# Patient Record
Sex: Male | Born: 1960 | Race: White | Hispanic: No | Marital: Married | Smoking: Former smoker
Health system: Southern US, Community
[De-identification: ages and names within clinical notes are randomized; demographics above are authoritative.]

## PROBLEM LIST (undated history)

## (undated) DIAGNOSIS — C159 Malignant neoplasm of esophagus, unspecified: Secondary | ICD-10-CM

## (undated) DIAGNOSIS — C760 Malignant neoplasm of head, face and neck: Secondary | ICD-10-CM

## (undated) DIAGNOSIS — I10 Essential (primary) hypertension: Secondary | ICD-10-CM

## (undated) HISTORY — PX: BACK SURGERY: SHX140

## (undated) HISTORY — PX: FOOT SURGERY: SHX648

## (undated) HISTORY — PX: LEG SURGERY: SHX1003

## (undated) HISTORY — DX: Malignant neoplasm of esophagus, unspecified: C15.9

## (undated) HISTORY — DX: Malignant neoplasm of head, face and neck: C76.0

---

## 1997-11-10 ENCOUNTER — Encounter: Admission: RE | Admit: 1997-11-10 | Discharge: 1998-02-08 | Payer: Self-pay | Admitting: Anesthesiology

## 2001-04-13 ENCOUNTER — Encounter: Payer: Self-pay | Admitting: Specialist

## 2001-04-13 ENCOUNTER — Encounter: Admission: RE | Admit: 2001-04-13 | Discharge: 2001-04-13 | Payer: Self-pay | Admitting: Specialist

## 2007-05-18 ENCOUNTER — Inpatient Hospital Stay (HOSPITAL_COMMUNITY): Admission: RE | Admit: 2007-05-18 | Discharge: 2007-05-23 | Payer: Self-pay | Admitting: Neurosurgery

## 2010-08-06 NOTE — Op Note (Signed)
NAMESEBASTIN, PERLMUTTER                 ACCOUNT NO.:  [id number]   MEDICAL RECORD NO.:  [id number]          PATIENT TYPE:  INP   LOCATION:  2899                         FACILITY:  [facility]   PHYSICIAN:  Danae Orleans. Venetia Maxon, M.D.  DATE OF BIRTH:  1960-04-11   DATE OF PROCEDURE:  05/18/2007  DATE OF DISCHARGE:                               OPERATIVE REPORT   PREOPERATIVE DIAGNOSES:  1. Herniated lumbar disk.  2. Spondylosis.  3. Degenerative disk disease.  4. Radiculopathy of L5-S1 level.   POSTOPERATIVE DIAGNOSES:  1. Herniated lumbar disk.  2. Spondylosis.  3. Degenerative disk disease.  4. Radiculopathy of L5-S1 level.   PROCEDURES PERFORMED:  1. Bilateral L5 laminectomies.  2. Diskectomy with posterior lumbar interbody fusion with PEEK      interbody cages, morcellized autograft FortrOss bone graft extender      and bone morphogenic protein.  3. Pedicle screw fixation (nonsegmental L5- S1 bilaterally).  4. Posterolateral arthrodesis with morcellized bone autograft,      FortrOss and bone morphogenic protein.   SURGEON:  Danae Orleans. Venetia Maxon, M.D.   ASSISTANT:  Georgiann Cocker, RN  Stefani Dama, M.D.   ANESTHESIA:  General endotracheal.   ESTIMATED BLOOD LOSS:  220 mL.   COMPLICATIONS:  None.   DISPOSITION:  To recovery.   INDICATIONS:  Newman Waren is a 50 year old man with intractable back and  bilateral lower extremity pain, right greater than left.  He has disk  herniation at L5-S1 with biforaminal spondylitic foraminal stenosis,  causing L5 radiculopathies.  It was elected to take him to surgery for  decompression and fusion at the L5-S1 level.   PROCEDURE:  Mr Decatur was brought to the operating room.  Following  satisfactory and uncomplicated induction of general endotracheal  anesthesia, we placed the intravenous lines and the Foley catheter.  The  patient was then placed in a prone position on the [place] table.  His  low back was then prepped and draped in the usual  sterile fashion.  The  area of planned incision was infiltrated with 0.2% Marcaine and 0.5%  lidocaine with 1:200,000 epinephrine.  The incision was made in the  midline and carried through to the lumbodorsal fascia; which was incised  bilaterally.  Subperiosteal dissection was performed, exposing the L5  through S1 interspace.  Marker probes were placed overlying the  transverse processes at L5 and the sacral alae bilaterally.  Self-  retaining retractor was placed to facilitate exposure; intraoperative x-  ray confirmed correct level.  Subsequently,  hemi-semi laminectomies of  L5 were then performed with a high-speed drill, included with Kerrison  rongeurs and Leksell rongeur; with removal of the inferior articular  facet of L5 bilaterally, and with laminectomies at L5.  The L5 nerve  roots were decompressed, as was the common dural tube; and the L5-S1  interspace was identified.  Then using a D'Errico nerve root retractor  to facilitate exposure, the interspace at L5-S1 was incised.  Disk  material was removed in piecemeal fashion.  Sequential spacers were then  placed up to a level of 11.  The right side was then taken down more  aggressively with a variety of endplate preparation curettes.  Then,  subsequently after trial sizing an 11-mm, PEEK interbody cages were  selected, packed with bone morphogenic protein along with reconstituted  FortrOss 10 mL.  These implants were then inserted in the interspace,  after a half a piece of small BMP kit was then was placed in the  interspace, covered with the FortrOss.  Then a similarly-sized cage was  placed in the left side of midline, packed in similar fashion and bone  autograft was packed overlying these implants.  After confirmation on  lateral fluoroscopy, pedicle screws were placed 50 mm x 6.5 mm at L5 and  S1 bilaterally; there was good purchase and positioning of the screws,  with no cutouts.  The 45 mm screws were used at the level  of the sacrum  on the left, because there did appear to be bicortical purchase up to 45  mm.  Subsequently, 15 mm preload rods were placed and locked down in  situ.  Prior to doing so, posterolateral region was decorticated on the  right side of the midline;  half a piece of the remaining small BMP kit  was placed, and the remaining FortrOss bone autograft was placed  overlying this.  It was tamped into position, similarly with FortrOss  and autograft being placed on the left side of midline and tamped into  position.  The wound had been irrigated prior to this bone graft.  Self-  retaining retractors were removed.  The lumbodorsal fascia was closed  with #1 Vicryl suture.  Subcutaneous tissues were approximated with 2-0  Vicryl inverted sutures, and the skin edges were approximated with  interrupted 3-0 Vicryl subcuticular stitch.  The wound was dressed with  Benzoin, Steri-Strips, Telfa gauze and tape.  The patient was extubated  in the operating room and taken to recovery room in stable satisfactory  condition, having tolerated the operation well.  Counts were correct at  the end of the case.      Danae Orleans. Venetia Maxon, M.D.  Electronically Signed     JDS/MEDQ  D:  05/18/2007  T:  05/19/2007  Job:  [id number]

## 2010-08-06 NOTE — Discharge Summary (Signed)
NAMEKIRK, BASQUEZ                 ACCOUNT NO.:  [id number]   MEDICAL RECORD NO.:  [id number]          PATIENT TYPE:  INP   LOCATION:  3036                         FACILITY:  [facility]   PHYSICIAN:  Stefani Dama, M.D.  DATE OF BIRTH:  Jun 18, 1960   DATE OF ADMISSION:  05/18/2007  DATE OF DISCHARGE:  05/23/2007                               DISCHARGE SUMMARY   ADMITTING DIAGNOSES:  1. Herniated nucleus pulposus lumbar spine.  2. Spondylosis.  3. Degenerative disk disease.  4. Radiculopathy of L5 and S1 nerve roots.   DISCHARGE AND FINAL DIAGNOSES:  1. Herniated nucleus pulposus lumbar.  2. Spondylosis.  3. Degenerative disk disease.  4. Radiculopathy of L5-S1 nerve roots.  5. Postoperative ileus.   CONDITION ON DISCHARGE:  Improving.   HOSPITAL COURSE:  Brandt Chaney is a 50 year old individual was had  significant back pain, lower extremity pain, is failing all forms of  conservative management and he was advised regarding decompression  arthrodesis which was performed on May 18, 2007.  Postoperatively,  he has been slow to ambulate but gradually increased his ambulatory  status.  He had some increasing back pain, complained of abdominal  distention, was found to have postoperative ileus.  This gradually  resolved with the use of some mild laxatives and encouragement of  ambulation.  At the time of discharge, the patient was using Percocet  for pain control.  In addition, he is given a prescription for Valium 5  mg to use every 8 hours, Percocet #60 is written, Valium 5 mg #40 is  written.  His incision is clean and dry.  He has been advised of his  postoperative care.  He will be seen by Dr. Maeola Harman in  approximately two weeks' time.   CONDITION ON DISCHARGE:  Improving.      Stefani Dama, M.D.  Electronically Signed     HJE/MEDQ  D:  05/23/2007  T:  05/24/2007  Job:  [id number]

## 2010-12-16 LAB — URINALYSIS, ROUTINE W REFLEX MICROSCOPIC
Nitrite: NEGATIVE
Specific Gravity, Urine: 1.015
pH: 5.5

## 2010-12-16 LAB — BASIC METABOLIC PANEL
BUN: 10
Calcium: 9.2
Creatinine, Ser: 0.76
GFR calc non Af Amer: >60
Potassium: 4

## 2010-12-16 LAB — DIFFERENTIAL
Basophils Absolute: 0.1
Lymphocytes Relative: 19
Monocytes Absolute: 1.2 — ABNORMAL HIGH
Monocytes Relative: 8
Neutro Abs: 11.1 — ABNORMAL HIGH
Neutrophils Relative %: 71

## 2010-12-16 LAB — CBC
Hemoglobin: 13.4
Platelets: 194
RBC: 4.26
RDW: 11.8
WBC: 7.2

## 2011-08-24 ENCOUNTER — Emergency Department (HOSPITAL_COMMUNITY)
Admission: EM | Admit: 2011-08-24 | Discharge: 2011-08-24 | Disposition: A | Discharge status: Home / Self Care | Payer: Self-pay | Attending: Emergency Medicine | Admitting: Emergency Medicine

## 2011-08-24 ENCOUNTER — Emergency Department (HOSPITAL_COMMUNITY): Payer: Self-pay

## 2011-08-24 ENCOUNTER — Encounter (HOSPITAL_COMMUNITY): Payer: Self-pay | Admitting: Emergency Medicine

## 2011-08-24 DIAGNOSIS — R079 Chest pain, unspecified: Secondary | ICD-10-CM | POA: Insufficient documentation

## 2011-08-24 DIAGNOSIS — R0781 Pleurodynia: Secondary | ICD-10-CM

## 2011-08-24 DIAGNOSIS — I10 Essential (primary) hypertension: Secondary | ICD-10-CM | POA: Insufficient documentation

## 2011-08-24 DIAGNOSIS — W108XXA Fall (on) (from) other stairs and steps, initial encounter: Secondary | ICD-10-CM | POA: Insufficient documentation

## 2011-08-24 HISTORY — DX: Essential (primary) hypertension: I10

## 2011-08-24 LAB — POCT I-STAT, CHEM 8
BUN: 9 mg/dL (ref 6–23)
Calcium, Ion: 1.19 mmol/L (ref 1.12–1.32)
Chloride: 104 mEq/L (ref 96–112)
Potassium: 3.9 mEq/L (ref 3.5–5.1)

## 2011-08-24 MED ORDER — OXYCODONE-ACETAMINOPHEN 5-325 MG PO TABS
2.0000 | ORAL_TABLET | Freq: Once | ORAL | Status: DC
  Filled 2011-08-24: qty 2

## 2011-08-24 MED ORDER — OXYCODONE-ACETAMINOPHEN 5-325 MG PO TABS: 1.0000 | ORAL_TABLET | Freq: Four times a day (QID) | ORAL | Status: AC | PRN

## 2011-08-24 NOTE — ED Notes (Addendum)
Pt states he fell down approx 12-14 concrete steps 2 weeks ago and seen at [facility] ED.  C/o continued pain to chest, abd, and ribs with sob. Pain worse with movement.

## 2011-08-24 NOTE — ED Notes (Signed)
Patient transported to X-ray 

## 2011-08-24 NOTE — ED Notes (Signed)
Pt is on the monitor.

## 2011-08-24 NOTE — ED Notes (Signed)
Patient given discharge paperwork; went over discharge instructions with patient.  Patient instructed to take Percocet as directed, to not drive while taking Percocet, to follow up with referral/primary care physician, and to return to the ED for new, worsening, or concerning symptoms.

## 2011-08-24 NOTE — ED Notes (Signed)
I gave the patient a cup of ice, a coke, and a bag of pretzels.

## 2011-08-24 NOTE — ED Provider Notes (Addendum)
History     CSN: [id number]  Arrival date & time 08/24/11  1747   First MD Initiated Contact with Patient 08/24/11 1807      Chief Complaint  Patient presents with  . Chest Pain    (Consider location/radiation/quality/duration/timing/severity/associated sxs/prior treatment) HPI Comments: Patient reports that two weeks ago he fell while walking down concrete stairs.  He is unable to give details of the fall.  However, he reports that he has been having pain of his anterior ribs bilaterally and his abdomen since that time.  After the fall he was seen at [facility] Emergency Department and reports that he had an xray done of his ribs and chest.  He reports that the xrays were negative.  He was given Percocet for pain, which helped somewhat.  He ran out of the Percocet one week ago.  He reports that the pain is worse with movement and worse with taking deep breaths.    The history is provided by the patient.    Past Medical History  Diagnosis Date  . Hypertension     Past Surgical History  Procedure Date  . Back surgery   . Foot surgery     No family history on file.  History  Substance Use Topics  . Smoking status: Current Everyday Smoker  . Smokeless tobacco: Not on file  . Alcohol Use: No      Review of Systems  Constitutional: Negative for fever and chills.  HENT: Negative for neck pain and neck stiffness.   Respiratory: Negative for cough, shortness of breath and wheezing.   Cardiovascular: Positive for chest pain.  Gastrointestinal: Negative for nausea and vomiting.  Neurological: Negative for dizziness, syncope and light-headedness.    Allergies  Review of patient's allergies indicates no known allergies.  Home Medications   Current Outpatient Rx  Name Route Sig Dispense Refill  . ALBUTEROL SULFATE HFA 108 (90 BASE) MCG/ACT IN AERS Inhalation Inhale 2 puffs into the lungs every 6 (six) hours as needed. For shortness of breath      BP 148/90  Pulse 102   Temp(Src) 98 F (36.7 C) (Oral)  Resp 20  SpO2 98%  Physical Exam  Nursing note and vitals reviewed. Constitutional: He appears well-developed and well-nourished. No distress.  HENT:  Head: Normocephalic and atraumatic.  Mouth/Throat: Oropharynx is clear and moist.  Neck: Normal range of motion. Neck supple.  Cardiovascular: Normal rate, regular rhythm, normal heart sounds and intact distal pulses.   Pulmonary/Chest: Effort normal and breath sounds normal. No respiratory distress. He has no wheezes. He has no rales. He exhibits tenderness.       Inferior aspects of rib cages tender to palpation anteriorly bilaterally  Abdominal: Soft. There is tenderness. There is no rebound and no guarding.  Musculoskeletal: Normal range of motion.  Neurological: He is alert.  Skin: Skin is warm, dry and intact. No abrasion, no bruising and no ecchymosis noted. He is not diaphoretic.  Psychiatric: He has a normal mood and affect.    ED Course  Procedures (including critical care time)  Labs Reviewed - No data to display No results found.   No diagnosis found.   Date: 10/28/2011  Rate: 104  Rhythm: sinus tachycardia  QRS Axis: normal  Intervals: normal  ST/T Wave abnormalities: normal  Conduction Disutrbances:none  Narrative Interpretation:   Old EKG Reviewed: unchanged    MDM  Patient comes in with rib pain 2 weeks after a fall.  He had been previously evaluated  at [facility] Emergency Department and had reported negative xrays after the fall.  He reports that his pain is not improving.  Xrays done today do not show any acute rib fracture. Patient given prescription for pain medication and instructed to follow up with PCP.  Return precautions discussed.        Pascal Lux Essig, PA-C 08/25/11 0206  Magnus Sinning, PA-C 10/28/11 1023

## 2011-08-24 NOTE — ED Notes (Signed)
Patient states he fell down 12-14 concrete steps two weeks ago and was seen [facility] and was discharged home with diagnosis of brusing and no broken bones. Patient c/o of rib cage and back and abdomen and pelvic pain with SOB at times. Patient states he cannot lay on his sides to sleep.  Patient placed on monitor and sats of 99% RA.

## 2011-08-24 NOTE — ED Provider Notes (Signed)
52 year old male fell 2 weeks ago injuring both lower rib cage his. He is continuing to have pain. He did get relief with Vicodin at home but he had not taken any for about the last 5 days. Exam shows tenderness of the arm inferior aspects of both rib cages laterally. There is also some soft tissue tenderness in the area just inferior to the rib cages. The abdomen is soft and without any rebound or guarding. He does appear to be uncomfortable. X-rays will be repeated since rib fractures can frequently be occult on initial x-rays, but he will need to be treated symptomatically even if rib fracture is identified.  Dione Booze, MD 08/24/11 [phone]

## 2011-08-28 NOTE — ED Provider Notes (Signed)
Medical screening examination/treatment/procedure(s) were conducted as a shared visit with non-physician practitioner(s) and myself.  I personally evaluated the patient during the encounter   Dione Booze, MD 08/28/11 [phone]

## 2011-10-28 NOTE — ED Provider Notes (Signed)
Medical screening examination/treatment/procedure(s) were performed by non-physician practitioner and as supervising physician I was immediately available for consultation/collaboration.   Dione Booze, MD 10/28/11 [phone]

## 2016-12-04 DIAGNOSIS — C101 Malignant neoplasm of anterior surface of epiglottis: Secondary | ICD-10-CM | POA: Diagnosis not present

## 2016-12-04 DIAGNOSIS — F17218 Nicotine dependence, cigarettes, with other nicotine-induced disorders: Secondary | ICD-10-CM | POA: Diagnosis not present

## 2018-01-31 DIAGNOSIS — J189 Pneumonia, unspecified organism: Secondary | ICD-10-CM

## 2018-01-31 DIAGNOSIS — N390 Urinary tract infection, site not specified: Secondary | ICD-10-CM

## 2018-01-31 DIAGNOSIS — W19XXXA Unspecified fall, initial encounter: Secondary | ICD-10-CM | POA: Diagnosis not present

## 2018-01-31 DIAGNOSIS — S39012A Strain of muscle, fascia and tendon of lower back, initial encounter: Secondary | ICD-10-CM | POA: Diagnosis not present

## 2018-02-01 DIAGNOSIS — N390 Urinary tract infection, site not specified: Secondary | ICD-10-CM | POA: Diagnosis not present

## 2018-02-01 DIAGNOSIS — W19XXXA Unspecified fall, initial encounter: Secondary | ICD-10-CM | POA: Diagnosis not present

## 2018-02-01 DIAGNOSIS — S39012A Strain of muscle, fascia and tendon of lower back, initial encounter: Secondary | ICD-10-CM | POA: Diagnosis not present

## 2018-02-01 DIAGNOSIS — J189 Pneumonia, unspecified organism: Secondary | ICD-10-CM | POA: Diagnosis not present

## 2018-02-02 DIAGNOSIS — J189 Pneumonia, unspecified organism: Secondary | ICD-10-CM | POA: Diagnosis not present

## 2018-02-02 DIAGNOSIS — W19XXXA Unspecified fall, initial encounter: Secondary | ICD-10-CM | POA: Diagnosis not present

## 2018-02-02 DIAGNOSIS — N390 Urinary tract infection, site not specified: Secondary | ICD-10-CM | POA: Diagnosis not present

## 2018-02-02 DIAGNOSIS — S39012A Strain of muscle, fascia and tendon of lower back, initial encounter: Secondary | ICD-10-CM | POA: Diagnosis not present

## 2018-02-03 DIAGNOSIS — N390 Urinary tract infection, site not specified: Secondary | ICD-10-CM | POA: Diagnosis not present

## 2018-02-03 DIAGNOSIS — S39012A Strain of muscle, fascia and tendon of lower back, initial encounter: Secondary | ICD-10-CM | POA: Diagnosis not present

## 2018-02-03 DIAGNOSIS — J189 Pneumonia, unspecified organism: Secondary | ICD-10-CM | POA: Diagnosis not present

## 2018-02-03 DIAGNOSIS — W19XXXA Unspecified fall, initial encounter: Secondary | ICD-10-CM | POA: Diagnosis not present

## 2018-02-04 DIAGNOSIS — J189 Pneumonia, unspecified organism: Secondary | ICD-10-CM | POA: Diagnosis not present

## 2018-02-04 DIAGNOSIS — S39012A Strain of muscle, fascia and tendon of lower back, initial encounter: Secondary | ICD-10-CM | POA: Diagnosis not present

## 2018-02-04 DIAGNOSIS — W19XXXA Unspecified fall, initial encounter: Secondary | ICD-10-CM | POA: Diagnosis not present

## 2018-02-04 DIAGNOSIS — N390 Urinary tract infection, site not specified: Secondary | ICD-10-CM | POA: Diagnosis not present

## 2018-02-05 DIAGNOSIS — S39012A Strain of muscle, fascia and tendon of lower back, initial encounter: Secondary | ICD-10-CM | POA: Diagnosis not present

## 2018-02-05 DIAGNOSIS — W19XXXA Unspecified fall, initial encounter: Secondary | ICD-10-CM | POA: Diagnosis not present

## 2018-02-05 DIAGNOSIS — N390 Urinary tract infection, site not specified: Secondary | ICD-10-CM | POA: Diagnosis not present

## 2018-02-05 DIAGNOSIS — J189 Pneumonia, unspecified organism: Secondary | ICD-10-CM | POA: Diagnosis not present

## 2018-03-10 ENCOUNTER — Emergency Department (HOSPITAL_COMMUNITY): Payer: Medicaid Other

## 2018-03-10 ENCOUNTER — Other Ambulatory Visit: Payer: Self-pay

## 2018-03-10 ENCOUNTER — Encounter (HOSPITAL_COMMUNITY): Payer: Self-pay | Admitting: Emergency Medicine

## 2018-03-10 ENCOUNTER — Inpatient Hospital Stay (HOSPITAL_COMMUNITY)
Admission: EM | Admit: 2018-03-10 | Discharge: 2018-03-15 | DRG: 871 | Disposition: A | Discharge status: Home Health | Payer: Medicaid Other | Attending: Internal Medicine | Admitting: Internal Medicine

## 2018-03-10 DIAGNOSIS — Z923 Personal history of irradiation: Secondary | ICD-10-CM

## 2018-03-10 DIAGNOSIS — E860 Dehydration: Secondary | ICD-10-CM | POA: Diagnosis present

## 2018-03-10 DIAGNOSIS — J9622 Acute and chronic respiratory failure with hypercapnia: Secondary | ICD-10-CM | POA: Diagnosis present

## 2018-03-10 DIAGNOSIS — Z9981 Dependence on supplemental oxygen: Secondary | ICD-10-CM | POA: Diagnosis not present

## 2018-03-10 DIAGNOSIS — I959 Hypotension, unspecified: Secondary | ICD-10-CM | POA: Diagnosis present

## 2018-03-10 DIAGNOSIS — J439 Emphysema, unspecified: Secondary | ICD-10-CM | POA: Diagnosis not present

## 2018-03-10 DIAGNOSIS — E871 Hypo-osmolality and hyponatremia: Secondary | ICD-10-CM | POA: Diagnosis present

## 2018-03-10 DIAGNOSIS — Z85819 Personal history of malignant neoplasm of unspecified site of lip, oral cavity, and pharynx: Secondary | ICD-10-CM | POA: Diagnosis not present

## 2018-03-10 DIAGNOSIS — K5900 Constipation, unspecified: Secondary | ICD-10-CM | POA: Diagnosis not present

## 2018-03-10 DIAGNOSIS — Z7189 Other specified counseling: Secondary | ICD-10-CM

## 2018-03-10 DIAGNOSIS — I5189 Other ill-defined heart diseases: Secondary | ICD-10-CM | POA: Diagnosis not present

## 2018-03-10 DIAGNOSIS — Z8501 Personal history of malignant neoplasm of esophagus: Secondary | ICD-10-CM

## 2018-03-10 DIAGNOSIS — Z515 Encounter for palliative care: Secondary | ICD-10-CM

## 2018-03-10 DIAGNOSIS — R6520 Severe sepsis without septic shock: Secondary | ICD-10-CM | POA: Diagnosis present

## 2018-03-10 DIAGNOSIS — R1313 Dysphagia, pharyngeal phase: Secondary | ICD-10-CM | POA: Diagnosis present

## 2018-03-10 DIAGNOSIS — G8929 Other chronic pain: Secondary | ICD-10-CM | POA: Diagnosis present

## 2018-03-10 DIAGNOSIS — Y842 Radiological procedure and radiotherapy as the cause of abnormal reaction of the patient, or of later complication, without mention of misadventure at the time of the procedure: Secondary | ICD-10-CM | POA: Diagnosis present

## 2018-03-10 DIAGNOSIS — E876 Hypokalemia: Secondary | ICD-10-CM | POA: Diagnosis present

## 2018-03-10 DIAGNOSIS — R740 Nonspecific elevation of levels of transaminase and lactic acid dehydrogenase [LDH]: Secondary | ICD-10-CM

## 2018-03-10 DIAGNOSIS — E43 Unspecified severe protein-calorie malnutrition: Secondary | ICD-10-CM | POA: Diagnosis present

## 2018-03-10 DIAGNOSIS — Z681 Body mass index (BMI) 19 or less, adult: Secondary | ICD-10-CM | POA: Diagnosis not present

## 2018-03-10 DIAGNOSIS — K224 Dyskinesia of esophagus: Secondary | ICD-10-CM | POA: Diagnosis present

## 2018-03-10 DIAGNOSIS — Z79891 Long term (current) use of opiate analgesic: Secondary | ICD-10-CM

## 2018-03-10 DIAGNOSIS — M549 Dorsalgia, unspecified: Secondary | ICD-10-CM | POA: Diagnosis present

## 2018-03-10 DIAGNOSIS — F17201 Nicotine dependence, unspecified, in remission: Secondary | ICD-10-CM

## 2018-03-10 DIAGNOSIS — J690 Pneumonitis due to inhalation of food and vomit: Secondary | ICD-10-CM | POA: Diagnosis present

## 2018-03-10 DIAGNOSIS — I311 Chronic constrictive pericarditis: Secondary | ICD-10-CM | POA: Diagnosis present

## 2018-03-10 DIAGNOSIS — J9621 Acute and chronic respiratory failure with hypoxia: Secondary | ICD-10-CM | POA: Diagnosis present

## 2018-03-10 DIAGNOSIS — J189 Pneumonia, unspecified organism: Secondary | ICD-10-CM | POA: Diagnosis not present

## 2018-03-10 DIAGNOSIS — Z7951 Long term (current) use of inhaled steroids: Secondary | ICD-10-CM

## 2018-03-10 DIAGNOSIS — R0602 Shortness of breath: Secondary | ICD-10-CM | POA: Diagnosis not present

## 2018-03-10 DIAGNOSIS — Z66 Do not resuscitate: Secondary | ICD-10-CM | POA: Diagnosis present

## 2018-03-10 DIAGNOSIS — Z79899 Other long term (current) drug therapy: Secondary | ICD-10-CM

## 2018-03-10 DIAGNOSIS — E875 Hyperkalemia: Secondary | ICD-10-CM | POA: Diagnosis present

## 2018-03-10 DIAGNOSIS — I21A1 Myocardial infarction type 2: Secondary | ICD-10-CM | POA: Diagnosis present

## 2018-03-10 DIAGNOSIS — R1312 Dysphagia, oropharyngeal phase: Secondary | ICD-10-CM | POA: Diagnosis not present

## 2018-03-10 DIAGNOSIS — A419 Sepsis, unspecified organism: Secondary | ICD-10-CM | POA: Diagnosis present

## 2018-03-10 DIAGNOSIS — Z87891 Personal history of nicotine dependence: Secondary | ICD-10-CM

## 2018-03-10 DIAGNOSIS — J449 Chronic obstructive pulmonary disease, unspecified: Secondary | ICD-10-CM | POA: Diagnosis present

## 2018-03-10 DIAGNOSIS — J069 Acute upper respiratory infection, unspecified: Secondary | ICD-10-CM | POA: Diagnosis present

## 2018-03-10 DIAGNOSIS — I214 Non-ST elevation (NSTEMI) myocardial infarction: Secondary | ICD-10-CM

## 2018-03-10 DIAGNOSIS — R778 Other specified abnormalities of plasma proteins: Secondary | ICD-10-CM | POA: Diagnosis present

## 2018-03-10 DIAGNOSIS — R7401 Elevation of levels of liver transaminase levels: Secondary | ICD-10-CM | POA: Diagnosis present

## 2018-03-10 DIAGNOSIS — R1310 Dysphagia, unspecified: Secondary | ICD-10-CM

## 2018-03-10 DIAGNOSIS — I10 Essential (primary) hypertension: Secondary | ICD-10-CM | POA: Diagnosis present

## 2018-03-10 DIAGNOSIS — J9620 Acute and chronic respiratory failure, unspecified whether with hypoxia or hypercapnia: Secondary | ICD-10-CM | POA: Diagnosis present

## 2018-03-10 DIAGNOSIS — R7989 Other specified abnormal findings of blood chemistry: Secondary | ICD-10-CM | POA: Diagnosis present

## 2018-03-10 DIAGNOSIS — R918 Other nonspecific abnormal finding of lung field: Secondary | ICD-10-CM | POA: Diagnosis not present

## 2018-03-10 LAB — BASIC METABOLIC PANEL
Anion gap: 9 (ref 5–15)
BUN: 32 mg/dL — ABNORMAL HIGH (ref 6–20)
CO2: 31 mmol/L (ref 22–32)
Calcium: 8 mg/dL — ABNORMAL LOW (ref 8.9–10.3)
Chloride: 88 mmol/L — ABNORMAL LOW (ref 98–111)
Creatinine, Ser: 0.82 mg/dL (ref 0.61–1.24)
GFR calc Af Amer: >60 mL/min (ref >60)
GFR calc non Af Amer: >60 mL/min (ref >60)
Glucose, Bld: 122 mg/dL — ABNORMAL HIGH (ref 70–99)
Potassium: 4.4 mmol/L (ref 3.5–5.1)
Sodium: 128 mmol/L — ABNORMAL LOW (ref 135–145)

## 2018-03-10 LAB — CBC WITH DIFFERENTIAL/PLATELET
Abs Immature Granulocytes: 0 K/uL (ref 0.00–0.07)
Band Neutrophils: 1 %
Basophils Absolute: 0 K/uL (ref 0.0–0.1)
Basophils Relative: 0 %
Eosinophils Absolute: 0 K/uL (ref 0.0–0.5)
Eosinophils Relative: 0 %
HCT: 37.1 % — ABNORMAL LOW (ref 39.0–52.0)
Hemoglobin: 11.4 g/dL — ABNORMAL LOW (ref 13.0–17.0)
Lymphocytes Relative: 3 %
Lymphs Abs: 0.7 K/uL (ref 0.7–4.0)
MCH: 28.7 pg (ref 26.0–34.0)
MCHC: 30.7 g/dL (ref 30.0–36.0)
MCV: 93.5 fL (ref 80.0–100.0)
Monocytes Absolute: 0.2 K/uL (ref 0.1–1.0)
Monocytes Relative: 1 %
Neutro Abs: 22.8 K/uL — ABNORMAL HIGH (ref 1.7–7.7)
Neutrophils Relative %: 95 %
Platelets: 269 K/uL (ref 150–400)
RBC: 3.97 MIL/uL — ABNORMAL LOW (ref 4.22–5.81)
RDW: 12.1 % (ref 11.5–15.5)
WBC: 23.7 K/uL — ABNORMAL HIGH (ref 4.0–10.5)
nRBC: 0 % (ref 0.0–0.2)
nRBC: 0 /100{WBCs}

## 2018-03-10 LAB — BASIC METABOLIC PANEL WITH GFR
Anion gap: 10 (ref 5–15)
BUN: 37 mg/dL — ABNORMAL HIGH (ref 6–20)
CO2: 33 mmol/L — ABNORMAL HIGH (ref 22–32)
Calcium: 8.8 mg/dL — ABNORMAL LOW (ref 8.9–10.3)
Chloride: 84 mmol/L — ABNORMAL LOW (ref 98–111)
Creatinine, Ser: 0.98 mg/dL (ref 0.61–1.24)
GFR calc Af Amer: >60 mL/min
GFR calc non Af Amer: >60 mL/min
Glucose, Bld: 123 mg/dL — ABNORMAL HIGH (ref 70–99)
Potassium: 5.5 mmol/L — ABNORMAL HIGH (ref 3.5–5.1)
Sodium: 127 mmol/L — ABNORMAL LOW (ref 135–145)

## 2018-03-10 LAB — URINALYSIS, ROUTINE W REFLEX MICROSCOPIC
Bilirubin Urine: NEGATIVE
Glucose, UA: NEGATIVE mg/dL
Ketones, ur: NEGATIVE mg/dL
Leukocytes, UA: NEGATIVE
Nitrite: NEGATIVE
Protein, ur: NEGATIVE mg/dL
Specific Gravity, Urine: 1.019 (ref 1.005–1.030)
pH: 5 (ref 5.0–8.0)

## 2018-03-10 LAB — TROPONIN I: Troponin I: 0.9 ng/mL (ref <0.03)

## 2018-03-10 LAB — I-STAT TROPONIN, ED: Troponin i, poc: 0.84 ng/mL (ref 0.00–0.08)

## 2018-03-10 LAB — I-STAT CG4 LACTIC ACID, ED: Lactic Acid, Venous: 1.71 mmol/L (ref 0.5–1.9)

## 2018-03-10 LAB — BRAIN NATRIURETIC PEPTIDE: B Natriuretic Peptide: 693.7 pg/mL — ABNORMAL HIGH (ref 0.0–100.0)

## 2018-03-10 MED ORDER — IPRATROPIUM-ALBUTEROL 0.5-2.5 (3) MG/3ML IN SOLN
3.0000 mL | Freq: Two times a day (BID) | RESPIRATORY_TRACT | Status: DC
  Administered 2018-03-10 – 2018-03-11 (×2): 3 mL via RESPIRATORY_TRACT
  Filled 2018-03-10 (×2): qty 3

## 2018-03-10 MED ORDER — ORAL CARE MOUTH RINSE
15.0000 mL | Freq: Two times a day (BID) | OROMUCOSAL | Status: DC
  Administered 2018-03-11 – 2018-03-14 (×7): 15 mL via OROMUCOSAL

## 2018-03-10 MED ORDER — IOPAMIDOL (ISOVUE-370) INJECTION 76%
INTRAVENOUS | Status: AC
  Administered 2018-03-10: 100 mL
  Filled 2018-03-10: qty 100

## 2018-03-10 MED ORDER — ALBUTEROL SULFATE (2.5 MG/3ML) 0.083% IN NEBU
10.0000 mg | INHALATION_SOLUTION | Freq: Once | RESPIRATORY_TRACT | Status: AC
  Administered 2018-03-10: 10 mg via RESPIRATORY_TRACT
  Filled 2018-03-10: qty 12

## 2018-03-10 MED ORDER — VANCOMYCIN HCL IN DEXTROSE 1-5 GM/200ML-% IV SOLN
1000.0000 mg | Freq: Once | INTRAVENOUS | Status: AC
  Administered 2018-03-10: 1000 mg via INTRAVENOUS
  Filled 2018-03-10: qty 200

## 2018-03-10 MED ORDER — SODIUM CHLORIDE 0.9 % IV BOLUS
500.0000 mL | Freq: Once | INTRAVENOUS | Status: AC
  Administered 2018-03-10: 500 mL via INTRAVENOUS

## 2018-03-10 MED ORDER — ENOXAPARIN SODIUM 40 MG/0.4ML ~~LOC~~ SOLN
40.0000 mg | SUBCUTANEOUS | Status: DC
  Administered 2018-03-10 – 2018-03-14 (×5): 40 mg via SUBCUTANEOUS
  Filled 2018-03-10 (×5): qty 0.4

## 2018-03-10 MED ORDER — SODIUM CHLORIDE 0.9 % IV SOLN
1.0000 g | Freq: Once | INTRAVENOUS | Status: AC
  Administered 2018-03-10: 1 g via INTRAVENOUS
  Filled 2018-03-10: qty 1

## 2018-03-10 MED ORDER — METRONIDAZOLE IN NACL 5-0.79 MG/ML-% IV SOLN
500.0000 mg | Freq: Three times a day (TID) | INTRAVENOUS | Status: DC
  Administered 2018-03-11 (×2): 500 mg via INTRAVENOUS
  Filled 2018-03-10 (×2): qty 100

## 2018-03-10 MED ORDER — ENSURE ENLIVE PO LIQD: 237.0000 mL | Freq: Two times a day (BID) | ORAL | Status: DC

## 2018-03-10 MED ORDER — HEPARIN BOLUS VIA INFUSION
3000.0000 [IU] | Freq: Once | INTRAVENOUS | Status: AC
  Administered 2018-03-10: 3000 [IU] via INTRAVENOUS
  Filled 2018-03-10: qty 3000

## 2018-03-10 MED ORDER — METRONIDAZOLE IN NACL 5-0.79 MG/ML-% IV SOLN
500.0000 mg | Freq: Once | INTRAVENOUS | Status: AC
  Administered 2018-03-10: 500 mg via INTRAVENOUS
  Filled 2018-03-10: qty 100

## 2018-03-10 MED ORDER — MAGIC MOUTHWASH W/LIDOCAINE
5.0000 mL | Freq: Three times a day (TID) | ORAL | Status: DC
  Administered 2018-03-10 – 2018-03-14 (×12): 5 mL via ORAL
  Filled 2018-03-10 (×16): qty 5

## 2018-03-10 MED ORDER — ASPIRIN 81 MG PO CHEW
81.0000 mg | CHEWABLE_TABLET | Freq: Every day | ORAL | Status: DC
  Administered 2018-03-12 – 2018-03-15 (×4): 81 mg via ORAL
  Filled 2018-03-10 (×4): qty 1

## 2018-03-10 MED ORDER — HEPARIN (PORCINE) 25000 UT/250ML-% IV SOLN
800.0000 [IU]/h | INTRAVENOUS | Status: DC
  Administered 2018-03-10: 800 [IU]/h via INTRAVENOUS
  Filled 2018-03-10: qty 250

## 2018-03-10 MED ORDER — VANCOMYCIN HCL IN DEXTROSE 750-5 MG/150ML-% IV SOLN
750.0000 mg | INTRAVENOUS | Status: DC
  Filled 2018-03-10: qty 150

## 2018-03-10 MED ORDER — METHADONE HCL 10 MG PO TABS: 115.0000 mg | ORAL_TABLET | Freq: Every day | ORAL | Status: DC

## 2018-03-10 MED ORDER — SODIUM CHLORIDE 0.9 % IV SOLN
1.0000 g | Freq: Three times a day (TID) | INTRAVENOUS | Status: DC
  Administered 2018-03-11 – 2018-03-15 (×14): 1 g via INTRAVENOUS
  Filled 2018-03-10 (×16): qty 1

## 2018-03-10 MED ORDER — ASPIRIN 81 MG PO CHEW: 81.0000 mg | CHEWABLE_TABLET | Freq: Every day | ORAL | Status: DC

## 2018-03-10 MED ORDER — SODIUM CHLORIDE 0.9 % IV BOLUS
1000.0000 mL | Freq: Once | INTRAVENOUS | Status: AC
  Administered 2018-03-10: 1000 mL via INTRAVENOUS

## 2018-03-10 MED ORDER — SODIUM CHLORIDE 0.9 % IV SOLN
INTRAVENOUS | Status: AC
  Administered 2018-03-10: 21:00:00 via INTRAVENOUS

## 2018-03-10 MED ORDER — INFLUENZA VAC SPLIT QUAD 0.5 ML IM SUSY
0.5000 mL | PREFILLED_SYRINGE | INTRAMUSCULAR | Status: DC
  Filled 2018-03-10: qty 0.5

## 2018-03-10 MED ORDER — ASPIRIN 325 MG PO TABS
325.0000 mg | ORAL_TABLET | Freq: Once | ORAL | Status: AC
  Administered 2018-03-10: 325 mg via ORAL
  Filled 2018-03-10: qty 1

## 2018-03-10 NOTE — H&P (Signed)
Date: 03/10/2018               Patient Name:  Tony Berry MRN: [id number]  DOB: Jul 09, 1960 Age / Sex: 57 y.o., male   PCP: Tony Czech, MD         Medical Service: Internal Medicine Teaching Service         Attending Physician: Dr. Rebeca Alert Raynaldo Opitz, MD    First Contact: Dr. Sharon Berry Pager: [phone]  Second Contact: Dr. Shan Berry Pager: [phone]       After Hours (After 5p/  First Contact Pager: [phone]  weekends / holidays): Second Contact Pager: [phone]   Chief Complaint: SOB  History of Present Illness:  Tony Berry is a 57 yo male with PMH esophageal cancer s/p radiation ending 7 months ago, malnutrition, chronic back pain on methadone with recent admission to [facility] one month ago for pneumonia presenting with SOB and weakness that has been worsening since previous discharge. On visit to room he is hoarse, has difficulty speaking, and is on 5L [place]. He does not use supplemental O2 at home. Some of history was supplied by his brother. He additionally has had worsening difficulty eating and swallowing since radiation, which has been worsened by chronic cough for the past few weeks. He also has mouth sores that make it difficult to eat and drink, he is unsure how long they have been present. He is unable to eat much and feels like he chokes on liquids. He was discharged from [place] with antibiotics for pneumonia, which he states he completed. He states he is able to ambulate but has had more difficulty with his worsening SOB.  He denies chest pain, arm pain, numbness, tingling, jaw pain, changes in urination or bowel movement, no diarrhea. He endorses back pain which is chronic.  He has not seen his doctors for his esophageal cancer in seven months and is unsure when he is supposed to follow-up. He sees pulmonology and has recently been placed on Trelegy. He additionally had four spots found on imaging recently that were supposed to be biopsied but there has been some  difficulty obtaining this due to insurance.    Social:  He recently quit smoking after his admission for pneumonia. He denies drinking He lives at home with his wife, Tony Berry. [phone] r [phone]  Family History:  No family history on file.  Meds:  Current Meds  Medication Sig  . METHADONE HCL PO Take 115 mg by mouth once a week.     Allergies: Allergies as of 03/10/2018  . (No Known Allergies)   Past Medical History:  Diagnosis Date  . Hypertension      Review of Systems: A complete ROS was negative except as per HPI.   Physical Exam: Blood pressure 105/60, pulse 75, temperature (!) 97.5 F (36.4 C), temperature source Oral, resp. rate 10, height 5\' 7"  (1.702 m), weight 49.9 kg, SpO2 94 %.   CT Neck IMPRESSION: 1. Linear non masslike enhancement of superficial supraglottic mucosa as well as low-attenuation mucosal thickening of supraglottic mucosa and epiglottis is stable from prior CT of the neck. Findings likely represent posttreatment changes. No discrete mass identified. Patent airway. 2. Stable cervical lymph nodes. 3. 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 4. Waxing and waning ground-glass nodules in right upper lobe, likely minimal bronchiolitis. 5. Emphysema. Electronically Signed   By: Kristine Garbe M.D.   On: 03/10/2018 16:44  CTA Chest IMPRESSION: 1. Worsening tree-in-bud infiltrates and focal consolidation consistent with multifocal pneumonia, given basilar predominance, aspiration is possible.  Aortic Atherosclerosis (ICD10-I70.0) and Emphysema (ICD10-J43.9). Electronically Signed   By: Elon Alas M.D.   On: 03/10/2018 16:43   EKG: personally reviewed my interpretation is right axis deviation,   CXR: personally reviewed my interpretation is right lower lobe consolidation.   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  Multifocal Aspiration Pneumonia Patient presenting with non-productive cough and SOB with leukocytosis to 23 and hypotension in ED improved with fluids. No lactic acidosis. CT showed multifocal pneumonia possibly concerning for aspiration. With his history of esophageal cancer s/p radiation pt does state he chokes with eating and drinking and is at risk for aspiration. Currently on 5L [place].    - Vanc, cefepime, metro - IVF - am CBC - duo-nebs q12h - admit to step down   Elevated Troponin Troponin .8. No clinical symptoms concerning for ACS. EKG showing right axis deviation. Will trend troponins overnight.   - troponin q6h - asa 325, then 81 qd   Hyponatremia Hyperkalemia Likely secondary to dehydration, possible adrenal insufficiency.   - IVF 75cc/hr + fluid bolus in ED - am cortisol, TSH - am CMP  Esophageal Cancer/Mouth Sores Malnutrition He has had difficulty speaking, eating and swallowing due to sore throat and mouth. 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   - SLP eval - magic mouth wash tid  - nutrition consult   OUD Secondary to chronic back pain for which he has had multiple surgeries.   - cont. 115mg  methadone qd  Diet: regular VTE: heparin in ED, switched to lovenox IVF: 75cc hr NS Code: DNR/DNI  Dispo: Admit patient to Inpatient with expected length of stay greater than 2 midnights.  SignedMarty Heck, DO 03/10/2018, 7:00 PM  Pager: [phone]

## 2018-03-10 NOTE — ED Triage Notes (Signed)
Pt states he was dx with pneumonia 2 weeks ago. Pt voice hoarse. Pt states he is having chills and feels like he still might have pneumonia.

## 2018-03-10 NOTE — Progress Notes (Addendum)
Pharmacy Antibiotic Note  Tony Berry is a 57 y.o. male admitted on 03/10/2018 with pneumonia.  Pharmacy has been consulted for vancomycin and cefepime dosing. Pt is afebrile but WBC is elevated at 23.7. SCr is WNL at 0.98.   Plan: Vancomycin 1gm IV x 1 then 750mg  IV Q24H Cefepime 1gm IV Q8H F/u renal fxn, C&S, clinical status and trough at SS  Height: 5\' 7"  (170.2 cm) Weight: 110 lb (49.9 kg) IBW/kg (Calculated) : 66.1  Temp (24hrs), Avg:97.5 F (36.4 C), Min:97.5 F (36.4 C), Max:97.5 F (36.4 C)  Recent Labs  Lab 03/10/18 1442 03/10/18 1457  WBC 23.7*  --   CREATININE 0.98  --   LATICACIDVEN  --  1.71    Estimated Creatinine Clearance: 58.7 mL/min (by C-G formula based on SCr of 0.98 mg/dL).    No Known Allergies  Antimicrobials this admission: Vanc 12/18>> Cefepime 12/18>>  Dose adjustments this admission: N/A  Microbiology results: Pending  Thank you for allowing pharmacy to be a part of this patient's care.  Tony Berry, Tony Berry 03/10/2018 5:08 PM

## 2018-03-10 NOTE — ED Notes (Signed)
Patient transported to X-ray 

## 2018-03-10 NOTE — Progress Notes (Signed)
Patient arrived to 4E05 from ED. VSS. CCMD called, verified x2. Will continue to monitor and provide care.

## 2018-03-10 NOTE — Progress Notes (Signed)
ANTICOAGULATION CONSULT NOTE - Initial Consult  Pharmacy Consult for heparin Indication: r/o ACS vs PE  No Known Allergies  Patient Measurements: Height: 5\' 7"  (170.2 cm) Weight: 110 lb (49.9 kg) IBW/kg (Calculated) : 66.1 Heparin Dosing Weight: 50kg  Vital Signs: Temp: 97.5 F (36.4 C) (12/18 1443) Temp Source: Oral (12/18 1443) BP: 116/68 (12/18 1500) Pulse Rate: 91 (12/18 1500)  Labs: Recent Labs    03/10/18 1442  HGB 11.4*  HCT 37.1*  PLT 269    CrCl cannot be calculated (Patient's most recent lab result is older than the maximum 21 days allowed.).   Medical History: Past Medical History:  Diagnosis Date  . Hypertension     Medications:  Infusions:  . heparin    . sodium chloride 1,000 mL (03/10/18 1444)    Assessment: 64 yom presented to the ED with SOB with recent pneumonia diagnosis. Troponin elevated. To start IV heparin due to concern for PE vs ACS. Baseline Hgb slightly low and platelets are WNL. He is not on anticoagulation PTA.   Goal of Therapy:  Heparin level 0.3-0.7 units/ml Monitor platelets by anticoagulation protocol: Yes   Plan:  Heparin bolus 3000 units IV x 1 Heparin gtt 800 units/hr Check a 6 hr heparin level Daily heparin level and CBC F/u CT results  Jerric Oyen, [name] 03/10/2018,3:17 PM

## 2018-03-10 NOTE — ED Provider Notes (Signed)
MSE was initiated and I personally evaluated the patient and placed orders (if any) at  2:56 PM on March 10, 2018.  The patient appears stable so that the remainder of the MSE may be completed by another provider.  57 year old male with past medical history of throat cancer status post a completeness of radiation therapy 6 months ago, recent hospitalization at [facility] 3 weeks ago for pneumonia. Patient was discharged home on oral antibiotics which he has since completed. Patient continues to have shortness of breath, hoarseness and wheezing despite his inhaler use. He denies any fevers at home. He has difficulty swelling which she has had since his radiation therapy. On arrival to the ED is hypotensive with blood pressure 80s over 50s. No tachycardia or hypoxia.  Of note, patient reportedly had CT chest done 4 months ago by his oncologist which showed "new lung masses". They have yet to be biopsied.  Concern for possible sepsis secondary to pneumonia versus pulmonary embolism. He is at high risk given his history of cancer and possible recurrent cancer in the lung. Will order CT PE study as well as CT neck soft tissue given his hoarseness. I've also ordered 1 L IV fluid, CBC, CMP, chest x-ray, lactic acid, troponin.   Carlos Levering, PA-C 03/10/18 1459    Jola Schmidt, MD 03/10/18 [phone]

## 2018-03-10 NOTE — ED Provider Notes (Signed)
[facility] EMERGENCY DEPARTMENT Provider Note   CSN: [id number] Arrival date & time: 03/10/18  1413     History   Chief Complaint Chief Complaint  Patient presents with  . URI    HPI Tony Berry is a 57 y.o. male.  The history is provided by the patient and medical records. No language interpreter was used.  URI   Associated symptoms include cough. Pertinent negatives include no chest pain.   Tony Berry is a 57 y.o. male  who presents to the Emergency Department complaining of persistent shortness of breath for a couple weeks. Per patient, he was seen at [facility] about a month ago where he was hospitalized for PNA. He was admitted and subsequently discharged on oral ABX (unsure of what medication) which he took until completion. He does not feel like he is getting any better. He is having shortness of breath despite using home inhaler. Per patient, he has hx of throat cancer s/p radiation treatment 6 months ago. He was told that "they got it all" after his last radiation treatment, however he had a ct scan of his chest about 4 months ago which showed "new spots" on his lungs. He is trying to get a biopsy of these lung nodules, however has not gotten this done due to insurance troubles and getting approval. He denies any chest pain. No fever/chills. He has been persistently hoarse and having some difficulty swallowing for about a month now as well.    Past Medical History:  Diagnosis Date  . Hypertension     There are no active problems to display for this patient.   Past Surgical History:  Procedure Laterality Date  . BACK SURGERY    . FOOT SURGERY          Home Medications    Prior to Admission medications   Medication Sig Start Date End Date Taking? Authorizing Provider  METHADONE HCL PO Take 115 mg by mouth once a week.   Yes [provider]  fluticasone (FLONASE) 50 MCG/ACT nasal spray Place 1 spray into both nostrils daily.     [provider]  Fluticasone-Umeclidin-Vilant (TRELEGY ELLIPTA) 100-62.5-25 MCG/INH AEPB Inhale 1 puff into the lungs daily.    [provider]    Family History No family history on file.  Social History Social History   Tobacco Use  . Smoking status: Current Every Day Smoker  Substance Use Topics  . Alcohol use: No  . Drug use: No     Allergies   Patient has no known allergies.   Review of Systems Review of Systems  HENT: Positive for trouble swallowing.        + hoarseness  Respiratory: Positive for cough and shortness of breath.   Cardiovascular: Negative for chest pain.  All other systems reviewed and are negative.    Physical Exam Updated Vital Signs BP 117/73   Pulse 72   Temp (!) 97.5 F (36.4 C) (Oral)   Resp 16   Ht 5\' 7"  (1.702 m)   Wt 49.9 kg   SpO2 100%   BMI 17.23 kg/m   Physical Exam Vitals signs and nursing note reviewed.  Constitutional:      General: He is not in acute distress.    Appearance: He is well-developed.     Comments: Cachectic male with hoarse voice.  HENT:     Head: Normocephalic and atraumatic.     Mouth/Throat:     Pharynx: Oropharynx is  clear.  Cardiovascular:     Rate and Rhythm: Normal rate and regular rhythm.     Heart sounds: Normal heart sounds. No murmur.  Pulmonary:     Effort: Pulmonary effort is normal. No respiratory distress.     Comments: Faint expiratory wheezing bilaterally. Abdominal:     General: There is no distension.     Palpations: Abdomen is soft.     Tenderness: There is no abdominal tenderness.  Skin:    General: Skin is warm and dry.  Neurological:     Mental Status: He is alert and oriented to person, place, and time.      ED Treatments / Results  Labs (all labs ordered are listed, but only abnormal results are displayed) Labs Reviewed  BASIC METABOLIC PANEL - Abnormal; Notable for the following components:      Result Value   Sodium 127 (*)    Potassium 5.5 (*)     Chloride 84 (*)    CO2 33 (*)    Glucose, Bld 123 (*)    BUN 37 (*)    Calcium 8.8 (*)    All other components within normal limits  CBC WITH DIFFERENTIAL/PLATELET - Abnormal; Notable for the following components:   WBC 23.7 (*)    RBC 3.97 (*)    Hemoglobin 11.4 (*)    HCT 37.1 (*)    Neutro Abs 22.8 (*)    All other components within normal limits  BRAIN NATRIURETIC PEPTIDE - Abnormal; Notable for the following components:   B Natriuretic Peptide 693.7 (*)    All other components within normal limits  URINALYSIS, ROUTINE W REFLEX MICROSCOPIC - Abnormal; Notable for the following components:   Color, Urine AMBER (*)    APPearance HAZY (*)    Hgb urine dipstick MODERATE (*)    Bacteria, UA RARE (*)    All other components within normal limits  I-STAT TROPONIN, ED - Abnormal; Notable for the following components:   Troponin i, poc 0.84 (*)    All other components within normal limits  CULTURE, BLOOD (ROUTINE X 2)  CULTURE, BLOOD (ROUTINE X 2)  HEPARIN LEVEL (UNFRACTIONATED)  HEPARIN LEVEL (UNFRACTIONATED)  CBC  I-STAT CG4 LACTIC ACID, ED    EKG EKG Interpretation  Date/Time:  Wednesday March 10 2018 17:06:54 EST Ventricular Rate:  83 PR Interval:    QRS Duration: 107 QT Interval:  400 QTC Calculation: 470 R Axis:   84 Text Interpretation:  Sinus rhythm Nonspecific T abnormalities, lateral leads Minimal ST elevation, inferior leads No significant change since last tracing Confirmed by Orlie Dakin [phone]) on 03/10/2018 5:15:54 PM   Radiology Dg Chest 2 View  Result Date: 03/10/2018 CLINICAL DATA:  Follow-up pneumonia. EXAM: CHEST - 2 VIEW COMPARISON:  02/03/2018 FINDINGS: Heart size is normal. Aortic atherosclerosis. Patchy bilateral lower lobe pneumonia, worsened when compared to the study of November. No effusion. Upper lobes remain clear. IMPRESSION: Worsening of bilateral lower lobe pneumonia compared to the study of 02/03/2018. Electronically Signed    By: Nelson Chimes M.D.   On: 03/10/2018 16:06   Ct Soft Tissue Neck W Contrast  Result Date: 03/10/2018 CLINICAL DATA:  57 y/o M; history of throat cancer presenting with hoarseness. Concern for recurrent tumor. EXAM: CT NECK WITH CONTRAST TECHNIQUE: Multidetector CT imaging of the neck was performed using the standard protocol following the bolus administration of intravenous contrast. CONTRAST:  173mL ISOVUE-370 IOPAMIDOL (ISOVUE-370) INJECTION 76% COMPARISON:  02/01/2018, 08/25/2017, 11/03/2016 CT neck. 01/06/2018 PET-CT. FINDINGS:  Pharynx and larynx: Low-attenuation thickening of supraglottic mucosa, aryepiglottic folds, epiglottis. Linear non masslike enhancement of superficial laryngeal mucosa. No discrete mass lesion identified. Patent airway. Salivary glands: No inflammation, mass, or stone. Thyroid: Normal. Lymph nodes: Stable right supraclavicular lymph node measuring 15 x 9 mm (series 2, image 79). Additional cervical lymph nodes are subcentimeter and stable. Vascular: Mixed plaque of the bilateral carotid bifurcations. Mild left proximal ICA stenosis. Limited intracranial: Negative. Visualized orbits: Negative. Mastoids and visualized paranasal sinuses: Clear. Skeleton: Advanced multilevel cervical spondylosis with moderate to severe spinal canal stenosis at the C5-6 level. Uncovertebral and facet hypertrophy encroaches on neural foramen throughout the cervical spine. Upper chest: Waxing and waning clustered ground-glass nodules in bronchovascular distribution in the right upper lobe likely related to bronchiolitis. Moderate emphysema. Other: None. IMPRESSION: 1. Linear non masslike enhancement of superficial supraglottic mucosa as well as low-attenuation mucosal thickening of supraglottic mucosa and epiglottis is stable from prior CT of the neck. Findings likely represent posttreatment changes. No discrete mass identified. Patent airway. 2. Stable cervical lymph nodes. 3. Stable advanced spondylosis  of the cervical spine with moderate to severe C5-6 spinal canal stenosis. 4. Waxing and waning ground-glass nodules in right upper lobe, likely minimal bronchiolitis. 5. Emphysema. Electronically Signed   By: Kristine Garbe M.D.   On: 03/10/2018 16:44   Ct Angio Chest Pe W And/or Wo Contrast  Result Date: 03/10/2018 CLINICAL DATA:  [place]. Recent diagnosis of pneumonia. History of head and neck cancer. EXAM: CT ANGIOGRAPHY CHEST WITH CONTRAST TECHNIQUE: Multidetector CT imaging of the chest was performed using the standard protocol during bolus administration of intravenous contrast. Multiplanar CT image reconstructions and MIPs were obtained to evaluate the vascular anatomy. CONTRAST:  156mL ISOVUE-370 IOPAMIDOL (ISOVUE-370) INJECTION 76% COMPARISON:  Chest radiograph March 10, 2018 and CT chest January 31, 2018 FINDINGS: CARDIOVASCULAR: Adequate contrast opacification of the pulmonary artery's. Main pulmonary artery is not enlarged. No pulmonary arterial filling defects to the level of the subsegmental branches. Heart size is normal, no right heart strain. No pericardial effusion. Thoracic aorta is normal course and caliber, mild calcific atherosclerosis. MEDIASTINUM/NODES: No lymphadenopathy by CT size criteria. LUNGS/PLEURA: Tracheobronchial tree is patent, no pneumothorax. Worsening tree-in-bud infiltrates and multifocal consolidation with a basilar predominance. No discrete pulmonary nodule in LEFT lower lobe. No pleural effusion. Moderate centrilobular emphysema with increased lung volumes, flattened hemidiaphragms. UPPER ABDOMEN: Non-acute. MUSCULOSKELETAL: Non-acute. Review of the MIP images confirms the above findings. IMPRESSION: 1. Worsening tree-in-bud infiltrates and focal consolidation consistent with multifocal pneumonia, given basilar predominance, aspiration is possible. Aortic Atherosclerosis (ICD10-I70.0) and Emphysema (ICD10-J43.9). Electronically Signed   By: Elon Alas M.D.   On: 03/10/2018 16:43    Procedures Procedures (including critical care time)  CRITICAL CARE Performed by: Ozella Almond Talal Fritchman  Total critical care time: 35 minutes  Critical care time was exclusive of separately billable procedures and treating other patients.  Critical care was necessary to treat or prevent imminent or life-threatening deterioration.  Critical care was time spent personally by me on the following activities: development of treatment plan with patient and/or surrogate as well as nursing, discussions with consultants, evaluation of patient's response to treatment, examination of patient, obtaining history from patient or surrogate, ordering and performing treatments and interventions, ordering and review of laboratory studies, ordering and review of radiographic studies, pulse oximetry and re-evaluation of patient's condition.   Medications Ordered in ED Medications  heparin ADULT infusion 100 units/mL (25000 units/285mL sodium chloride 0.45%) (800 Units/hr Intravenous New  Bag/Given 03/10/18 1531)  ceFEPIme (MAXIPIME) 1 g in sodium chloride 0.9 % 100 mL IVPB (has no administration in time range)  vancomycin (VANCOCIN) IVPB 1000 mg/200 mL premix (1,000 mg Intravenous New Bag/Given 03/10/18 1716)  vancomycin (VANCOCIN) IVPB 750 mg/150 ml premix (has no administration in time range)  metroNIDAZOLE (FLAGYL) IVPB 500 mg (has no administration in time range)  sodium chloride 0.9 % bolus 500 mL (500 mLs Intravenous New Bag/Given 03/10/18 1719)  sodium chloride 0.9 % bolus 1,000 mL (0 mLs Intravenous Stopped 03/10/18 1700)  heparin bolus via infusion 3,000 Units (3,000 Units Intravenous Bolus from Bag 03/10/18 1532)  iopamidol (ISOVUE-370) 76 % injection (100 mLs  Contrast Given 03/10/18 1600)  albuterol (PROVENTIL) (2.5 MG/3ML) 0.083% nebulizer solution 10 mg (10 mg Nebulization Given 03/10/18 1635)     Initial Impression / Assessment and Plan / ED Course  I  have reviewed the triage vital signs and the nursing notes.  Pertinent labs & imaging results that were available during my care of the patient were reviewed by me and considered in my medical decision making (see chart for details).    Tony Berry is a 57 y.o. male who presents to ED for shortness of breath.  Per patient, he was admitted at [facility] about 3 to 4 weeks ago for pneumonia.  He was discharged home on oral antibiotics which he took until completed.  On initial examination, patient was afebrile, however tachypneic and hypotensive.  He is not typically on home oxygen and was on 10 L nonrebreather on initial assessment.  Started on fluid bolus.  Lactic WDL.  White count elevated at 23.7. Blood cx's obtained. Troponin elevated at 0.84. He is not experiencing any chest pain. EKG non-ischemic. Given recent hospitalization / immobilization and ? Cancer dx, PE considered therefore CT angio obtained. In the interim, heparin bolus given for ACS vs. PE while awaiting diagnostic imaging. CT shows worsening infiltrates and focal consolidation consistent with multifocal pneumonia.  Per radiology, aspiration is possible.  Considering his difficulty swallowing and hoarseness, clinically aspiration seems likely as well, therefore will cover for this.  Started on Vanco, cefepime and Flagyl at pharmacist recommendations. Repeat EKG still non-ischemic. Given overall findings today, troponin likely 2/2 demand ischemia due to SIRS / infection. Discussed with internal medicine teaching service who will admit.  Patient discussed with Dr. Venora Maples and Dr. Winfred Leeds who agreed with treatment plan.    Final Clinical Impressions(s) / ED Diagnoses   Final diagnoses:  Multifocal pneumonia    ED Discharge Orders    None       Ramondo Dietze, Ozella Almond, PA-C 03/10/18 1740    Orlie Dakin, MD 03/11/18 0028

## 2018-03-11 ENCOUNTER — Inpatient Hospital Stay (HOSPITAL_COMMUNITY): Payer: Medicaid Other

## 2018-03-11 ENCOUNTER — Encounter (HOSPITAL_COMMUNITY): Payer: Self-pay

## 2018-03-11 DIAGNOSIS — J439 Emphysema, unspecified: Secondary | ICD-10-CM

## 2018-03-11 DIAGNOSIS — I214 Non-ST elevation (NSTEMI) myocardial infarction: Secondary | ICD-10-CM

## 2018-03-11 DIAGNOSIS — R740 Nonspecific elevation of levels of transaminase and lactic acid dehydrogenase [LDH]: Secondary | ICD-10-CM

## 2018-03-11 DIAGNOSIS — F17201 Nicotine dependence, unspecified, in remission: Secondary | ICD-10-CM

## 2018-03-11 DIAGNOSIS — E871 Hypo-osmolality and hyponatremia: Secondary | ICD-10-CM | POA: Diagnosis present

## 2018-03-11 DIAGNOSIS — R7989 Other specified abnormal findings of blood chemistry: Secondary | ICD-10-CM | POA: Diagnosis present

## 2018-03-11 DIAGNOSIS — Z79899 Other long term (current) drug therapy: Secondary | ICD-10-CM

## 2018-03-11 DIAGNOSIS — E43 Unspecified severe protein-calorie malnutrition: Secondary | ICD-10-CM | POA: Diagnosis present

## 2018-03-11 DIAGNOSIS — R778 Other specified abnormalities of plasma proteins: Secondary | ICD-10-CM | POA: Diagnosis present

## 2018-03-11 DIAGNOSIS — R7401 Elevation of levels of liver transaminase levels: Secondary | ICD-10-CM | POA: Diagnosis present

## 2018-03-11 DIAGNOSIS — R300 Dysuria: Secondary | ICD-10-CM

## 2018-03-11 DIAGNOSIS — J9621 Acute and chronic respiratory failure with hypoxia: Secondary | ICD-10-CM

## 2018-03-11 DIAGNOSIS — Z8701 Personal history of pneumonia (recurrent): Secondary | ICD-10-CM

## 2018-03-11 DIAGNOSIS — Z960 Presence of urogenital implants: Secondary | ICD-10-CM

## 2018-03-11 DIAGNOSIS — J9622 Acute and chronic respiratory failure with hypercapnia: Secondary | ICD-10-CM

## 2018-03-11 DIAGNOSIS — E875 Hyperkalemia: Secondary | ICD-10-CM

## 2018-03-11 DIAGNOSIS — A419 Sepsis, unspecified organism: Principal | ICD-10-CM

## 2018-03-11 DIAGNOSIS — R0602 Shortness of breath: Secondary | ICD-10-CM

## 2018-03-11 DIAGNOSIS — J690 Pneumonitis due to inhalation of food and vomit: Secondary | ICD-10-CM

## 2018-03-11 DIAGNOSIS — R6520 Severe sepsis without septic shock: Secondary | ICD-10-CM

## 2018-03-11 LAB — COMPREHENSIVE METABOLIC PANEL
ALT: 360 U/L — AB (ref 0–44)
AST: 590 U/L — ABNORMAL HIGH (ref 15–41)
Albumin: 2 g/dL — ABNORMAL LOW (ref 3.5–5.0)
Alkaline Phosphatase: 42 U/L (ref 38–126)
Anion gap: 9 (ref 5–15)
BUN: 24 mg/dL — ABNORMAL HIGH (ref 6–20)
CALCIUM: 8 mg/dL — AB (ref 8.9–10.3)
CO2: 30 mmol/L (ref 22–32)
CREATININE: 0.51 mg/dL — AB (ref 0.61–1.24)
Chloride: 92 mmol/L — ABNORMAL LOW (ref 98–111)
GFR calc Af Amer: >60 mL/min (ref >60)
GFR calc non Af Amer: >60 mL/min (ref >60)
Glucose, Bld: 94 mg/dL (ref 70–99)
Potassium: 4.4 mmol/L (ref 3.5–5.1)
Sodium: 131 mmol/L — ABNORMAL LOW (ref 135–145)
Total Bilirubin: 0.6 mg/dL (ref 0.3–1.2)
Total Protein: 5.9 g/dL — ABNORMAL LOW (ref 6.5–8.1)

## 2018-03-11 LAB — CBC
HCT: 31.9 % — ABNORMAL LOW (ref 39.0–52.0)
Hemoglobin: 10.1 g/dL — ABNORMAL LOW (ref 13.0–17.0)
MCH: 29.4 pg (ref 26.0–34.0)
MCHC: 31.7 g/dL (ref 30.0–36.0)
MCV: 93 fL (ref 80.0–100.0)
Platelets: 180 10*3/uL (ref 150–400)
RBC: 3.43 MIL/uL — ABNORMAL LOW (ref 4.22–5.81)
RDW: 12.4 % (ref 11.5–15.5)
WBC: 13.8 10*3/uL — ABNORMAL HIGH (ref 4.0–10.5)
nRBC: 0 % (ref 0.0–0.2)

## 2018-03-11 LAB — BLOOD GAS, VENOUS
Acid-Base Excess: 6.4 mmol/L — ABNORMAL HIGH (ref 0.0–2.0)
Bicarbonate: 31.8 mmol/L — ABNORMAL HIGH (ref 20.0–28.0)
O2 Saturation: 90.5 %
Patient temperature: 98.6
pCO2, Ven: 59.2 mmHg (ref 44.0–60.0)
pH, Ven: 7.35 (ref 7.250–7.430)
pO2, Ven: 62.8 mmHg — ABNORMAL HIGH (ref 32.0–45.0)

## 2018-03-11 LAB — LACTIC ACID, PLASMA
Lactic Acid, Venous: 0.9 mmol/L (ref 0.5–1.9)
Lactic Acid, Venous: 0.8 mmol/L (ref 0.5–1.9)

## 2018-03-11 LAB — MAGNESIUM: Magnesium: 1.9 mg/dL (ref 1.7–2.4)

## 2018-03-11 LAB — TROPONIN I: Troponin I: 0.63 ng/mL (ref <0.03)

## 2018-03-11 LAB — CORTISOL-AM, BLOOD: Cortisol - AM: 20.8 ug/dL (ref 6.7–22.6)

## 2018-03-11 LAB — PROTIME-INR
INR: 1.41
Prothrombin Time: 17.1 seconds — ABNORMAL HIGH (ref 11.4–15.2)

## 2018-03-11 LAB — PHOSPHORUS: Phosphorus: 1.6 mg/dL — ABNORMAL LOW (ref 2.5–4.6)

## 2018-03-11 LAB — TSH: TSH: 1.946 u[IU]/mL (ref 0.350–4.500)

## 2018-03-11 LAB — ECHOCARDIOGRAM COMPLETE
Height: 66 in
Weight: 1837.75 oz

## 2018-03-11 LAB — MRSA PCR SCREENING: MRSA by PCR: NEGATIVE

## 2018-03-11 LAB — HIV ANTIBODY (ROUTINE TESTING W REFLEX): HIV Screen 4th Generation wRfx: NONREACTIVE

## 2018-03-11 MED ORDER — IPRATROPIUM-ALBUTEROL 0.5-2.5 (3) MG/3ML IN SOLN
3.0000 mL | Freq: Two times a day (BID) | RESPIRATORY_TRACT | Status: DC
  Administered 2018-03-11: 3 mL via RESPIRATORY_TRACT
  Filled 2018-03-11: qty 3

## 2018-03-11 MED ORDER — SODIUM CHLORIDE 0.9 % IV BOLUS
1000.0000 mL | Freq: Once | INTRAVENOUS | Status: AC
  Administered 2018-03-11: 1000 mL via INTRAVENOUS

## 2018-03-11 MED ORDER — METHADONE HCL 10 MG PO TABS
115.0000 mg | ORAL_TABLET | Freq: Every day | ORAL | Status: DC
  Administered 2018-03-11 – 2018-03-15 (×5): 115 mg via ORAL
  Filled 2018-03-11 (×5): qty 12

## 2018-03-11 MED ORDER — SODIUM CHLORIDE 0.9 % IV BOLUS: 1000.0000 mL | Freq: Once | INTRAVENOUS | Status: DC

## 2018-03-11 MED ORDER — ENSURE ENLIVE PO LIQD
237.0000 mL | Freq: Three times a day (TID) | ORAL | Status: DC
  Administered 2018-03-11 – 2018-03-15 (×8): 237 mL via ORAL

## 2018-03-11 MED ORDER — RESOURCE THICKENUP CLEAR PO POWD
ORAL | Status: DC | PRN
  Filled 2018-03-11: qty 125

## 2018-03-11 MED ORDER — NICOTINE 21 MG/24HR TD PT24
21.0000 mg | MEDICATED_PATCH | Freq: Every day | TRANSDERMAL | Status: DC
  Administered 2018-03-11 – 2018-03-15 (×5): 21 mg via TRANSDERMAL
  Filled 2018-03-11 (×5): qty 1

## 2018-03-11 MED ORDER — SODIUM CHLORIDE 0.9 % IV SOLN
INTRAVENOUS | Status: AC
  Administered 2018-03-11: 14:00:00 via INTRAVENOUS

## 2018-03-11 MED ORDER — IPRATROPIUM-ALBUTEROL 0.5-2.5 (3) MG/3ML IN SOLN: 3.0000 mL | Freq: Four times a day (QID) | RESPIRATORY_TRACT | Status: DC | PRN

## 2018-03-11 MED ORDER — SENNOSIDES-DOCUSATE SODIUM 8.6-50 MG PO TABS: 1.0000 | ORAL_TABLET | Freq: Every day | ORAL | Status: DC

## 2018-03-11 NOTE — Progress Notes (Signed)
1000 mL normal saline bolus complete. Bolus run to gravity due to ordered rate at 1935.5 mL/hr and pump capability of 999 mL/hr. Total run time for fluid was 31 minutes.

## 2018-03-11 NOTE — Evaluation (Signed)
Clinical/Bedside Swallow Evaluation Patient Details  Name: Tony Berry MRN: [id number] Date of Birth: 1960-08-10  Today's Date: 03/11/2018 Time: SLP Start Time (ACUTE ONLY): 1140 SLP Stop Time (ACUTE ONLY): 1154 SLP Time Calculation (min) (ACUTE ONLY): 14 min  Past Medical History:  Past Medical History:  Diagnosis Date  . Hypertension    Past Surgical History:  Past Surgical History:  Procedure Laterality Date  . BACK SURGERY    . FOOT SURGERY     HPI:  Tony Berry is a 57 yo male with PMH esophageal cancer s/p radiation ending 7 months ago, malnutrition, chronic back pain on methadone with recent admission to [place] [facility] one month ago for pneumonia presenting with SOB and weakness that has been worsening since previous discharge. Chest CT 12/18: "Worsening tree-in-bud infiltrates and focal consolidation consistent with multifocal pneumonia, given basilar predominance, aspiration is possible."  Pt reports being placed on thickened liquid during admission to [place], but could not recall consistency.  Pt reports most recent swallow assessment by FEES 3-4 months previously with no abnormal findings.   Assessment / Plan / Recommendation Clinical Impression  Pt presents with clinical indicators of pharyngeal dysphagia.  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  Pt states he has been scoped in the past and VF are mobile.  Reports most recent swallow test 3-4 months ago via FEES with no abnormal findings.  Pt did rarely acheive some phonation with very harsh vocal quality.  Pt has hx H&N CA s/p rad tx 6 or 8 months ago.  Pt reports after treatment he worked with SLP and received HEP, but admits he did not keep up with swallowing exercises.  Pt reports he was on thickened liquids during admission to [facility] but could not recall consistency.  Pt reports eating unrestricted diet at home.  RN notes that pt has been PEG reliant in the past.  With thin liquid  there was immediate coughing.  Cough was very weak and wet in quality.  There was mild throat clearing with nectar thick liquid intermittently.  Pt endorsed subjective improvement in swallow with nectar thick liquid. Pt tolerated puree and regular solid.  Suspect significant pharyngeal residue, as pt was noted to bring up masticated solid when expectorating phlegm.  Cough and expectoration of phlegm was volitional and unrelated to PO intake.  Discussed diet preference with pt who requested softer foods.    Recommend ground diet with nectar thick liquid.  MBSS has been scheduled for next date to further assess pharyngeal swallow function.  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 Chest CT 12/18: "Worsening tree-in-bud infiltrates and focal consolidation consistent with multifocal pneumonia, given basilar predominance, aspiration is possible."  Pt reports being placed on thickened liquid during admission to [place], but could not recall consistency.  Pt reports most recent swallow assessment by FEES 3-4 months previously with no abnormal findings. Type of Study: Bedside Swallow Evaluation Previous Swallow Assessment: Pt reports previous swallow assessment 3-4 months ago by FEES.  Pt also reports  receiving HEP from SLP after completing radiation, but admits he has not kept up with swallow exercises. Diet Prior to this Study: NPO Temperature Spikes Noted: No History of Recent Intubation: No Behavior/Cognition: Alert;Cooperative;Pleasant mood Oral Cavity Assessment: Dry Oral Cavity - Dentition: Edentulous(Pt has dentures but states he does not wear them for oral intake.) Self-Feeding Abilities: Able to feed self Patient Positioning: Upright in bed Baseline Vocal Quality: Aphonic(very infrequent phonation with harsh vocal quality) Volitional Cough: Weak Volitional Swallow: Unable to elicit(2/2 xerostomia)    Oral/Motor/Sensory Function Overall Oral Motor/Sensory Function: Within functional limits   Ice Chips     Thin Liquid Thin Liquid: Impaired Presentation: Spoon;Straw;Cup Pharyngeal  Phase Impairments: Cough - Immediate;Decreased hyoid-laryngeal movement;Multiple swallows;Suspected delayed Swallow    Nectar Thick Nectar Thick Liquid: Impaired Presentation: Cup;Spoon Pharyngeal Phase Impairments: Multiple swallows;Decreased hyoid-laryngeal movement   Honey Thick     Puree Presentation: Spoon Pharyngeal Phase Impairments: Multiple swallows   Solid     Presentation: Self Fed Pharyngeal Phase Impairments: Multiple swallows;Throat Clearing - Delayed      Tony Berry 03/11/2018,1:06 PM

## 2018-03-11 NOTE — Progress Notes (Signed)
  Echocardiogram 2D Echocardiogram has been performed.  Tony Berry 03/11/2018, 9:02 AM

## 2018-03-11 NOTE — Progress Notes (Addendum)
Notified Internal medicine of patient's BP 95/60. Continuous IV fluids NS at 75 ml/hr or expired. Awaiting orders as needed.

## 2018-03-11 NOTE — Progress Notes (Signed)
   Subjective:   Mr. Chirico reports of dysuria this am that is constant and believes it is due to condom cath. No specific pain with urination or difficulty urinating. Still endorses hoarseness but does reports improvement with shortness of breath. Some pain in his throat but no pain in his mouth.   Objective:  Vital signs in last 24 hours: Vitals:   03/11/18 1030 03/11/18 1100 03/11/18 1130 03/11/18 1200  BP: 102/63 102/66 95/81 (!) 104/59  Pulse: 73 76 71 75  Resp: 12 10 11 13   Temp:   97.9 F (36.6 C) 98 F (36.7 C)  TempSrc:   Oral Oral  SpO2: 90% 91% 92% 92%  Weight:      Height:       Physical Exam: Constitution: supine in bed, cachectic HENT: mouth lesions decreased from previous  Eyes: eom intact, no scleral icterus Cardio: RRR, no m/r/g, distant heart sounds Respiratory: +wheezing, crackles bilaterally, decreased breath sounds RLL Abdominal: NTTP, soft, non-distended MSK: no edema Neuro: a&o, cooperative  ECHO Impressions:  - Mild respiratory related leftward septal shift with prominence of   the pericardium posteriorly, with dilated IVC. On review of   clinical history, cannot exclude pericardial constriction.  Recommendations:  Consider cardiac MRI with free breathing sequences and gadolinium contrast to assess for pericardial constriction if clinically indicated.  Assessment/Plan:  Active Problems:   Multifocal pneumonia   Transaminitis   Troponin level elevated  Multifocal Aspiration Pneumonia COPD MRSA negative, will hold Vanc. SOB is improving. Repeat LA negative and VBG shows chronic compensated hypercapnea. Satting well on 4L [place]. Did have an episode of hypotension over night and was given 1L bolus NS with improvement in blood pressure. He has been stable today. Seen by SLP as well and patient was previously on thickened liquid diet at [facility], found to have immediate coughing with thin liquids which he was drinking at home.   - Wean O2 for sats  >90% - d/c vanc - cont. Cefepime and metronidazole 7 days total - cont fluids 75cc/hr LR - duo-nebs q12h - switch to prn tomorrow - am CBC  Elevated Troponin Trending down .9 >> .63. ECHO done and shows dilated IVC with concern for cardiac constriction. He has a history of COPD and elevated troponin could be secondary to pulmonary hypertension. With his history of radiation cardiotoxicity. Patient states his radiation was closer to the proximal 1/3 of the esophagus but charts are unavailable.   - cardiology consulted, appreciate recommendations  - cont. Asa 81  Transaminitis CMP showed AST and ALT elevation to 590 and 360 respectively, found incidentally but necessitates further workup. With previous mouth sores will order HSV pcr, and hepatitis labs.   - HCV, HBV, HSV pcr - am CMP  Hyponatremia Hypokalemia Resolved.   History of Esophageal Cancer/Mouth Sores Malnutrition Continuing to have difficulty swallowing, throat pain, but no mouth pain. Sores in mouth appear to be resolving. Seen by SLP and given dysphagia 2 diet with nectar thick liquids.   - HSV pcr - dysphagia 2 diet  OUD  - cont. 115 mg methadone liquid - senokot scheduled   Diet: regular VTE: lovenox IVF: 75cc hr NS Code: DNR/DNI   Dispo: Anticipated discharge in approximately 3-4 days.   Molli Hazard A, DO 03/11/2018, 1:48 PM Pager: [phone] IMTS PGY-1

## 2018-03-11 NOTE — Consult Note (Signed)
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 male.  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 Patient has since stabilized and was comfortable without any breathing difficulty, on my interview. He reports significant difficulty swallowing, nausea, and generalized weakness I do not have any information available regarding his cancer therapy, but according the patient, "cancer in my throat is now gone, but they are worried about spots on the lungs. Radiation is still ongoing".   During the hospitalization, he was treated for possible pneumonia with improvement in his leukocytosis.    Past Medical History:  Diagnosis Date  . Hypertension     Past Surgical History:  Procedure Laterality Date  . BACK SURGERY    . FOOT SURGERY      No family history on file.  Social History:  reports that he quit smoking about 2 weeks ago. His smoking use included cigarettes. He started smoking about 30 years ago. He has a 30.00 pack-year smoking history. He has never used smokeless tobacco. He reports that he does not drink alcohol or use drugs.  Allergies: No Known Allergies  Medications: I have reviewed the patient's current medications.  Results for orders placed or performed during the hospital encounter of 03/10/18 (from the past 48 hour(s))  Urinalysis, Routine w reflex microscopic     Status: Abnormal   Collection Time: 03/10/18  2:41 PM  Result Value Ref Range   Color, Urine AMBER (A) YELLOW    Comment: BIOCHEMICALS MAY BE AFFECTED BY COLOR   APPearance HAZY (A) CLEAR   Specific Gravity, Urine 1.019 1.005 - 1.030   pH 5.0 5.0 - 8.0   Glucose, UA NEGATIVE NEGATIVE mg/dL   Hgb urine dipstick MODERATE (A) NEGATIVE   Bilirubin  Urine NEGATIVE NEGATIVE   Ketones, ur NEGATIVE NEGATIVE mg/dL   Protein, ur NEGATIVE NEGATIVE mg/dL   Nitrite NEGATIVE NEGATIVE   Leukocytes, UA NEGATIVE NEGATIVE   RBC / HPF 0-5 0 - 5 RBC/hpf   WBC, UA 0-5 0 - 5 WBC/hpf   Bacteria, UA RARE (A) NONE SEEN   Squamous Epithelial / LPF 0-5 0 - 5   Mucus PRESENT    Hyaline Casts, UA PRESENT     Comment: Performed at [facility], [address], [place], [place] [address]  Basic metabolic panel     Status: Abnormal   Collection Time: 03/10/18  2:42 PM  Result Value Ref Range   Sodium 127 (L) 135 - 145 mmol/L   Potassium 5.5 (H) 3.5 - 5.1 mmol/L   Chloride 84 (L) 98 - 111 mmol/L   CO2 33 (H) 22 - 32 mmol/L   Glucose, Bld 123 (H) 70 - 99 mg/dL   BUN 37 (H) 6 - 20 mg/dL   Creatinine, Ser 0.98 0.61 - 1.24 mg/dL   Calcium 8.8 (L) 8.9 - 10.3 mg/dL   GFR calc non Af Amer >60 >60 mL/min   GFR calc Af Amer >60 >60 mL/min   Anion gap 10 5 - 15    Comment: Performed at [facility] [address] [place], [place] [address]  CBC with Differential     Status: Abnormal   Collection Time: 03/10/18  2:42 PM  Result Value Ref Range   WBC 23.7 (H) 4.0 -  10.5 K/uL   RBC 3.97 (L) 4.22 - 5.81 MIL/uL   Hemoglobin 11.4 (L) 13.0 - 17.0 g/dL   HCT 37.1 (L) 39.0 - 52.0 %   MCV 93.5 80.0 - 100.0 fL   MCH 28.7 26.0 - 34.0 pg   MCHC 30.7 30.0 - 36.0 g/dL   RDW 12.1 11.5 - 15.5 %   Platelets 269 150 - 400 K/uL   nRBC 0.0 0.0 - 0.2 %   Neutrophils Relative % 95 %   Neutro Abs 22.8 (H) 1.7 - 7.7 K/uL   Band Neutrophils 1 %   Lymphocytes Relative 3 %   Lymphs Abs 0.7 0.7 - 4.0 K/uL   Monocytes Relative 1 %   Monocytes Absolute 0.2 0.1 - 1.0 K/uL   Eosinophils Relative 0 %   Eosinophils Absolute 0.0 0.0 - 0.5 K/uL   Basophils Relative 0 %   Basophils Absolute 0.0 0.0 - 0.1 K/uL   nRBC 0 0 /100 WBC   Abs Immature Granulocytes 0.00 0.00 - 0.07 K/uL    Comment: Performed at [facility], [place] [address], [place], [place] [address]   Brain natriuretic peptide     Status: Abnormal   Collection Time: 03/10/18  2:42 PM  Result Value Ref Range   B Natriuretic Peptide 693.7 (H) 0.0 - 100.0 pg/mL    Comment: Performed at [facility] [address], [place], [place] [address]  I-stat troponin, ED     Status: Abnormal   Collection Time: 03/10/18  2:51 PM  Result Value Ref Range   Troponin i, poc 0.84 (HH) 0.00 - 0.08 ng/mL   Comment NOTIFIED PHYSICIAN    Comment 3            Comment: Due to the release kinetics of cTnI, a negative result within the first hours of the onset of symptoms does not rule out myocardial infarction with certainty. If myocardial infarction is still suspected, repeat the test at appropriate intervals.   I-Stat CG4 Lactic Acid, ED     Status: None   Collection Time: 03/10/18  2:57 PM  Result Value Ref Range   Lactic Acid, Venous 1.71 0.5 - 1.9 mmol/L  Culture, blood (routine x 2)     Status: None (Preliminary result)   Collection Time: 03/10/18  4:59 PM  Result Value Ref Range   Specimen Description BLOOD RIGHT ANTECUBITAL    Special Requests      BOTTLES DRAWN AEROBIC AND ANAEROBIC Blood Culture adequate volume   Culture      NO GROWTH < 24 HOURS Performed at [facility], [place] [address], [place], [place] [address]    Report Status PENDING   Culture, blood (routine x 2)     Status: None (Preliminary result)   Collection Time: 03/10/18  5:01 PM  Result Value Ref Range   Specimen Description BLOOD LEFT ANTECUBITAL    Special Requests      BOTTLES DRAWN AEROBIC AND ANAEROBIC Blood Culture adequate volume   Culture      NO GROWTH < 24 HOURS Performed at [facility], [place] [address] [place], [place] [address]    Report Status PENDING   Basic metabolic panel     Status: Abnormal   Collection Time: 03/10/18  7:36 PM  Result Value Ref Range   Sodium 128 (L) 135 - 145 mmol/L   Potassium 4.4 3.5 - 5.1 mmol/L    Comment: DELTA CHECK NOTED   Chloride 88 (  L) 98 - 111  mmol/L   CO2 31 22 - 32 mmol/L   Glucose, Bld 122 (H) 70 - 99 mg/dL   BUN 32 (H) 6 - 20 mg/dL   Creatinine, Ser 0.82 0.61 - 1.24 mg/dL   Calcium 8.0 (L) 8.9 - 10.3 mg/dL   GFR calc non Af Amer >60 >60 mL/min   GFR calc Af Amer >60 >60 mL/min   Anion gap 9 5 - 15    Comment: Performed at [facility] [address] [place], [place] [address]  Troponin I - Once     Status: Abnormal   Collection Time: 03/10/18  7:36 PM  Result Value Ref Range   Troponin I 0.90 (HH) <0.03 ng/mL    Comment: CRITICAL RESULT CALLED TO, READ BACK BY AND VERIFIED WITH: B.SMITH,RN 2053 03/10/18 M.CAMPBELL Performed at [facility], [place] [address], [place], [place] [address]   TSH     Status: None   Collection Time: 03/11/18  5:18 AM  Result Value Ref Range   TSH 1.946 0.350 - 4.500 uIU/mL    Comment: Performed by a 3rd Generation assay with a functional sensitivity of <=0.01 uIU/mL. Performed at [facility], [place] [address], [place], [place] [address]   Comprehensive metabolic panel     Status: Abnormal   Collection Time: 03/11/18  5:18 AM  Result Value Ref Range   Sodium 131 (L) 135 - 145 mmol/L   Potassium 4.4 3.5 - 5.1 mmol/L   Chloride 92 (L) 98 - 111 mmol/L   CO2 30 22 - 32 mmol/L   Glucose, Bld 94 70 - 99 mg/dL   BUN 24 (H) 6 - 20 mg/dL   Creatinine, Ser 0.51 (L) 0.61 - 1.24 mg/dL   Calcium 8.0 (L) 8.9 - 10.3 mg/dL   Total Protein 5.9 (L) 6.5 - 8.1 g/dL   Albumin 2.0 (L) 3.5 - 5.0 g/dL   AST 590 (H) 15 - 41 U/L   ALT 360 (H) 0 - 44 U/L   Alkaline Phosphatase 42 38 - 126 U/L   Total Bilirubin 0.6 0.3 - 1.2 mg/dL   GFR calc non Af Amer >60 >60 mL/min   GFR calc Af Amer >60 >60 mL/min   Anion gap 9 5 - 15    Comment: Performed at [facility], [place] [address], [place], [place] [address]  HIV antibody (Routine Testing)     Status: None   Collection Time: 03/11/18  5:18 AM  Result Value Ref Range   HIV Screen 4th Generation wRfx Non Reactive Non Reactive    Comment:  (NOTE) Performed At: [facility] [place], [place] [id number] Rush Farmer MD [id number]   Cortisol-am, blood     Status: None   Collection Time: 03/11/18  5:18 AM  Result Value Ref Range   Cortisol - AM 20.8 6.7 - 22.6 ug/dL    Comment: Performed at [facility], [place] [address] [place], [place] [address]  Troponin I - Tomorrow AM 0500     Status: Abnormal   Collection Time: 03/11/18  5:18 AM  Result Value Ref Range   Troponin I 0.63 (HH) <0.03 ng/mL    Comment: CRITICAL VALUE NOTED.  VALUE IS CONSISTENT WITH PREVIOUSLY REPORTED AND CALLED VALUE. Performed at [facility], [place] [address] [place], [place] [address]   Lactic acid, plasma     Status: None   Collection Time: 03/11/18  7:45  AM  Result Value Ref Range   Lactic Acid, Venous 0.9 0.5 - 1.9 mmol/L    Comment: Performed at [facility] [address] [place], [place] [address]  MRSA PCR Screening     Status: None   Collection Time: 03/11/18  9:32 AM  Result Value Ref Range   MRSA by PCR NEGATIVE NEGATIVE    Comment:        The GeneXpert MRSA Assay (FDA approved for NASAL specimens only), is one component of a comprehensive MRSA colonization surveillance program. It is not intended to diagnose MRSA infection nor to guide or monitor treatment for MRSA infections. Performed at [facility], [place] [address], [place], [place] [address]   Lactic acid, plasma     Status: None   Collection Time: 03/11/18 10:19 AM  Result Value Ref Range   Lactic Acid, Venous 0.8 0.5 - 1.9 mmol/L    Comment: Performed at [facility] [address] [place], [place] [address]  Blood gas, venous     Status: Abnormal   Collection Time: 03/11/18 10:19 AM  Result Value Ref Range   pH, Ven 7.350 7.250 - 7.430   pCO2, Ven 59.2 44.0 - 60.0 mmHg   pO2, Ven 62.8 (H) 32.0 - 45.0 mmHg   Bicarbonate 31.8 (H) 20.0 - 28.0 mmol/L   Acid-Base Excess 6.4 (H) 0.0 - 2.0 mmol/L   O2 Saturation  90.5 %   Patient temperature 98.6    Collection site VEIN    Drawn by DRAWN BY RN    Sample type VENOUS   CBC     Status: Abnormal   Collection Time: 03/11/18  2:35 PM  Result Value Ref Range   WBC 13.8 (H) 4.0 - 10.5 K/uL   RBC 3.43 (L) 4.22 - 5.81 MIL/uL   Hemoglobin 10.1 (L) 13.0 - 17.0 g/dL   HCT 31.9 (L) 39.0 - 52.0 %   MCV 93.0 80.0 - 100.0 fL   MCH 29.4 26.0 - 34.0 pg   MCHC 31.7 30.0 - 36.0 g/dL   RDW 12.4 11.5 - 15.5 %   Platelets 180 150 - 400 K/uL   nRBC 0.0 0.0 - 0.2 %    Comment: Performed at [facility], [place] [address], [place], [place] [address]  Magnesium     Status: None   Collection Time: 03/11/18  2:35 PM  Result Value Ref Range   Magnesium 1.9 1.7 - 2.4 mg/dL    Comment: Performed at [facility], [place] [address] [place], [place] [address]  Phosphorus     Status: Abnormal   Collection Time: 03/11/18  2:35 PM  Result Value Ref Range   Phosphorus 1.6 (L) 2.5 - 4.6 mg/dL    Comment: Performed at [facility] [address] [place], [place] [address]  Protime-INR     Status: Abnormal   Collection Time: 03/11/18  2:35 PM  Result Value Ref Range   Prothrombin Time 17.1 (H) 11.4 - 15.2 seconds   INR 1.41     Comment: Performed at [facility] [address] [place], [place] [address]    Dg Chest 2 View  Result Date: 03/10/2018 CLINICAL DATA:  Follow-up pneumonia. EXAM: CHEST - 2 VIEW COMPARISON:  02/03/2018 FINDINGS: Heart size is normal. Aortic atherosclerosis. Patchy bilateral lower lobe pneumonia, worsened when compared to the study of November. No effusion. Upper lobes remain clear. IMPRESSION: Worsening of bilateral lower lobe pneumonia  compared to the study of 02/03/2018. Electronically Signed   By: Nelson Chimes M.D.   On: 03/10/2018 16:06   Ct Soft Tissue Neck W Contrast  Result Date: 03/10/2018 CLINICAL DATA:  58 y/o M; history of throat cancer presenting with hoarseness. Concern for recurrent tumor. EXAM: CT NECK WITH  CONTRAST TECHNIQUE: Multidetector CT imaging of the neck was performed using the standard protocol following the bolus administration of intravenous contrast. CONTRAST:  183mL ISOVUE-370 IOPAMIDOL (ISOVUE-370) INJECTION 76% COMPARISON:  02/01/2018, 08/25/2017, 11/03/2016 CT neck. 01/06/2018 PET-CT. FINDINGS: Pharynx and larynx: Low-attenuation thickening of supraglottic mucosa, aryepiglottic folds, epiglottis. Linear non masslike enhancement of superficial laryngeal mucosa. No discrete mass lesion identified. Patent airway. Salivary glands: No inflammation, mass, or stone. Thyroid: Normal. Lymph nodes: Stable right supraclavicular lymph node measuring 15 x 9 mm (series 2, image 79). Additional cervical lymph nodes are subcentimeter and stable. Vascular: Mixed plaque of the bilateral carotid bifurcations. Mild left proximal ICA stenosis. Limited intracranial: Negative. Visualized orbits: Negative. Mastoids and visualized paranasal sinuses: Clear. Skeleton: Advanced multilevel cervical spondylosis with moderate to severe spinal canal stenosis at the C5-6 level. Uncovertebral and facet hypertrophy encroaches on neural foramen throughout the cervical spine. Upper chest: Waxing and waning clustered ground-glass nodules in bronchovascular distribution in the right upper lobe likely related to bronchiolitis. Moderate emphysema. Other: None. IMPRESSION: 1. Linear non masslike enhancement of superficial supraglottic mucosa as well as low-attenuation mucosal thickening of supraglottic mucosa and epiglottis is stable from prior CT of the neck. Findings likely represent posttreatment changes. No discrete mass identified. Patent airway. 2. Stable cervical lymph nodes. 3. 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 4. Waxing and waning ground-glass nodules in right upper lobe, likely minimal bronchiolitis. 5. Emphysema. Electronically Signed   By: Kristine Garbe  M.D.   On: 03/10/2018 16:44   Ct Angio Chest Pe W And/or Wo Contrast  Result Date: 03/10/2018 CLINICAL DATA:  Chills. Recent diagnosis of pneumonia. History of head and neck cancer. EXAM: CT ANGIOGRAPHY CHEST WITH CONTRAST TECHNIQUE: Multidetector CT imaging of the chest was performed using the standard protocol during bolus administration of intravenous contrast. Multiplanar CT image reconstructions and MIPs were obtained to evaluate the vascular anatomy. CONTRAST:  168mL ISOVUE-370 IOPAMIDOL (ISOVUE-370) INJECTION 76% COMPARISON:  Chest radiograph March 10, 2018 and CT chest January 31, 2018 FINDINGS: CARDIOVASCULAR: Adequate contrast opacification of the pulmonary artery's. Main pulmonary artery is not enlarged. No pulmonary arterial filling defects to the level of the subsegmental branches. Heart size is normal, no right heart strain. No pericardial effusion. Thoracic aorta is normal course and caliber, mild calcific atherosclerosis. MEDIASTINUM/NODES: No lymphadenopathy by CT size criteria. LUNGS/PLEURA: Tracheobronchial tree is patent, no pneumothorax. Worsening tree-in-bud infiltrates and multifocal consolidation with a basilar predominance. No discrete pulmonary nodule in LEFT lower lobe. No pleural effusion. Moderate centrilobular emphysema with increased lung volumes, flattened hemidiaphragms. UPPER ABDOMEN: Non-acute. MUSCULOSKELETAL: Non-acute. Review of the MIP images confirms the above findings. IMPRESSION: 1. Worsening tree-in-bud infiltrates and focal consolidation consistent with multifocal pneumonia, given basilar predominance, aspiration is possible. Aortic Atherosclerosis (ICD10-I70.0) and Emphysema (ICD10-J43.9). Electronically Signed   By: Elon Alas M.D.   On: 03/10/2018 16:43    Review of Systems  Constitutional: Positive for malaise/fatigue and weight loss. Negative for chills and fever.  HENT: Negative.   Eyes: Negative.   Respiratory: Negative for shortness of breath  (No shortness of breath at this time).   Cardiovascular: Negative for chest pain.  Gastrointestinal: Positive for nausea. Negative for diarrhea and vomiting.  Genitourinary: Negative.   Musculoskeletal: Negative.   Skin: Negative.   Neurological: Negative for dizziness and loss of consciousness.  Endo/Heme/Allergies: Does not bruise/bleed easily.  Psychiatric/Behavioral: Negative.   All other systems reviewed and are negative.  Blood pressure (!) 98/57, pulse 61, temperature 97.8 F (36.6 C), temperature source Oral, resp. rate 13, height 5\' 6"  (1.676 m), weight 52.1 kg, SpO2 94 %. Physical Exam  Nursing note and vitals reviewed. Constitutional: He is oriented to person, place, and time. No distress.  Undernourished   HENT:  Head: Normocephalic and atraumatic.  Eyes: Conjunctivae are normal.  Neck: JVD (Positive Kussmaul's sign) present.  Cardiovascular: Normal rate and regular rhythm.  Murmur heard.  Systolic (RUSB) murmur is present with a grade of 1/6. Respiratory: Effort normal and breath sounds normal. He has no wheezes. He has no rales.  GI: Bowel sounds are normal.  Rigid   Musculoskeletal:        General: No edema.  Neurological: He is alert and oriented to person, place, and time.  Skin: Skin is warm and dry.  Psychiatric: He has a normal mood and affect.    EKG 03/10/2018: Sinus rhythm 83 bpm. Normal axis. Normal conduction. Right atrial enlargement. No acute ischemic changes.   Echocardiogram 03/11/2018: - Study data: Technically challenging imaging due to heart   position, Doppler alignment angles may underestimate true   measurements. - Left ventricle: The cavity size was normal. Systolic function was   normal. The estimated ejection fraction was in the range of 55%   to 60%. Wall motion was normal; there were no regional wall   motion abnormalities. The study is not technically sufficient to   allow evaluation of LV diastolic function. - Ventricular  septum: Respiratory related leftward septal shift. - Aortic valve: Sclerosis without stenosis. There was no   regurgitation. Mean gradient (S): 9 mm Hg. - Aorta: The ascending aorta was mildly calcified. - Mitral valve: Mildly thickened anterior leaflet. There was   trivial regurgitation. - Left atrium: The atrium was mildly dilated. - Right ventricle: The cavity size was mildly to moderately   dilated. Wall thickness was normal. Systolic function was mildly   reduced. RV systolic pressure (S, est): 23 mm Hg. - Right atrium: The atrium was mildly dilated. Prominent Chiari   network (normal variant). Central venous pressure (est): 15 mm   Hg. - Tricuspid valve: There was trivial regurgitation. - Pulmonary arteries: Systolic pressure was within the normal   range. - Pericardium, extracardiac: A trivial pericardial effusion was   identified.  Impressions:  - Mild respiratory related leftward septal shift with prominence of   the pericardium posteriorly, with dilated IVC. On review of   clinical history, cannot exclude pericardial constriction.  Recommendations:  Consider cardiac MRI with free breathing sequences and gadolinium contrast to assess for pericardial constriction if clinically indicated.   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  Shortness of breath: Currently resolved. Echocardiogram personally reviewed by me. Physical exam consistent with JVD, Kussmaul's sign, echocardiogram with septal shift, pericardial thickening, annulus inversus, point towards pericardial constriction. Mild troponin elevation possible ACS.  Cardiac MRI could add further imaging evidence. Left and right heart cath with coronary angiography will be ideal study from both diagnostic and therapeutic purpose. However, I am concerned about patient's  overall medical condition, malnourishment owing  to difficulty swallowing.  Due to these reasons, I do not think he will tolerate major surgery like pericardial constriction. Also, I am unsure he can tolerate DAPT given his swallowing difficulty. At this time, I recommend aspirin and plavix for medical management and ensure compliance. I would only recommend invasive workup and management should his overall condition improve.   J  03/11/2018, 4:21 PM   [place], MD [facility] Pager: [phone] Office: [phone] If no answer Cell [phone]

## 2018-03-11 NOTE — Progress Notes (Signed)
Initial Nutrition Assessment  DOCUMENTATION CODES:   Severe malnutrition in context of chronic illness  INTERVENTION:    Ensure Enlive po TID, each supplement provides 350 kcal and 20 grams of protein  NUTRITION DIAGNOSIS:   Severe Malnutrition related to chronic illness(COPD, hx of esophageal cancer) as evidenced by severe fat depletion, severe muscle depletion  GOAL:   Patient will meet greater than or equal to 90% of their needs  MONITOR:   PO intake, Supplement acceptance, Labs, Weight trends, Skin, I & O's  REASON FOR ASSESSMENT:   Consult Assessment of nutrition requirement/status  ASSESSMENT:   57 yo Male with PMH esophageal cancer s/p radiation ending 7 months ago, malnutrition, 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   Pt laying in bed; does not make eye contact with RD.  He's very hoarse; unable to obtain nutrition hx.  Per H&P with has mouth sores that make it difficult to eat/drink. He's also unable to eat much and feels like he chokes on liquids.  S/p swallow evaluation today; SLP rec Dys 2-Nectar thick liquids.  Labs & medications reviewed. Na 131 (L). Phos 1.6 (L).  NUTRITION - FOCUSED PHYSICAL EXAM:    Most Recent Value  Orbital Region  Moderate depletion  Upper Arm Region  Severe depletion  Thoracic and Lumbar Region  Unable to assess  Buccal Region  Moderate depletion  Temple Region  Moderate depletion  Clavicle Bone Region  Severe depletion  Clavicle and Acromion Bone Region  Severe depletion  Scapular Bone Region  Severe depletion  Dorsal Hand  Moderate depletion  Patellar Region  Severe depletion  Anterior Thigh Region  Severe depletion  Posterior Calf Region  Severe depletion  Edema (RD Assessment)  None     Diet Order:   Diet Order            DIET DYS 2 Room service appropriate? Yes; Fluid  consistency: Nectar Thick  Diet effective now             EDUCATION NEEDS:   Not appropriate for education at this time  Skin:  Skin Assessment: Reviewed RN Assessment  Last BM:  PTA  Height:   Ht Readings from Last 1 Encounters:  03/10/18 5\' 6"  (1.676 m)   Weight:   Wt Readings from Last 1 Encounters:  03/10/18 52.1 kg   BMI:  Body mass index is 18.54 kg/m.  Estimated Nutritional Needs:   Kcal:  1600-1800  Protein:  80-95 gm  Fluid:  1.6-1.8 L  Arthur Holms, RD, LDN Pager #: [phone] After-Hours Pager #: [phone]

## 2018-03-11 NOTE — Plan of Care (Signed)

## 2018-03-12 ENCOUNTER — Encounter (HOSPITAL_COMMUNITY): Payer: Self-pay

## 2018-03-12 ENCOUNTER — Inpatient Hospital Stay (HOSPITAL_COMMUNITY): Payer: Medicaid Other

## 2018-03-12 DIAGNOSIS — Z8501 Personal history of malignant neoplasm of esophagus: Secondary | ICD-10-CM

## 2018-03-12 DIAGNOSIS — R1312 Dysphagia, oropharyngeal phase: Secondary | ICD-10-CM

## 2018-03-12 LAB — COMPREHENSIVE METABOLIC PANEL
ALT: 249 U/L — ABNORMAL HIGH (ref 0–44)
AST: 255 U/L — ABNORMAL HIGH (ref 15–41)
Albumin: 1.9 g/dL — ABNORMAL LOW (ref 3.5–5.0)
Alkaline Phosphatase: 43 U/L (ref 38–126)
Anion gap: 7 (ref 5–15)
BUN: 16 mg/dL (ref 6–20)
CO2: 31 mmol/L (ref 22–32)
Calcium: 8 mg/dL — ABNORMAL LOW (ref 8.9–10.3)
Chloride: 96 mmol/L — ABNORMAL LOW (ref 98–111)
Creatinine, Ser: 0.49 mg/dL — ABNORMAL LOW (ref 0.61–1.24)
GFR calc Af Amer: >60 mL/min (ref >60)
GFR calc non Af Amer: >60 mL/min (ref >60)
Glucose, Bld: 71 mg/dL (ref 70–99)
Potassium: 4.7 mmol/L (ref 3.5–5.1)
Sodium: 134 mmol/L — ABNORMAL LOW (ref 135–145)
Total Bilirubin: 0.7 mg/dL (ref 0.3–1.2)
Total Protein: 5.5 g/dL — ABNORMAL LOW (ref 6.5–8.1)

## 2018-03-12 LAB — HEPATITIS B SURFACE ANTIGEN: Hepatitis B Surface Ag: NEGATIVE

## 2018-03-12 LAB — CBC
HCT: 32.1 % — ABNORMAL LOW (ref 39.0–52.0)
Hemoglobin: 9.9 g/dL — ABNORMAL LOW (ref 13.0–17.0)
MCH: 29.5 pg (ref 26.0–34.0)
MCHC: 30.8 g/dL (ref 30.0–36.0)
MCV: 95.5 fL (ref 80.0–100.0)
Platelets: 193 10*3/uL (ref 150–400)
RBC: 3.36 MIL/uL — ABNORMAL LOW (ref 4.22–5.81)
RDW: 12.5 % (ref 11.5–15.5)
WBC: 9.2 10*3/uL (ref 4.0–10.5)
nRBC: 0 % (ref 0.0–0.2)

## 2018-03-12 LAB — HCV AB W REFLEX TO QUANT PCR: HCV Ab: <0.1 s/co ratio (ref 0.0–0.9)

## 2018-03-12 LAB — HEPATITIS B SURFACE ANTIBODY,QUALITATIVE: Hep B S Ab: NONREACTIVE

## 2018-03-12 LAB — MAGNESIUM: Magnesium: 1.7 mg/dL (ref 1.7–2.4)

## 2018-03-12 LAB — PHOSPHORUS: Phosphorus: 2 mg/dL — ABNORMAL LOW (ref 2.5–4.6)

## 2018-03-12 LAB — HCV INTERPRETATION

## 2018-03-12 LAB — HEPATITIS B CORE ANTIBODY, TOTAL: Hep B Core Total Ab: NEGATIVE

## 2018-03-12 MED ORDER — POLYETHYLENE GLYCOL 3350 17 G PO PACK
17.0000 g | PACK | Freq: Every day | ORAL | Status: DC
  Administered 2018-03-12 – 2018-03-13 (×2): 17 g via ORAL
  Filled 2018-03-12 (×3): qty 1

## 2018-03-12 MED ORDER — SODIUM CHLORIDE 0.9 % IV SOLN
INTRAVENOUS | Status: DC
  Administered 2018-03-12: 09:00:00 via INTRAVENOUS

## 2018-03-12 MED ORDER — SODIUM CHLORIDE 0.9 % IV SOLN: INTRAVENOUS | Status: DC

## 2018-03-12 MED ORDER — SODIUM PHOSPHATES 45 MMOLE/15ML IV SOLN
30.0000 mmol | Freq: Once | INTRAVENOUS | Status: AC
  Administered 2018-03-12: 30 mmol via INTRAVENOUS
  Filled 2018-03-12: qty 10

## 2018-03-12 MED ORDER — SENNOSIDES-DOCUSATE SODIUM 8.6-50 MG PO TABS
1.0000 | ORAL_TABLET | Freq: Two times a day (BID) | ORAL | Status: DC
  Administered 2018-03-12 – 2018-03-14 (×4): 1 via ORAL
  Filled 2018-03-12 (×6): qty 1

## 2018-03-12 MED ORDER — MAGNESIUM SULFATE 4 GM/100ML IV SOLN
4.0000 g | Freq: Once | INTRAVENOUS | Status: AC
  Administered 2018-03-12: 4 g via INTRAVENOUS
  Filled 2018-03-12: qty 100

## 2018-03-12 MED ORDER — CLOPIDOGREL BISULFATE 75 MG PO TABS
75.0000 mg | ORAL_TABLET | Freq: Every day | ORAL | Status: DC
  Administered 2018-03-12 – 2018-03-14 (×3): 75 mg via ORAL
  Filled 2018-03-12 (×4): qty 1

## 2018-03-12 NOTE — Progress Notes (Signed)
  Speech Language Pathology Treatment: Dysphagia  Patient Details Name: Tony Berry MRN: [id number] DOB: 02/23/1961 Today's Date: 03/12/2018 Time: 9211-9417 SLP Time Calculation (min) (ACUTE ONLY): 20 min  Assessment / Plan / Recommendation Clinical Impression  Pt seen for further treatment after MBS for education and precautions.  Pt sleepy, requires multiple repetitions until he can verbalize what aspiration means and the risk it poses.  Pt required min verbal cues to regulate bite/sip size, use effortful swallow, with intermittent throat clearing.  Provided further instruction in supraglottic swallow and Mendelsohn maneuver in the event these strategies may be effective to help reduce aspiration if pt can execute them (mod verbal/visual cues needed for follow-through).  SLP will continue to follow.  Nectar-thick liquids are not a long-term solution but may minimize degree of aspiration during acute hospitalization.    HPI HPI: Mr. Giannotti is a 57 yo male with PMH esophageal cancer s/p radiation ending 7 months ago, malnutrition, chronic back pain on methadone with recent admission to [facility] one month ago for pneumonia presenting with SOB and weakness that has been worsening since previous discharge. Chest CT 12/18: "Worsening tree-in-bud infiltrates and focal consolidation consistent with multifocal pneumonia, given basilar predominance, aspiration is possible."  Pt reports being placed on thickened liquid during admission to [facility], but could not recall consistency.  Pt reports most recent swallow assessment by [facility] 3-4 months previously with no abnormal findings.      SLP Plan  Continue with current plan of care       Recommendations  Diet recommendations: Dysphagia 2 (fine chop);Nectar-thick liquid Liquids provided via: Cup;Straw Medication Administration: Crushed with puree Supervision: Patient able to self feed Compensations: Small sips/bites;Clear throat intermittently                 Oral Care Recommendations: Oral care BID Follow up Recommendations: Other (comment)(tba) SLP Visit Diagnosis: Dysphagia, pharyngeal phase (R13.13) Plan: Continue with current plan of care       GO                [name] 03/12/2018, 2:50 PM  Toleen Lachapelle L. [name], [place] Office number [phone]

## 2018-03-12 NOTE — Progress Notes (Signed)
   Subjective:   Mr. Haydel reports that he feels well this morning. His dysphagia and hoarseness have also improved. He was found eating breakfast this morning and has been able to tolerate p.o. intake with no difficulties and feels the softer diet works better. He has not had a bowel movement since admission.   Objective:  Vital signs in last 24 hours: Vitals:   03/11/18 1900 03/11/18 2100 03/11/18 2311 03/12/18 0502  BP: (!) 93/59  101/64 (!) 99/58  Pulse: 82 74 74 72  Resp: 17 16 14  (!) 21  Temp: 98 F (36.7 C)  97.7 F (36.5 C) 98.1 F (36.7 C)  TempSrc:   Oral Oral  SpO2: 94% 93% 90% 95%  Weight:      Height:       Physical Exam:  Constitution: NAD, cachectic Cardio: RRR, no m/r/g, decreased heart sounds Respiratory: 2L [place], decreased breath sounds left, wheezing left, otherwise CTA Skin: c/d/i    Assessment/Plan:  Principal Problem:   Multifocal pneumonia Active Problems:   Transaminitis   Troponin level elevated   Protein-calorie malnutrition, severe   Hyponatremia   Hypophosphatemia  57 yo male with PMH esophageal cancer s/p radiation ending 7 months ago, malnutrition, chronic back pain on methadone with recent admission to [facility] one month ago for pneumonia who presented with SOB and weakness that had been worsening since previous discharge.  Multifocal Aspiration Pneumonia COPD Lung Mass Continuing to improve. Weaned to [facility] at 93% on rounds. Leukocytosis resolved. Discussed with patient that soft bp is normal for him around upper 43X systolic. Was supposed to follow-up with Dr. Alcide Clever in [place] with pulmonology after hospitalization at [facility] for lung nodules but was unable to have biopsy and treatment covered by insurance per wife.   - Wean O2 for sats >90% - d/c metronidazole - cont. Cefepime 7 days total  - holding IVF as softer bp is normotensive for him - duo-nebs q6h prn - am CBC  Constriction Pericarditis Possible secondary to  previous radiation. Cardiology consulted, recommending plavix and asa. He is unable to tolerate RHC at this time, but may be done if his overall condition improves.   - cardiology consulted, appreciate recommendations  - cont. Asa 81 - start plavix 75mg  qd   Transaminitis HCV, HBV negative   - HSV pcr pending  - am CMP  Hyponatremia Hypokalemia Continuing to replete as necessary. Possibly secondary to refeeding syndrome as he is quite cachectic and previously had trouble swallowing food and drink at home.   - am Mg, Phos, CMP - Mg & Phos repleted today   History of Esophageal Cancer/Mouth Sores Malnutrition Tolerating dysphagia 2 diet well. Mouth lesions in buccal area appear opaque, non bloody and to be healing.   - HSV pcr pending  - dysphagia 2 diet - cont. Magic mouthwash  OUD Has not yet had a BM since admission   - cont. 115 mg methadone liquid - senokot scheduled to bid, added miralax  VTE: lovenox IVF: none Diet: dysphagia 2 Code: DNR  Dispo: Anticipated discharge in approximately 2-3 days.   Molli Hazard A, DO 03/12/2018, 6:40 AM Pager: [phone]

## 2018-03-12 NOTE — Consult Note (Signed)
Reason for Consult: Dysphagia, history of esophageal cancer  HPI:  Tony Berry is an 57 y.o. male who was admitted 2 days ago for treatment of his pneumonia. The patient has a history of throat cancer status post radiation therapy 6 months ago. He is currently being followed by his ENT physician Dr. Cletis Athens in [place], [place]. He was hospitalization at [facility] 3 weeks ago for pneumonia. Patient was discharged home on oral antibiotics which he has since completed. Patient continues to have shortness of breath, hoarseness and wheezing despite his inhaler use. He denies any fevers at home. He has difficulty swallowing since his radiation therapy. On arrival to the ED, a his neck CT scan showed no discrete mass. Post-treatment changes of his larynx were noted. Of note, patient reportedly had CT chest done 4 months ago by his oncologist which showed "new lung masses". They have yet to be biopsied.  Past Medical History:  Diagnosis Date  . Hypertension     Past Surgical History:  Procedure Laterality Date  . BACK SURGERY    . FOOT SURGERY      No family history on file.  Social History:  reports that he quit smoking about 2 weeks ago. His smoking use included cigarettes. He started smoking about 30 years ago. He has a 30.00 pack-year smoking history. He has never used smokeless tobacco. He reports that he does not drink alcohol or use drugs.  Allergies: No Known Allergies  Prior to Admission medications   Medication Sig Start Date End Date Taking? Authorizing Provider  METHADONE HCL PO Take 115 mg by mouth once a week.   Yes [provider]  fluticasone (FLONASE) 50 MCG/ACT nasal spray Place 1 spray into both nostrils daily.    [provider]  Fluticasone-Umeclidin-Vilant (TRELEGY ELLIPTA) 100-62.5-25 MCG/INH AEPB Inhale 1 puff into the lungs daily.    [provider]    Medications:  I have reviewed the patient's current medications. Scheduled: .  aspirin  81 mg Oral Daily  . clopidogrel  75 mg Oral Daily  . enoxaparin (LOVENOX) injection  40 mg Subcutaneous Q24H  . feeding supplement (ENSURE ENLIVE)  237 mL Oral TID BM  . Influenza vac split quadrivalent PF  0.5 mL Intramuscular Tomorrow-1000  . magic mouthwash w/lidocaine  5 mL Oral TID  . mouth rinse  15 mL Mouth Rinse BID  . methadone  115 mg Oral Daily  . nicotine  21 mg Transdermal Daily  . polyethylene glycol  17 g Oral Daily  . senna-docusate  1 tablet Oral BID   Continuous: . ceFEPime (MAXIPIME) IV 1 g (03/12/18 1800)    Results for orders placed or performed during the hospital encounter of 03/10/18 (from the past 48 hour(s))  Culture, blood (routine x 2)     Status: None (Preliminary result)   Collection Time: 03/10/18  4:59 PM  Result Value Ref Range   Specimen Description BLOOD RIGHT ANTECUBITAL    Special Requests      BOTTLES DRAWN AEROBIC AND ANAEROBIC Blood Culture adequate volume   Culture      NO GROWTH 2 DAYS Performed at [facility] [address] [place], [place] [address]    Report Status PENDING   Culture, blood (routine x 2)     Status: None (Preliminary result)   Collection Time: 03/10/18  5:01 PM  Result Value Ref Range   Specimen Description BLOOD LEFT ANTECUBITAL    Special Requests  BOTTLES DRAWN AEROBIC AND ANAEROBIC Blood Culture adequate volume   Culture      NO GROWTH 2 DAYS Performed at [facility], [place] [address] [place], [place] [address]    Report Status PENDING   Basic metabolic panel     Status: Abnormal   Collection Time: 03/10/18  7:36 PM  Result Value Ref Range   Sodium 128 (L) 135 - 145 mmol/L   Potassium 4.4 3.5 - 5.1 mmol/L    Comment: DELTA CHECK NOTED   Chloride 88 (L) 98 - 111 mmol/L   CO2 31 22 - 32 mmol/L   Glucose, Bld 122 (H) 70 - 99 mg/dL   BUN 32 (H) 6 - 20 mg/dL   Creatinine, Ser 0.82 0.61 - 1.24 mg/dL   Calcium 8.0 (L) 8.9 - 10.3 mg/dL   GFR calc non Af Amer >60 >60 mL/min   GFR  calc Af Amer >60 >60 mL/min   Anion gap 9 5 - 15    Comment: Performed at [facility], [place] [address] [place], [place] [address]  Troponin I - Once     Status: Abnormal   Collection Time: 03/10/18  7:36 PM  Result Value Ref Range   Troponin I 0.90 (HH) <0.03 ng/mL    Comment: CRITICAL RESULT CALLED TO, READ BACK BY AND VERIFIED WITH: B.SMITH,RN 2053 03/10/18 M.CAMPBELL Performed at [facility], [place] [address], [place], [place] [address]   TSH     Status: None   Collection Time: 03/11/18  5:18 AM  Result Value Ref Range   TSH 1.946 0.350 - 4.500 uIU/mL    Comment: Performed by a 3rd Generation assay with a functional sensitivity of <=0.01 uIU/mL. Performed at [facility], [place] [address], [place], [place] [address]   Comprehensive metabolic panel     Status: Abnormal   Collection Time: 03/11/18  5:18 AM  Result Value Ref Range   Sodium 131 (L) 135 - 145 mmol/L   Potassium 4.4 3.5 - 5.1 mmol/L   Chloride 92 (L) 98 - 111 mmol/L   CO2 30 22 - 32 mmol/L   Glucose, Bld 94 70 - 99 mg/dL   BUN 24 (H) 6 - 20 mg/dL   Creatinine, Ser 0.51 (L) 0.61 - 1.24 mg/dL   Calcium 8.0 (L) 8.9 - 10.3 mg/dL   Total Protein 5.9 (L) 6.5 - 8.1 g/dL   Albumin 2.0 (L) 3.5 - 5.0 g/dL   AST 590 (H) 15 - 41 U/L   ALT 360 (H) 0 - 44 U/L   Alkaline Phosphatase 42 38 - 126 U/L   Total Bilirubin 0.6 0.3 - 1.2 mg/dL   GFR calc non Af Amer >60 >60 mL/min   GFR calc Af Amer >60 >60 mL/min   Anion gap 9 5 - 15    Comment: Performed at [facility], [place] [address] [place], [place] [address]  HIV antibody (Routine Testing)     Status: None   Collection Time: 03/11/18  5:18 AM  Result Value Ref Range   HIV Screen 4th Generation wRfx Non Reactive Non Reactive    Comment: (NOTE) Performed At: [facility] [place], [place] [id number] Rush Farmer MD CZ:[id number]   Cortisol-am, blood     Status: None   Collection Time: 03/11/18  5:18 AM  Result Value Ref Range    Cortisol - AM 20.8 6.7 - 22.6 ug/dL    Comment: Performed at [facility], [place] [address] [place], [place] [address]  Troponin I - Tomorrow AM 0500     Status: Abnormal   Collection Time: 03/11/18  5:18 AM  Result Value Ref Range   Troponin I 0.63 (HH) <0.03 ng/mL    Comment: CRITICAL VALUE NOTED.  VALUE IS CONSISTENT WITH PREVIOUSLY REPORTED AND CALLED VALUE. Performed at [facility], [place] [address] [place], [place] [address]   Lactic acid, plasma     Status: None   Collection Time: 03/11/18  7:45 AM  Result Value Ref Range   Lactic Acid, Venous 0.9 0.5 - 1.9 mmol/L    Comment: Performed at [facility] [address] [place], [place] [address]  MRSA PCR Screening     Status: None   Collection Time: 03/11/18  9:32 AM  Result Value Ref Range   MRSA by PCR NEGATIVE NEGATIVE    Comment:        The GeneXpert MRSA Assay (FDA approved for NASAL specimens only), is one component of a comprehensive MRSA colonization surveillance program. It is not intended to diagnose MRSA infection nor to guide or monitor treatment for MRSA infections. Performed at [facility], [place] [address], [place], [place] [address]   Lactic acid, plasma     Status: None   Collection Time: 03/11/18 10:19 AM  Result Value Ref Range   Lactic Acid, Venous 0.8 0.5 - 1.9 mmol/L    Comment: Performed at [facility] [address] [place], [place] [address]  Blood gas, venous     Status: Abnormal   Collection Time: 03/11/18 10:19 AM  Result Value Ref Range   pH, Ven 7.350 7.250 - 7.430   pCO2, Ven 59.2 44.0 - 60.0 mmHg   pO2, Ven 62.8 (H) 32.0 - 45.0 mmHg   Bicarbonate 31.8 (H) 20.0 - 28.0 mmol/L   Acid-Base Excess 6.4 (H) 0.0 - 2.0 mmol/L   O2 Saturation 90.5 %   Patient temperature 98.6    Collection site VEIN    Drawn by DRAWN BY RN    Sample type VENOUS   CBC     Status: Abnormal   Collection Time: 03/11/18  2:35 PM  Result Value Ref Range   WBC 13.8 (H) 4.0 -  10.5 K/uL   RBC 3.43 (L) 4.22 - 5.81 MIL/uL   Hemoglobin 10.1 (L) 13.0 - 17.0 g/dL   HCT 31.9 (L) 39.0 - 52.0 %   MCV 93.0 80.0 - 100.0 fL   MCH 29.4 26.0 - 34.0 pg   MCHC 31.7 30.0 - 36.0 g/dL   RDW 12.4 11.5 - 15.5 %   Platelets 180 150 - 400 K/uL   nRBC 0.0 0.0 - 0.2 %    Comment: Performed at [facility], [place] [address], [place],  [address]  Magnesium     Status: None   Collection Time: 03/11/18  2:35 PM  Result Value Ref Range   Magnesium 1.9 1.7 - 2.4 mg/dL    Comment: Performed at [facility], [place] [address] [place],  [address]  Phosphorus     Status: Abnormal   Collection Time: 03/11/18  2:35 PM  Result Value Ref Range   Phosphorus 1.6 (L) 2.5 - 4.6 mg/dL    Comment: Performed at [facility] [address], [place],  [address]  Protime-INR     Status: Abnormal   Collection Time: 03/11/18  2:35 PM  Result  Value Ref Range   Prothrombin Time 17.1 (H) 11.4 - 15.2 seconds   INR 1.41     Comment: Performed at [facility], [place] [address], [place], [place] [address]  HCV Ab Reflex to Quant PCR     Status: None   Collection Time: 03/11/18  2:35 PM  Result Value Ref Range   HCV Ab <0.1 0.0 - 0.9 s/co ratio    Comment: (NOTE) Performed At: [facility] [address] [place], [place] [id number] Rush Farmer MD OF:[id number]   Hepatitis B core antibody, total     Status: None   Collection Time: 03/11/18  2:35 PM  Result Value Ref Range   Hep B Core Total Ab Negative Negative    Comment: (NOTE) Performed At: [facility] [place], [place] [id number] Rush Farmer MD RW:[id number]   Hepatitis B surface antigen     Status: None   Collection Time: 03/11/18  2:35 PM  Result Value Ref Range   Hepatitis B Surface Ag Negative Negative    Comment: (NOTE) Performed At: [facility] [place], [place] [id number] Rush Farmer MD [id number]   Hepatitis B surface  antibody,qualitative     Status: None   Collection Time: 03/11/18  2:35 PM  Result Value Ref Range   Hep B S Ab Non Reactive     Comment: (NOTE)              Non Reactive: Inconsistent with immunity,                            less than 10 mIU/mL              Reactive:     Consistent with immunity,                            greater than 9.9 mIU/mL Performed At: [facility] [place], [place] [id number] Rush Farmer MD [id number]   Interpretation:     Status: None   Collection Time: 03/11/18  2:35 PM  Result Value Ref Range   HCV Interp 1: Comment     Comment: (NOTE) Negative Not infected with HCV, unless recent infection is suspected or other evidence exists to indicate HCV infection. Performed At: [facility] [place], [place] [id number] Rush Farmer MD [id number]   CBC     Status: Abnormal   Collection Time: 03/12/18  3:55 AM  Result Value Ref Range   WBC 9.2 4.0 - 10.5 K/uL   RBC 3.36 (L) 4.22 - 5.81 MIL/uL   Hemoglobin 9.9 (L) 13.0 - 17.0 g/dL   HCT 32.1 (L) 39.0 - 52.0 %   MCV 95.5 80.0 - 100.0 fL   MCH 29.5 26.0 - 34.0 pg   MCHC 30.8 30.0 - 36.0 g/dL   RDW 12.5 11.5 - 15.5 %   Platelets 193 150 - 400 K/uL   nRBC 0.0 0.0 - 0.2 %    Comment: Performed at [facility], [place] [address] [place],  [address]  Comprehensive metabolic panel     Status: Abnormal   Collection Time: 03/12/18  3:55 AM  Result Value Ref Range   Sodium 134 (L) 135 - 145 mmol/L   Potassium 4.7 3.5 - 5.1 mmol/L   Chloride 96 (L) 98 - 111 mmol/L   CO2 31 22 - 32  mmol/L   Glucose, Bld 71 70 - 99 mg/dL   BUN 16 6 - 20 mg/dL   Creatinine, Ser 0.49 (L) 0.61 - 1.24 mg/dL   Calcium 8.0 (L) 8.9 - 10.3 mg/dL   Total Protein 5.5 (L) 6.5 - 8.1 g/dL   Albumin 1.9 (L) 3.5 - 5.0 g/dL   AST 255 (H) 15 - 41 U/L   ALT 249 (H) 0 - 44 U/L   Alkaline Phosphatase 43 38 - 126 U/L   Total Bilirubin 0.7 0.3 - 1.2 mg/dL   GFR calc non Af Amer  >60 >60 mL/min   GFR calc Af Amer >60 >60 mL/min   Anion gap 7 5 - 15    Comment: Performed at [facility], [address] [place], [place] [address]  Magnesium     Status: None   Collection Time: 03/12/18  3:55 AM  Result Value Ref Range   Magnesium 1.7 1.7 - 2.4 mg/dL    Comment: Performed at [facility] [address], [place], [place] [address]  Phosphorus     Status: Abnormal   Collection Time: 03/12/18  3:55 AM  Result Value Ref Range   Phosphorus 2.0 (L) 2.5 - 4.6 mg/dL    Comment: Performed at [facility] [address] [place], [place] [address]    Dg Swallowing Func-speech Pathology  Result Date: 03/12/2018 Objective Swallowing Evaluation: Type of Study: MBS-Modified Barium Swallow Study  Patient Details Name: SEVERUS BRODZINSKI MRN: [id number] Date of Birth: 1961-02-08 Today's Date: 03/12/2018 Time: SLP Start Time (ACUTE ONLY): 48 -SLP Stop Time (ACUTE ONLY): 1200 SLP Time Calculation (min) (ACUTE ONLY): 30 min Past Medical History: Past Medical History: Diagnosis Date . Hypertension  Past Surgical History: Past Surgical History: Procedure Laterality Date . BACK SURGERY   . FOOT SURGERY   HPI: Mr. Dubberly is a 57 yo male with PMH esophageal cancer s/p radiation ending 7 months ago, malnutrition, chronic back pain on methadone with recent admission to [facility] one month ago for pneumonia presenting with SOB and weakness that has been worsening since previous discharge. Chest CT 12/18: "Worsening tree-in-bud infiltrates and focal consolidation consistent with multifocal pneumonia, given basilar predominance, aspiration is possible."  Pt reports being placed on thickened liquid during admission to [facility], but could not recall consistency.  Pt reports most recent swallow assessment by FEES 3-4 months previously with no abnormal findings.  Subjective: alert, participatory Assessment / Plan / Recommendation Pt presents with a chronic pharyngeal dysphagia that  appears to be deteriorating per pt's account.  There are areas of thickening/abnormal soft tissue of pharyngeal wall, epiglottis, and periarytenoids - this can be visualized when coated with barium.  Pt demonstrates reduced velopharyngeal approximation. He is unable to achieve laryngeal vestibular closure, which leads to consistent penetration of all boluses into the larynx.  A portion of this material passes through vocal folds and into trachea.  Aspiration elicits a cough.  He is able to eject a portion back into pharynx which passes through UES.  There is aspiration of all consistences - liquid and solid - either before or after the swallow event.  He attempted multiple postural adjustments and strategies to improve airway protection (head tilts and turns left and right, chin tuck, supraglottic swallow) -  these were not effective. He expectorates residuals from pharynx.  Pt's voice is generally aphonic with some ability to achieve voice with effort.     We discussed the MBS and reviewed  the video in real time and afterward in his room.  Pt wants to continue eating; he verbalizes understanding that a percentage of all that he eats/drinks is aspirated; he is willing to consider a PEG again for improved nutrition.  Discussed results with Dr. Rebeca Alert. Deterioration in swallow function can be attributable to late effects of radiation/fibrosis, but pt needs to be seen either by his primary ENT or an ENT here for further w/u, repeat laryngoscopy given anatomy and voice changes.  Continue soft diet, nectar-thick liquids for now (nectars tolerated marginally better than thin liquids).  SLP will follow for plan, further education with pt.    CHL IP TREATMENT RECOMMENDATION 03/12/2018 Treatment Recommendations Defer until completion of intrumental exam;Therapy as outlined in treatment plan below   Prognosis 03/12/2018 Prognosis for Safe Diet Advancement Guarded Barriers to Reach Goals -- Barriers/Prognosis Comment -- CHL  IP DIET RECOMMENDATION 03/12/2018 SLP Diet Recommendations Dysphagia 2 (Fine chop) solids;Nectar thick liquid Liquid Administration via Cup Medication Administration Crushed with puree Compensations Small sips/bites;Clear throat intermittently Postural Changes Remain semi-upright after after feeds/meals (Comment)   CHL IP OTHER RECOMMENDATIONS 03/12/2018 Recommended Consults Consider ENT evaluation Oral Care Recommendations Oral care BID Other Recommendations Order thickener from pharmacy   CHL IP FOLLOW UP RECOMMENDATIONS 03/12/2018 Follow up Recommendations Other (comment)   CHL IP FREQUENCY AND DURATION 03/12/2018 Speech Therapy Frequency (ACUTE ONLY) min 2x/week Treatment Duration 1 week      CHL IP ORAL PHASE 03/12/2018 Oral Phase WFL Oral - Pudding Teaspoon -- Oral - Pudding Cup -- Oral - Honey Teaspoon -- Oral - Honey Cup -- Oral - Nectar Teaspoon -- Oral - Nectar Cup -- Oral - Nectar Straw -- Oral - Thin Teaspoon -- Oral - Thin Cup -- Oral - Thin Straw -- Oral - Puree -- Oral - Mech Soft -- Oral - Regular -- Oral - Multi-Consistency -- Oral - Pill -- Oral Phase - Comment --  CHL IP PHARYNGEAL PHASE 03/12/2018 Pharyngeal Phase Impaired Pharyngeal- Pudding Teaspoon -- Pharyngeal -- Pharyngeal- Pudding Cup -- Pharyngeal -- Pharyngeal- Honey Teaspoon -- Pharyngeal -- Pharyngeal- Honey Cup -- Pharyngeal -- Pharyngeal- Nectar Teaspoon Delayed swallow initiation-pyriform sinuses;Reduced pharyngeal peristalsis;Reduced epiglottic inversion;Reduced anterior laryngeal mobility;Reduced airway/laryngeal closure;Penetration/Aspiration before swallow;Penetration/Apiration after swallow;Pharyngeal residue - pyriform;Trace aspiration Pharyngeal Material enters airway, passes BELOW cords and not ejected out despite cough attempt by patient Pharyngeal- Nectar Cup Delayed swallow initiation-pyriform sinuses;Reduced pharyngeal peristalsis;Reduced epiglottic inversion;Reduced anterior laryngeal mobility;Reduced airway/laryngeal  closure;Penetration/Aspiration before swallow;Penetration/Apiration after swallow;Trace aspiration;Pharyngeal residue - pyriform;Pharyngeal residue - valleculae Pharyngeal Material enters airway, passes BELOW cords and not ejected out despite cough attempt by patient Pharyngeal- Nectar Straw -- Pharyngeal -- Pharyngeal- Thin Teaspoon Delayed swallow initiation-pyriform sinuses;Reduced pharyngeal peristalsis;Reduced epiglottic inversion;Reduced anterior laryngeal mobility;Reduced airway/laryngeal closure;Penetration/Aspiration before swallow;Penetration/Apiration after swallow;Trace aspiration Pharyngeal Material enters airway, passes BELOW cords and not ejected out despite cough attempt by patient Pharyngeal- Thin Cup Delayed swallow initiation-pyriform sinuses;Reduced pharyngeal peristalsis;Reduced epiglottic inversion;Reduced anterior laryngeal mobility;Reduced airway/laryngeal closure;Penetration/Aspiration before swallow;Penetration/Apiration after swallow;Trace aspiration Pharyngeal Material enters airway, passes BELOW cords and not ejected out despite cough attempt by patient Pharyngeal- Thin Straw -- Pharyngeal -- Pharyngeal- Puree Delayed swallow initiation-pyriform sinuses;Reduced pharyngeal peristalsis;Reduced epiglottic inversion;Reduced anterior laryngeal mobility;Reduced airway/laryngeal closure;Penetration/Apiration after swallow;Penetration/Aspiration before swallow;Pharyngeal residue - pyriform;Pharyngeal residue - valleculae Pharyngeal Material enters airway, passes BELOW cords and not ejected out despite cough attempt by patient Pharyngeal- Mechanical Soft -- Pharyngeal -- Pharyngeal- Regular -- Pharyngeal -- Pharyngeal- Multi-consistency -- Pharyngeal -- Pharyngeal- Pill -- Pharyngeal -- Pharyngeal Comment --  CHL IP CERVICAL ESOPHAGEAL PHASE 03/12/2018 Cervical Esophageal Phase (No Data) Pudding Teaspoon -- Pudding Cup -- Honey Teaspoon -- Honey Cup -- Nectar Teaspoon -- Nectar Cup -- Nectar  Straw -- Thin Teaspoon -- Thin Cup -- Thin Straw -- Puree -- Mechanical Soft -- Regular -- Multi-consistency -- Pill -- Cervical Esophageal Comment -- [name] 03/12/2018, 2:39 PM              US Abdomen Limited Ruq  Result Date: 03/11/2018 CLINICAL DATA:  Elevated LFT EXAM: ULTRASOUND ABDOMEN LIMITED RIGHT UPPER QUADRANT COMPARISON:  PET-CT 01/06/2017 FINDINGS: Gallbladder: No gallstones or wall thickening visualized. No sonographic Murphy sign noted by sonographer. Common bile duct: Diameter: Dilated up to 13.2 mm. Liver: No focal lesion identified. Within normal limits in parenchymal echogenicity. Portal vein is patent on color Doppler imaging with normal direction of blood flow towards the liver. Trace free fluid adjacent to the liver. Incidental note made of small right pleural effusion IMPRESSION: 1. Negative for gallstones. 2. Dilated common bile duct up to 13.2 mm. Further evaluation with MRCP could be obtained as indicated. 3. Trace free fluid in the right upper quadrant and small right pleural effusion. Electronically Signed   By: Donavan Foil M.D.   On: 03/11/2018 18:25   Review of Systems  HENT: Positive for trouble swallowing and hoarseness  Respiratory: Positive for cough and shortness of breath.   Cardiovascular: Negative for chest pain.  All other systems reviewed and are negative.  Blood pressure (!) 105/58, pulse 77, temperature 98.6 F (37 C), temperature source Oral, resp. rate 13, height 5\' 6"  (1.676 m), weight 52.1 kg, SpO2 93 %. Physical Exam Vitals signs and nursing note reviewed.  Constitutional:  Cachectic male in NAD. Head: Normocephalic and atraumatic.  Eyes: His pupils are equal, round, reactive to light. Extraocular motion is intact.  Ears: Examination of the ears shows normal auricles and external auditory canals bilaterally.  Nose: Nasal examination shows normal mucosa, septum, turbinates.  Face: Facial examination shows no asymmetry. Palpation of  the face elicit no significant tenderness.  Mouth: Oral cavity examination shows no mucosal lacerations.  Neck Palpation of the neck reveals no lymphadenopathy or mass. The trachea is midline. The thyroid is not significantly enlarged.  Cardiovascular:  Rate and Rhythm: Normal rate and regular rhythm.  Pulmonary: Effort: Pulmonary effort is normal. No respiratory distress.  Skin: Skin is warm and dry.  Neurological: He is alert and oriented to person, place, and time.   Assessment/Plan: Dysphagia, likely secondary to post-radiation changes.  His neck CT scan 2 days ago showed no discrete mass. - No ENT intervention recommended at this time.  - Dysphagia II diet as per SLP recommendations. - Will need thoracic evaluation of his pulmonary nodules.  Nobel Brar W Navdeep Halt 03/12/2018, 4:28 PM

## 2018-03-12 NOTE — Evaluation (Signed)
Physical Therapy Evaluation Patient Details Name: Tony Berry MRN: [id number] DOB: [birth year] Today's Date: 03/12/2018   History of Present Illness  Tony Berry is a 57 yo male with PMH esophageal cancer s/p radiation ending 7 months ago, malnutrition, chronic back pain on methadone.  Admitted with SOB and weakness due to aspiration PNA, COPD and lung mass.   Clinical Impression  Patient presents with decreased independence with mobility due to generalized weakness with acute illness and bedrest.  Feel he will benefit from skilled PT in the acute setting to allow return home with family support and follow up HHPT for safety and HEP for strength/balance.      Follow Up Recommendations Supervision for mobility/OOB;Home health PT    Equipment Recommendations  None recommended by PT    Recommendations for Other Services       Precautions / Restrictions Precautions Precautions: Fall Precaution Comments: wife reports pt falling due to weakness, gets up on his own       Mobility  Bed Mobility Overal bed mobility: Modified Independent                Transfers Overall transfer level: Needs assistance Equipment used: Rolling walker (2 wheeled) Transfers: Sit to/from Stand Sit to Stand: Min guard;Supervision         General transfer comment: for safety with lines  Ambulation/Gait Ambulation/Gait assistance: Min guard;Supervision Gait Distance (Feet): 200 Feet Assistive device: Rolling walker (2 wheeled) Gait Pattern/deviations: Step-to pattern;Step-through pattern     General Gait Details: stable with walker, on RA most of ambulation SpO2 86% placed on 2L O2 SpO2 88-89% on 3L in room SpO2 99%  Stairs            Wheelchair Mobility    Modified Rankin (Stroke Patients Only)       Balance Overall balance assessment: Needs assistance;History of Falls   Sitting balance-Leahy Scale: Good       Standing balance-Leahy Scale: Fair                               Pertinent Vitals/Pain Pain Assessment: No/denies pain    Home Living Family/patient expects to be discharged to:: Private residence Living Arrangements: Spouse/significant other Available Help at Discharge: Family Type of Home: Mobile home Home Access: Stairs to enter Entrance Stairs-Rails: Can reach both Entrance Stairs-Number of Steps: 3-4 Home Layout: One level Home Equipment: Walker - 2 wheels;Cane - single point      Prior Function Level of Independence: Independent with assistive device(s)         Comments: utilizes cane some     Hand Dominance        Extremity/Trunk Assessment   Upper Extremity Assessment Upper Extremity Assessment: Overall WFL for tasks assessed    Lower Extremity Assessment Lower Extremity Assessment: RLE deficits/detail RLE Deficits / Details: limited ankle mobility due to h/o fusion, knee with limited extension with h/o fracture/ORIF       Communication   Communication: Expressive difficulties(esophogeal Ca current XRT)  Cognition Arousal/Alertness: Awake/alert Behavior During Therapy: Flat affect Overall Cognitive Status: Difficult to assess                                 General Comments: seems [facility]      General Comments General comments (skin integrity, edema, etc.): wife in the room and supportive, but with back issues herself  so cannot assist    Exercises     Assessment/Plan    PT Assessment Patient needs continued PT services  PT Problem List Decreased mobility;Decreased balance;Decreased strength;Decreased knowledge of use of DME;Decreased safety awareness       PT Treatment Interventions DME instruction;Gait training;Stair training;Therapeutic exercise;Therapeutic activities;Functional mobility training;Balance training;Patient/family education    PT Goals (Current goals can be found in the Care Plan section)  Acute Rehab PT Goals Patient Stated Goal: to return home PT Goal  Formulation: With patient/family Time For Goal Achievement: 03/19/18 Potential to Achieve Goals: Good    Frequency Min 3X/week   Barriers to discharge        Co-evaluation               AM-PAC PT "6 Clicks" Mobility  Outcome Measure Help needed turning from your back to your side while in a flat bed without using bedrails?: None Help needed moving from lying on your back to sitting on the side of a flat bed without using bedrails?: None Help needed moving to and from a bed to a chair (including a wheelchair)?: A Little Help needed standing up from a chair using your arms (e.g., wheelchair or bedside chair)?: A Little Help needed to walk in hospital room?: A Little Help needed climbing 3-5 steps with a railing? : A Little 6 Click Score: 20    End of Session Equipment Utilized During Treatment: Gait belt Activity Tolerance: Patient tolerated treatment well Patient left: with call bell/phone within reach;in bed;with family/visitor present(seated EOB With meal)   PT Visit Diagnosis: Other abnormalities of gait and mobility (R26.89);History of falling (Z91.81)    Time: 2505-3976 PT Time Calculation (min) (ACUTE ONLY): 27 min   Charges:   PT Evaluation $PT Eval Moderate Complexity: 1 Mod PT Treatments $Gait Training: 8-22 mins        Magda Kiel, [place] Acute Rehabilitation Services [phone] 03/12/2018   Reginia Naas 03/12/2018, 5:13 PM

## 2018-03-12 NOTE — Progress Notes (Signed)
Modified Barium Swallow Progress Note  Patient Details  Name: Tony Berry MRN: [id number] Date of Birth: 1960/12/07  Today's Date: 03/12/2018  Modified Barium Swallow completed.  Full report located under Chart Review in the Imaging Section.  Brief recommendations include the following:  Clinical Impression  Pt presents with a chronic pharyngeal dysphagia that appears to be deteriorating per pt's account.  There are areas of thickening/abnormal soft tissue of pharyngeal wall, epiglottis, and periarytenoids - this can be visualized when coated with barium.  Pt demonstrates reduced velopharyngeal approximation. He is unable to achieve laryngeal vestibular closure, which leads to consistent penetration of all boluses into the larynx.  A portion of this material passes through vocal folds and into trachea.  Aspiration elicits a cough.  He is able to eject a portion back into pharynx which passes through UES.  There is aspiration of all consistences - liquid and solid - either before or after the swallow event.  He attempted multiple postural adjustments and strategies to improve airway protection (head tilts and turns left and right, chin tuck) -  these were not effective. He expectorates residuals from pharynx.  Pt's voice is generally aphonic with some ability to achieve voice with effort.      We discussed the MBS and reviewed the video in real time and afterward in his room.  Pt wants to continue eating; he verbalizes understanding that a percentage of all that he eats/drinks is aspirated; he is willing to consider a PEG again for improved nutrition.  Discussed results with Dr. Rebeca Alert. Deterioration in swallow function can be attributable to late effects of radiation/fibrosis, but pt needs to be seen either by his primary ENT or an ENT here for further w/u, repeat laryngoscopy given anatomy and voice changes.   Continue soft diet, nectar-thick liquids for now (nectars tolerated marginally better  than thin liquids).  SLP will follow for plan, further education with pt.     Swallow Evaluation Recommendations   Recommended Consults: Consider ENT evaluation   SLP Diet Recommendations: Dysphagia 2 (Fine chop) solids;Nectar thick liquid   Liquid Administration via: Cup   Medication Administration: Crushed with puree   Supervision: Patient able to self feed   Compensations: Small sips/bites;Clear throat intermittently   Postural Changes: Remain semi-upright after after feeds/meals (Comment)   Oral Care Recommendations: Oral care BID   Other Recommendations: Order thickener from [place]. Tony Berry, [place] Office number [phone] Pager [phone]  Tony Berry 03/12/2018,2:33 PM

## 2018-03-13 ENCOUNTER — Encounter (HOSPITAL_COMMUNITY): Payer: Self-pay

## 2018-03-13 DIAGNOSIS — Z66 Do not resuscitate: Secondary | ICD-10-CM

## 2018-03-13 DIAGNOSIS — J449 Chronic obstructive pulmonary disease, unspecified: Secondary | ICD-10-CM

## 2018-03-13 DIAGNOSIS — R918 Other nonspecific abnormal finding of lung field: Secondary | ICD-10-CM

## 2018-03-13 DIAGNOSIS — R740 Nonspecific elevation of levels of transaminase and lactic acid dehydrogenase [LDH]: Secondary | ICD-10-CM

## 2018-03-13 DIAGNOSIS — K5900 Constipation, unspecified: Secondary | ICD-10-CM

## 2018-03-13 DIAGNOSIS — Z923 Personal history of irradiation: Secondary | ICD-10-CM

## 2018-03-13 DIAGNOSIS — R1310 Dysphagia, unspecified: Secondary | ICD-10-CM

## 2018-03-13 DIAGNOSIS — Z7902 Long term (current) use of antithrombotics/antiplatelets: Secondary | ICD-10-CM

## 2018-03-13 DIAGNOSIS — Z8501 Personal history of malignant neoplasm of esophagus: Secondary | ICD-10-CM

## 2018-03-13 DIAGNOSIS — E876 Hypokalemia: Secondary | ICD-10-CM

## 2018-03-13 DIAGNOSIS — I311 Chronic constrictive pericarditis: Secondary | ICD-10-CM

## 2018-03-13 DIAGNOSIS — E43 Unspecified severe protein-calorie malnutrition: Secondary | ICD-10-CM

## 2018-03-13 DIAGNOSIS — E871 Hypo-osmolality and hyponatremia: Secondary | ICD-10-CM

## 2018-03-13 DIAGNOSIS — Z7982 Long term (current) use of aspirin: Secondary | ICD-10-CM

## 2018-03-13 DIAGNOSIS — M549 Dorsalgia, unspecified: Secondary | ICD-10-CM

## 2018-03-13 DIAGNOSIS — J189 Pneumonia, unspecified organism: Secondary | ICD-10-CM

## 2018-03-13 DIAGNOSIS — G8929 Other chronic pain: Secondary | ICD-10-CM

## 2018-03-13 DIAGNOSIS — Z79891 Long term (current) use of opiate analgesic: Secondary | ICD-10-CM

## 2018-03-13 LAB — CBC
HCT: 31.5 % — ABNORMAL LOW (ref 39.0–52.0)
Hemoglobin: 9.8 g/dL — ABNORMAL LOW (ref 13.0–17.0)
MCH: 29.5 pg (ref 26.0–34.0)
MCHC: 31.1 g/dL (ref 30.0–36.0)
MCV: 94.9 fL (ref 80.0–100.0)
Platelets: 200 10*3/uL (ref 150–400)
RBC: 3.32 MIL/uL — ABNORMAL LOW (ref 4.22–5.81)
RDW: 12.4 % (ref 11.5–15.5)
WBC: 8.2 10*3/uL (ref 4.0–10.5)
nRBC: 0 % (ref 0.0–0.2)

## 2018-03-13 LAB — COMPREHENSIVE METABOLIC PANEL
ALT: 215 U/L — ABNORMAL HIGH (ref 0–44)
AST: 181 U/L — ABNORMAL HIGH (ref 15–41)
Albumin: 1.9 g/dL — ABNORMAL LOW (ref 3.5–5.0)
Alkaline Phosphatase: 42 U/L (ref 38–126)
Anion gap: 8 (ref 5–15)
BUN: 8 mg/dL (ref 6–20)
CO2: 35 mmol/L — ABNORMAL HIGH (ref 22–32)
Calcium: 7.9 mg/dL — ABNORMAL LOW (ref 8.9–10.3)
Chloride: 91 mmol/L — ABNORMAL LOW (ref 98–111)
Creatinine, Ser: 0.42 mg/dL — ABNORMAL LOW (ref 0.61–1.24)
GFR calc Af Amer: >60 mL/min (ref >60)
GFR calc non Af Amer: >60 mL/min (ref >60)
Glucose, Bld: 72 mg/dL (ref 70–99)
Potassium: 4.1 mmol/L (ref 3.5–5.1)
Sodium: 134 mmol/L — ABNORMAL LOW (ref 135–145)
Total Bilirubin: 0.4 mg/dL (ref 0.3–1.2)
Total Protein: 5.7 g/dL — ABNORMAL LOW (ref 6.5–8.1)

## 2018-03-13 LAB — PHOSPHORUS: Phosphorus: 3.6 mg/dL (ref 2.5–4.6)

## 2018-03-13 LAB — MAGNESIUM: Magnesium: 1.8 mg/dL (ref 1.7–2.4)

## 2018-03-13 NOTE — Progress Notes (Addendum)
   Subjective:   Tony Berry was examined this am and reports that he is doing "ok." We explained to him regarding the risk of ongoing aspiration risk with his dysphagia. He re-iterated that he previously had peg tube placement and was uncomfortable. He is willing to talk to the palliative care team.   Objective:  Vital signs in last 24 hours: Vitals:   03/12/18 2026 03/13/18 0010 03/13/18 0449 03/13/18 0916  BP:  114/62 (!) 111/56 118/64  Pulse: 71 80 77 65  Resp:  16 19 14   Temp: 98.2 F (36.8 C) 98.1 F (36.7 C) 98.1 F (36.7 C) 98.5 F (36.9 C)  TempSrc: Oral Oral Oral Tympanic  SpO2:  96% 93% 96%  Weight:      Height:       Cardiac: normal rate and rhythm, clear s1 and s2, no murmurs, rubs or gallops Pulmonary: decreased breath sounds throughout Abdominal: non distended abdomen, soft and nontender Extremities: no LE edema Psych: Alert, conversant    Assessment/Plan:  Principal Problem:   Multifocal pneumonia Active Problems:   Transaminitis   Troponin level elevated   Protein-calorie malnutrition, severe   Hyponatremia   Hypophosphatemia  57 yo male with PMH esophageal cancer s/p radiation ending 7 months ago, malnutrition, chronic back pain on methadone with recent admission to [place] [facility] one month ago for pneumonia who presented with SOB and weakness that had been worsening since previous discharge.  Multifocal Aspiration Pneumonia COPD Lung Mass Continuing to improve. Weaned to [facility] at 93% on rounds. Leukocytosis resolved. Discussed with patient that soft bp is normal for him around upper 08M systolic. Was supposed to follow-up with Dr. Alcide Clever in [place] with pulmonology after hospitalization at [facility] for lung nodules but was unable to have biopsy and treatment covered by insurance per wife.   - Wean O2 for sats >90% - d/c metronidazole - cont. Cefepime 7 days total  - holding IVF as softer bp is normotensive for him - duo-nebs q6h prn - am  CBC  Constriction Pericarditis Possible secondary to previous radiation. Cardiology consulted, recommending plavix and asa. He is unable to tolerate RHC at this time, but may be done if his overall condition improves.   - cardiology consulted, appreciate recommendations  - cont. Asa 81 - continue plavix 75mg  qd   Transaminitis In the setting of hypotension 2/2 severe infection, improving with abx and IVF  - am CMP  Hyponatremia Hypokalemia Continuing to replete as necessary. Possibly secondary to refeeding syndrome as he is quite cachectic and previously had trouble swallowing food and drink at home.   - am Mg, Phos, CMP   History of Esophageal Cancer/Mouth Sores Malnutrition, dysphagia Tolerating dysphagia 2 diet well. Mouth lesions in buccal area appear opaque, non bloody and to be healing. Very high aspiration risk  - dysphagia 2 diet - cont. Magic mouthwash - ENT says nothing to do from their standpoint - palliative care consulted today to discuss options  OUD Has not yet had a BM since admission   - cont. 115 mg methadone liquid - senokot scheduled to bid, continue miralax  VTE: lovenox IVF: none Diet: dysphagia 2 Code: DNR  Dispo: Anticipated discharge in approximately 1-2 days.   Katherine Roan, MD 03/13/2018, 11:26 AM

## 2018-03-13 NOTE — Evaluation (Signed)
Occupational Therapy Evaluation Patient Details Name: Tony Berry MRN: [id number] DOB: [birth year] Today's Date: 03/13/2018    History of Present Illness Mr.Mr. Bonnette is a 57 yo male with PMH esophageal cancer s/p radiation ending 7 months ago, malnutrition, chronic back pain on methadone.  Admitted with SOB and weakness due to aspiration PNA, COPD and lung mass.    Clinical Impression   Pt is at baseline level of function with ADLs and is sup - min guard A for functional mobility due to decreased activity tolerance and generalized weakness. Pt reports that he uses a cane at home and that his wife can assist him as needed. Pt reports that his falls are due to weakness. No further acute OT is indicated at this time, pt to continue with acute PT services to address functional mobility safety    Follow Up Recommendations  No OT follow up    Equipment Recommendations  None recommended by OT    Recommendations for Other Services       Precautions / Restrictions Precautions Precautions: Fall Precaution Comments: wife reports pt falling due to weakness, gets up on his own  Restrictions Weight Bearing Restrictions: No      Mobility Bed Mobility Overal bed mobility: Modified Independent                Transfers Overall transfer level: Needs assistance Equipment used: Rolling walker (2 wheeled) Transfers: Sit to/from Stand Sit to Stand: Min guard;Supervision         General transfer comment: for safety with lines    Balance Overall balance assessment: Needs assistance;History of Falls   Sitting balance-Leahy Scale: Good     Standing balance support: During functional activity Standing balance-Leahy Scale: Fair                             ADL either performed or assessed with clinical judgement   ADL Overall ADL's : Needs assistance/impaired Eating/Feeding: Independent;Sitting   Grooming: Wash/dry hands;Wash/dry face;Standing;Supervision/safety    Upper Body Bathing: Set up;Sitting   Lower Body Bathing: Min guard;Supervison/ safety;Sit to/from stand   Upper Body Dressing : Set up;Sitting   Lower Body Dressing: Min guard;Supervision/safety;Sit to/from stand   Toilet Transfer: Min guard;Supervision/safety;Ambulation;RW;Comfort height toilet;Grab bars;Regular Museum/gallery exhibitions officer and Hygiene: Supervision/safety;Sit to/from stand   Tub/ Shower Transfer: Min guard;Supervision/safety;Ambulation;Rolling walker;Grab bars;3 in 1   Functional mobility during ADLs: Min guard;Supervision/safety General ADL Comments: pt reports that he falls due to weakness, low endurance     Vision Patient Visual Report: No change from baseline       Perception     Praxis      Pertinent Vitals/Pain Pain Assessment: 0-10 Pain Score: 4  Pain Location: LEs, back Pain Descriptors / Indicators: Aching;Sore Pain Intervention(s): Monitored during session;Repositioned     Hand Dominance Right   Extremity/Trunk Assessment Upper Extremity Assessment Upper Extremity Assessment: Generalized weakness   Lower Extremity Assessment Lower Extremity Assessment: Defer to PT evaluation   Cervical / Trunk Assessment Cervical / Trunk Assessment: Normal   Communication Communication Communication: Expressive difficulties   Cognition Arousal/Alertness: Awake/alert Behavior During Therapy: Flat affect Overall Cognitive Status: Within Functional Limits for tasks assessed                                     General Comments  Exercises     Shoulder Instructions      Home Living Family/patient expects to be discharged to:: Private residence Living Arrangements: Spouse/significant other Available Help at Discharge: Family Type of Home: Mobile home Home Access: Stairs to enter Entrance Stairs-Number of Steps: 3-4 Entrance Stairs-Rails: Can reach both Home Layout: One level     Bathroom Shower/Tub:  Teacher, early years/pre: Standard     Home Equipment: Environmental consultant - 2 wheels;Cane - single point          Prior Functioning/Environment Level of Independence: Independent with assistive device(s)        Comments: utilizes cane some        OT Problem List: Decreased strength;Decreased activity tolerance      OT Treatment/Interventions:      OT Goals(Current goals can be found in the care plan section) Acute Rehab OT Goals Patient Stated Goal: to return home OT Goal Formulation: With patient  OT Frequency:     Barriers to D/C:            Co-evaluation              AM-PAC OT "6 Clicks" Daily Activity     Outcome Measure Help from another person eating meals?: None Help from another person taking care of personal grooming?: None Help from another person toileting, which includes using toliet, bedpan, or urinal?: None Help from another person bathing (including washing, rinsing, drying)?: None Help from another person to put on and taking off regular upper body clothing?: None Help from another person to put on and taking off regular lower body clothing?: None 6 Click Score: 24   End of Session Equipment Utilized During Treatment: Rolling walker;Gait belt  Activity Tolerance: Patient limited by fatigue Patient left: in bed;with call bell/phone within reach;with family/visitor present  OT Visit Diagnosis: Muscle weakness (generalized) (M62.81)                Time: 0165-5374 OT Time Calculation (min): 24 min Charges:  OT General Charges $OT Visit: 1 Visit OT Evaluation $OT Eval Moderate Complexity: 1 Mod OT Treatments $Therapeutic Activity: 8-22 mins    Britt Bottom 03/13/2018, 9:17 AM

## 2018-03-14 ENCOUNTER — Inpatient Hospital Stay (HOSPITAL_COMMUNITY): Payer: Medicaid Other

## 2018-03-14 DIAGNOSIS — I5189 Other ill-defined heart diseases: Secondary | ICD-10-CM

## 2018-03-14 LAB — COMPREHENSIVE METABOLIC PANEL
ALT: 156 U/L — ABNORMAL HIGH (ref 0–44)
AST: 85 U/L — ABNORMAL HIGH (ref 15–41)
Albumin: 1.9 g/dL — ABNORMAL LOW (ref 3.5–5.0)
Alkaline Phosphatase: 40 U/L (ref 38–126)
Anion gap: 9 (ref 5–15)
BUN: 7 mg/dL (ref 6–20)
CO2: 36 mmol/L — ABNORMAL HIGH (ref 22–32)
Calcium: 8.1 mg/dL — ABNORMAL LOW (ref 8.9–10.3)
Chloride: 90 mmol/L — ABNORMAL LOW (ref 98–111)
Creatinine, Ser: 0.5 mg/dL — ABNORMAL LOW (ref 0.61–1.24)
GFR calc Af Amer: >60 mL/min (ref >60)
GFR calc non Af Amer: >60 mL/min (ref >60)
Glucose, Bld: 68 mg/dL — ABNORMAL LOW (ref 70–99)
Potassium: 4.1 mmol/L (ref 3.5–5.1)
Sodium: 135 mmol/L (ref 135–145)
Total Bilirubin: 0.6 mg/dL (ref 0.3–1.2)
Total Protein: 5.9 g/dL — ABNORMAL LOW (ref 6.5–8.1)

## 2018-03-14 LAB — PHOSPHORUS: Phosphorus: 3.4 mg/dL (ref 2.5–4.6)

## 2018-03-14 LAB — HSV DNA BY PCR (REFERENCE LAB)
HSV 1 DNA: NEGATIVE
HSV 2 DNA: NEGATIVE

## 2018-03-14 LAB — MAGNESIUM: Magnesium: 1.6 mg/dL — ABNORMAL LOW (ref 1.7–2.4)

## 2018-03-14 MED ORDER — MAGNESIUM SULFATE 2 GM/50ML IV SOLN
2.0000 g | Freq: Once | INTRAVENOUS | Status: AC
  Administered 2018-03-14: 2 g via INTRAVENOUS
  Filled 2018-03-14: qty 50

## 2018-03-14 NOTE — Progress Notes (Signed)
   Subjective:  States he is feeling well today. He denies SOB, chest pain. He had a BM yesterday. He states he feels like he is eating ok. We further discussed whether he would want a PEG tube due to his increased risk of aspiration, and he stated that he would want to do this.   Objective:  Vital signs in last 24 hours: Vitals:   03/13/18 1600 03/13/18 2006 03/14/18 0002 03/14/18 0356  BP:  122/62 125/87 127/65  Pulse: 73 66 70 65  Resp: 20 15 16 17   Temp:  98.5 F (36.9 C) 98.3 F (36.8 C) 97.9 F (36.6 C)  TempSrc:  Oral Oral Oral  SpO2: (!) 89% 93% 97% 96%  Weight:      Height:       Constitution: supine in bed, NAD Cardio: RRR, no m/r/g Respiratory: CTAB, no w/r/r Neuro: a&o, pleasant Skin: c/d/i    Assessment/Plan:  Principal Problem:   Multifocal pneumonia Active Problems:   Transaminitis   Troponin level elevated   Protein-calorie malnutrition, severe   Hyponatremia   Hypophosphatemia  57 yo male Tony Berry esophageal cancer s/p radiationending 7 months ago, malnutrition, chronic back pain on methadone with recent admission to [facility] one month ago for pneumonia who presented with aspiration pneumonia.   Multifocal Aspiration Pneumonia COPD Lung Mass Symptoms continuing to improve. Continues to have slight cough. His voice has returned somewhat.   - Wean O2 for sats >90% - ambulate with O2 sats  - cont. Cefepime 7 days total  - duo-nebs q6h prn - care management consult for PCP  - palliative consult pending   Constriction Pericarditis Does not currently qualify for cath or surgery due to medical condition.    - cont. Asa 81 - continue plavix 75mg  qd   Transaminitis In the setting of hypotension 2/2 severe infection, improving with abx and IVF  - am CMP  Hypomagnesemia Continuing to replete as necessary.   - am Mg, Phos, CMP  History of Esophageal Cancer/Mouth Sores Malnutrition, dysphagia Tolerating dysphagia 2 diet well.  High aspiration risk. Discussed with him the option of getting a PEG, risks and benefits. He states that he would like to go forward with the procedure.   - IR consulted for PEG tube placement - cont dysphagia 2 diet - cont. Magic mouthwash - ENT says nothing to do from their standpoint  OUD BM yesterday  - cont. 115 mg methadone liquid - senokot scheduledto bid, continue miralax  VTE: lovenox IVF: none Diet: dysphagia 2 Code: DNR  Dispo: Anticipated discharge in approximately 2-3 day(s).   Molli Hazard A, DO 03/14/2018, 7:07 AM Pager: [phone]

## 2018-03-14 NOTE — Progress Notes (Signed)
SATURATION QUALIFICATIONS: (This note is used to comply with regulatory documentation for home oxygen)  Patient Saturations on Room Air at Rest = 82%  Patient Saturations on Room Air while Ambulating = 78%  Patient Saturations on 3 Liters of oxygen while Ambulating = 90-93%  Please briefly explain why patient needs home oxygen: Desaturation while ambulating. Needs 3L to achieve 90-93 % O2 sats while ambulating.

## 2018-03-15 ENCOUNTER — Encounter (HOSPITAL_COMMUNITY): Payer: Self-pay

## 2018-03-15 DIAGNOSIS — Z9981 Dependence on supplemental oxygen: Secondary | ICD-10-CM

## 2018-03-15 DIAGNOSIS — Z7189 Other specified counseling: Secondary | ICD-10-CM

## 2018-03-15 DIAGNOSIS — I21A1 Myocardial infarction type 2: Secondary | ICD-10-CM

## 2018-03-15 DIAGNOSIS — Z681 Body mass index (BMI) 19 or less, adult: Secondary | ICD-10-CM

## 2018-03-15 DIAGNOSIS — Z515 Encounter for palliative care: Secondary | ICD-10-CM

## 2018-03-15 DIAGNOSIS — J9620 Acute and chronic respiratory failure, unspecified whether with hypoxia or hypercapnia: Secondary | ICD-10-CM | POA: Diagnosis present

## 2018-03-15 DIAGNOSIS — K224 Dyskinesia of esophagus: Secondary | ICD-10-CM

## 2018-03-15 LAB — CULTURE, BLOOD (ROUTINE X 2)
Culture: NO GROWTH
Culture: NO GROWTH
Special Requests: ADEQUATE
Special Requests: ADEQUATE

## 2018-03-15 LAB — CBC
HCT: 30.8 % — ABNORMAL LOW (ref 39.0–52.0)
Hemoglobin: 9.5 g/dL — ABNORMAL LOW (ref 13.0–17.0)
MCH: 29.4 pg (ref 26.0–34.0)
MCHC: 30.8 g/dL (ref 30.0–36.0)
MCV: 95.4 fL (ref 80.0–100.0)
Platelets: 197 10*3/uL (ref 150–400)
RBC: 3.23 MIL/uL — ABNORMAL LOW (ref 4.22–5.81)
RDW: 12.4 % (ref 11.5–15.5)
WBC: 5.2 10*3/uL (ref 4.0–10.5)
nRBC: 0 % (ref 0.0–0.2)

## 2018-03-15 LAB — MAGNESIUM: Magnesium: 1.6 mg/dL — ABNORMAL LOW (ref 1.7–2.4)

## 2018-03-15 LAB — COMPREHENSIVE METABOLIC PANEL
ALT: 111 U/L — ABNORMAL HIGH (ref 0–44)
AST: 41 U/L (ref 15–41)
Albumin: 2 g/dL — ABNORMAL LOW (ref 3.5–5.0)
Alkaline Phosphatase: 37 U/L — ABNORMAL LOW (ref 38–126)
Anion gap: 7 (ref 5–15)
BUN: <5 mg/dL — ABNORMAL LOW (ref 6–20)
CO2: 38 mmol/L — ABNORMAL HIGH (ref 22–32)
Calcium: 8 mg/dL — ABNORMAL LOW (ref 8.9–10.3)
Chloride: 89 mmol/L — ABNORMAL LOW (ref 98–111)
Creatinine, Ser: 0.44 mg/dL — ABNORMAL LOW (ref 0.61–1.24)
GFR calc Af Amer: >60 mL/min (ref >60)
GFR calc non Af Amer: >60 mL/min (ref >60)
Glucose, Bld: 79 mg/dL (ref 70–99)
Potassium: 4.1 mmol/L (ref 3.5–5.1)
Sodium: 134 mmol/L — ABNORMAL LOW (ref 135–145)
Total Bilirubin: 0.3 mg/dL (ref 0.3–1.2)
Total Protein: 5.6 g/dL — ABNORMAL LOW (ref 6.5–8.1)

## 2018-03-15 LAB — PHOSPHORUS: Phosphorus: 3.7 mg/dL (ref 2.5–4.6)

## 2018-03-15 MED ORDER — SENNOSIDES-DOCUSATE SODIUM 8.6-50 MG PO TABS: 1.0000 | ORAL_TABLET | Freq: Every evening | ORAL | 0 refills | Status: DC | PRN

## 2018-03-15 MED ORDER — ENSURE ENLIVE PO LIQD: 237.0000 mL | Freq: Three times a day (TID) | ORAL | 0 refills | Status: AC

## 2018-03-15 MED ORDER — POLYETHYLENE GLYCOL 3350 17 G PO PACK: 17.0000 g | PACK | Freq: Every day | ORAL | 0 refills | Status: DC

## 2018-03-15 MED ORDER — RESOURCE THICKENUP CLEAR PO POWD: 1.0000 | ORAL | 0 refills | Status: DC | PRN

## 2018-03-15 MED ORDER — NICOTINE 21 MG/24HR TD PT24: 21.0000 mg | MEDICATED_PATCH | Freq: Every day | TRANSDERMAL | 0 refills | Status: DC

## 2018-03-15 MED ORDER — ASPIRIN 81 MG PO CHEW: 81.0000 mg | CHEWABLE_TABLET | Freq: Every day | ORAL | 0 refills | Status: AC

## 2018-03-15 MED ORDER — LEVOFLOXACIN IN D5W 750 MG/150ML IV SOLN: 750.0000 mg | INTRAVENOUS | 0 refills | Status: DC

## 2018-03-15 MED ORDER — CLOPIDOGREL BISULFATE 75 MG PO TABS: 75.0000 mg | ORAL_TABLET | Freq: Every day | ORAL | 0 refills | Status: DC

## 2018-03-15 MED ORDER — LEVOFLOXACIN 25 MG/ML PO SOLN: 750.0000 mg | Freq: Every day | ORAL | 0 refills | Status: DC

## 2018-03-15 MED ORDER — LEVOFLOXACIN 750 MG PO TABS: 750.0000 mg | ORAL_TABLET | Freq: Every day | ORAL | 0 refills | Status: AC

## 2018-03-15 NOTE — Progress Notes (Addendum)
SATURATION QUALIFICATIONS: (This note is used to comply with regulatory documentation for home oxygen)  Patient Saturations on Room Air at Rest = 90%  Patient Saturations on Room Air while Ambulating = 86%  Patient Saturations on 2 Liters of oxygen while Ambulating = 92%  Please briefly explain why patient needs home oxygen: Patient desats with ambulation unless O2 supplemented

## 2018-03-15 NOTE — Progress Notes (Signed)
Physical Therapy Treatment Patient Details Name: Tony Berry MRN: [id number] DOB: 02/12/1961 Today's Date: 03/15/2018    History of Present Illness [name] is a 57 yo male with PMH esophageal cancer s/p radiation ending 7 months ago, malnutrition, chronic back pain on methadone.  Admitted with SOB and weakness due to aspiration PNA, COPD and lung mass.     PT Comments    Continuing work on functional mobility and activity tolerance;  Walked a short distance without UE support on RW, and noted decr steadiness; Much more steady with RW; Worth considering rollator RW for home to give him the opportunity to sit for rest if needed    Follow Up Recommendations  Supervision for mobility/OOB;Home health PT     Equipment Recommendations  Other (comment)(Rollator RW)    Recommendations for Other Services       Precautions / Restrictions Precautions Precautions: Fall Precaution Comments: wife reports pt falling due to weakness, gets up on his own     Mobility  Bed Mobility Overal bed mobility: Modified Independent                Transfers Overall transfer level: Needs assistance   Transfers: Sit to/from Stand Sit to Stand: Min guard         General transfer comment: Noted heavy dependence on UEs to push up to stand, and tending to reach out for UE support   Ambulation/Gait Ambulation/Gait assistance: Min guard;Supervision Gait Distance (Feet): 300 Feet Assistive device: Rolling walker (2 wheeled) Gait Pattern/deviations: Step-to pattern;Step-through pattern     General Gait Details: Stable with RW; Walked on 2-3 L supplemental O2 and sats ranged 86%-95%; frequent standing rest breaks and cues for focused breathing   Stairs             Wheelchair Mobility    Modified Rankin (Stroke Patients Only)       Balance     Sitting balance-Leahy Scale: Good       Standing balance-Leahy Scale: Fair                               Cognition Arousal/Alertness: Awake/alert Behavior During Therapy: Flat affect Overall Cognitive Status: Within Functional Limits for tasks assessed                                 General Comments: seems Tony Berry      Exercises      General Comments        Pertinent Vitals/Pain Pain Assessment: 0-10 Pain Score: 3  Pain Location: LEs, back Pain Descriptors / Indicators: Aching;Sore Pain Intervention(s): Monitored during session;Other (comment)(stretched upon initial standing)    Home Living                      Prior Function            PT Goals (current goals can now be found in the care plan section) Acute Rehab PT Goals Patient Stated Goal: to return home PT Goal Formulation: With patient/family Time For Goal Achievement: 03/19/18 Potential to Achieve Goals: Good Progress towards PT goals: Progressing toward goals    Frequency    Min 3X/week      PT Plan Current plan remains appropriate;Equipment recommendations need to be updated    Co-evaluation  AM-PAC PT "6 Clicks" Mobility   Outcome Measure  Help needed turning from your back to your side while in a flat bed without using bedrails?: None Help needed moving from lying on your back to sitting on the side of a flat bed without using bedrails?: None Help needed moving to and from a bed to a chair (including a wheelchair)?: None Help needed standing up from a chair using your arms (e.g., wheelchair or bedside chair)?: A Little Help needed to walk in Berry room?: A Little Help needed climbing 3-5 steps with a railing? : A Little 6 Click Score: 21    End of Session Equipment Utilized During Treatment: Oxygen Activity Tolerance: Patient tolerated treatment well Patient left: in bed;with call bell/phone within reach(sitting EOB)   PT Visit Diagnosis: Other abnormalities of gait and mobility (R26.89);History of falling (Z91.81)     Time: 8757-9728 PT Time  Calculation (min) (ACUTE ONLY): 27 min  Charges:  $Gait Training: 23-37 mins                     Roney Marion, [place]  Acute Rehabilitation Services Pager [phone] Office [place] 03/15/2018, 11:22 AM

## 2018-03-15 NOTE — Progress Notes (Signed)
   Subjective:  [name] was examined at bedside and reports that he is doing "Ok." He is well aware of the PEG tube placement today.  Patient reports that he is doing okay, still having some shortness of breath.  He denies any other symptoms at the moment.   Objective:  Vital signs in last 24 hours: Vitals:   03/14/18 1700 03/14/18 2009 03/15/18 0011 03/15/18 0435  BP:  128/66 (!) 145/64 131/69  Pulse: 71 (!) 56 66 66  Resp: 11 14 12 10   Temp:  98 F (36.7 C) 98.3 F (36.8 C) 98.1 F (36.7 C)  TempSrc:  Oral Oral Oral  SpO2: 96% 99% 97% 99%  Weight:      Height:        General: Frail appearing, no acute distress Cardiac: RRR, normal S1, S2, no murmurs, rubs or gallops  Pulmonary: Lungs CTA bilaterally, no wheezing, rhonchi or rales  Abdomen: Soft, non-tender, +bowel sounds, no guarding or masses noted     Assessment/Plan:  Principal Problem:   Multifocal pneumonia Active Problems:   Transaminitis   Troponin level elevated   Protein-calorie malnutrition, severe   Hyponatremia   Hypophosphatemia   Carditis  This is a 57 year old male with a history of esophageal cancer status post radiation that finished 7 months ago, malnutrition, 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  Multi-focal aspiration pneumonia, COPD, lung mass: Patient continues to improve.  His ambulation today showed a drop down to 88% on room air with ambulation.  He reports that he is symptomatically feeling better.  He is not on any oxygen at home and will likely need to be discharged with oxygen given his ambulation saturations.  Patient is on day 5 of 7 of cefepime. -Continue cefepime, today is day 5 of 7.  -Discharge with 2 more days of levequin -Continue duo nebs every 6 hours as needed -Care management to help find PCP -Palliative care consult  History of esophageal cancer, malnutrition, dysphasia:  He is currently on the dysphagia 2 diet, he is tolerating it well.  Yesterday the team discussed the option of getting a PEG tube given his significant malnutrition, and he reported that he would like to get it.  After discussion with IR they reported that his Plavix would need to be held for 5 days.  We will talk to IR to see if he can get a PEG tube procedure in the outpatient setting. -IR scheduling will schedule PEG tube placement with patient once he is discharged.  -Continue dysphagia diet  OUD: Patient is on 115 mg methadone's liquid, Senokot scheduled, and MiraLAX.  FEN: No fluids, replete lytes prn, dysphagia diet VTE ppx: Lovenox  Code Status: DNR   Dispo: Anticipated discharge in approximately today.   Asencion Noble, MD 03/15/2018, 6:22 AM Pager: [phone]

## 2018-03-15 NOTE — Care Management Note (Signed)
Case Management Note Marvetta Gibbons RN, BSN Transitions of Care Unit 4E- RN Case Manager [phone]  Patient Details  Name: EXZAVIER RUDERMAN MRN: [id number] Date of Birth: [birth year]  Subjective/Objective:  Pt admitted with multifocal PNA, hx of esoph. CA                  Action/Plan: PTA Pt lived at home with wife, will need home 02, rollator for home- orders have been placed- HHRN/PT also ordered- CM spoke with pt at bedside for transition of care needs- list provided to pt per CMS gov website with star ratings for [facility] agency per pt he does not have a preverence- asked CM to call wife- TC made to wife to discuss [facility]- she also states she does not have preference- advised CM to "just find an agency that can provide services". Call made to [facility] who can not staff for needed services- call made to [facility] who has accepted referral - per pt he is going to stay at brother's house a few days' [address] [id number],  [place] with Brenton Grills at [facility] for DME needs- portable tank and rollator to be delivered to room prior to discharge. PCP- verified as Wyatt Portela.   Expected Discharge Date:  03/15/18               Expected Discharge Plan:  [place]  In-House Referral:  NA  Discharge planning Services  CM Consult  Post Acute Care Choice:  Durable Medical Equipment, Home Health Choice offered to:  Patient  DME Arranged:  [place] rolling with seat, Oxygen DME Agency:  [facility] Arranged:  RN, PT [facility] Agency:  [facility]  Status of Service:  Completed, signed off  If discussed at [place] of Stay Meetings, dates discussed:    Discharge Disposition: home/home health   Additional Comments:  Dawayne Patricia, RN 03/15/2018, 4:02 PM

## 2018-03-15 NOTE — Progress Notes (Signed)
  Speech Language Pathology Treatment: Dysphagia  Patient Details Name: Tony Berry MRN: [id number] DOB: [birth year] Today's Date: 03/15/2018 Time: 6568-1275 SLP Time Calculation (min) (ACUTE ONLY): 15 min  Assessment / Plan / Recommendation Clinical Impression  F/u after 12/20 MBS.  Reviewed results of exam; discussed intake and quality of swallowing over weekend.  Pt is frustrated, states he "can't get anything down." He has fluctuating understanding of his condition, various medical personnel and their roles.  We discussed pending Palliative Medicine consult - he asserted he had already talked with them today, but upon further inquiry it was clear he was speaking of the consultation for PEG.  We discussed the benefit of Palliative Medicine in his help with decision-making.  We discussed the long-term impact of radiation on swallowing - the immobilizing effect of radiation on muscle function and the ongoing struggle with aspiration and nutrition.  After our conversation, pt was able to re-state the basic facts of our discussion.  Continue recommendations for dysphagia 2/nectars for now; revisit diet after pt has an opportunity to meet with Palliative medicine. It would be beneficial for Tony Berry and his wife to talk with them before PEG placement to ensure that is the best decision for him.     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 Chest CT 12/18: "Worsening tree-in-bud infiltrates and focal consolidation consistent with multifocal pneumonia, given basilar predominance, aspiration is possible."  Pt reports being placed on thickened liquid during admission to [facility], but could not recall consistency.  Pt reports most recent swallow assessment by  [facility] 3-4 months previously with no abnormal findings.      SLP Plan  Continue with current plan of care       Recommendations  Diet recommendations: NPO(for procedure)   Dys 2/nectar once diet is resumed             Oral Care Recommendations: Oral care BID SLP Visit Diagnosis: Dysphagia, pharyngeal phase (R13.13) Plan: Continue with current plan of care       GO              Tony Berry, [place] Office number [phone] Pager [phone]   Tony Berry 03/15/2018, 11:47 AM

## 2018-03-15 NOTE — Consult Note (Signed)
Chief Complaint: Patient was seen in consultation today for percutaneous gastric tube placement Chief Complaint  Patient presents with  . URI   at the request of Dr Rebeca Alert   Supervising Physician: Marybelle Killings  Patient Status: [facility] - In-pt  History of Present Illness: Tony Berry is a 57 y.o. 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 Hypertension     Past Surgical History:  Procedure Laterality Date  . BACK SURGERY    . FOOT SURGERY      Allergies: Patient has no known allergies.  Medications: Prior to Admission medications   Medication Sig Start Date End Date Taking? Authorizing Provider  METHADONE HCL PO Take 115 mg by mouth once a week.   Yes [provider]  fluticasone (FLONASE) 50 MCG/ACT nasal spray Place 1 spray into both nostrils daily.    [provider]  Fluticasone-Umeclidin-Vilant (TRELEGY ELLIPTA) 100-62.5-25 MCG/INH AEPB Inhale 1 puff into the lungs daily.    [provider]     History reviewed. No pertinent family history.  Social History   Socioeconomic History  . Marital status: Married    Spouse name: Not on file  . Number of children: Not on file  . Years of education: Not on file  . Highest education level: Not on file  Occupational History  . Not on file  Social Needs  . Financial resource strain: Not on file  . Food insecurity:    Worry: Not on file    Inability: Not on file  . Transportation needs:    Medical: Not on file    Non-medical: Not on file  Tobacco Use  . Smoking status: Former Smoker    Packs/day: 1.00    Years: 30.00    Pack  years: 30.00    Types: Cigarettes    Start date: 03/11/1988    Last attempt to quit: 02/25/2018    Years since quitting: 0.0  . Smokeless tobacco: Never Used  . Tobacco comment: patient states he quit 2 weeks ago  Substance and Sexual Activity  . Alcohol use: No  . Drug use: No  . Sexual activity: Not on file  Lifestyle  . Physical activity:    Days per week: Not on file    Minutes per session: Not on file  . Stress: Not on file  Relationships  . Social connections:    Talks on phone: Not on file    Gets together: Not on file    Attends religious service: Not on file    Active member of club or organization: Not on file    Attends meetings of clubs or organizations: Not on file    Relationship status: Not on file  Other Topics Concern  . Not on file  Social History Narrative  . Not on file     Review of Systems: A 12 point ROS discussed and pertinent positives are indicated in the HPI above.  All other systems are negative.  Review of Systems  Constitutional: Positive for activity change, appetite change, fatigue and unexpected weight change. Negative for fever.  Respiratory: Positive for cough.   Gastrointestinal: Negative for abdominal pain.  Neurological:  Positive for weakness.  Psychiatric/Behavioral: Negative for behavioral problems and confusion.    Vital Signs: BP (!) 122/59 (BP Location: Right Arm)   Pulse 80   Temp 98.1 F (36.7 C) (Oral)   Resp 16   Ht 5\' 6"  (1.676 m)   Wt 114 lb 13.8 oz (52.1 kg)   SpO2 96%   BMI 18.54 kg/m   Physical Exam Vitals signs reviewed.  Neck:     Musculoskeletal: Normal range of motion.  Cardiovascular:     Rate and Rhythm: Normal rate and regular rhythm.  Pulmonary:     Effort: Pulmonary effort is normal.     Breath sounds: Normal breath sounds.  Abdominal:     General: Bowel sounds are normal.     Palpations: Abdomen is soft.     Tenderness: There is no abdominal tenderness.  Musculoskeletal: Normal range of  motion.  Skin:    General: Skin is dry.  Neurological:     General: No focal deficit present.     Mental Status: He is oriented to person, place, and time.  Psychiatric:        Mood and Affect: Mood normal.        Behavior: Behavior normal.        Thought Content: Thought content normal.        Judgment: Judgment normal.     Imaging: Ct Abdomen Wo Contrast  Result Date: 03/15/2018 CLINICAL DATA:  Preop gastrostomy EXAM: CT ABDOMEN WITHOUT CONTRAST TECHNIQUE: Multidetector CT imaging of the abdomen was performed following the standard protocol without IV contrast. COMPARISON:  PET-CT 01/06/2018 FINDINGS: Lower chest: Bilateral posterior lower lobe consolidation left greater than right is compatible with aspiration pneumonia. Centrilobular nodules are scattered throughout the remainder of the visualized lung bases. Hepatobiliary: Unremarkable Pancreas: Unremarkable Spleen: Unremarkable Adrenals/Urinary Tract: Unremarkable Stomach/Bowel: The stomach is well positioned against the anterior abdominal wall. There is no disproportionate dilatation of bowel to suggest obstruction. Vascular/Lymphatic: Atherosclerotic vascular calcifications in the aorta and iliac arteries. No obvious abnormal retroperitoneal adenopathy. Other: No free fluid. Musculoskeletal: No vertebral compression deformity. IMPRESSION: There is favorable anatomy for gastrostomy tube placement. Bilateral lower lobe consolidation is worrisome for aspiration pneumonia. Electronically Signed   By: Marybelle Killings M.D.   On: 03/15/2018 07:24   Dg Chest 2 View  Result Date: 03/10/2018 CLINICAL DATA:  Follow-up pneumonia. EXAM: CHEST - 2 VIEW COMPARISON:  02/03/2018 FINDINGS: Heart size is normal. Aortic atherosclerosis. Patchy bilateral lower lobe pneumonia, worsened when compared to the study of November. No effusion. Upper lobes remain clear. IMPRESSION: Worsening of bilateral lower lobe pneumonia compared to the study of 02/03/2018.  Electronically Signed   By: Nelson Chimes M.D.   On: 03/10/2018 16:06   Ct Soft Tissue Neck W Contrast  Result Date: 03/10/2018 CLINICAL DATA:  57 y/o M; history of throat cancer presenting with hoarseness. Concern for recurrent tumor. EXAM: CT NECK WITH CONTRAST TECHNIQUE: Multidetector CT imaging of the neck was performed using the standard protocol following the bolus administration of intravenous contrast. CONTRAST:  173mL ISOVUE-370 IOPAMIDOL (ISOVUE-370) INJECTION 76% COMPARISON:  02/01/2018, 08/25/2017, 11/03/2016 CT neck. 01/06/2018 PET-CT. FINDINGS: Pharynx and larynx: Low-attenuation thickening of supraglottic mucosa, aryepiglottic folds, epiglottis. Linear non masslike enhancement of superficial laryngeal mucosa. No discrete mass lesion identified. Patent airway. Salivary glands: No inflammation, mass, or stone. Thyroid: Normal. Lymph nodes: Stable right supraclavicular lymph node measuring 15 x 9 mm (series 2, image 79). Additional cervical lymph nodes are subcentimeter and stable. Vascular: Mixed  plaque of the bilateral carotid bifurcations. Mild left proximal ICA stenosis. Limited intracranial: Negative. Visualized orbits: Negative. Mastoids and visualized paranasal sinuses: Clear. Skeleton: Advanced multilevel cervical spondylosis with moderate to severe spinal canal stenosis at the C5-6 level. Uncovertebral and facet hypertrophy encroaches on neural foramen throughout the cervical spine. Upper chest: Waxing and waning clustered ground-glass nodules in bronchovascular distribution in the right upper lobe likely related to bronchiolitis. Moderate emphysema. Other: None. IMPRESSION: 1. Linear non masslike enhancement of superficial supraglottic mucosa as well as low-attenuation mucosal thickening of supraglottic mucosa and epiglottis is stable from prior CT of the neck. Findings likely represent posttreatment changes. No discrete mass identified. Patent airway. 2. Stable cervical lymph nodes. 3.  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 4. Waxing and waning ground-glass nodules in right upper lobe, likely minimal bronchiolitis. 5. Emphysema. Electronically Signed   By: Kristine Garbe M.D.   On: 03/10/2018 16:44   Ct Angio Chest Pe W And/or Wo Contrast  Result Date: 03/10/2018 CLINICAL DATA:  Chills. Recent diagnosis of pneumonia. History of head and neck cancer. EXAM: CT ANGIOGRAPHY CHEST WITH CONTRAST TECHNIQUE: Multidetector CT imaging of the chest was performed using the standard protocol during bolus administration of intravenous contrast. Multiplanar CT image reconstructions and MIPs were obtained to evaluate the vascular anatomy. CONTRAST:  158mL ISOVUE-370 IOPAMIDOL (ISOVUE-370) INJECTION 76% COMPARISON:  Chest radiograph March 10, 2018 and CT chest January 31, 2018 FINDINGS: CARDIOVASCULAR: Adequate contrast opacification of the pulmonary artery's. Main pulmonary artery is not enlarged. No pulmonary arterial filling defects to the level of the subsegmental branches. Heart size is normal, no right heart strain. No pericardial effusion. Thoracic aorta is normal course and caliber, mild calcific atherosclerosis. MEDIASTINUM/NODES: No lymphadenopathy by CT size criteria. LUNGS/PLEURA: Tracheobronchial tree is patent, no pneumothorax. Worsening tree-in-bud infiltrates and multifocal consolidation with a basilar predominance. No discrete pulmonary nodule in LEFT lower lobe. No pleural effusion. Moderate centrilobular emphysema with increased lung volumes, flattened hemidiaphragms. UPPER ABDOMEN: Non-acute. MUSCULOSKELETAL: Non-acute. Review of the MIP images confirms the above findings. IMPRESSION: 1. Worsening tree-in-bud infiltrates and focal consolidation consistent with multifocal pneumonia, given basilar predominance, aspiration is possible. Aortic Atherosclerosis (ICD10-I70.0) and Emphysema (ICD10-J43.9). Electronically  Signed   By: Elon Alas M.D.   On: 03/10/2018 16:43   Dg Swallowing Func-speech Pathology  Result Date: 03/12/2018 Objective Swallowing Evaluation: Type of Study: MBS-Modified Barium Swallow Study  Patient Details Name: Tony Berry MRN: [id number] Date of Birth: 12-26-1960 Today's Date: 03/12/2018 Time: SLP Start Time (ACUTE ONLY): 58 -SLP Stop Time (ACUTE ONLY): 1200 SLP Time Calculation (min) (ACUTE ONLY): 30 min Past Medical History: Past Medical History: Diagnosis Date . Hypertension  Past Surgical History: Past Surgical History: Procedure Laterality Date . BACK SURGERY   . FOOT SURGERY   HPI: Mr. 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 Chest CT 12/18: "Worsening tree-in-bud infiltrates and focal consolidation consistent with multifocal pneumonia, given basilar predominance, aspiration is possible."  Pt reports being placed on thickened liquid during admission to [facility], but could not recall consistency.  Pt reports most recent swallow assessment by [facility] 3-4 months previously with no abnormal findings.  Subjective: alert, participatory Assessment / Plan / Recommendation Pt presents with a chronic pharyngeal dysphagia  that appears to be deteriorating per pt's account.  There are areas of thickening/abnormal soft tissue of pharyngeal wall, epiglottis, and periarytenoids - this can be visualized when coated with barium.  Pt demonstrates reduced velopharyngeal approximation. He is unable to achieve laryngeal vestibular closure, which leads to consistent penetration of all boluses into the larynx.  A portion of this material passes through vocal folds and into trachea.  Aspiration elicits a cough.  He is able to eject a portion back into pharynx which passes  through UES.  There is aspiration of all consistences - liquid and solid - either before or after the swallow event.  He attempted multiple postural adjustments and strategies to improve airway protection (head tilts and turns left and right, chin tuck, supraglottic swallow) -  these were not effective. He expectorates residuals from pharynx.  Pt's voice is generally aphonic with some ability to achieve voice with effort.     We discussed the MBS and reviewed the video in real time and afterward in his room.  Pt wants to continue eating; he verbalizes understanding that a percentage of all that he eats/drinks is aspirated; he is willing to consider a PEG again for improved nutrition.  Discussed results with Dr. Rebeca Alert. Deterioration in swallow function can be attributable to late effects of radiation/fibrosis, but pt needs to be seen either by his primary ENT or an ENT here for further w/u, repeat laryngoscopy given anatomy and voice changes.  Continue soft diet, nectar-thick liquids for now (nectars tolerated marginally better than thin liquids).  SLP will follow for plan, further education with pt.    CHL IP TREATMENT RECOMMENDATION 03/12/2018 Treatment Recommendations Defer until completion of intrumental exam;Therapy as outlined in treatment plan below   Prognosis 03/12/2018 Prognosis for Safe Diet Advancement Guarded Barriers to Reach Goals -- Barriers/Prognosis Comment -- CHL IP DIET RECOMMENDATION 03/12/2018 SLP Diet Recommendations Dysphagia 2 (Fine chop) solids;Nectar thick liquid Liquid Administration via Cup Medication Administration Crushed with puree Compensations Small sips/bites;Clear throat intermittently Postural Changes Remain semi-upright after after feeds/meals (Comment)   CHL IP OTHER RECOMMENDATIONS 03/12/2018 Recommended Consults Consider ENT evaluation Oral Care Recommendations Oral care BID Other Recommendations Order thickener from pharmacy   CHL IP FOLLOW UP RECOMMENDATIONS 03/12/2018  Follow up Recommendations Other (comment)   CHL IP FREQUENCY AND DURATION 03/12/2018 Speech Therapy Frequency (ACUTE ONLY) min 2x/week Treatment Duration 1 week      CHL IP ORAL PHASE 03/12/2018 Oral Phase WFL Oral - Pudding Teaspoon -- Oral - Pudding Cup -- Oral - Honey Teaspoon -- Oral - Honey Cup -- Oral - Nectar Teaspoon -- Oral - Nectar Cup -- Oral - Nectar Straw -- Oral - Thin Teaspoon -- Oral - Thin Cup -- Oral - Thin Straw -- Oral - Puree -- Oral - Mech Soft -- Oral - Regular -- Oral - Multi-Consistency -- Oral - Pill -- Oral Phase - Comment --  CHL IP PHARYNGEAL PHASE 03/12/2018 Pharyngeal Phase Impaired Pharyngeal- Pudding Teaspoon -- Pharyngeal -- Pharyngeal- Pudding Cup -- Pharyngeal -- Pharyngeal- Honey Teaspoon -- Pharyngeal -- Pharyngeal- Honey Cup -- Pharyngeal -- Pharyngeal- Nectar Teaspoon Delayed swallow initiation-pyriform sinuses;Reduced pharyngeal peristalsis;Reduced epiglottic inversion;Reduced anterior laryngeal mobility;Reduced airway/laryngeal closure;Penetration/Aspiration before swallow;Penetration/Apiration after swallow;Pharyngeal residue - pyriform;Trace aspiration Pharyngeal Material enters airway, passes BELOW cords and not ejected out despite cough attempt by patient Pharyngeal- Nectar Cup Delayed swallow initiation-pyriform sinuses;Reduced pharyngeal peristalsis;Reduced epiglottic inversion;Reduced anterior laryngeal mobility;Reduced airway/laryngeal closure;Penetration/Aspiration before swallow;Penetration/Apiration after swallow;Trace aspiration;Pharyngeal residue - pyriform;Pharyngeal residue - valleculae Pharyngeal Material  enters airway, passes BELOW cords and not ejected out despite cough attempt by patient Pharyngeal- Nectar Straw -- Pharyngeal -- Pharyngeal- Thin Teaspoon Delayed swallow initiation-pyriform sinuses;Reduced pharyngeal peristalsis;Reduced epiglottic inversion;Reduced anterior laryngeal mobility;Reduced airway/laryngeal closure;Penetration/Aspiration before  swallow;Penetration/Apiration after swallow;Trace aspiration Pharyngeal Material enters airway, passes BELOW cords and not ejected out despite cough attempt by patient Pharyngeal- Thin Cup Delayed swallow initiation-pyriform sinuses;Reduced pharyngeal peristalsis;Reduced epiglottic inversion;Reduced anterior laryngeal mobility;Reduced airway/laryngeal closure;Penetration/Aspiration before swallow;Penetration/Apiration after swallow;Trace aspiration Pharyngeal Material enters airway, passes BELOW cords and not ejected out despite cough attempt by patient Pharyngeal- Thin Straw -- Pharyngeal -- Pharyngeal- Puree Delayed swallow initiation-pyriform sinuses;Reduced pharyngeal peristalsis;Reduced epiglottic inversion;Reduced anterior laryngeal mobility;Reduced airway/laryngeal closure;Penetration/Apiration after swallow;Penetration/Aspiration before swallow;Pharyngeal residue - pyriform;Pharyngeal residue - valleculae Pharyngeal Material enters airway, passes BELOW cords and not ejected out despite cough attempt by patient Pharyngeal- Mechanical Soft -- Pharyngeal -- Pharyngeal- Regular -- Pharyngeal -- Pharyngeal- Multi-consistency -- Pharyngeal -- Pharyngeal- Pill -- Pharyngeal -- Pharyngeal Comment --  CHL IP CERVICAL ESOPHAGEAL PHASE 03/12/2018 Cervical Esophageal Phase (No Data) Pudding Teaspoon -- Pudding Cup -- Honey Teaspoon -- Honey Cup -- Nectar Teaspoon -- Nectar Cup -- Nectar Straw -- Thin Teaspoon -- Thin Cup -- Thin Straw -- Puree -- Mechanical Soft -- Regular -- Multi-consistency -- Pill -- Cervical Esophageal Comment -- Tony Berry 03/12/2018, 2:39 PM              US Abdomen Limited Ruq  Result Date: 03/11/2018 CLINICAL DATA:  Elevated LFT EXAM: ULTRASOUND ABDOMEN LIMITED RIGHT UPPER QUADRANT COMPARISON:  PET-CT 01/06/2017 FINDINGS: Gallbladder: No gallstones or wall thickening visualized. No sonographic Murphy sign noted by sonographer. Common bile duct: Diameter: Dilated up to 13.2 mm.  Liver: No focal lesion identified. Within normal limits in parenchymal echogenicity. Portal vein is patent on color Doppler imaging with normal direction of blood flow towards the liver. Trace free fluid adjacent to the liver. Incidental note made of small right pleural effusion IMPRESSION: 1. Negative for gallstones. 2. Dilated common bile duct up to 13.2 mm. Further evaluation with MRCP could be obtained as indicated. 3. Trace free fluid in the right upper quadrant and small right pleural effusion. Electronically Signed   By: Donavan Foil M.D.   On: 03/11/2018 18:25    Labs:  CBC: Recent Labs    03/11/18 1435 03/12/18 0355 03/13/18 0237 03/15/18 0335  WBC 13.8* 9.2 8.2 5.2  HGB 10.1* 9.9* 9.8* 9.5*  HCT 31.9* 32.1* 31.5* 30.8*  PLT 180 193 200 197    COAGS: Recent Labs    03/11/18 1435  INR 1.41    BMP: Recent Labs    03/12/18 0355 03/13/18 0237 03/14/18 0350 03/15/18 0335  NA 134* 134* 135 134*  K 4.7 4.1 4.1 4.1  CL 96* 91* 90* 89*  CO2 31 35* 36* 38*  GLUCOSE 71 72 68* 79  BUN 16 8 7  <5*  CALCIUM 8.0* 7.9* 8.1* 8.0*  CREATININE 0.49* 0.42* 0.50* 0.44*  GFRNONAA >60 >60 >60 >60  GFRAA >60 >60 >60 >60    LIVER FUNCTION TESTS: Recent Labs    03/12/18 0355 03/13/18 0237 03/14/18 0350 03/15/18 0335  BILITOT 0.7 0.4 0.6 0.3  AST 255* 181* 85* 41  ALT 249* 215* 156* 111*  ALKPHOS 43 42 40 37*  PROT 5.5* 5.7* 5.9* 5.6*  ALBUMIN 1.9* 1.9* 1.9* 2.0*    TUMOR MARKERS: No results for input(s): AFPTM, CEA, CA199, CHROMGRNA in the last 8760 hours.  Assessment and Plan:  Throat cancer Post  radiation Worsening dysphagia-- protein calorie malnutrition Scheduled for percutaneous gastric tube placement Plavix to be held 5 days--- MD to ask if can come off Risks and benefits discussed with the patient including, but not limited to the need for a barium enema during the procedure, bleeding, infection, peritonitis, or damage to adjacent structures.  All of the  patient's questions were answered, patient is agreeable to proceed. Consent signed and in chart.  Await to hear from MD about Plavix issue--- pt aware  Thank you for this interesting consult.  I greatly enjoyed meeting Jordan M Stettler and look forward to participating in their care.  A copy of this report was sent to the requesting provider on this date.  Electronically Signed: Lavonia Drafts, PA-C 03/15/2018, 11:12 AM   I spent a total of 40 Minutes    in face to face in clinical consultation, greater than 50% of which was counseling/coordinating care for perc gastric tube placement

## 2018-03-16 NOTE — Discharge Summary (Addendum)
Name: Tony Berry MRN: [id number] DOB: 12/27/1960 57 y.o. PCP: Garnette Czech, MD  Date of Admission: 03/10/2018  2:14 PM Date of Discharge: 03/15/2018 Attending Physician: Oda Kilts, MD  Discharge Diagnosis: 1. Multifocal Aspiration Pneumonia 2. Constriction Pericarditis 3. Transaminitis 4. Esophageal Dysmotility  5. Severe Malnutrition  Discharge Medications: Allergies as of 03/15/2018   No Known Allergies     Medication List    TAKE these medications   aspirin 81 MG chewable tablet Chew 1 tablet (81 mg total) by mouth daily. Please do not take for five days before your gastrostomy tube placement procedure   clopidogrel 75 MG tablet Commonly known as:  PLAVIX Take 1 tablet (75 mg total) by mouth daily. Please do not take for the 5 days before your gastrostomy tube procedure   feeding supplement (ENSURE ENLIVE) Liqd Take 237 mLs by mouth 3 (three) times daily between meals.   fluticasone 50 MCG/ACT nasal spray Commonly known as:  FLONASE Place 1 spray into both nostrils daily.   levofloxacin 750 MG tablet Commonly known as:  LEVAQUIN Take 1 tablet (750 mg total) by mouth daily for 2 days.   METHADONE HCL PO Take 115 mg by mouth once a week.   nicotine 21 mg/24hr patch Commonly known as:  NICODERM CQ - dosed in mg/24 hours Place 1 patch (21 mg total) onto the skin daily.   polyethylene glycol packet Commonly known as:  MIRALAX / GLYCOLAX Take 17 g by mouth daily.   RESOURCE THICKENUP CLEAR Powd Take 120 g by mouth as needed.   senna-docusate 8.6-50 MG tablet Commonly known as:  Senokot-S Take 1 tablet by mouth at bedtime as needed for mild constipation.   TRELEGY ELLIPTA 100-62.5-25 MCG/INH Aepb Generic drug:  Fluticasone-Umeclidin-Vilant Inhale 1 puff into the lungs daily.       Disposition and follow-up:   Mr.Tony Berry was discharged from [facility] in Good condition.  At the hospital follow up visit please  address:  1. Multifocal Aspiration Pneumonia: Patient was discharged home with 2 more days of antibiotics.  Patient was discharged home with oxygen, please reevaluate need.   Constriction Pericarditis: Patient had been started on Plavix but this was held on discharge for a PEG tube procedure.  Please make sure that he restarted following the procedure.  Esophageal Dysmotility/ Severe Malnutrition: IR scheduler should have contacted patient to schedule PEG tube procedure.  Discharge weight was 52.1 kg.  2.  Labs / imaging needed at time of follow-up: CBC, BMP, LFTs  3.  Pending labs/ test needing follow-up: None  Follow-up Appointments: Follow-up Information    Nigel Mormon, MD Follow up on 04/05/2018.   Specialty:  Cardiology Why:  11:00 AM Contact information: [place] [address] [phone]        Aletta Edouard, MD. Schedule an appointment as soon as possible for a visit.   Specialties:  Interventional Radiology, Radiology Why:  You will be called with an appointment for your PEG tube procedure.  For now do not start your aspirin or plavix.  You will need to be off aspirin and plavix for 5 days prior to your procedure. Contact information: [address] [place] [address] [phone]        [facility] - Dme Follow up.   Why:  Rollator and home 02 arranged- portable tank to be delivered to room along with rollator Contact information: [address]  [place] [address] [phone]        [facility]-Home Follow up.   Specialty:  [place] Why:  HHRN/PT arranged- they will call you to set up home visits Contact information: [place] [address] [phone]           Hospital Course by problem list: 1. Septic Multifocal Aspiration Pneumonia 57 yo male [place] esophageal cancer s/p radiationending 7 months ago, malnutrition, chronic back pain  on methadone with recent admission to [facility] one month ago for pneumonia who presented with hypotension, SOB and weakness that had been worsening since previous discharge. He had had difficulty with swallowing since radiation treatment and was found to be high risk for aspiration. Chest x-ray showed bilateral lower lobe airspace disease, CT scan showed worsening tree-in-bud infiltrates and focal consolidation consistent with multifocal pneumonia. Blood pressure normalized with multiple fluid boluses and  Initially started on vanc, cefepime, and flagyl IV he was narrowed to cefepime alone.  Patient had some improvement still requiring oxygen discharge, oxygen saturation dropped down to 86% with ambulation.  Patient was discharged home with oxygen and home health.  He was also discharged home with 2 more days of antibiotics.  2. Constriction Pericarditis Presented with JVD and troponin .9 >> .6  Concerning for ischemia with no history of known previously treated heart disease. Echo was done and showed dilated IVC and concern for cardiac constriction, likely secondary to radiation therapy. Cardiology was consulted and due to patients chronic malnutrition and medical condition, they would not recommend major surgery. Recommended plavix 75 mg and asa 81 mg with invasive workup if his condition improved.   3. Transaminitis: CMP showed a elevated AST 590 and ALT 360.  Hepatitis labs were ordered which were normal.  Possibly related to severe hypertension.  Transaminitis improved after antibiotics and IV fluids.  Would recommend repeat evaluation of LFTs when he is back to normal.  4. Severe Malnutrition 2/2 Esophageal Dysmotility  Mr. College has had difficulty swallowing since radiation therapy, which has slowly worsened over time. Speech consulted and recommended ENT consult.  He has had difficulty eating, swallowing and speaking due to sore throat.  He was initially placed on dysphagia 2 diet discussed  the risks of getting a PEG tube placed.  ENT was consulted, a dysphagia is likely secondary to postradiation changes and did not recommend any ENT intervention at that time.  He will need a thoracic evaluation of his pulmonary nodules.  Patient was scheduled obtain a PEG tube however since patient was recently started on Plavix IR wants him to hold Plavix for 5 days prior to PEG tube placement.  Patient was discharged with holding plavix, IR will contact him to set up PEG tube placement in outpatient setting.  5. OUD: Patient was continued on his home 115 mg methadone.   Discharge Vitals:   BP 106/61 (BP Location: Right Arm)   Pulse 71   Temp 98.1 F (36.7 C) (Oral)   Resp 18   Ht 5\' 6"  (1.676 m)   Wt 52.1 kg   SpO2 94%   BMI 18.54 kg/m   Pertinent Labs, Studies, and Procedures:  CBC Latest Ref Rng & Units 03/15/2018 03/13/2018 03/12/2018  WBC 4.0 - 10.5 K/uL 5.2 8.2 9.2  Hemoglobin 13.0 - 17.0 g/dL 9.5(L) 9.8(L) 9.9(L)  Hematocrit 39.0 - 52.0 % 30.8(L) 31.5(L) 32.1(L)  Platelets 150 - 400 K/uL 197 200 193   BMP Latest Ref Rng &  Units 03/15/2018 03/14/2018 03/13/2018  Glucose 70 - 99 mg/dL 79 68(L) 72  BUN 6 - 20 mg/dL <5(L) 7 8  Creatinine 0.61 - 1.24 mg/dL 0.44(L) 0.50(L) 0.42(L)  Sodium 135 - 145 mmol/L 134(L) 135 134(L)  Potassium 3.5 - 5.1 mmol/L 4.1 4.1 4.1  Chloride 98 - 111 mmol/L 89(L) 90(L) 91(L)  CO2 22 - 32 mmol/L 38(H) 36(H) 35(H)  Calcium 8.9 - 10.3 mg/dL 8.0(L) 8.1(L) 7.9(L)     Echo 12/20 Impressions:  - Mild respiratory related leftward septal shift with prominence of the pericardium posteriorly, with dilated IVC. On review of clinical history, cannot exclude pericardial constriction.  Recommendations: Consider cardiac MRI with free breathing sequences and gadolinium contrast to assess for pericardial constriction if clinically indicated.  Blood Cultures 12/18: Negative  MRSA by PCR: Negative  [place] Abd RUQ 12/20 IMPRESSION: 1. Negative  for gallstones. 2. Dilated common bile duct up to 13.2 mm. Further evaluation with MRCP could be obtained as indicated. 3. Trace free fluid in the right upper quadrant and small right pleural effusion. Electronically Signed By: Donavan Foil M.D. On: 03/11/2018 18:25  CT Angio Chest 12/18 IMPRESSION: 1. Worsening tree-in-bud infiltrates and focal consolidation consistent with multifocal pneumonia, given basilar predominance, aspiration is possible.  Aortic Atherosclerosis (ICD10-I70.0) and Emphysema (ICD10-J43.9). Electronically Signed By: Elon Alas M.D. On: 03/10/2018 16:43  CT Soft Tissue Neck 12/18 IMPRESSION: 1. Linear non masslike enhancement of superficial supraglottic mucosa as well as low-attenuation mucosal thickening of supraglottic mucosa and epiglottis is stable from prior CT of the neck. Findings likely represent posttreatment changes. No discrete mass identified. Patent airway. 2. Stable cervical lymph nodes. 3. 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 4. Waxing and waning ground-glass nodules in right upper lobe, likely minimal bronchiolitis. 5. Emphysema. Electronically Signed By: Kristine Garbe M.D. On: 03/10/2018 16:44  CXR 12/18 IMPRESSION: Worsening of bilateral lower lobe pneumonia compared to the study of 02/03/2018. Electronically Signed By: Nelson Chimes M.D. On: 03/10/2018 16:06  Discharge Instructions: Discharge Instructions    Diet - low sodium heart healthy   Complete by:  As directed    Discharge instructions   Complete by:  As directed    Mr. Cookston you will require 2 more days of oral antibiotics for your pneumonia.  You will need to begin using supplemental oxygen 2 L by nasal cannula when you are exerting herself.  Additionally will be important for you to follow-up with interventional radiology to have your PEG tube placed.  In order  for this to be accomplished she will need to stop your your aspirin and Plavix for 5 days before the procedure.  Date of your procedure can be found in the follow-up provider section of your discharge summary.  In the meantime we have tried to provide you with some thickening up liquid material to help prevent you from choking and we have supplemented some of your nutrition with prescribing some shakes.  Please continue to work with your pulmonologist to diagnose the "spots" on your lungs that she were discussing with [place].  It would also be beneficial if you followed up with your oncologist who treated your original cancer.   Increase activity slowly   Complete by:  As directed       Signed: Asencion Noble, MD 03/16/2018, 11:52 AM   Pager: [phone]   Internal Medicine Attending Note:  I saw and examined the patient on the day of discharge. I reviewed and agree  with the discharge summary written by the house staff.  Lenice Pressman, M.D., Ph.D.

## 2018-03-18 ENCOUNTER — Other Ambulatory Visit (HOSPITAL_COMMUNITY): Payer: Self-pay | Admitting: Internal Medicine

## 2018-03-18 ENCOUNTER — Telehealth (HOSPITAL_COMMUNITY): Payer: Self-pay | Admitting: Radiology

## 2018-03-18 DIAGNOSIS — R1310 Dysphagia, unspecified: Secondary | ICD-10-CM

## 2018-03-18 NOTE — Telephone Encounter (Signed)
Called pt, left VM for him to schedule PEG placement. JM

## 2018-03-23 ENCOUNTER — Other Ambulatory Visit: Payer: Self-pay | Admitting: Radiology

## 2018-03-25 ENCOUNTER — Encounter (HOSPITAL_COMMUNITY): Payer: Self-pay

## 2018-03-25 ENCOUNTER — Other Ambulatory Visit: Payer: Self-pay

## 2018-03-25 ENCOUNTER — Ambulatory Visit (HOSPITAL_COMMUNITY)
Admission: RE | Admit: 2018-03-25 | Discharge: 2018-03-25 | Disposition: A | Discharge status: Home / Self Care | Payer: Medicaid Other | Source: Ambulatory Visit | Attending: Interventional Radiology | Admitting: Interventional Radiology

## 2018-03-25 DIAGNOSIS — I10 Essential (primary) hypertension: Secondary | ICD-10-CM | POA: Diagnosis not present

## 2018-03-25 DIAGNOSIS — Z5309 Procedure and treatment not carried out because of other contraindication: Secondary | ICD-10-CM | POA: Insufficient documentation

## 2018-03-25 DIAGNOSIS — Z7902 Long term (current) use of antithrombotics/antiplatelets: Secondary | ICD-10-CM | POA: Diagnosis not present

## 2018-03-25 DIAGNOSIS — Z7951 Long term (current) use of inhaled steroids: Secondary | ICD-10-CM | POA: Diagnosis not present

## 2018-03-25 DIAGNOSIS — E46 Unspecified protein-calorie malnutrition: Secondary | ICD-10-CM | POA: Diagnosis not present

## 2018-03-25 DIAGNOSIS — C140 Malignant neoplasm of pharynx, unspecified: Secondary | ICD-10-CM | POA: Diagnosis not present

## 2018-03-25 DIAGNOSIS — Z681 Body mass index (BMI) 19 or less, adult: Secondary | ICD-10-CM | POA: Diagnosis not present

## 2018-03-25 DIAGNOSIS — Z87891 Personal history of nicotine dependence: Secondary | ICD-10-CM | POA: Insufficient documentation

## 2018-03-25 DIAGNOSIS — Z7982 Long term (current) use of aspirin: Secondary | ICD-10-CM | POA: Diagnosis not present

## 2018-03-25 DIAGNOSIS — R1310 Dysphagia, unspecified: Secondary | ICD-10-CM

## 2018-03-25 LAB — CBC
HCT: 40.6 % (ref 39.0–52.0)
Hemoglobin: 12.1 g/dL — ABNORMAL LOW (ref 13.0–17.0)
MCH: 28.9 pg (ref 26.0–34.0)
MCHC: 29.8 g/dL — ABNORMAL LOW (ref 30.0–36.0)
MCV: 97.1 fL (ref 80.0–100.0)
Platelets: 405 10*3/uL — ABNORMAL HIGH (ref 150–400)
RBC: 4.18 MIL/uL — AB (ref 4.22–5.81)
RDW: 12.6 % (ref 11.5–15.5)
WBC: 8 10*3/uL (ref 4.0–10.5)
nRBC: 0 % (ref 0.0–0.2)

## 2018-03-25 LAB — BASIC METABOLIC PANEL
Anion gap: 6 (ref 5–15)
BUN: <5 mg/dL — ABNORMAL LOW (ref 6–20)
CO2: 36 mmol/L — ABNORMAL HIGH (ref 22–32)
Calcium: 8.9 mg/dL (ref 8.9–10.3)
Chloride: 94 mmol/L — ABNORMAL LOW (ref 98–111)
Creatinine, Ser: 0.55 mg/dL — ABNORMAL LOW (ref 0.61–1.24)
GFR calc Af Amer: >60 mL/min (ref >60)
GFR calc non Af Amer: >60 mL/min (ref >60)
GLUCOSE: 95 mg/dL (ref 70–99)
Potassium: 3.9 mmol/L (ref 3.5–5.1)
Sodium: 136 mmol/L (ref 135–145)

## 2018-03-25 LAB — PROTIME-INR
INR: 1.06
Prothrombin Time: 13.7 seconds (ref 11.4–15.2)

## 2018-03-25 MED ORDER — SODIUM CHLORIDE 0.9 % IV SOLN
INTRAVENOUS | Status: DC | PRN
  Administered 2018-03-25: 10 mL/h via INTRAVENOUS

## 2018-03-25 MED ORDER — SODIUM CHLORIDE 0.9 % IV SOLN: INTRAVENOUS | Status: DC

## 2018-03-25 MED ORDER — CEFAZOLIN SODIUM-DEXTROSE 2-4 GM/100ML-% IV SOLN: 2.0000 g | INTRAVENOUS | Status: DC

## 2018-03-25 NOTE — Progress Notes (Signed)
   Pt was scheduled today for percutaneous gastric tube placement  Pt is not established with any primary MD Has completed Tx with Dr Lavera Guise at [place]  Discussed with Dr Barbie Banner Pt has no MD to follow G tube as OP  Pt states he is able to drink fluids and shakes Still some wt loss but has slowed  Will cancel G tube now per Dr Barbie Banner Must have PMD following pt as OP  Encourage pt to establish with PMD Or re establish with Onc MD  Pt has asked for # to [facility] at [facility] Provided to he and brother today

## 2018-03-25 NOTE — H&P (Addendum)
Chief Complaint: Patient was seen in consultation today for percutaneous gastric tube placement at the request of Asencion Noble  Referring Physician(s): Asencion Noble  Supervising Physician: Marybelle Killings  Patient Status: [facility] - Out-pt  History of Present Illness: Tony Berry is a 58 y.o. 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 Protein calorie malnutrition Request for percutaneous gastric tube placement Imaging reviewed with Dr Josue Hector procedure  On Plavix x 3 days -(hold 5 days) Call into Dr Rebeca Alert about same   Pt has had G tube before - approx 1 yr ago- placed at [facility] He and wife are versed in usage and limitations [employer] was consulted at [place] 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 Cancer ([place])    Throat cancer  . Hypertension     Past Surgical History:  Procedure Laterality Date  . BACK SURGERY    . FOOT SURGERY      Allergies: Patient has no known allergies.  Medications: Prior to Admission medications   Medication Sig Start Date End Date Taking? Authorizing Provider  aspirin 81 MG chewable tablet Chew 1 tablet (81 mg total) by mouth daily. Please do not take for five days before your gastrostomy tube placement procedure 03/16/18  Yes Asencion Noble, MD  clopidogrel (PLAVIX) 75 MG tablet Take 1 tablet (75 mg total) by mouth daily. Please do not take for the 5 days before your gastrostomy tube procedure 03/15/18   Yes Asencion Noble, MD  feeding supplement, ENSURE ENLIVE, (ENSURE ENLIVE) LIQD Take 237 mLs by mouth 3 (three) times daily between meals. 03/15/18 04/14/18 Yes Asencion Noble, MD  fluticasone (FLONASE) 50 MCG/ACT nasal spray Place 1 spray into both nostrils daily.   Yes [provider]  Fluticasone-Umeclidin-Vilant (TRELEGY ELLIPTA) 100-62.5-25 MCG/INH AEPB Inhale 1 puff into the lungs daily.   Yes [provider]  Maltodextrin-Xanthan Gum (RESOURCE THICKENUP CLEAR) POWD Take 120 g by mouth as needed. 03/15/18  Yes Asencion Noble, MD  METHADONE HCL PO Take 115 mg by mouth once a week.   Yes [provider]  nicotine (NICODERM CQ - DOSED IN MG/24 HOURS) 21 mg/24hr patch Place 1 patch (21 mg total) onto the skin daily. 03/16/18  Yes Asencion Noble, MD  polyethylene glycol [facility] Floria Raveling) packet Take 17 g by mouth daily. 03/16/18  Yes Asencion Noble, MD  senna-docusate (SENOKOT-S) 8.6-50 MG tablet Take 1 tablet by mouth at bedtime as needed for mild constipation. 03/15/18  Yes Asencion Noble, MD     History reviewed. No pertinent family history.  Social History   Socioeconomic History  . Marital status: Married    Spouse name: Not on file  . Number of children: Not on file  . Years of education: Not on file  . Highest education level: Not on file  Occupational History  . Not on file  Social Needs  . Financial resource strain: Not on file  . Food insecurity:  Worry: Not on file    Inability: Not on file  . Transportation needs:    Medical: Not on file    Non-medical: Not on file  Tobacco Use  . Smoking status: Former Smoker    Packs/day: 1.00    Years: 30.00    Pack years: 30.00    Types: Cigarettes    Start date: 03/11/1988    Last attempt to quit: 02/25/2018    Years since quitting: 0.0  . Smokeless tobacco: Never Used  . Tobacco comment: patient states he quit 2 weeks ago  Substance and Sexual Activity  . Alcohol use:  No  . Drug use: No  . Sexual activity: Not on file  Lifestyle  . Physical activity:    Days per week: Not on file    Minutes per session: Not on file  . Stress: Not on file  Relationships  . Social connections:    Talks on phone: Not on file    Gets together: Not on file    Attends religious service: Not on file    Active member of club or organization: Not on file    Attends meetings of clubs or organizations: Not on file    Relationship status: Not on file  Other Topics Concern  . Not on file  Social History Narrative  . Not on file    Review of Systems: A 12 point ROS discussed and pertinent positives are indicated in the HPI above.  All other systems are negative.  Review of Systems  Constitutional: Positive for activity change, appetite change, fatigue and unexpected weight change. Negative for fever.  Respiratory: Negative for shortness of breath.   Cardiovascular: Negative for chest pain.  Gastrointestinal: Negative for abdominal pain.  Musculoskeletal: Negative for back pain.  Neurological: Positive for weakness.  Psychiatric/Behavioral: Negative for behavioral problems and confusion.    Vital Signs: BP (!) 149/74   Pulse 65   Temp 97.6 F (36.4 C)   Resp 18   Ht 5\' 6"  (1.676 m)   Wt 115 lb (52.2 kg)   SpO2 91%   BMI 18.56 kg/m   Physical Exam Vitals signs reviewed.  Cardiovascular:     Rate and Rhythm: Normal rate and regular rhythm.  Pulmonary:     Effort: Pulmonary effort is normal.     Breath sounds: Normal breath sounds.  Abdominal:     General: Bowel sounds are normal.     Palpations: Abdomen is soft.  Musculoskeletal: Normal range of motion.  Skin:    General: Skin is warm and dry.  Neurological:     General: No focal deficit present.     Mental Status: He is alert.  Psychiatric:        Mood and Affect: Mood normal.        Behavior: Behavior normal.        Thought Content: Thought content normal.        Judgment: Judgment normal.      Imaging: Ct Abdomen Wo Contrast  Result Date: 03/15/2018 CLINICAL DATA:  Preop gastrostomy EXAM: CT ABDOMEN WITHOUT CONTRAST TECHNIQUE: Multidetector CT imaging of the abdomen was performed following the standard protocol without IV contrast. COMPARISON:  PET-CT 01/06/2018 FINDINGS: Lower chest: Bilateral posterior lower lobe consolidation left greater than right is compatible with aspiration pneumonia. Centrilobular nodules are scattered throughout the remainder of the visualized lung bases. Hepatobiliary: Unremarkable Pancreas: Unremarkable Spleen: Unremarkable Adrenals/Urinary Tract: Unremarkable Stomach/Bowel: The stomach is well positioned against the anterior abdominal wall. There  is no disproportionate dilatation of bowel to suggest obstruction. Vascular/Lymphatic: Atherosclerotic vascular calcifications in the aorta and iliac arteries. No obvious abnormal retroperitoneal adenopathy. Other: No free fluid. Musculoskeletal: No vertebral compression deformity. IMPRESSION: There is favorable anatomy for gastrostomy tube placement. Bilateral lower lobe consolidation is worrisome for aspiration pneumonia. Electronically Signed   By: Marybelle Killings M.D.   On: 03/15/2018 07:24   Dg Chest 2 View  Result Date: 03/10/2018 CLINICAL DATA:  Follow-up pneumonia. EXAM: CHEST - 2 VIEW COMPARISON:  02/03/2018 FINDINGS: Heart size is normal. Aortic atherosclerosis. Patchy bilateral lower lobe pneumonia, worsened when compared to the study of November. No effusion. Upper lobes remain clear. IMPRESSION: Worsening of bilateral lower lobe pneumonia compared to the study of 02/03/2018. Electronically Signed   By: Nelson Chimes M.D.   On: 03/10/2018 16:06   Ct Soft Tissue Neck W Contrast  Result Date: 03/10/2018 CLINICAL DATA:  58 y/o M; history of throat cancer presenting with hoarseness. Concern for recurrent tumor. EXAM: CT NECK WITH CONTRAST TECHNIQUE: Multidetector CT imaging of the neck was performed using  the standard protocol following the bolus administration of intravenous contrast. CONTRAST:  183mL ISOVUE-370 IOPAMIDOL (ISOVUE-370) INJECTION 76% COMPARISON:  02/01/2018, 08/25/2017, 11/03/2016 CT neck. 01/06/2018 PET-CT. FINDINGS: Pharynx and larynx: Low-attenuation thickening of supraglottic mucosa, aryepiglottic folds, epiglottis. Linear non masslike enhancement of superficial laryngeal mucosa. No discrete mass lesion identified. Patent airway. Salivary glands: No inflammation, mass, or stone. Thyroid: Normal. Lymph nodes: Stable right supraclavicular lymph node measuring 15 x 9 mm (series 2, image 79). Additional cervical lymph nodes are subcentimeter and stable. Vascular: Mixed plaque of the bilateral carotid bifurcations. Mild left proximal ICA stenosis. Limited intracranial: Negative. Visualized orbits: Negative. Mastoids and visualized paranasal sinuses: Clear. Skeleton: Advanced multilevel cervical spondylosis with moderate to severe spinal canal stenosis at the C5-6 level. Uncovertebral and facet hypertrophy encroaches on neural foramen throughout the cervical spine. Upper chest: Waxing and waning clustered ground-glass nodules in bronchovascular distribution in the right upper lobe likely related to bronchiolitis. Moderate emphysema. Other: None. IMPRESSION: 1. Linear non masslike enhancement of superficial supraglottic mucosa as well as low-attenuation mucosal thickening of supraglottic mucosa and epiglottis is stable from prior CT of the neck. Findings likely represent posttreatment changes. No discrete mass identified. Patent airway. 2. Stable cervical lymph nodes. 3. 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 4. Waxing and waning ground-glass nodules in right upper lobe, likely minimal bronchiolitis. 5. Emphysema. Electronically Signed   By: Kristine Garbe M.D.   On: 03/10/2018 16:44   Ct Angio Chest Pe W And/or Wo  Contrast  Result Date: 03/10/2018 CLINICAL DATA:  [place]. Recent diagnosis of pneumonia. History of head and neck cancer. EXAM: CT ANGIOGRAPHY CHEST WITH CONTRAST TECHNIQUE: Multidetector CT imaging of the chest was performed using the standard protocol during bolus administration of intravenous contrast. Multiplanar CT image reconstructions and MIPs were obtained to evaluate the vascular anatomy. CONTRAST:  187mL ISOVUE-370 IOPAMIDOL (ISOVUE-370) INJECTION 76% COMPARISON:  Chest radiograph March 10, 2018 and CT chest January 31, 2018 FINDINGS: CARDIOVASCULAR: Adequate contrast opacification of the pulmonary artery's. Main pulmonary artery is not enlarged. No pulmonary arterial filling defects to the level of the subsegmental branches. Heart size is normal, no right heart strain. No pericardial effusion. Thoracic aorta is normal course and caliber, mild calcific atherosclerosis. MEDIASTINUM/NODES: No lymphadenopathy by CT size criteria. LUNGS/PLEURA: Tracheobronchial tree is patent, no pneumothorax. Worsening tree-in-bud infiltrates and multifocal consolidation with a basilar predominance.  No discrete pulmonary nodule in LEFT lower lobe. No pleural effusion. Moderate centrilobular emphysema with increased lung volumes, flattened hemidiaphragms. UPPER ABDOMEN: Non-acute. MUSCULOSKELETAL: Non-acute. Review of the MIP images confirms the above findings. IMPRESSION: 1. Worsening tree-in-bud infiltrates and focal consolidation consistent with multifocal pneumonia, given basilar predominance, aspiration is possible. Aortic Atherosclerosis (ICD10-I70.0) and Emphysema (ICD10-J43.9). Electronically Signed   By: Elon Alas M.D.   On: 03/10/2018 16:43   Dg Swallowing Func-speech Pathology  Result Date: 03/12/2018 Objective Swallowing Evaluation: Type of Study: MBS-Modified Barium Swallow Study  Patient Details Name: TAYSHAUN KROH MRN: [id number] Date of Birth: 1960-08-07 Today's Date: 03/12/2018 Time: SLP  Start Time (ACUTE ONLY): 2 -SLP Stop Time (ACUTE ONLY): 1200 SLP Time Calculation (min) (ACUTE ONLY): 30 min Past Medical History: Past Medical History: Diagnosis Date . Hypertension  Past Surgical History: Past Surgical History: Procedure Laterality Date . BACK SURGERY   . FOOT SURGERY   HPI: Mr. 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 Chest CT 12/18: "Worsening tree-in-bud infiltrates and focal consolidation consistent with multifocal pneumonia, given basilar predominance, aspiration is possible."  Pt reports being placed on thickened liquid during admission to [place], but could not recall consistency.  Pt reports most recent swallow assessment by FEES 3-4 months previously with no abnormal findings.  Subjective: alert, participatory Assessment / Plan / Recommendation Pt presents with a chronic pharyngeal dysphagia that appears to be deteriorating per pt's account.  There are areas of thickening/abnormal soft tissue of pharyngeal wall, epiglottis, and periarytenoids - this can be visualized when coated with barium.  Pt demonstrates reduced velopharyngeal approximation. He is unable to achieve laryngeal vestibular closure, which leads to consistent penetration of all boluses into the larynx.  A portion of this material passes through vocal folds and into trachea.  Aspiration elicits a cough.  He is able to eject a portion back into pharynx which passes through UES.  There is aspiration of all consistences - liquid and solid - either before or after the swallow event.  He attempted multiple postural adjustments and strategies to improve airway protection (head tilts and turns left and right, chin tuck, supraglottic swallow) -  these were not effective. He expectorates  residuals from pharynx.  Pt's voice is generally aphonic with some ability to achieve voice with effort.     We discussed the MBS and reviewed the video in real time and afterward in his room.  Pt wants to continue eating; he verbalizes understanding that a percentage of all that he eats/drinks is aspirated; he is willing to consider a PEG again for improved nutrition.  Discussed results with Dr. Rebeca Alert. Deterioration in swallow function can be attributable to late effects of radiation/fibrosis, but pt needs to be seen either by his primary ENT or an ENT here for further w/u, repeat laryngoscopy given anatomy and voice changes.  Continue soft diet, nectar-thick liquids for now (nectars tolerated marginally better than thin liquids).  SLP will follow for plan, further education with pt.    CHL IP TREATMENT RECOMMENDATION 03/12/2018 Treatment Recommendations Defer until completion of intrumental exam;Therapy as outlined in treatment plan below   Prognosis 03/12/2018 Prognosis for Safe Diet Advancement Guarded Barriers to Reach Goals -- Barriers/Prognosis Comment -- CHL IP DIET RECOMMENDATION 03/12/2018 SLP Diet Recommendations Dysphagia 2 (  Fine chop) solids;Nectar thick liquid Liquid Administration via Cup Medication Administration Crushed with puree Compensations Small sips/bites;Clear throat intermittently Postural Changes Remain semi-upright after after feeds/meals (Comment)   CHL IP OTHER RECOMMENDATIONS 03/12/2018 Recommended Consults Consider ENT evaluation Oral Care Recommendations Oral care BID Other Recommendations Order thickener from pharmacy   CHL IP FOLLOW UP RECOMMENDATIONS 03/12/2018 Follow up Recommendations Other (comment)   CHL IP FREQUENCY AND DURATION 03/12/2018 Speech Therapy Frequency (ACUTE ONLY) min 2x/week Treatment Duration 1 week      CHL IP ORAL PHASE 03/12/2018 Oral Phase WFL Oral - Pudding Teaspoon -- Oral - Pudding Cup -- Oral - Honey Teaspoon -- Oral - Honey Cup -- Oral - Nectar  Teaspoon -- Oral - Nectar Cup -- Oral - Nectar Straw -- Oral - Thin Teaspoon -- Oral - Thin Cup -- Oral - Thin Straw -- Oral - Puree -- Oral - Mech Soft -- Oral - Regular -- Oral - Multi-Consistency -- Oral - Pill -- Oral Phase - Comment --  CHL IP PHARYNGEAL PHASE 03/12/2018 Pharyngeal Phase Impaired Pharyngeal- Pudding Teaspoon -- Pharyngeal -- Pharyngeal- Pudding Cup -- Pharyngeal -- Pharyngeal- Honey Teaspoon -- Pharyngeal -- Pharyngeal- Honey Cup -- Pharyngeal -- Pharyngeal- Nectar Teaspoon Delayed swallow initiation-pyriform sinuses;Reduced pharyngeal peristalsis;Reduced epiglottic inversion;Reduced anterior laryngeal mobility;Reduced airway/laryngeal closure;Penetration/Aspiration before swallow;Penetration/Apiration after swallow;Pharyngeal residue - pyriform;Trace aspiration Pharyngeal Material enters airway, passes BELOW cords and not ejected out despite cough attempt by patient Pharyngeal- Nectar Cup Delayed swallow initiation-pyriform sinuses;Reduced pharyngeal peristalsis;Reduced epiglottic inversion;Reduced anterior laryngeal mobility;Reduced airway/laryngeal closure;Penetration/Aspiration before swallow;Penetration/Apiration after swallow;Trace aspiration;Pharyngeal residue - pyriform;Pharyngeal residue - valleculae Pharyngeal Material enters airway, passes BELOW cords and not ejected out despite cough attempt by patient Pharyngeal- Nectar Straw -- Pharyngeal -- Pharyngeal- Thin Teaspoon Delayed swallow initiation-pyriform sinuses;Reduced pharyngeal peristalsis;Reduced epiglottic inversion;Reduced anterior laryngeal mobility;Reduced airway/laryngeal closure;Penetration/Aspiration before swallow;Penetration/Apiration after swallow;Trace aspiration Pharyngeal Material enters airway, passes BELOW cords and not ejected out despite cough attempt by patient Pharyngeal- Thin Cup Delayed swallow initiation-pyriform sinuses;Reduced pharyngeal peristalsis;Reduced epiglottic inversion;Reduced anterior laryngeal  mobility;Reduced airway/laryngeal closure;Penetration/Aspiration before swallow;Penetration/Apiration after swallow;Trace aspiration Pharyngeal Material enters airway, passes BELOW cords and not ejected out despite cough attempt by patient Pharyngeal- Thin Straw -- Pharyngeal -- Pharyngeal- Puree Delayed swallow initiation-pyriform sinuses;Reduced pharyngeal peristalsis;Reduced epiglottic inversion;Reduced anterior laryngeal mobility;Reduced airway/laryngeal closure;Penetration/Apiration after swallow;Penetration/Aspiration before swallow;Pharyngeal residue - pyriform;Pharyngeal residue - valleculae Pharyngeal Material enters airway, passes BELOW cords and not ejected out despite cough attempt by patient Pharyngeal- Mechanical Soft -- Pharyngeal -- Pharyngeal- Regular -- Pharyngeal -- Pharyngeal- Multi-consistency -- Pharyngeal -- Pharyngeal- Pill -- Pharyngeal -- Pharyngeal Comment --  CHL IP CERVICAL ESOPHAGEAL PHASE 03/12/2018 Cervical Esophageal Phase (No Data) Pudding Teaspoon -- Pudding Cup -- Honey Teaspoon -- Honey Cup -- Nectar Teaspoon -- Nectar Cup -- Nectar Straw -- Thin Teaspoon -- Thin Cup -- Thin Straw -- Puree -- Mechanical Soft -- Regular -- Multi-consistency -- Pill -- Cervical Esophageal Comment -- [name] 03/12/2018, 2:39 PM              US Abdomen Limited Ruq  Result Date: 03/11/2018 CLINICAL DATA:  Elevated LFT EXAM: ULTRASOUND ABDOMEN LIMITED RIGHT UPPER QUADRANT COMPARISON:  PET-CT 01/06/2017 FINDINGS: Gallbladder: No gallstones or wall thickening visualized. No sonographic Murphy sign noted by sonographer. Common bile duct: Diameter: Dilated up to 13.2 mm. Liver: No focal lesion identified. Within normal limits in parenchymal echogenicity. Portal vein is patent on color Doppler imaging with normal direction of blood flow towards the liver. Trace free fluid adjacent to the liver.  Incidental note made of small right pleural effusion IMPRESSION: 1. Negative for gallstones.  2. Dilated common bile duct up to 13.2 mm. Further evaluation with MRCP could be obtained as indicated. 3. Trace free fluid in the right upper quadrant and small right pleural effusion. Electronically Signed   By: Donavan Foil M.D.   On: 03/11/2018 18:25    Labs:  CBC: Recent Labs    03/12/18 0355 03/13/18 0237 03/15/18 0335 03/25/18 0924  WBC 9.2 8.2 5.2 8.0  HGB 9.9* 9.8* 9.5* 12.1*  HCT 32.1* 31.5* 30.8* 40.6  PLT 193 200 197 405*    COAGS: Recent Labs    03/11/18 1435 03/25/18 0924  INR 1.41 1.06    BMP: Recent Labs    03/13/18 0237 03/14/18 0350 03/15/18 0335 03/25/18 0924  NA 134* 135 134* 136  K 4.1 4.1 4.1 3.9  CL 91* 90* 89* 94*  CO2 35* 36* 38* 36*  GLUCOSE 72 68* 79 95  BUN 8 7 <5* <5*  CALCIUM 7.9* 8.1* 8.0* 8.9  CREATININE 0.42* 0.50* 0.44* 0.55*  GFRNONAA >60 >60 >60 >60  GFRAA >60 >60 >60 >60    LIVER FUNCTION TESTS: Recent Labs    03/12/18 0355 03/13/18 0237 03/14/18 0350 03/15/18 0335  BILITOT 0.7 0.4 0.6 0.3  AST 255* 181* 85* 41  ALT 249* 215* 156* 111*  ALKPHOS 43 42 40 37*  PROT 5.5* 5.7* 5.9* 5.6*  ALBUMIN 1.9* 1.9* 1.9* 2.0*    TUMOR MARKERS: No results for input(s): AFPTM, CEA, CA199, CHROMGRNA in the last 8760 hours.  Assessment and Plan:  Throat [facility]- Dr Bobby Rumpf Last tx July 2019 Was in house [facility] 12/18-12/23/19 for PNA; constriction pericarditis Esophageal dysmotility Tentatively scheduled for G tube 12/23-- but on Plavix Now scheduled as OP-- off Plavix since 12/23 Risks and benefits discussed with the patient including, but not limited to the need for a barium enema during the procedure, bleeding, infection, peritonitis, or damage to adjacent structures.  All of the patient's questions were answered, patient is agreeable to proceed. Consent signed and in chart.  Thank you for this interesting consult.  I greatly enjoyed meeting Apostolos M Debes and look forward to participating in their care.  A  copy of this report was sent to the requesting provider on this date.  Electronically Signed: Lavonia Drafts, PA-C 03/25/2018, 10:36 AM   I spent a total of  40 Minutes   in face to face in clinical consultation, greater than 50% of which was counseling/coordinating care for percutaneous gastric tube placement

## 2018-04-04 ENCOUNTER — Inpatient Hospital Stay (HOSPITAL_COMMUNITY)
Admission: EM | Admit: 2018-04-04 | Discharge: 2018-04-12 | DRG: 177 | Disposition: A | Discharge status: Home / Self Care | Payer: Medicaid Other | Attending: Oncology | Admitting: Oncology

## 2018-04-04 ENCOUNTER — Encounter (HOSPITAL_COMMUNITY): Payer: Self-pay | Admitting: *Deleted

## 2018-04-04 ENCOUNTER — Emergency Department (HOSPITAL_COMMUNITY): Payer: Medicaid Other

## 2018-04-04 ENCOUNTER — Other Ambulatory Visit: Payer: Self-pay

## 2018-04-04 DIAGNOSIS — J9621 Acute and chronic respiratory failure with hypoxia: Secondary | ICD-10-CM | POA: Diagnosis present

## 2018-04-04 DIAGNOSIS — C329 Malignant neoplasm of larynx, unspecified: Secondary | ICD-10-CM | POA: Diagnosis not present

## 2018-04-04 DIAGNOSIS — Z79899 Other long term (current) drug therapy: Secondary | ICD-10-CM

## 2018-04-04 DIAGNOSIS — R1313 Dysphagia, pharyngeal phase: Secondary | ICD-10-CM | POA: Diagnosis present

## 2018-04-04 DIAGNOSIS — G8929 Other chronic pain: Secondary | ICD-10-CM | POA: Diagnosis present

## 2018-04-04 DIAGNOSIS — Z8501 Personal history of malignant neoplasm of esophagus: Secondary | ICD-10-CM | POA: Diagnosis not present

## 2018-04-04 DIAGNOSIS — M549 Dorsalgia, unspecified: Secondary | ICD-10-CM | POA: Diagnosis present

## 2018-04-04 DIAGNOSIS — K5900 Constipation, unspecified: Secondary | ICD-10-CM | POA: Diagnosis present

## 2018-04-04 DIAGNOSIS — I2510 Atherosclerotic heart disease of native coronary artery without angina pectoris: Secondary | ICD-10-CM | POA: Diagnosis not present

## 2018-04-04 DIAGNOSIS — F1190 Opioid use, unspecified, uncomplicated: Secondary | ICD-10-CM | POA: Diagnosis present

## 2018-04-04 DIAGNOSIS — J449 Chronic obstructive pulmonary disease, unspecified: Secondary | ICD-10-CM | POA: Diagnosis present

## 2018-04-04 DIAGNOSIS — E43 Unspecified severe protein-calorie malnutrition: Secondary | ICD-10-CM | POA: Diagnosis present

## 2018-04-04 DIAGNOSIS — C131 Malignant neoplasm of aryepiglottic fold, hypopharyngeal aspect: Secondary | ICD-10-CM | POA: Diagnosis not present

## 2018-04-04 DIAGNOSIS — E871 Hypo-osmolality and hyponatremia: Secondary | ICD-10-CM | POA: Diagnosis present

## 2018-04-04 DIAGNOSIS — Z9981 Dependence on supplemental oxygen: Secondary | ICD-10-CM

## 2018-04-04 DIAGNOSIS — F17210 Nicotine dependence, cigarettes, uncomplicated: Secondary | ICD-10-CM

## 2018-04-04 DIAGNOSIS — Z931 Gastrostomy status: Secondary | ICD-10-CM | POA: Diagnosis not present

## 2018-04-04 DIAGNOSIS — Z79891 Long term (current) use of opiate analgesic: Secondary | ICD-10-CM | POA: Diagnosis not present

## 2018-04-04 DIAGNOSIS — Z681 Body mass index (BMI) 19 or less, adult: Secondary | ICD-10-CM

## 2018-04-04 DIAGNOSIS — R1310 Dysphagia, unspecified: Secondary | ICD-10-CM

## 2018-04-04 DIAGNOSIS — Z85819 Personal history of malignant neoplasm of unspecified site of lip, oral cavity, and pharynx: Secondary | ICD-10-CM

## 2018-04-04 DIAGNOSIS — Z923 Personal history of irradiation: Secondary | ICD-10-CM

## 2018-04-04 DIAGNOSIS — Y842 Radiological procedure and radiotherapy as the cause of abnormal reaction of the patient, or of later complication, without mention of misadventure at the time of the procedure: Secondary | ICD-10-CM | POA: Diagnosis present

## 2018-04-04 DIAGNOSIS — R64 Cachexia: Secondary | ICD-10-CM | POA: Diagnosis present

## 2018-04-04 DIAGNOSIS — E878 Other disorders of electrolyte and fluid balance, not elsewhere classified: Secondary | ICD-10-CM

## 2018-04-04 DIAGNOSIS — R4702 Dysphasia: Secondary | ICD-10-CM | POA: Diagnosis present

## 2018-04-04 DIAGNOSIS — K224 Dyskinesia of esophagus: Secondary | ICD-10-CM | POA: Diagnosis present

## 2018-04-04 DIAGNOSIS — R590 Localized enlarged lymph nodes: Secondary | ICD-10-CM | POA: Diagnosis present

## 2018-04-04 DIAGNOSIS — Z7982 Long term (current) use of aspirin: Secondary | ICD-10-CM | POA: Diagnosis not present

## 2018-04-04 DIAGNOSIS — J9620 Acute and chronic respiratory failure, unspecified whether with hypoxia or hypercapnia: Secondary | ICD-10-CM | POA: Diagnosis present

## 2018-04-04 DIAGNOSIS — M47814 Spondylosis without myelopathy or radiculopathy, thoracic region: Secondary | ICD-10-CM | POA: Diagnosis present

## 2018-04-04 DIAGNOSIS — Z66 Do not resuscitate: Secondary | ICD-10-CM | POA: Diagnosis present

## 2018-04-04 DIAGNOSIS — R109 Unspecified abdominal pain: Secondary | ICD-10-CM | POA: Diagnosis present

## 2018-04-04 DIAGNOSIS — J690 Pneumonitis due to inhalation of food and vomit: Principal | ICD-10-CM | POA: Diagnosis present

## 2018-04-04 DIAGNOSIS — E46 Unspecified protein-calorie malnutrition: Secondary | ICD-10-CM | POA: Diagnosis not present

## 2018-04-04 DIAGNOSIS — I10 Essential (primary) hypertension: Secondary | ICD-10-CM | POA: Diagnosis present

## 2018-04-04 DIAGNOSIS — Z431 Encounter for attention to gastrostomy: Secondary | ICD-10-CM

## 2018-04-04 DIAGNOSIS — Z9621 Cochlear implant status: Secondary | ICD-10-CM | POA: Diagnosis not present

## 2018-04-04 DIAGNOSIS — Z8701 Personal history of pneumonia (recurrent): Secondary | ICD-10-CM | POA: Diagnosis not present

## 2018-04-04 DIAGNOSIS — C321 Malignant neoplasm of supraglottis: Secondary | ICD-10-CM | POA: Diagnosis not present

## 2018-04-04 DIAGNOSIS — R918 Other nonspecific abnormal finding of lung field: Secondary | ICD-10-CM | POA: Diagnosis not present

## 2018-04-04 LAB — CBC WITH DIFFERENTIAL/PLATELET
Abs Immature Granulocytes: 0.07 10*3/uL (ref 0.00–0.07)
Basophils Absolute: 0 10*3/uL (ref 0.0–0.1)
Basophils Relative: 0 %
Eosinophils Absolute: 0 10*3/uL (ref 0.0–0.5)
Eosinophils Relative: 0 %
HCT: 35.4 % — ABNORMAL LOW (ref 39.0–52.0)
Hemoglobin: 11.1 g/dL — ABNORMAL LOW (ref 13.0–17.0)
Immature Granulocytes: 1 %
Lymphocytes Relative: 3 %
Lymphs Abs: 0.3 10*3/uL — ABNORMAL LOW (ref 0.7–4.0)
MCH: 29.8 pg (ref 26.0–34.0)
MCHC: 31.4 g/dL (ref 30.0–36.0)
MCV: 94.9 fL (ref 80.0–100.0)
MONO ABS: 0.7 10*3/uL (ref 0.1–1.0)
Monocytes Relative: 6 %
Neutro Abs: 10.7 10*3/uL — ABNORMAL HIGH (ref 1.7–7.7)
Neutrophils Relative %: 90 %
Platelets: 248 10*3/uL (ref 150–400)
RBC: 3.73 MIL/uL — ABNORMAL LOW (ref 4.22–5.81)
RDW: 12.4 % (ref 11.5–15.5)
WBC: 11.8 10*3/uL — ABNORMAL HIGH (ref 4.0–10.5)
nRBC: 0 % (ref 0.0–0.2)

## 2018-04-04 LAB — COMPREHENSIVE METABOLIC PANEL
ALT: 8 U/L (ref 0–44)
AST: 13 U/L — ABNORMAL LOW (ref 15–41)
Albumin: 2.5 g/dL — ABNORMAL LOW (ref 3.5–5.0)
Alkaline Phosphatase: 52 U/L (ref 38–126)
Anion gap: 8 (ref 5–15)
BUN: 9 mg/dL (ref 6–20)
CO2: 36 mmol/L — ABNORMAL HIGH (ref 22–32)
Calcium: 8.8 mg/dL — ABNORMAL LOW (ref 8.9–10.3)
Chloride: 84 mmol/L — ABNORMAL LOW (ref 98–111)
Creatinine, Ser: 0.5 mg/dL — ABNORMAL LOW (ref 0.61–1.24)
GFR calc Af Amer: >60 mL/min (ref >60)
GFR calc non Af Amer: >60 mL/min (ref >60)
Glucose, Bld: 110 mg/dL — ABNORMAL HIGH (ref 70–99)
Potassium: 4.1 mmol/L (ref 3.5–5.1)
Sodium: 128 mmol/L — ABNORMAL LOW (ref 135–145)
Total Bilirubin: 0.9 mg/dL (ref 0.3–1.2)
Total Protein: 7.3 g/dL (ref 6.5–8.1)

## 2018-04-04 LAB — INFLUENZA PANEL BY PCR (TYPE A & B)
Influenza A By PCR: NEGATIVE
Influenza B By PCR: NEGATIVE

## 2018-04-04 LAB — I-STAT CG4 LACTIC ACID, ED: Lactic Acid, Venous: 1.01 mmol/L (ref 0.5–1.9)

## 2018-04-04 MED ORDER — SODIUM CHLORIDE 0.9 % IV SOLN
2.0000 g | Freq: Once | INTRAVENOUS | Status: AC
  Administered 2018-04-04: 2 g via INTRAVENOUS
  Filled 2018-04-04: qty 2

## 2018-04-04 MED ORDER — ACETAMINOPHEN 325 MG PO TABS
650.0000 mg | ORAL_TABLET | Freq: Four times a day (QID) | ORAL | Status: DC | PRN
  Administered 2018-04-07 – 2018-04-10 (×2): 650 mg via ORAL
  Filled 2018-04-04 (×5): qty 2

## 2018-04-04 MED ORDER — UMECLIDINIUM BROMIDE 62.5 MCG/INH IN AEPB
1.0000 | INHALATION_SPRAY | Freq: Every day | RESPIRATORY_TRACT | Status: DC
  Administered 2018-04-04 – 2018-04-12 (×8): 1 via RESPIRATORY_TRACT
  Filled 2018-04-04 (×3): qty 7

## 2018-04-04 MED ORDER — SENNOSIDES-DOCUSATE SODIUM 8.6-50 MG PO TABS: 1.0000 | ORAL_TABLET | Freq: Every evening | ORAL | Status: DC | PRN

## 2018-04-04 MED ORDER — ACETAMINOPHEN 650 MG RE SUPP: 650.0000 mg | Freq: Four times a day (QID) | RECTAL | Status: DC | PRN

## 2018-04-04 MED ORDER — FLUTICASONE PROPIONATE 50 MCG/ACT NA SUSP
1.0000 | Freq: Every day | NASAL | Status: DC
  Administered 2018-04-08 – 2018-04-11 (×4): 1 via NASAL
  Filled 2018-04-04 (×2): qty 16

## 2018-04-04 MED ORDER — SODIUM CHLORIDE 0.9 % IV BOLUS
1000.0000 mL | Freq: Once | INTRAVENOUS | Status: AC
  Administered 2018-04-04: 1000 mL via INTRAVENOUS

## 2018-04-04 MED ORDER — FLUTICASONE-UMECLIDIN-VILANT 100-62.5-25 MCG/INH IN AEPB: 1.0000 | INHALATION_SPRAY | Freq: Every day | RESPIRATORY_TRACT | Status: DC

## 2018-04-04 MED ORDER — FLUTICASONE FUROATE-VILANTEROL 100-25 MCG/INH IN AEPB
1.0000 | INHALATION_SPRAY | Freq: Every day | RESPIRATORY_TRACT | Status: DC
  Administered 2018-04-04 – 2018-04-12 (×8): 1 via RESPIRATORY_TRACT
  Filled 2018-04-04 (×3): qty 28

## 2018-04-04 MED ORDER — METRONIDAZOLE IN NACL 5-0.79 MG/ML-% IV SOLN
500.0000 mg | Freq: Three times a day (TID) | INTRAVENOUS | Status: DC
  Administered 2018-04-04 – 2018-04-05 (×3): 500 mg via INTRAVENOUS
  Filled 2018-04-04 (×3): qty 100

## 2018-04-04 MED ORDER — ENOXAPARIN SODIUM 40 MG/0.4ML ~~LOC~~ SOLN
40.0000 mg | SUBCUTANEOUS | Status: DC
  Administered 2018-04-04 – 2018-04-06 (×3): 40 mg via SUBCUTANEOUS
  Filled 2018-04-04 (×3): qty 0.4

## 2018-04-04 MED ORDER — ENSURE ENLIVE PO LIQD
237.0000 mL | Freq: Three times a day (TID) | ORAL | Status: DC
  Administered 2018-04-04 – 2018-04-11 (×16): 237 mL via ORAL

## 2018-04-04 MED ORDER — SODIUM CHLORIDE 0.9 % IV SOLN
1.0000 g | Freq: Three times a day (TID) | INTRAVENOUS | Status: DC
  Administered 2018-04-04 – 2018-04-05 (×2): 1 g via INTRAVENOUS
  Filled 2018-04-04 (×3): qty 1

## 2018-04-04 MED ORDER — ONDANSETRON HCL 4 MG/2ML IJ SOLN
4.0000 mg | Freq: Four times a day (QID) | INTRAMUSCULAR | Status: DC | PRN
  Administered 2018-04-10: 4 mg via INTRAVENOUS
  Filled 2018-04-04: qty 2

## 2018-04-04 MED ORDER — ONDANSETRON HCL 4 MG PO TABS
4.0000 mg | ORAL_TABLET | Freq: Four times a day (QID) | ORAL | Status: DC | PRN
  Administered 2018-04-07: 4 mg via ORAL
  Filled 2018-04-04: qty 1

## 2018-04-04 MED ORDER — NICOTINE 21 MG/24HR TD PT24
21.0000 mg | MEDICATED_PATCH | Freq: Every day | TRANSDERMAL | Status: DC
  Administered 2018-04-04 – 2018-04-11 (×8): 21 mg via TRANSDERMAL
  Filled 2018-04-04 (×9): qty 1

## 2018-04-04 MED ORDER — POLYETHYLENE GLYCOL 3350 17 G PO PACK
17.0000 g | PACK | Freq: Every day | ORAL | Status: DC
  Administered 2018-04-04 – 2018-04-11 (×3): 17 g via ORAL
  Filled 2018-04-04 (×7): qty 1

## 2018-04-04 MED ORDER — METHADONE HCL 10 MG/ML PO CONC: 115.0000 mg | Freq: Every day | ORAL | Status: DC

## 2018-04-04 MED ORDER — METHADONE HCL 10 MG/ML PO CONC: 115.0000 mg | ORAL | Status: DC

## 2018-04-04 NOTE — Progress Notes (Signed)
Pharmacy Antibiotic Note  Tony Berry is a 58 y.o. male admitted on 04/04/2018 with pneumonia.  Pharmacy has been consulted for Cefepime dosing.  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 Temp 100.8. WBC 11.8. Scr 0.5, LA WNL  Cefepime 1/12>>  Plan: Cefepime 2g IV x 1 in ED then 1g IV q 8hrs  Height: 5\' 6"  (167.6 cm) Weight: 115 lb 1.3 oz (52.2 kg) IBW/kg (Calculated) : 63.8  Temp (24hrs), Avg:100.8 F (38.2 C), Min:100.8 F (38.2 C), Max:100.8 F (38.2 C)  Recent Labs  Lab 04/04/18 0917 04/04/18 0938  WBC 11.8*  --   CREATININE 0.50*  --   LATICACIDVEN  --  1.01    Estimated Creatinine Clearance: 75.2 mL/min (A) (by C-G formula based on SCr of 0.5 mg/dL (L)).    No Known Allergies   Federico Maiorino S. Alford Highland, PharmD, BCPS Clinical Staff Pharmacist Eilene Ghazi [facility] 04/04/2018 10:41 AM

## 2018-04-04 NOTE — Progress Notes (Signed)
Went to bedside to discuss code status with the patient. He does not want to be intubated but otherwise is okay with CPR, ACLS medications, and defibrillation. Also discussed forgoing the speech evaluation and placing him on a dysphagia 2 diet.

## 2018-04-04 NOTE — ED Provider Notes (Signed)
[facility]ERGENCY DEPARTMENT Provider Note   CSN: [id number] Arrival date & time: 04/04/18  4742     History   Chief Complaint Chief Complaint  Patient presents with  . Dysphagia    HPI Tony Berry is a 58 y.o. male.  Patient with history of throat cancer, status post treatment including radiation, history of admission for aspiration pneumonia in 02/2018, home oxygen use as needed -- presents the emergency department with complaint of increasing shortness of breath over the past couple of days.  Patient has had cough as well.  He feels like he has recurrent pneumonia.  Denies fever at home however 100.8 F in the emergency department on arrival.  Patient has had some pain with breathing.  No nausea, vomiting, or diarrhea.  Patient has ongoing dysphasia.  On last hospitalist admission, evaluated by ENT.  Suspect esophageal dysmotility after radiation therapy.  Patient was scheduled to get a PEG tube placed on 03/25/18.  Unfortunately this was unable to be done as patient does not have a primary care doctor.  He states that he has an upcoming appointment to establish care.  No flu shot.      Past Medical History:  Diagnosis Date  . Cancer ([place])    Throat cancer  . Hypertension     Patient Active Problem List   Diagnosis Date Noted  . Acute on chronic respiratory failure ([place]) 03/15/2018  . Carditis   . Troponin level elevated 03/11/2018  . Protein-calorie malnutrition, severe 03/11/2018  . Hyponatremia 03/11/2018  . Hypophosphatemia 03/11/2018  . Transaminitis   . Multifocal pneumonia 03/10/2018    Past Surgical History:  Procedure Laterality Date  . BACK SURGERY    . FOOT SURGERY          Home Medications    Prior to Admission medications   Medication Sig Start Date End Date Taking? Authorizing Provider  aspirin 81 MG chewable tablet Chew 1 tablet (81 mg total) by mouth daily. Please do not take for five days before your gastrostomy tube  placement procedure 03/16/18   Asencion Noble, MD  clopidogrel (PLAVIX) 75 MG tablet Take 1 tablet (75 mg total) by mouth daily. Please do not take for the 5 days before your gastrostomy tube procedure 03/15/18   Asencion Noble, MD  feeding supplement, ENSURE ENLIVE, (ENSURE ENLIVE) LIQD Take 237 mLs by mouth 3 (three) times daily between meals. 03/15/18 04/14/18  Asencion Noble, MD  fluticasone (FLONASE) 50 MCG/ACT nasal spray Place 1 spray into both nostrils daily.    [provider]  Fluticasone-Umeclidin-Vilant (TRELEGY ELLIPTA) 100-62.5-25 MCG/INH AEPB Inhale 1 puff into the lungs daily.    [provider]  Maltodextrin-Xanthan Gum (RESOURCE THICKENUP CLEAR) POWD Take 120 g by mouth as needed. 03/15/18   Asencion Noble, MD  METHADONE HCL PO Take 115 mg by mouth once a week.    [provider]  nicotine (NICODERM CQ - DOSED IN MG/24 HOURS) 21 mg/24hr patch Place 1 patch (21 mg total) onto the skin daily. 03/16/18   Asencion Noble, MD  polyethylene glycol [facility] / Floria Raveling) packet Take 17 g by mouth daily. 03/16/18   Asencion Noble, MD  senna-docusate (SENOKOT-S) 8.6-50 MG tablet Take 1 tablet by mouth at bedtime as needed for mild constipation. 03/15/18   Asencion Noble, MD    Family History History reviewed. No pertinent family history.  Social History Social History   Tobacco Use  . Smoking  status: Former Smoker    Packs/day: 1.00    Years: 30.00    Pack years: 30.00    Types: Cigarettes    Start date: 03/11/1988    Last attempt to quit: 02/25/2018    Years since quitting: 0.1  . Smokeless tobacco: Never Used  . Tobacco comment: patient states he quit 2 weeks ago  Substance Use Topics  . Alcohol use: No  . Drug use: No     Allergies   Patient has no known allergies.   Review of Systems Review of Systems  Constitutional: Positive for fever.  HENT: Positive for voice change (hoarseness unchanged). Negative for  rhinorrhea and sore throat.   Eyes: Negative for redness.  Respiratory: Positive for cough, chest tightness and shortness of breath.   Cardiovascular: Negative for chest pain.  Gastrointestinal: Negative for abdominal pain, diarrhea, nausea and vomiting.  Genitourinary: Negative for dysuria.  Musculoskeletal: Negative for myalgias.  Skin: Negative for rash.  Neurological: Negative for headaches.     Physical Exam Updated Vital Signs BP (!) 148/75 (BP Location: Right Arm)   Pulse 95   Temp (!) 100.8 F (38.2 C) (Oral)   Resp 19   Ht 5\' 6"  (1.676 m)   Wt 52.2 kg   SpO2 91%   BMI 18.57 kg/m   Physical Exam Vitals signs and nursing note reviewed.  Constitutional:      Appearance: He is well-developed.     Comments: Pt is thin.   HENT:     Head: Normocephalic and atraumatic.  Eyes:     General:        Right eye: No discharge.        Left eye: No discharge.     Conjunctiva/sclera: Conjunctivae normal.  Neck:     Musculoskeletal: Normal range of motion and neck supple.  Cardiovascular:     Rate and Rhythm: Normal rate and regular rhythm.     Heart sounds: Normal heart sounds.  Pulmonary:     Effort: Pulmonary effort is normal. No accessory muscle usage or respiratory distress.     Breath sounds: Decreased breath sounds (globally) present. No wheezing or rales.  Abdominal:     Palpations: Abdomen is soft.     Tenderness: There is no abdominal tenderness. There is no guarding or rebound.  Skin:    General: Skin is warm and dry.  Neurological:     Mental Status: He is alert.      ED Treatments / Results  Labs (all labs ordered are listed, but only abnormal results are displayed) Labs Reviewed  COMPREHENSIVE METABOLIC PANEL - Abnormal; Notable for the following components:      Result Value   Sodium 128 (*)    Chloride 84 (*)    CO2 36 (*)    Glucose, Bld 110 (*)    Creatinine, Ser 0.50 (*)    Calcium 8.8 (*)    Albumin 2.5 (*)    AST 13 (*)    All other  components within normal limits  CBC WITH DIFFERENTIAL/PLATELET - Abnormal; Notable for the following components:   WBC 11.8 (*)    RBC 3.73 (*)    Hemoglobin 11.1 (*)    HCT 35.4 (*)    Neutro Abs 10.7 (*)    Lymphs Abs 0.3 (*)    All other components within normal limits  CULTURE, BLOOD (ROUTINE X 2)  CULTURE, BLOOD (ROUTINE X 2)  INFLUENZA PANEL BY PCR (TYPE A & B)  URINALYSIS, ROUTINE  W REFLEX MICROSCOPIC  I-STAT CG4 LACTIC ACID, ED  I-STAT CG4 LACTIC ACID, ED    EKG EKG Interpretation  Date/Time:  Sunday April 04 2018 09:05:04 EST Ventricular Rate:  97 PR Interval:    QRS Duration: 101 QT Interval:  336 QTC Calculation: 427 R Axis:   87 Text Interpretation:  Sinus rhythm Nonspecific T abnormalities, lateral leads Confirmed by Lennice Sites [phone]) on 04/04/2018 9:18:00 AM   Radiology Dg Chest 2 View  Result Date: 04/04/2018 CLINICAL DATA:  Shortness of breath. History of aspiration pneumonia. EXAM: CHEST - 2 VIEW COMPARISON:  Chest CT 03/18/2018; chest radiograph 03/10/2018 FINDINGS: Monitoring leads overlie the patient. Normal cardiac and mediastinal contours. Re demonstrated bilateral mid and lower lung patchy areas of consolidation. No pleural effusion or pneumothorax. Thoracic spine degenerative changes. IMPRESSION: Persistent bilateral mid and lower lung consolidative opacities concerning for aspiration pneumonia. Electronically Signed   By: Lovey Newcomer M.D.   On: 04/04/2018 10:17    Procedures Procedures (including critical care time)  Medications Ordered in ED Medications  sodium chloride 0.9 % bolus 1,000 mL (1,000 mLs Intravenous New Bag/Given 04/04/18 1032)  ceFEPIme (MAXIPIME) 2 g in sodium chloride 0.9 % 100 mL IVPB (has no administration in time range)  metroNIDAZOLE (FLAGYL) IVPB 500 mg (has no administration in time range)  ceFEPIme (MAXIPIME) 1 g in sodium chloride 0.9 % 100 mL IVPB (has no administration in time range)     Initial Impression /  Assessment and Plan / ED Course  I have reviewed the triage vital signs and the nursing notes.  Pertinent labs & imaging results that were available during my care of the patient were reviewed by me and considered in my medical decision making (see chart for details).     Patient seen and examined. Work-up initiated. Pt currently on 3L [place] O2.   Vital signs reviewed and are as follows: BP (!) 148/75 (BP Location: Right Arm)   Pulse 95   Temp (!) 100.8 F (38.2 C) (Oral)   Resp 19   Ht 5\' 6"  (1.676 m)   Wt 52.2 kg   SpO2 91%   BMI 18.57 kg/m    Pt seen by and discussed with Dr. Ronnald Nian.   10:40 AM Pt and son updated. Patient now tells me about an episode of syncope occurring 2 nights ago. States he got up off of the couch to get his oxygen and he 'blacked out'. He feels like he has been having some weakness in his R hand since that time, trouble extending fingers. No obvious weakness on exam at this time.   Will speak with IMTS regarding admission.   10:56 AM Discussed with IMTS who will see patient.    Final Clinical Impressions(s) / ED Diagnoses   Final diagnoses:  Aspiration pneumonia of both lower lobes, unspecified aspiration pneumonia type ([place])  Hyponatremia   Admit.   ED Discharge Orders    None       Carlisle Cater, PA-C 04/04/18 [place], [place], DO 04/04/18 1143

## 2018-04-04 NOTE — ED Provider Notes (Signed)
Medical screening examination/treatment/procedure(s) were conducted as a shared visit with non-physician practitioner(s) and myself.  I personally evaluated the patient during the encounter. Briefly, the patient is a 58 y.o. male with history of hypertension, throat cancer who previously has had a PEG tube presents the ED with difficulty with swallowing, shortness of breath.  Patient recently admitted for aspiration pneumonia.  Finished course of antibiotics.  Patient was unable to get a new PEG tube due to not having a primary care doctor.  Continues to have difficulty swallowing medications, adheres to a thick fluid diet that he states he tolerates well.  Patient has diminished breath sounds throughout.  Patient is on 3 L of oxygen upon arrival.  Febrile to 100.8.  Patient is supposed to be intermittently using oxygen.  Suspect that patient likely with aspiration pneumonia.  Has not had a flu shot.  Possible viral process.  Patient continues to lose weight per family.  Continues to have a hoarse voice.  Patient appears cachectic, chronically ill-appearing.  Will initiate infectious work-up.  Patient does have normal blood pressure.  Low concern for sepsis.  Will re-evaluate after labs but will likely need admission for further infectious care.  Patient with mild leukocytosis, normal lactic acid.  Chronic hyponatremia.  Chest x-ray shows signs of possible aspiration pneumonia.  Will start IV antibiotics.  Patient admitted to resident service for further care.  Remained hemodynamically stable throughout my care.  Flu is negative.  This chart was dictated using voice recognition software.  Despite best efforts to proofread,  errors can occur which can change the documentation meaning.    EKG Interpretation  Date/Time:  Sunday April 04 2018 09:05:04 EST Ventricular Rate:  97 PR Interval:    QRS Duration: 101 QT Interval:  336 QTC Calculation: 427 R Axis:   87 Text Interpretation:  Sinus rhythm  Nonspecific T abnormalities, lateral leads Confirmed by Lennice Sites [phone]) on 04/04/2018 9:18:00 AM          Lennice Sites, DO 04/04/18 1056

## 2018-04-04 NOTE — ED Triage Notes (Addendum)
Per family PT needs a peg tube, Pt has a hard time swallowing meds.

## 2018-04-04 NOTE — H&P (Signed)
Date: 04/04/2018               Patient Name:  Tony Berry MRN: [id number]  DOB: Dec 26, 1960 Age / Sex: 58 y.o., male   PCP: Garnette Czech, MD         Medical Service: Internal Medicine Teaching Service         Attending Physician: Dr. Lucious Groves, DO    First Contact: Dr. Harlow Ohms  Pager: [phone]  Second Contact: Dr. Ina Homes  Pager: [phone]       After Hours (After 5p/  First Contact Pager: [phone]  weekends / holidays): Second Contact Pager: [phone]   Chief Complaint: Cough  History of Present Illness: Tony Berry is a 58 year old male with history of esophageal cancer status post radiation and COPD who presented to the emergency department with difficulty swallowing and shortness of breath. He was recently admitted in December 2019 with aspiration pneumonia. After completing his antibiotics he was discharged home with instructions to follow up with interventional radiology for peg tube placement and establish with a primary care provider. Since discharge he has been doing well until a couple days ago when he developed increasing shortness of breath and cough. He endorses subjective fevers and was found to have a temperature of 100.8 F in the emergency department. He continues to have difficulty with swallowing his medications. He feels that he is having a recurrence of his aspiration pneumonia. There has also been complications with getting the PEG tubes. Son states that he went to have a PEG tube placed last week and was declined because he did not have a PCP. This is upsetting because he is still struggling to swallow medicines and food.   Endorses falls, weight loss, frequent choking (food and medicine). He denies nausea/vomiting, diarrhea. Wondering if they can get the PEG placed while he is here.   In the ED the patient was found to be febrile to 100.8 F and a leukocytosis with left shift. Other labs were significant for chronic hyponatremia and low albumin.  Influenza swabs were negative. Chest x-ray with new right lower lobe infiltrate compared to prior chest x-ray. He was subsequently started on Cefepime.  Meds:  Current Meds  Medication Sig  . aspirin 81 MG chewable tablet Chew 1 tablet (81 mg total) by mouth daily. Please do not take for five days before your gastrostomy tube placement procedure (Patient taking differently: Chew 81 mg by mouth daily. )  . clopidogrel (PLAVIX) 75 MG tablet Take 1 tablet (75 mg total) by mouth daily. Please do not take for the 5 days before your gastrostomy tube procedure (Patient taking differently: Take 75 mg by mouth daily. )  . fluticasone (FLONASE) 50 MCG/ACT nasal spray Place 1 spray into both nostrils daily.  . Fluticasone-Umeclidin-Vilant (TRELEGY ELLIPTA) 100-62.5-25 MCG/INH AEPB Inhale 1 puff into the lungs daily.  . Maltodextrin-Xanthan Gum (RESOURCE THICKENUP CLEAR) POWD Take 120 g by mouth as needed.  . METHADONE HCL PO Take 115 mg by mouth once a week.  . nicotine (NICODERM CQ - DOSED IN MG/24 HOURS) 21 mg/24hr patch Place 1 patch (21 mg total) onto the skin daily.  . polyethylene glycol (MIRALAX / GLYCOLAX) packet Take 17 g by mouth daily. (Patient taking differently: Take 17 g by mouth daily as needed. )  . senna-docusate (SENOKOT-S) 8.6-50 MG tablet Take 1 tablet by mouth at bedtime as needed for mild constipation.  . [DISCONTINUED] levofloxacin (LEVAQUIN) 750 MG tablet Take  750 mg by mouth daily.   Allergies: Allergies as of 04/04/2018  . (No Known Allergies)   Past Medical History:  Diagnosis Date  . Cancer ([place])    Throat cancer  . Hypertension    History reviewed. No pertinent family history.  Social History:  Lives with wife. Denies current use of EtOH, tobacco, illicit substances  Review of Systems: A complete ROS was negative except as per HPI.   Physical Exam: Blood pressure (!) 148/75, pulse 95, temperature (!) 100.8 F (38.2 C), temperature source Oral, resp. rate 19,  height 5\' 6"  (1.676 m), weight 52.2 kg, SpO2 91 %.  General: Malnourished, cachectic male in no acute distress HENT: Normocephalic, atraumatic, moist mucus membranes Pulm: Diminished her movement bilaterally with no obvious wheezing or crackles CV: RRR, no murmurs, no rubs  Abdomen: Active bowel sounds, soft, non-distended, no tenderness to palpation  Extremities: Pulses palpable in all extremities, no LE edema  Skin: Warm and dry  Neuro: Alert and oriented x 3  EKG: personally reviewed my interpretation is sinus tachycardia. Unchanged compared to prior.  CXR: personally reviewed my interpretation is overinflated lungs with right lower lobe infiltrate that is new compared to prior chest x-rays  Assessment & Plan by Problem: Active Problems:   Aspiration pneumonia ([facility])  Tony Berry is a 58 year old male with history of esophageal cancer status post radiation and COPD who presented to the emergency department with difficulty swallowing and shortness of breath. On admission the patient was febrile with a leukocytosis and a new right lower lobe infiltrate seen on chest x-ray. He was subsequently admitted for aspiration pneumonia.  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 Last dose of Plavix on 1/11 in the a.m.  Malnutrition secondary to esophageal dysmotility  - Consult dietician  - Monitor for refeeding syndrome   OUD - Secondary to chronic back pain for which he has had multiple surgeries.  - Continue 115mg  methadone qd  Diet: NPO with speech eval  VTE ppx: Lovenox  CODE STATUS: DNR  Dispo: Admit  patient to Inpatient with expected length of stay greater than 2 midnights.  Signed: Ina Homes, MD 04/04/2018, 12:55 PM

## 2018-04-05 LAB — RENAL FUNCTION PANEL
ANION GAP: 6 (ref 5–15)
Albumin: 2.3 g/dL — ABNORMAL LOW (ref 3.5–5.0)
BUN: 7 mg/dL (ref 6–20)
CO2: 36 mmol/L — ABNORMAL HIGH (ref 22–32)
Calcium: 8.2 mg/dL — ABNORMAL LOW (ref 8.9–10.3)
Chloride: 86 mmol/L — ABNORMAL LOW (ref 98–111)
Creatinine, Ser: 0.41 mg/dL — ABNORMAL LOW (ref 0.61–1.24)
GFR calc Af Amer: >60 mL/min (ref >60)
GFR calc non Af Amer: >60 mL/min (ref >60)
Glucose, Bld: 97 mg/dL (ref 70–99)
Phosphorus: 3.3 mg/dL (ref 2.5–4.6)
Potassium: 3.8 mmol/L (ref 3.5–5.1)
Sodium: 128 mmol/L — ABNORMAL LOW (ref 135–145)

## 2018-04-05 LAB — MAGNESIUM: Magnesium: 1.4 mg/dL — ABNORMAL LOW (ref 1.7–2.4)

## 2018-04-05 MED ORDER — MAGNESIUM SULFATE 4 GM/100ML IV SOLN
4.0000 g | Freq: Once | INTRAVENOUS | Status: AC
  Administered 2018-04-05: 4 g via INTRAVENOUS
  Filled 2018-04-05: qty 100

## 2018-04-05 MED ORDER — METHADONE HCL 10 MG PO TABS
115.0000 mg | ORAL_TABLET | Freq: Every day | ORAL | Status: DC
  Administered 2018-04-05 – 2018-04-06 (×2): 115 mg via ORAL
  Filled 2018-04-05 (×2): qty 12

## 2018-04-05 MED ORDER — PIPERACILLIN-TAZOBACTAM 3.375 G IVPB
3.3750 g | Freq: Three times a day (TID) | INTRAVENOUS | Status: AC
  Administered 2018-04-05 – 2018-04-11 (×18): 3.375 g via INTRAVENOUS
  Filled 2018-04-05 (×18): qty 50

## 2018-04-05 MED ORDER — METHADONE HCL 10 MG/ML PO CONC
115.0000 mg | Freq: Every day | ORAL | Status: DC
  Filled 2018-04-05: qty 11.5

## 2018-04-05 NOTE — Progress Notes (Addendum)
   Subjective: No overnight events. His speech is difficult to understand. His supplemental oxygen was increased by nursing while the team was in the room because his oxygen saturations were below 90. He does not wear oxygen at home.  Objective:  Vital signs in last 24 hours: Vitals:   04/04/18 1417 04/04/18 1456 04/04/18 1738 04/04/18 2257  BP: (!) 146/76  132/67 119/62  Pulse: 77  79 78  Resp:    14  Temp: 98.9 F (37.2 C)  98.9 F (37.2 C) 99 F (37.2 C)  TempSrc: Oral  Oral Oral  SpO2: 92% 92% 97% (!) 85%  Weight:      Height:       Gen: sitting up comfortably in bed, no distress, on 4L [place] HENT: Right-sided neck scar tissue s/p radiation Neuro: cranial nerves II-XII intact, strength 5/5 throughout, normoreflexic Pulm: diminished movement bilaterally, no obvious wheezing  Assessment/Plan:  Active Problems:   Aspiration pneumonia ([facility])  Tony Berry is a 58 year old male with history of esophageal cancer status post radiation and COPD who presented to the emergency department with difficulty swallowing and shortness of breath. On admission the patient was febrile with a leukocytosis and a new right lower lobe infiltrate seen on chest x-ray. He was subsequently admitted for aspiration pneumonia.  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 Last dose of Plavix on 1/11 in the a.m.  Malnutrition secondary to esophageal dysmotility  - Consult dietician  - Monitor for refeeding syndrome  - Mg 1.4, will replete   OUD - Continue 115mg  methadone qd  Dispo: Anticipated discharge pending clinical improvement and  possible PEG tube placement.  Tony Craze, DO 04/05/2018, 6:39 AM Pager: [phone]

## 2018-04-05 NOTE — Progress Notes (Signed)
Pharmacy Antibiotic Note  Tony Berry is a 58 y.o. male admitted on 04/04/2018 with pneumonia.  Pharmacy has been consulted for Zosyn dosing.  Plan: Stop cefepime and Flagyl Start Zosyn 3.375 gm IV q8h (4 hour infusion) Monitor clinical picture, renal function F/U C&S, abx deescalation / LOT  Height: 5\' 6"  (167.6 cm) Weight: 115 lb 1.3 oz (52.2 kg) IBW/kg (Calculated) : 63.8  Temp (24hrs), Avg:98.8 F (37.1 C), Min:98.2 F (36.8 C), Max:99 F (37.2 C)  Recent Labs  Lab 04/04/18 0917 04/04/18 0938 04/05/18 0223  WBC 11.8*  --   --   CREATININE 0.50*  --  0.41*  LATICACIDVEN  --  1.01  --     Estimated Creatinine Clearance: 75.2 mL/min (A) (by C-G formula based on SCr of 0.41 mg/dL (L)).    No Known Allergies  Elenor Quinones, PharmD, BCPS, [facility] Clinical Pharmacist Phone number [phone] 04/05/2018 10:13 AM

## 2018-04-05 NOTE — Progress Notes (Signed)
   04/05/18 1038  Clinical Encounter Type  Visited With Patient  Visit Type Initial;Other (Comment) (AD)  Referral From Nurse  Consult/Referral To Chaplain  The chaplain responded to consult for AD.  The Pt. was awake and sitting up in his chair upon chaplain arrival.  The Pt. was unsure if he had requested information on an AD.  The chaplain reviewed the role of HCPOA and purpose of Living Will with the Pt.  The chaplain heard the Pt. reference his wife and brother in the Pt.'s healthcare decision making.  The Pt. brother will visit the Pt. tonight.  The chaplain left the brochure with the Pt. For discussion with his brother. The chaplain will F/U as needed.

## 2018-04-05 NOTE — Discharge Summary (Addendum)
Name: Tony Berry MRN: [id number] DOB: [birth year] 58 y.o. PCP: Garnette Czech, MD  Date of Admission: 04/04/2018  8:56 AM Date of Discharge: 04/12/2018 Attending Physician: Annia Belt, MD  Discharge Diagnosis: 1. Aspiration Pneumonia 2. Esophageal dysmotility 3. Malnutrition 4. PEG tube placement  Discharge Medications: Allergies as of 04/12/2018   No Known Allergies     Medication List    TAKE these medications   aspirin 81 MG chewable tablet Chew 1 tablet (81 mg total) by mouth daily. Please do not take for five days before your gastrostomy tube placement procedure What changed:  additional instructions   clopidogrel 75 MG tablet Commonly known as:  PLAVIX Take 1 tablet (75 mg total) by mouth daily. Please do not take for the 5 days before your gastrostomy tube procedure What changed:  additional instructions   feeding supplement (ENSURE ENLIVE) Liqd Take 237 mLs by mouth 3 (three) times daily between meals. What changed:  Another medication with the same name was added. Make sure you understand how and when to take each.   feeding supplement Liqd Take 474 mLs by mouth 3 (three) times daily with meals. What changed:  You were already taking a medication with the same name, and this prescription was added. Make sure you understand how and when to take each.   fluticasone 50 MCG/ACT nasal spray Commonly known as:  FLONASE Place 1 spray into both nostrils daily.   free water Soln Place 200 mLs into feeding tube 3 (three) times daily after meals.   METHADONE HCL PO Take 115 mg by mouth daily.   nicotine 21 mg/24hr patch Commonly known as:  NICODERM CQ - dosed in mg/24 hours Place 1 patch (21 mg total) onto the skin daily.   oxyCODONE 5 MG/5ML solution Commonly known as:  ROXICODONE Take 10 mLs (10 mg total) by mouth every 4 (four) hours as needed for up to 5 days for severe pain.   polyethylene glycol packet Commonly known as:  MIRALAX /  GLYCOLAX Take 17 g by mouth daily. What changed:    when to take this  reasons to take this   RESOURCE THICKENUP CLEAR Powd Take 120 g by mouth as needed.   senna-docusate 8.6-50 MG tablet Commonly known as:  Senokot-S Take 1 tablet by mouth at bedtime as needed for mild constipation.   TRELEGY ELLIPTA 100-62.5-25 MCG/INH Aepb Generic drug:  Fluticasone-Umeclidin-Vilant Inhale 1 puff into the lungs daily.            Durable Medical Equipment  (From admission, onward)         Start     Ordered   04/09/18 1603  For home use only DME oxygen  Once    Question Answer Comment  Liters per Minute 3   Frequency Continuous (stationary and portable oxygen unit needed)   Oxygen delivery system Gas      04/09/18 1603   04/09/18 1510  For home use only DME Tube feeding  Once    Comments:  Osmolite 1.2 formula: 2 cartons 3 times daily via G-tube; provides 1710 kcals, 79 gm protein, 1170 ml free water daily; free water flushes at 200 ml 3 times daily to meet fluid needs.  KGMW:1027-2536  Protein:80-95 gm  Fluid:1.6-1.8 L    04/09/18 1511          Disposition and follow-up:   Mr.Cutter M Vandeven was discharged from [facility] in Stable condition.  At the hospital follow up  visit please address:  1.  PEG tube- patient had this placed on 1/16, please make sure he is tolerating feeds.  2.  Labs / imaging needed at time of follow-up: none  3.  Pending labs/ test needing follow-up: none  Follow-up Appointments: [place] Follow up.   Why:  tube feeds supplies for bolus feeds Contact information: [address] [place] [address] [phone]           Hospital Course by problem list: 1. Aspiration Pneumonia/Acute on Chronic Hypoxic Respiratory Failure- Presented to the emergency departmentwith difficulty swallowing and shortness of breath.On admission the patient was febrile with a  leukocytosis and a new right lower lobe infiltrate seen on chest x-ray. He was subsequently admitted for aspiration pneumonia. He was treated with 7 days of zoysn. Blood cultures showed no growth. Patient could not maintain saturations greater than 88% without O2 while ambulating; home oxygen ordered, continuous 3L.   Ordered a repeat CT due to concern for lung nodules. Believe the majority of the changes that we saw on recent CT chest were inflammatory and not neoplastic.  However follow-up CT scan in about 3 months is indicated and should be done either through his radiation oncologist or PCP.  2. 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 He was continued on a dysphagia 2 diet and monitored for refeeding syndrome.  4. PEG tube placement - IR placed a PEG tube on 1/16.  He was started on tube feeds on 1/17 and monitored for refeeding syndrome. No s/sx of refeeding. He was discharged to continue Osmolite 1.2 formula: 2 cartons 3 times daily via G-tube; provides 1710 kcals, 79 gm protein, 1170 ml free water daily; free water flushes at 200 ml 3 times daily to meet fluid needs.   Discharge Vitals:   BP 97/63 (BP Location: Left Arm)   Pulse 75   Temp 97.9 F (36.6 C)   Resp 12   Ht 5\' 6"  (1.676 m)   Wt 49.2 kg   SpO2 95%   BMI 17.51 kg/m   Pertinent Labs, Studies, and Procedures:   CMP Latest Ref Rng & Units 04/12/2018 04/11/2018 04/10/2018  Glucose 70 - 99 mg/dL 76 137(H) 126(H)  BUN 6 - 20 mg/dL 10 7 10   Creatinine 0.61 - 1.24 mg/dL 0.40(L) 0.43(L) 0.49(L)  Sodium 135 - 145 mmol/L 131(L) 132(L) 133(L)  Potassium 3.5 - 5.1 mmol/L 4.8 4.4 4.1  Chloride 98 - 111 mmol/L 87(L) 88(L) 86(L)  CO2 22 - 32 mmol/L 34(H) 37(H) 38(H)  Calcium 8.9 - 10.3 mg/dL 8.6(L) 8.6(L) 8.6(L)  Total Protein 6.5 - 8.1 g/dL - - -  Total Bilirubin 0.3 - 1.2 mg/dL - - -  Alkaline Phos 38 - 126 U/L - - -  AST 15 - 41 U/L - - -  ALT 0 - 44  U/L - - -   Dg Chest 2 View  Result Date: 04/04/2018 CLINICAL DATA:  Shortness of breath. History of aspiration pneumonia. EXAM: CHEST - 2 VIEW COMPARISON:  Chest CT 03/18/2018; chest radiograph 03/10/2018 FINDINGS: Monitoring leads overlie the patient. Normal cardiac and mediastinal contours. Re demonstrated bilateral mid and lower lung patchy areas of consolidation. No pleural effusion or pneumothorax. Thoracic spine degenerative changes. IMPRESSION: Persistent bilateral mid and lower lung consolidative opacities concerning for aspiration pneumonia. Electronically Signed   By: Lovey Newcomer M.D.   On: 04/04/2018 10:17  Discharge Instructions: Discharge Instructions    Diet - low sodium heart healthy   Complete by:  As directed    Increase activity slowly   Complete by:  As directed     Mr. Mroczka,  Please start taking Osmolite 1.2 formula: 2 cartons 3 times daily via your PEG tube. I also want you to complete free water flushes at 200 ml 3 times daily to meet fluid needs. I have ordered you home oxygen and a home health RN to help assist you with all of this.  You can also continue to take oxycodone 10 ml every four hours for your pain for five days. You can alternate this with ibuprofen as needed.  Please follow up with your PCP in one week.  Signed: [name], DO 04/12/2018, 8:23 AM

## 2018-04-05 NOTE — Evaluation (Addendum)
Occupational Therapy Evaluation Patient Details Name: Tony Berry MRN: [id number] DOB: [birth year] Today's Date: 04/05/2018    History of Present Illness 58 year old male with history of esophageal cancer status post radiation and COPD who presented to the emergency department with difficulty swallowing and shortness of breath. On admission the patient was febrile with a leukocytosis and a new right lower lobe infiltrate seen on chest x-ray. He was subsequently admitted for aspiration pneumonia.   Clinical Impression   This 58 y/o male presents with the above. At baseline pt reports he is independent with ADL and functional mobility. Pt performing room level functional mobility without AD and overall minguard assist this session. He currently requires setup assist for UB ADL, minguard assist for LB ADL. Pt on 4L O2 during session with sats maintaining at 93% and above with room level activity. Initiated education regarding energy conservation and activity progression after return home with pt verbalizing understanding. Pt will benefit from continued OT services while in acute setting to maximize his overall safety and independence with ADL and mobility after discharge home. Will follow.     Follow Up Recommendations  No OT follow up;Supervision - Intermittent    Equipment Recommendations  None recommended by OT           Precautions / Restrictions Precautions Precautions: Fall;Other (comment) Precaution Comments: watch sats Restrictions Weight Bearing Restrictions: No      Mobility Bed Mobility               General bed mobility comments: seated EOB upon arrival  Transfers Overall transfer level: Needs assistance Equipment used: None Transfers: Sit to/from Stand Sit to Stand: Min guard         General transfer comment: minguard for safety and initial standing balance     Balance Overall balance assessment: Needs assistance;History of Falls Sitting-balance  support: No upper extremity supported;Feet supported Sitting balance-Leahy Scale: Good     Standing balance support: During functional activity;No upper extremity supported Standing balance-Leahy Scale: Fair Standing balance comment: minguard for standing balance                           ADL either performed or assessed with clinical judgement   ADL Overall ADL's : Needs assistance/impaired Eating/Feeding: Independent;Sitting   Grooming: Min guard;Standing   Upper Body Bathing: Set up;Sitting   Lower Body Bathing: Min guard;Sit to/from stand   Upper Body Dressing : Set up;Sitting   Lower Body Dressing: Min guard;Sit to/from stand Lower Body Dressing Details (indicate cue type and reason): pt performing figure 4 technique for sock management Toilet Transfer: Min guard;Supervision/safety;Ambulation Toilet Transfer Details (indicate cue type and reason): simulated via in room mobility, transfer to/from [place] and Hygiene: Min guard;Sit to/from stand       Functional mobility during ADLs: Min guard General ADL Comments: initated education regarding energy conservation techniques and activity progression after return home     Vision         Perception     Praxis      Pertinent Vitals/Pain Pain Assessment: No/denies pain     Hand Dominance Right   Extremity/Trunk Assessment Upper Extremity Assessment Upper Extremity Assessment: Overall WFL for tasks assessed;Generalized weakness   Lower Extremity Assessment Lower Extremity Assessment: Defer to PT evaluation   Cervical / Trunk Assessment Cervical / Trunk Assessment: Normal   Communication Communication Communication: Expressive difficulties(pt with esophageal CA)   Cognition Arousal/Alertness: Awake/alert Behavior During  Therapy: Flat affect Overall Cognitive Status: Within Functional Limits for tasks assessed                                 General  Comments: pt with low, mumbled speech which is difficult to understand at times, cognitive status overall appears [facility]    General Comments  pt on 4L O2 during session with SpO2 maintaining above 93% with room level activity     Exercises     Shoulder Instructions      Home Living Family/patient expects to be discharged to:: Private residence Living Arrangements: Spouse/significant other Available Help at Discharge: Family;Available 24 hours/day Type of Home: Mobile home Home Access: Stairs to enter Entrance Stairs-Number of Steps: 4 Entrance Stairs-Rails: Can reach both;Right;Left Home Layout: One level     Bathroom Shower/Tub: Teacher, early years/pre: Standard     Home Equipment: Environmental consultant - 2 wheels;Cane - single point;Walker - 4 wheels;Shower seat;Bedside commode          Prior Functioning/Environment Level of Independence: Independent                 OT Problem List: Decreased strength;Decreased activity tolerance;Cardiopulmonary status limiting activity      OT Treatment/Interventions: Self-care/ADL training;Therapeutic exercise;Therapeutic activities;Patient/family education;Balance training;Energy conservation    OT Goals(Current goals can be found in the care plan section) Acute Rehab OT Goals Patient Stated Goal: home OT Goal Formulation: With patient Time For Goal Achievement: 04/19/18 Potential to Achieve Goals: Good  OT Frequency: Min 2X/week   Barriers to D/C:            Co-evaluation              AM-PAC OT "6 Clicks" Daily Activity     Outcome Measure Help from another person eating meals?: None Help from another person taking care of personal grooming?: None Help from another person toileting, which includes using toliet, bedpan, or urinal?: A Little Help from another person bathing (including washing, rinsing, drying)?: None Help from another person to put on and taking off regular upper body clothing?: None Help from  another person to put on and taking off regular lower body clothing?: A Little 6 Click Score: 22   End of Session Equipment Utilized During Treatment: Oxygen Nurse Communication: Mobility status  Activity Tolerance: Patient tolerated treatment well Patient left: with call bell/phone within reach;Other (comment)(seated EOB)  OT Visit Diagnosis: Muscle weakness (generalized) (M62.81)                Time: 1355-1410 OT Time Calculation (min): 15 min Charges:  OT General Charges $OT Visit: 1 Visit OT Evaluation $OT Eval Moderate Complexity: [place], OT [employer] Pager [phone] Office [place] 04/05/2018, 3:57 PM

## 2018-04-05 NOTE — Evaluation (Signed)
Physical Therapy Evaluation Patient Details Name: Tony Berry MRN: [id number] DOB: 1960-10-12 Today's Date: 04/05/2018   History of Present Illness  58 year old male with history of esophageal cancer status post radiation and COPD who presented to the emergency department with difficulty swallowing and shortness of breath. On admission the patient was febrile with a leukocytosis and a new right lower lobe infiltrate seen on chest x-ray. He was subsequently admitted for aspiration pneumonia.    Clinical Impression  Pt admitted with above diagnosis. Pt currently with functional limitations due to the deficits listed below (see PT Problem List). On eval, pt required min guard assist transfers and ambulation in room 25 feet. He declined hallway ambulation. Pt on 4 L O2 throughout session with SpO2 84%.  He is currently awaiting PEG placement. Pt will benefit from skilled PT to increase their independence and safety with mobility to allow discharge to the venue listed below.  PT to follow acutely. No follow up services indicated.     Follow Up Recommendations No PT follow up;Supervision for mobility/OOB    Equipment Recommendations  None recommended by PT    Recommendations for Other Services       Precautions / Restrictions Precautions Precautions: Fall;Other (comment) Precaution Comments: watch sats      Mobility  Bed Mobility Overal bed mobility: Modified Independent                Transfers Overall transfer level: Needs assistance Equipment used: None Transfers: Sit to/from Stand Sit to Stand: Min guard         General transfer comment: min guard for safety, increased time to stabilize initial standing balance  Ambulation/Gait Ambulation/Gait assistance: Min guard Gait Distance (Feet): 25 Feet Assistive device: None Gait Pattern/deviations: Step-through pattern;Decreased stride length Gait velocity: decreased   General Gait Details: Pt declining hallway  ambulation. Ambulated in room without AD. Recommend rollator for increased distances. Pt on 4 L O2 throughout session with SpO2 84% during mobility and at rest. RN notified.   Stairs            Wheelchair Mobility    Modified Rankin (Stroke Patients Only)       Balance Overall balance assessment: Needs assistance;History of Falls Sitting-balance support: No upper extremity supported;Feet supported Sitting balance-Leahy Scale: Good     Standing balance support: During functional activity;No upper extremity supported Standing balance-Leahy Scale: Fair                               Pertinent Vitals/Pain Pain Assessment: No/denies pain    Home Living Family/patient expects to be discharged to:: Private residence Living Arrangements: Spouse/significant other Available Help at Discharge: Family;Available 24 hours/day Type of Home: Mobile home Home Access: Stairs to enter Entrance Stairs-Rails: Can reach both;Right;Left Entrance Stairs-Number of Steps: 4 Home Layout: One level Home Equipment: Walker - 2 wheels;Cane - single point;Walker - 4 wheels      Prior Function Level of Independence: Independent               Hand Dominance   Dominant Hand: Right    Extremity/Trunk Assessment   Upper Extremity Assessment Upper Extremity Assessment: Defer to OT evaluation    Lower Extremity Assessment Lower Extremity Assessment: Generalized weakness;RLE deficits/detail RLE Deficits / Details: limited ankle mobility due to h/o fusion, knee with limited extension with h/o fracture/ORIF    Cervical / Trunk Assessment Cervical / Trunk Assessment: Normal  Communication  Cognition Arousal/Alertness: Awake/alert Behavior During Therapy: Flat affect Overall Cognitive Status: Within Functional Limits for tasks assessed                                        General Comments      Exercises     Assessment/Plan    PT Assessment  Patient needs continued PT services  PT Problem List Decreased mobility;Decreased balance;Decreased strength;Decreased knowledge of use of DME;Decreased safety awareness       PT Treatment Interventions DME instruction;Gait training;Stair training;Therapeutic exercise;Therapeutic activities;Functional mobility training;Balance training;Patient/family education    PT Goals (Current goals can be found in the Care Plan section)  Acute Rehab PT Goals Patient Stated Goal: home PT Goal Formulation: With patient Time For Goal Achievement: 04/19/18 Potential to Achieve Goals: Good    Frequency Min 3X/week   Barriers to discharge        Co-evaluation               AM-PAC PT "6 Clicks" Mobility  Outcome Measure Help needed turning from your back to your side while in a flat bed without using bedrails?: None Help needed moving from lying on your back to sitting on the side of a flat bed without using bedrails?: None Help needed moving to and from a bed to a chair (including a wheelchair)?: A Little Help needed standing up from a chair using your arms (e.g., wheelchair or bedside chair)?: None Help needed to walk in hospital room?: A Little Help needed climbing 3-5 steps with a railing? : A Little 6 Click Score: 21    End of Session Equipment Utilized During Treatment: Oxygen Activity Tolerance: Patient tolerated treatment well Patient left: in chair;with call bell/phone within reach Nurse Communication: Mobility status PT Visit Diagnosis: Other abnormalities of gait and mobility (R26.89);History of falling (Z91.81)    Time: 6160-7371 PT Time Calculation (min) (ACUTE ONLY): 11 min   Charges:   PT Evaluation $PT Eval Moderate Complexity: 1 Mod          Lorrin Goodell, PT  Office # [phone] Pager [phone]   Lorriane Shire 04/05/2018, 10:32 AM

## 2018-04-05 NOTE — Progress Notes (Addendum)
Initial Nutrition Assessment  DOCUMENTATION CODES:   Severe malnutrition in context of chronic illness  INTERVENTION:    Ensure Enlive po TID, each supplement provides 350 kcal and 20 grams of protein   Once G-tube ready to be used, recommend:  Start Osmolite 1.2 formula at 20 ml/hr and increase by 10 ml every 12 hours to goal rate of 60 ml/hr  Provides 1728 kcals, 80 gm protein, 1180 ml free water daily   Monitor magnesium, potassium, and phosphorus daily for at least 3 days, MD to replete as needed, as pt is at risk for refeeding syndrome   NUTRITION DIAGNOSIS:   Severe Malnutrition related to chronic illness(COPD, esophageal cancer s/p radiation) as evidenced by severe fat depletion, severe muscle depletion, energy intake < or equal to 75% for > or equal to 1 month  GOAL:   Patient will meet greater than or equal to 90% of their needs  MONITOR:   PO intake, Supplement acceptance, Labs, Skin, Weight trends  REASON FOR ASSESSMENT:   Consult Assessment of nutrition requirement/status  ASSESSMENT:   58 yo Male with PMh of esophageal cancer status post radiation and COPD who presented to the emergency department with difficulty swallowing and shortness of breath.  Pt familiar to this RD from previous hospitalization. He is sitting his his recliner; unopened Ensure Enlive present on tray table. Reports his appetite is "so-so;" he typically grazes during the day at home.  Pt stated two examples of food items he grazes on: cakes and cookies.  RD encouraged pt to try his Ensure Enlive nutrition supplements. Reveals his weight has been stable; denies any recent loss.  He has esophagealdysmotilitysecondary to radiation therapy. IR consulted forG-tube placement for supplemental nutrition. Labs reviewed. Mg 1.4 (L). Na 128 (L).  NUTRITION - FOCUSED PHYSICAL EXAM:    Most Recent Value  Orbital Region  Moderate depletion  Upper Arm Region  Severe depletion  Thoracic  and Lumbar Region  Severe depletion  Buccal Region  Moderate depletion  Temple Region  Severe depletion  Clavicle Bone Region  Severe depletion  Clavicle and Acromion Bone Region  Severe depletion  Scapular Bone Region  Severe depletion  Dorsal Hand  Severe depletion  Patellar Region  Severe depletion  Anterior Thigh Region  Severe depletion  Posterior Calf Region  Severe depletion  Edema (RD Assessment)  None     Diet Order:   Diet Order            DIET DYS 2 Room service appropriate? Yes; Fluid consistency: Nectar Thick  Diet effective now             EDUCATION NEEDS:   No education needs have been identified at this time  Skin:  Skin Assessment: Reviewed RN Assessment  Last BM:  1/10  Height:   Ht Readings from Last 1 Encounters:  04/04/18 5\' 6"  (1.676 m)   Weight:   Wt Readings from Last 1 Encounters:  04/04/18 52.2 kg   BMI:  Body mass index is 18.57 kg/m.  Estimated Nutritional Needs:   Kcal:  1600-1800  Protein:  80-95 gm  Fluid:  1.6-1.8 L  Arthur Holms, RD, LDN Pager #: [phone] After-Hours Pager #: [phone]

## 2018-04-05 NOTE — Progress Notes (Signed)
RT NOTE: When RT entered room patient had removed his O2 and went to the bathroom. Patients sats were in the low 70's. RT placed patient back on O2, sats now at 88% and continuing to improve. Vitals are stable. RT will continue to monitor.

## 2018-04-05 NOTE — Consult Note (Addendum)
Chief Complaint: Patient was seen in consultation today for percutaneous gastric tube placement Chief Complaint  Patient presents with  . Dysphagia   at the request of Dr Annye Rusk   Supervising Physician: Marybelle Killings  Patient Status: [facility] - In-pt  History of Present Illness: Tony Berry is a 58 y.o. male   Esophageal cancer Dysphagia Wt loss Now admitted with aspiration PNA  Had been scheduled as IP once before - but was discharged before procedure done (was off Plavix and awaiting procedure)  Then was scheduled as OP while off Plavix But pt was not being followed by Nutrition or Oncology for continuity of care  Now inpt again with continued wt loss; dysphagia And aspiration Teaching Service now with pt and plans for follow up  Note in chart from Nutrition consultation Severe malnutrition; muscle and fat depletion  Request for percutaneous gastric tube placement Approved imaging per IR Rad  LD Plavix 1/11 per chart Plan for 1/16 Thurs    Past Medical History:  Diagnosis Date  . Cancer ([place])    Throat cancer  . Hypertension     Past Surgical History:  Procedure Laterality Date  . BACK SURGERY    . FOOT SURGERY      Allergies: Patient has no known allergies.  Medications: Prior to Admission medications   Medication Sig Start Date End Date Taking? Authorizing Provider  aspirin 81 MG chewable tablet Chew 1 tablet (81 mg total) by mouth daily. Please do not take for five days before your gastrostomy tube placement procedure Patient taking differently: Chew 81 mg by mouth daily.  03/16/18  Yes Asencion Noble, MD  clopidogrel (PLAVIX) 75 MG tablet Take 1 tablet (75 mg total) by mouth daily. Please do not take for the 5 days before your gastrostomy tube procedure Patient taking differently: Take 75 mg by mouth daily.  03/15/18  Yes Asencion Noble, MD  fluticasone (FLONASE) 50 MCG/ACT nasal spray Place 1 spray into both nostrils daily.    Yes [provider]  Fluticasone-Umeclidin-Vilant (TRELEGY ELLIPTA) 100-62.5-25 MCG/INH AEPB Inhale 1 puff into the lungs daily.   Yes [provider]  Maltodextrin-Xanthan Gum (RESOURCE THICKENUP CLEAR) POWD Take 120 g by mouth as needed. 03/15/18  Yes Asencion Noble, MD  METHADONE HCL PO Take 115 mg by mouth daily.    Yes [provider]  nicotine (NICODERM CQ - DOSED IN MG/24 HOURS) 21 mg/24hr patch Place 1 patch (21 mg total) onto the skin daily. 03/16/18  Yes Asencion Noble, MD  polyethylene glycol [facility] / Floria Raveling) packet Take 17 g by mouth daily. Patient taking differently: Take 17 g by mouth daily as needed.  03/16/18  Yes Asencion Noble, MD  senna-docusate (SENOKOT-S) 8.6-50 MG tablet Take 1 tablet by mouth at bedtime as needed for mild constipation. 03/15/18  Yes Asencion Noble, MD  feeding supplement, ENSURE ENLIVE, (ENSURE ENLIVE) LIQD Take 237 mLs by mouth 3 (three) times daily between meals. Patient not taking: Reported on 04/04/2018 03/15/18 04/14/18  Asencion Noble, MD     History reviewed. No pertinent family history.  Social History   Socioeconomic History  . Marital status: Married    Spouse name: Not on file  . Number of children: Not on file  . Years of education: Not on file  . Highest education level: Not on file  Occupational History  . Not on file  Social Needs  . Financial resource strain: Not on file  .  Food insecurity:    Worry: Not on file    Inability: Not on file  . Transportation needs:    Medical: Not on file    Non-medical: Not on file  Tobacco Use  . Smoking status: Former Smoker    Packs/day: 1.00    Years: 30.00    Pack years: 30.00    Types: Cigarettes    Start date: 03/11/1988    Last attempt to quit: 02/25/2018    Years since quitting: 0.1  . Smokeless tobacco: Never Used  . Tobacco comment: patient states he quit 2 weeks ago  Substance and Sexual Activity  . Alcohol use: No  . Drug  use: No  . Sexual activity: Not on file  Lifestyle  . Physical activity:    Days per week: Not on file    Minutes per session: Not on file  . Stress: Not on file  Relationships  . Social connections:    Talks on phone: Not on file    Gets together: Not on file    Attends religious service: Not on file    Active member of club or organization: Not on file    Attends meetings of clubs or organizations: Not on file    Relationship status: Not on file  Other Topics Concern  . Not on file  Social History Narrative  . Not on file    Review of Systems: A 12 point ROS discussed and pertinent positives are indicated in the HPI above.  All other systems are negative.  Review of Systems  Constitutional: Positive for activity change, appetite change, fatigue and unexpected weight change. Negative for fever.  HENT: Positive for sore throat and trouble swallowing.   Respiratory: Negative for cough and shortness of breath.   Gastrointestinal: Positive for nausea.  Neurological: Positive for weakness.  Psychiatric/Behavioral: Negative for behavioral problems and confusion.    Vital Signs: BP 119/80   Pulse 78   Temp 98.2 F (36.8 C) (Oral)   Resp 16   Ht 5\' 6"  (1.676 m)   Wt 115 lb 1.3 oz (52.2 kg)   SpO2 92%   BMI 18.57 kg/m   Physical Exam Vitals signs reviewed.  Constitutional:      Comments: Thin/frail  Cardiovascular:     Rate and Rhythm: Normal rate and regular rhythm.     Heart sounds: Normal heart sounds.  Pulmonary:     Effort: Pulmonary effort is normal.     Breath sounds: Normal breath sounds.  Abdominal:     General: Bowel sounds are normal.     Palpations: Abdomen is soft.  Musculoskeletal: Normal range of motion.  Skin:    General: Skin is warm and dry.  Neurological:     General: No focal deficit present.     Mental Status: He is alert and oriented to person, place, and time.  Psychiatric:        Mood and Affect: Mood normal.        Behavior: Behavior  normal.        Thought Content: Thought content normal.        Judgment: Judgment normal.     Imaging: Ct Abdomen Wo Contrast  Result Date: 03/15/2018 CLINICAL DATA:  Preop gastrostomy EXAM: CT ABDOMEN WITHOUT CONTRAST TECHNIQUE: Multidetector CT imaging of the abdomen was performed following the standard protocol without IV contrast. COMPARISON:  PET-CT 01/06/2018 FINDINGS: Lower chest: Bilateral posterior lower lobe consolidation left greater than right is compatible with aspiration pneumonia. Centrilobular nodules  are scattered throughout the remainder of the visualized lung bases. Hepatobiliary: Unremarkable Pancreas: Unremarkable Spleen: Unremarkable Adrenals/Urinary Tract: Unremarkable Stomach/Bowel: The stomach is well positioned against the anterior abdominal wall. There is no disproportionate dilatation of bowel to suggest obstruction. Vascular/Lymphatic: Atherosclerotic vascular calcifications in the aorta and iliac arteries. No obvious abnormal retroperitoneal adenopathy. Other: No free fluid. Musculoskeletal: No vertebral compression deformity. IMPRESSION: There is favorable anatomy for gastrostomy tube placement. Bilateral lower lobe consolidation is worrisome for aspiration pneumonia. Electronically Signed   By: Marybelle Killings M.D.   On: 03/15/2018 07:24   Dg Chest 2 View  Result Date: 04/04/2018 CLINICAL DATA:  Shortness of breath. History of aspiration pneumonia. EXAM: CHEST - 2 VIEW COMPARISON:  Chest CT 03/18/2018; chest radiograph 03/10/2018 FINDINGS: Monitoring leads overlie the patient. Normal cardiac and mediastinal contours. Re demonstrated bilateral mid and lower lung patchy areas of consolidation. No pleural effusion or pneumothorax. Thoracic spine degenerative changes. IMPRESSION: Persistent bilateral mid and lower lung consolidative opacities concerning for aspiration pneumonia. Electronically Signed   By: Lovey Newcomer M.D.   On: 04/04/2018 10:17   Dg Chest 2 View  Result  Date: 03/10/2018 CLINICAL DATA:  Follow-up pneumonia. EXAM: CHEST - 2 VIEW COMPARISON:  02/03/2018 FINDINGS: Heart size is normal. Aortic atherosclerosis. Patchy bilateral lower lobe pneumonia, worsened when compared to the study of November. No effusion. Upper lobes remain clear. IMPRESSION: Worsening of bilateral lower lobe pneumonia compared to the study of 02/03/2018. Electronically Signed   By: Nelson Chimes M.D.   On: 03/10/2018 16:06   Ct Soft Tissue Neck W Contrast  Result Date: 03/10/2018 CLINICAL DATA:  58 y/o M; history of throat cancer presenting with hoarseness. Concern for recurrent tumor. EXAM: CT NECK WITH CONTRAST TECHNIQUE: Multidetector CT imaging of the neck was performed using the standard protocol following the bolus administration of intravenous contrast. CONTRAST:  171mL ISOVUE-370 IOPAMIDOL (ISOVUE-370) INJECTION 76% COMPARISON:  02/01/2018, 08/25/2017, 11/03/2016 CT neck. 01/06/2018 PET-CT. FINDINGS: Pharynx and larynx: Low-attenuation thickening of supraglottic mucosa, aryepiglottic folds, epiglottis. Linear non masslike enhancement of superficial laryngeal mucosa. No discrete mass lesion identified. Patent airway. Salivary glands: No inflammation, mass, or stone. Thyroid: Normal. Lymph nodes: Stable right supraclavicular lymph node measuring 15 x 9 mm (series 2, image 79). Additional cervical lymph nodes are subcentimeter and stable. Vascular: Mixed plaque of the bilateral carotid bifurcations. Mild left proximal ICA stenosis. Limited intracranial: Negative. Visualized orbits: Negative. Mastoids and visualized paranasal sinuses: Clear. Skeleton: Advanced multilevel cervical spondylosis with moderate to severe spinal canal stenosis at the C5-6 level. Uncovertebral and facet hypertrophy encroaches on neural foramen throughout the cervical spine. Upper chest: Waxing and waning clustered ground-glass nodules in bronchovascular distribution in the right upper lobe likely related to  bronchiolitis. Moderate emphysema. Other: None. IMPRESSION: 1. Linear non masslike enhancement of superficial supraglottic mucosa as well as low-attenuation mucosal thickening of supraglottic mucosa and epiglottis is stable from prior CT of the neck. Findings likely represent posttreatment changes. No discrete mass identified. Patent airway. 2. Stable cervical lymph nodes. 3. 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 4. Waxing and waning ground-glass nodules in right upper lobe, likely minimal bronchiolitis. 5. Emphysema. Electronically Signed   By: Kristine Garbe M.D.   On: 03/10/2018 16:44   Ct Angio Chest Pe W And/or Wo Contrast  Result Date: 03/10/2018 CLINICAL DATA:  Chills. Recent diagnosis of pneumonia. History of head and neck cancer. EXAM: CT ANGIOGRAPHY CHEST WITH CONTRAST TECHNIQUE: Multidetector CT  imaging of the chest was performed using the standard protocol during bolus administration of intravenous contrast. Multiplanar CT image reconstructions and MIPs were obtained to evaluate the vascular anatomy. CONTRAST:  155mL ISOVUE-370 IOPAMIDOL (ISOVUE-370) INJECTION 76% COMPARISON:  Chest radiograph March 10, 2018 and CT chest January 31, 2018 FINDINGS: CARDIOVASCULAR: Adequate contrast opacification of the pulmonary artery's. Main pulmonary artery is not enlarged. No pulmonary arterial filling defects to the level of the subsegmental branches. Heart size is normal, no right heart strain. No pericardial effusion. Thoracic aorta is normal course and caliber, mild calcific atherosclerosis. MEDIASTINUM/NODES: No lymphadenopathy by CT size criteria. LUNGS/PLEURA: Tracheobronchial tree is patent, no pneumothorax. Worsening tree-in-bud infiltrates and multifocal consolidation with a basilar predominance. No discrete pulmonary nodule in LEFT lower lobe. No pleural effusion. Moderate centrilobular emphysema with increased lung volumes,  flattened hemidiaphragms. UPPER ABDOMEN: Non-acute. MUSCULOSKELETAL: Non-acute. Review of the MIP images confirms the above findings. IMPRESSION: 1. Worsening tree-in-bud infiltrates and focal consolidation consistent with multifocal pneumonia, given basilar predominance, aspiration is possible. Aortic Atherosclerosis (ICD10-I70.0) and Emphysema (ICD10-J43.9). Electronically Signed   By: Elon Alas M.D.   On: 03/10/2018 16:43   Dg Swallowing Func-speech Pathology  Result Date: 03/12/2018 Objective Swallowing Evaluation: Type of Study: MBS-Modified Barium Swallow Study  Patient Details Name: JAUN GALLUZZO MRN: [id number] Date of Birth: [birth year] Today's Date: 03/12/2018 Time: SLP Start Time (ACUTE ONLY): 26 -SLP Stop Time (ACUTE ONLY): 1200 SLP Time Calculation (min) (ACUTE ONLY): 30 min Past Medical History: Past Medical History: Diagnosis Date . Hypertension  Past Surgical History: Past Surgical History: Procedure Laterality Date . BACK SURGERY   . FOOT SURGERY   HPI: Mr. 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 Chest CT 12/18: "Worsening tree-in-bud infiltrates and focal consolidation consistent with multifocal pneumonia, given basilar predominance, aspiration is possible."  Pt reports being placed on thickened liquid during admission to [place], but could not recall consistency.  Pt reports most recent swallow assessment by FEES 3-4 months previously with no abnormal findings.  Subjective: alert, participatory Assessment / Plan / Recommendation Pt presents with a chronic pharyngeal dysphagia that appears to be deteriorating per pt's account.  There are areas of thickening/abnormal soft tissue of pharyngeal wall, epiglottis, and periarytenoids - this can be  visualized when coated with barium.  Pt demonstrates reduced velopharyngeal approximation. He is unable to achieve laryngeal vestibular closure, which leads to consistent penetration of all boluses into the larynx.  A portion of this material passes through vocal folds and into trachea.  Aspiration elicits a cough.  He is able to eject a portion back into pharynx which passes through UES.  There is aspiration of all consistences - liquid and solid - either before or after the swallow event.  He attempted multiple postural adjustments and strategies to improve airway protection (head tilts and turns left and right, chin tuck, supraglottic swallow) -  these were not effective. He expectorates residuals from pharynx.  Pt's voice is generally aphonic with some ability to achieve voice with effort.     We discussed the MBS and reviewed the video in real time and afterward in his room.  Pt wants to continue eating; he verbalizes understanding that a percentage of all that he eats/drinks is aspirated; he is willing to consider a PEG  again for improved nutrition.  Discussed results with Dr. Rebeca Alert. Deterioration in swallow function can be attributable to late effects of radiation/fibrosis, but pt needs to be seen either by his primary ENT or an ENT here for further w/u, repeat laryngoscopy given anatomy and voice changes.  Continue soft diet, nectar-thick liquids for now (nectars tolerated marginally better than thin liquids).  SLP will follow for plan, further education with pt.    CHL IP TREATMENT RECOMMENDATION 03/12/2018 Treatment Recommendations Defer until completion of intrumental exam;Therapy as outlined in treatment plan below   Prognosis 03/12/2018 Prognosis for Safe Diet Advancement Guarded Barriers to Reach Goals -- Barriers/Prognosis Comment -- CHL IP DIET RECOMMENDATION 03/12/2018 SLP Diet Recommendations Dysphagia 2 (Fine chop) solids;Nectar thick liquid Liquid Administration via Cup Medication  Administration Crushed with puree Compensations Small sips/bites;Clear throat intermittently Postural Changes Remain semi-upright after after feeds/meals (Comment)   CHL IP OTHER RECOMMENDATIONS 03/12/2018 Recommended Consults Consider ENT evaluation Oral Care Recommendations Oral care BID Other Recommendations Order thickener from pharmacy   CHL IP FOLLOW UP RECOMMENDATIONS 03/12/2018 Follow up Recommendations Other (comment)   CHL IP FREQUENCY AND DURATION 03/12/2018 Speech Therapy Frequency (ACUTE ONLY) min 2x/week Treatment Duration 1 week      CHL IP ORAL PHASE 03/12/2018 Oral Phase WFL Oral - Pudding Teaspoon -- Oral - Pudding Cup -- Oral - Honey Teaspoon -- Oral - Honey Cup -- Oral - Nectar Teaspoon -- Oral - Nectar Cup -- Oral - Nectar Straw -- Oral - Thin Teaspoon -- Oral - Thin Cup -- Oral - Thin Straw -- Oral - Puree -- Oral - Mech Soft -- Oral - Regular -- Oral - Multi-Consistency -- Oral - Pill -- Oral Phase - Comment --  CHL IP PHARYNGEAL PHASE 03/12/2018 Pharyngeal Phase Impaired Pharyngeal- Pudding Teaspoon -- Pharyngeal -- Pharyngeal- Pudding Cup -- Pharyngeal -- Pharyngeal- Honey Teaspoon -- Pharyngeal -- Pharyngeal- Honey Cup -- Pharyngeal -- Pharyngeal- Nectar Teaspoon Delayed swallow initiation-pyriform sinuses;Reduced pharyngeal peristalsis;Reduced epiglottic inversion;Reduced anterior laryngeal mobility;Reduced airway/laryngeal closure;Penetration/Aspiration before swallow;Penetration/Apiration after swallow;Pharyngeal residue - pyriform;Trace aspiration Pharyngeal Material enters airway, passes BELOW cords and not ejected out despite cough attempt by patient Pharyngeal- Nectar Cup Delayed swallow initiation-pyriform sinuses;Reduced pharyngeal peristalsis;Reduced epiglottic inversion;Reduced anterior laryngeal mobility;Reduced airway/laryngeal closure;Penetration/Aspiration before swallow;Penetration/Apiration after swallow;Trace aspiration;Pharyngeal residue - pyriform;Pharyngeal residue -  valleculae Pharyngeal Material enters airway, passes BELOW cords and not ejected out despite cough attempt by patient Pharyngeal- Nectar Straw -- Pharyngeal -- Pharyngeal- Thin Teaspoon Delayed swallow initiation-pyriform sinuses;Reduced pharyngeal peristalsis;Reduced epiglottic inversion;Reduced anterior laryngeal mobility;Reduced airway/laryngeal closure;Penetration/Aspiration before swallow;Penetration/Apiration after swallow;Trace aspiration Pharyngeal Material enters airway, passes BELOW cords and not ejected out despite cough attempt by patient Pharyngeal- Thin Cup Delayed swallow initiation-pyriform sinuses;Reduced pharyngeal peristalsis;Reduced epiglottic inversion;Reduced anterior laryngeal mobility;Reduced airway/laryngeal closure;Penetration/Aspiration before swallow;Penetration/Apiration after swallow;Trace aspiration Pharyngeal Material enters airway, passes BELOW cords and not ejected out despite cough attempt by patient Pharyngeal- Thin Straw -- Pharyngeal -- Pharyngeal- Puree Delayed swallow initiation-pyriform sinuses;Reduced pharyngeal peristalsis;Reduced epiglottic inversion;Reduced anterior laryngeal mobility;Reduced airway/laryngeal closure;Penetration/Apiration after swallow;Penetration/Aspiration before swallow;Pharyngeal residue - pyriform;Pharyngeal residue - valleculae Pharyngeal Material enters airway, passes BELOW cords and not ejected out despite cough attempt by patient Pharyngeal- Mechanical Soft -- Pharyngeal -- Pharyngeal- Regular -- Pharyngeal -- Pharyngeal- Multi-consistency -- Pharyngeal -- Pharyngeal- Pill -- Pharyngeal -- Pharyngeal Comment --  CHL IP CERVICAL ESOPHAGEAL PHASE 03/12/2018 Cervical Esophageal Phase (No Data) Pudding Teaspoon -- Pudding Cup -- Honey Teaspoon -- Honey Cup -- Nectar Teaspoon -- Nectar Cup -- Nectar Straw -- Thin Teaspoon -- Thin  Cup -- Thin Straw -- Puree -- Mechanical Soft -- Regular -- Multi-consistency -- Pill -- Cervical Esophageal Comment --  [name] 03/12/2018, 2:39 PM              US Abdomen Limited Ruq  Result Date: 03/11/2018 CLINICAL DATA:  Elevated LFT EXAM: ULTRASOUND ABDOMEN LIMITED RIGHT UPPER QUADRANT COMPARISON:  PET-CT 01/06/2017 FINDINGS: Gallbladder: No gallstones or wall thickening visualized. No sonographic Murphy sign noted by sonographer. Common bile duct: Diameter: Dilated up to 13.2 mm. Liver: No focal lesion identified. Within normal limits in parenchymal echogenicity. Portal vein is patent on color Doppler imaging with normal direction of blood flow towards the liver. Trace free fluid adjacent to the liver. Incidental note made of small right pleural effusion IMPRESSION: 1. Negative for gallstones. 2. Dilated common bile duct up to 13.2 mm. Further evaluation with MRCP could be obtained as indicated. 3. Trace free fluid in the right upper quadrant and small right pleural effusion. Electronically Signed   By: Donavan Foil M.D.   On: 03/11/2018 18:25    Labs:  CBC: Recent Labs    03/13/18 0237 03/15/18 0335 03/25/18 0924 04/04/18 0917  WBC 8.2 5.2 8.0 11.8*  HGB 9.8* 9.5* 12.1* 11.1*  HCT 31.5* 30.8* 40.6 35.4*  PLT 200 197 405* 248    COAGS: Recent Labs    03/11/18 1435 03/25/18 0924  INR 1.41 1.06    BMP: Recent Labs    03/15/18 0335 03/25/18 0924 04/04/18 0917 04/05/18 0223  NA 134* 136 128* 128*  K 4.1 3.9 4.1 3.8  CL 89* 94* 84* 86*  CO2 38* 36* 36* 36*  GLUCOSE 79 95 110* 97  BUN <5* <5* 9 7  CALCIUM 8.0* 8.9 8.8* 8.2*  CREATININE 0.44* 0.55* 0.50* 0.41*  GFRNONAA >60 >60 >60 >60  GFRAA >60 >60 >60 >60    LIVER FUNCTION TESTS: Recent Labs    03/13/18 0237 03/14/18 0350 03/15/18 0335 04/04/18 0917 04/05/18 0223  BILITOT 0.4 0.6 0.3 0.9  --   AST 181* 85* 41 13*  --   ALT 215* 156* 111* 8  --   ALKPHOS 42 40 37* 52  --   PROT 5.7* 5.9* 5.6* 7.3  --   ALBUMIN 1.9* 1.9* 2.0* 2.5* 2.3*    TUMOR MARKERS: No results for input(s): AFPTM, CEA, CA199,  CHROMGRNA in the last 8760 hours.  Assessment and Plan:  Esophageal cancer Dysphagia Wt loss Severe malnutrition Aspiration Scheduled for percutaneous gastric tube placement LD Plavix 1/11 Plan for 1/16 in IR Risks and benefits discussed with the patient including, but not limited to the need for a barium enema during the procedure, bleeding, infection, peritonitis, or damage to adjacent structures.  All of the patient's questions were answered, patient is agreeable to proceed. Consent signed and in chart.   Thank you for this interesting consult.  I greatly enjoyed meeting Seng M Mulroy and look forward to participating in their care.  A copy of this report was sent to the requesting provider on this date.  Electronically Signed: Lavonia Drafts, PA-C 04/05/2018, 12:03 PM   I spent a total of 40 Minutes    in face to face in clinical consultation, greater than 50% of which was counseling/coordinating care for percutaneous gastric tube placement

## 2018-04-06 ENCOUNTER — Inpatient Hospital Stay (HOSPITAL_COMMUNITY): Payer: Medicaid Other

## 2018-04-06 DIAGNOSIS — I2510 Atherosclerotic heart disease of native coronary artery without angina pectoris: Secondary | ICD-10-CM

## 2018-04-06 DIAGNOSIS — R918 Other nonspecific abnormal finding of lung field: Secondary | ICD-10-CM

## 2018-04-06 DIAGNOSIS — C131 Malignant neoplasm of aryepiglottic fold, hypopharyngeal aspect: Secondary | ICD-10-CM

## 2018-04-06 LAB — RENAL FUNCTION PANEL
Albumin: 2.3 g/dL — ABNORMAL LOW (ref 3.5–5.0)
Anion gap: 10 (ref 5–15)
BUN: <5 mg/dL — ABNORMAL LOW (ref 6–20)
CO2: 36 mmol/L — ABNORMAL HIGH (ref 22–32)
Calcium: 8.5 mg/dL — ABNORMAL LOW (ref 8.9–10.3)
Chloride: 86 mmol/L — ABNORMAL LOW (ref 98–111)
Creatinine, Ser: 0.39 mg/dL — ABNORMAL LOW (ref 0.61–1.24)
GFR calc Af Amer: >60 mL/min (ref >60)
GFR calc non Af Amer: >60 mL/min (ref >60)
Glucose, Bld: 72 mg/dL (ref 70–99)
Phosphorus: 3.3 mg/dL (ref 2.5–4.6)
Potassium: 3.9 mmol/L (ref 3.5–5.1)
SODIUM: 132 mmol/L — AB (ref 135–145)

## 2018-04-06 LAB — MAGNESIUM: Magnesium: 1.8 mg/dL (ref 1.7–2.4)

## 2018-04-06 MED ORDER — IOPAMIDOL (ISOVUE-300) INJECTION 61%
75.0000 mL | Freq: Once | INTRAVENOUS | Status: AC | PRN
  Administered 2018-04-06: 75 mL via INTRAVENOUS

## 2018-04-06 MED ORDER — METHADONE HCL 10 MG PO TABS
115.0000 mg | ORAL_TABLET | Freq: Every day | ORAL | Status: DC
  Administered 2018-04-07 – 2018-04-09 (×3): 115 mg via ORAL
  Filled 2018-04-06 (×5): qty 12

## 2018-04-06 NOTE — Progress Notes (Signed)
   04/06/18 1251  Clinical Encounter Type  Visited With Patient  Visit Type Follow-up;Other (Comment) (AD)  Referral From Chaplain  Consult/Referral To Chaplain  The chaplain followed up with the Pt.  AD education was left with Pt. to discuss with his brother.  The Pt informed the chaplain he is still interested in more information but has not talked to his brother.  The chaplain will F/U with AD education and spiritual care as directed by the Pt.

## 2018-04-06 NOTE — Progress Notes (Addendum)
   Subjective: No overnight events. Patient reports he is feeling well this morning. Denies any cough or SOB. It was explained that the plan is for him to have PEG tube placed on 1/16, after holding his plavix for five days. All questions and concerns were addressed.   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 He was subsequently admitted for aspiration pneumonia.  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   [name], DO 04/06/2018, 6:38 AM Pager: [phone]

## 2018-04-07 LAB — RENAL FUNCTION PANEL
Albumin: 2.1 g/dL — ABNORMAL LOW (ref 3.5–5.0)
Anion gap: 8 (ref 5–15)
CO2: 39 mmol/L — ABNORMAL HIGH (ref 22–32)
Calcium: 8.2 mg/dL — ABNORMAL LOW (ref 8.9–10.3)
Chloride: 85 mmol/L — ABNORMAL LOW (ref 98–111)
Creatinine, Ser: 0.43 mg/dL — ABNORMAL LOW (ref 0.61–1.24)
GFR calc Af Amer: >60 mL/min (ref >60)
Glucose, Bld: 138 mg/dL — ABNORMAL HIGH (ref 70–99)
Phosphorus: 3.4 mg/dL (ref 2.5–4.6)
Potassium: 3.7 mmol/L (ref 3.5–5.1)
Sodium: 132 mmol/L — ABNORMAL LOW (ref 135–145)

## 2018-04-07 LAB — MAGNESIUM: Magnesium: 1.5 mg/dL — ABNORMAL LOW (ref 1.7–2.4)

## 2018-04-07 MED ORDER — ENOXAPARIN SODIUM 40 MG/0.4ML ~~LOC~~ SOLN
40.0000 mg | SUBCUTANEOUS | Status: DC
  Administered 2018-04-09 – 2018-04-11 (×3): 40 mg via SUBCUTANEOUS
  Filled 2018-04-07 (×3): qty 0.4

## 2018-04-07 MED ORDER — MAGNESIUM SULFATE 2 GM/50ML IV SOLN
2.0000 g | Freq: Once | INTRAVENOUS | Status: AC
  Administered 2018-04-07: 2 g via INTRAVENOUS
  Filled 2018-04-07: qty 50

## 2018-04-07 MED ORDER — CEFAZOLIN SODIUM-DEXTROSE 2-4 GM/100ML-% IV SOLN
2.0000 g | INTRAVENOUS | Status: AC
  Administered 2018-04-08: 2 g via INTRAVENOUS

## 2018-04-07 NOTE — Progress Notes (Addendum)
   Subjective: No overnight events. Patient was seen sleeping on rounds this morning.   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 He was subsequently admitted for aspiration pneumonia.  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 Mild improvement within the left lower lobe. Worsening findings may besecondary to aspiration or multifocal pneumonia. Mild mediastinal and hilar adenopathy likely reactive.  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 PEG tube to be placed 1/16, anticipate discharge following PEG tube placement.  [name], DO 04/07/2018, 6:37 AM Pager: [phone]

## 2018-04-07 NOTE — Progress Notes (Signed)
Occupational Therapy Treatment Patient Details Name: Tony Berry MRN: [id number] DOB: [birth year] Today's Date: 04/07/2018    History of present illness 58 year old male with history of esophageal cancer status post radiation and COPD who presented to the emergency department with difficulty swallowing and shortness of breath. On admission the patient was febrile with a leukocytosis and a new right lower lobe infiltrate seen on chest x-ray. He was subsequently admitted for aspiration pneumonia.   OT comments  Pt progressing toward goals. Pt currently requires min guard assist for ambulation. Pt completed toileting hygiene and LB dressing while seated with S. Provided pt with energy conservation handout and further discussed application of these techniques. Pt on 4L O2 throughout session and maintained SpO2 >90%. Pt will continue to benefit from continued skilled OT services to increase independence and safety with ADL and functional mobility to allow discharge to venue listed below. OT will continue to follow acutely.    Follow Up Recommendations  No OT follow up;Supervision - Intermittent    Equipment Recommendations  None recommended by OT    Recommendations for Other Services      Precautions / Restrictions Precautions Precautions: Fall       Mobility Bed Mobility Overal bed mobility: Modified Independent                Transfers Overall transfer level: Needs assistance Equipment used: None Transfers: Sit to/from Stand Sit to Stand: Supervision              Balance Overall balance assessment: Needs assistance;History of Falls Sitting-balance support: No upper extremity supported;Feet supported Sitting balance-Leahy Scale: Normal Sitting balance - Comments: pt doffed/donned pants and briefs while seated   Standing balance support: During functional activity;No upper extremity supported Standing balance-Leahy Scale: Fair Standing balance comment: minguard for  standing balance                           ADL either performed or assessed with clinical judgement   ADL Overall ADL's : Needs assistance/impaired     Grooming: Min guard;Standing               Lower Body Dressing: Set up;Sit to/from stand Lower Body Dressing Details (indicate cue type and reason): pt doffed and donned pants and briefs while seated on toilet Toilet Transfer: Supervision/safety;Ambulation Toilet Transfer Details (indicate cue type and reason): pt ambulated from bed to toilet while pushing IV pole Toileting- Clothing Manipulation and Hygiene: Supervision/safety;Sit to/from stand       Functional mobility during ADLs: Min guard General ADL Comments: min guard for ambulation in room     Vision       Perception     Praxis      Cognition Arousal/Alertness: Awake/alert Behavior During Therapy: Flat affect Overall Cognitive Status: Within Functional Limits for tasks assessed                                 General Comments: pt asleep upon arrival, pleasantly agreeable to participate during session. speech is difficult to understand due to pt speaking with low volume and mumbled speech;cognition appears to be WNL,         Exercises     Shoulder Instructions       General Comments      Pertinent Vitals/ Pain       Pain Assessment: No/denies pain Pain Intervention(s): Monitored during session  Home Living                                          Prior Functioning/Environment              Frequency  Min 2X/week        Progress Toward Goals  OT Goals(current goals can now be found in the care plan section)  Progress towards OT goals: Progressing toward goals  Acute Rehab OT Goals Patient Stated Goal: to get back home OT Goal Formulation: With patient Time For Goal Achievement: 04/19/18 Potential to Achieve Goals: Good ADL Goals Pt Will Perform Grooming: with modified  independence;standing Pt Will Perform Lower Body Dressing: with modified independence;sit to/from stand Pt Will Perform Toileting - Clothing Manipulation and hygiene: with modified independence;sit to/from stand Pt Will Perform Tub/Shower Transfer: with modified independence;ambulating;shower seat Additional ADL Goal #1: Pt will demonstrate at least one energy conservation technique during ADL task completion.  Plan Discharge plan remains appropriate    Co-evaluation                 AM-PAC OT "6 Clicks" Daily Activity     Outcome Measure   Help from another person eating meals?: None Help from another person taking care of personal grooming?: None Help from another person toileting, which includes using toliet, bedpan, or urinal?: A Little Help from another person bathing (including washing, rinsing, drying)?: None Help from another person to put on and taking off regular upper body clothing?: None Help from another person to put on and taking off regular lower body clothing?: A Little 6 Click Score: 22    End of Session Equipment Utilized During Treatment: Oxygen  OT Visit Diagnosis: Muscle weakness (generalized) (M62.81)   Activity Tolerance Patient tolerated treatment well   Patient Left with call bell/phone within reach;in chair;with nursing/sitter in room   Nurse Communication Mobility status        Time: 2878-6767 OT Time Calculation (min): 25 min  Charges: OT General Charges $OT Visit: 1 Visit OT Treatments $Self Care/Home Management : 23-37 mins  Dorinda Hill OTR/L Acute Rehabilitation Services Office: [place] 04/07/2018, 9:47 AM

## 2018-04-07 NOTE — Progress Notes (Signed)
Physical Therapy Treatment Patient Details Name: Tony Berry MRN: [id number] DOB: 09/24/1960 Today's Date: 04/07/2018    History of Present Illness 58 year old male with history of esophageal cancer status post radiation and COPD who presented to the emergency department with difficulty swallowing and shortness of breath. On admission the patient was febrile with a leukocytosis and a new right lower lobe infiltrate seen on chest x-ray. He was subsequently admitted for aspiration pneumonia.    PT Comments    Patient progressing with therapy, ambulating further distances with less assistance this visit. No LOB, DOE, or desat on 4L with 220' ambulation, supervision level. Will cont to assess O2 dependence next PT visit. Agree with no PT follow up after d/c.     Follow Up Recommendations  No PT follow up;Supervision for mobility/OOB     Equipment Recommendations  None recommended by PT    Recommendations for Other Services       Precautions / Restrictions Precautions Precautions: Fall Precaution Comments: watch sats Restrictions Weight Bearing Restrictions: No    Mobility  Bed Mobility Overal bed mobility: Modified Independent                Transfers Overall transfer level: Needs assistance Equipment used: None Transfers: Sit to/from Stand Sit to Stand: Supervision            Ambulation/Gait Ambulation/Gait assistance: Min guard;Supervision Gait Distance (Feet): 230 Feet Assistive device: None Gait Pattern/deviations: Step-through pattern;Decreased stride length Gait velocity: decreased   General Gait Details: pt ambulating hallway, intermittent use of IV pole for comfort but not needed. mild unsteady gait. SpO2 and HR WNL on 4L [place]   Stairs             Wheelchair Mobility    Modified Rankin (Stroke Patients Only)       Balance Overall balance assessment: Needs assistance;History of Falls Sitting-balance support: No upper extremity  supported;Feet supported Sitting balance-Leahy Scale: Normal Sitting balance - Comments: pt doffed/donned pants and briefs while seated   Standing balance support: During functional activity;No upper extremity supported Standing balance-Leahy Scale: Fair                              Cognition Arousal/Alertness: Awake/alert Behavior During Therapy: Flat affect Overall Cognitive Status: Within Functional Limits for tasks assessed                                 General Comments: pt asleep upon arrival, pleasantly agreeable to participate during session. speech is difficult to understand due to pt speaking with low volume and mumbled speech;cognition appears to be WNL,       Exercises      General Comments        Pertinent Vitals/Pain Pain Assessment: No/denies pain Pain Intervention(s): Limited activity within patient's tolerance;Monitored during session;Premedicated before session    Home Living                      Prior Function            PT Goals (current goals can now be found in the care plan section) Acute Rehab PT Goals Patient Stated Goal: to get back home PT Goal Formulation: With patient Time For Goal Achievement: 04/19/18 Potential to Achieve Goals: Good Progress towards PT goals: Progressing toward goals    Frequency    Min 3X/week  PT Plan Current plan remains appropriate;Equipment recommendations need to be updated    Co-evaluation              AM-PAC PT "6 Clicks" Mobility   Outcome Measure  Help needed turning from your back to your side while in a flat bed without using bedrails?: None Help needed moving from lying on your back to sitting on the side of a flat bed without using bedrails?: None Help needed moving to and from a bed to a chair (including a wheelchair)?: A Little Help needed standing up from a chair using your arms (e.g., wheelchair or bedside chair)?: None Help needed to walk in  hospital room?: A Little Help needed climbing 3-5 steps with a railing? : A Little 6 Click Score: 21    End of Session Equipment Utilized During Treatment: Oxygen Activity Tolerance: Patient tolerated treatment well Patient left: in chair;with call bell/phone within reach Nurse Communication: Mobility status PT Visit Diagnosis: Other abnormalities of gait and mobility (R26.89);History of falling (Z91.81)     Time: 1552-0802 PT Time Calculation (min) (ACUTE ONLY): 18 min  Charges:  $Gait Training: 8-22 mins                     Reinaldo Berber, PT, DPT Acute Rehabilitation Services Pager: [phone] Office: [phone]     Reinaldo Berber 04/07/2018, 5:24 PM

## 2018-04-08 ENCOUNTER — Inpatient Hospital Stay (HOSPITAL_COMMUNITY): Payer: Medicaid Other

## 2018-04-08 ENCOUNTER — Encounter (HOSPITAL_COMMUNITY): Payer: Self-pay | Admitting: Interventional Radiology

## 2018-04-08 DIAGNOSIS — R1310 Dysphagia, unspecified: Secondary | ICD-10-CM

## 2018-04-08 HISTORY — PX: IR GASTROSTOMY TUBE MOD SED: IMG625

## 2018-04-08 LAB — CBC
HCT: 35 % — ABNORMAL LOW (ref 39.0–52.0)
Hemoglobin: 10.5 g/dL — ABNORMAL LOW (ref 13.0–17.0)
MCH: 28.1 pg (ref 26.0–34.0)
MCHC: 30 g/dL (ref 30.0–36.0)
MCV: 93.6 fL (ref 80.0–100.0)
NRBC: 0 % (ref 0.0–0.2)
Platelets: 248 10*3/uL (ref 150–400)
RBC: 3.74 MIL/uL — ABNORMAL LOW (ref 4.22–5.81)
RDW: 12.5 % (ref 11.5–15.5)
WBC: 10.5 10*3/uL (ref 4.0–10.5)

## 2018-04-08 LAB — PROTIME-INR
INR: 1.08
Prothrombin Time: 13.9 seconds (ref 11.4–15.2)

## 2018-04-08 LAB — RENAL FUNCTION PANEL
Albumin: 2.1 g/dL — ABNORMAL LOW (ref 3.5–5.0)
Anion gap: 7 (ref 5–15)
BUN: <5 mg/dL — ABNORMAL LOW (ref 6–20)
CO2: 42 mmol/L — AB (ref 22–32)
Calcium: 8.6 mg/dL — ABNORMAL LOW (ref 8.9–10.3)
Chloride: 85 mmol/L — ABNORMAL LOW (ref 98–111)
Creatinine, Ser: 0.41 mg/dL — ABNORMAL LOW (ref 0.61–1.24)
GFR calc Af Amer: >60 mL/min (ref >60)
GFR calc non Af Amer: >60 mL/min (ref >60)
Glucose, Bld: 96 mg/dL (ref 70–99)
Phosphorus: 3.5 mg/dL (ref 2.5–4.6)
Potassium: 4.2 mmol/L (ref 3.5–5.1)
SODIUM: 134 mmol/L — AB (ref 135–145)

## 2018-04-08 LAB — MAGNESIUM: Magnesium: 1.6 mg/dL — ABNORMAL LOW (ref 1.7–2.4)

## 2018-04-08 MED ORDER — IOPAMIDOL (ISOVUE-300) INJECTION 61%
INTRAVENOUS | Status: AC
  Administered 2018-04-08: 20 mL
  Filled 2018-04-08: qty 50

## 2018-04-08 MED ORDER — LIDOCAINE HCL (PF) 1 % IJ SOLN
INTRAMUSCULAR | Status: AC | PRN
  Administered 2018-04-08: 10 mL

## 2018-04-08 MED ORDER — LIDOCAINE HCL 1 % IJ SOLN
INTRAMUSCULAR | Status: AC
  Filled 2018-04-08: qty 20

## 2018-04-08 MED ORDER — FENTANYL CITRATE (PF) 100 MCG/2ML IJ SOLN
INTRAMUSCULAR | Status: AC | PRN
  Administered 2018-04-08: 50 ug via INTRAVENOUS
  Administered 2018-04-08: 100 ug via INTRAVENOUS
  Administered 2018-04-08: 50 ug via INTRAVENOUS

## 2018-04-08 MED ORDER — FENTANYL CITRATE (PF) 100 MCG/2ML IJ SOLN
INTRAMUSCULAR | Status: AC
  Filled 2018-04-08: qty 4

## 2018-04-08 MED ORDER — MIDAZOLAM HCL 2 MG/2ML IJ SOLN
INTRAMUSCULAR | Status: AC
  Filled 2018-04-08: qty 4

## 2018-04-08 MED ORDER — CEFAZOLIN SODIUM-DEXTROSE 2-4 GM/100ML-% IV SOLN
INTRAVENOUS | Status: AC
  Administered 2018-04-08: 2 g via INTRAVENOUS
  Filled 2018-04-08: qty 100

## 2018-04-08 MED ORDER — HYDROMORPHONE HCL 1 MG/ML IJ SOLN
1.0000 mg | INTRAMUSCULAR | Status: DC | PRN
  Administered 2018-04-08 – 2018-04-12 (×20): 1 mg via INTRAVENOUS
  Filled 2018-04-08 (×20): qty 1

## 2018-04-08 MED ORDER — MIDAZOLAM HCL 2 MG/2ML IJ SOLN
INTRAMUSCULAR | Status: AC | PRN
  Administered 2018-04-08: 2 mg via INTRAVENOUS
  Administered 2018-04-08 (×2): 0.5 mg via INTRAVENOUS

## 2018-04-08 MED ORDER — OSMOLITE 1.2 CAL PO LIQD
1000.0000 mL | ORAL | Status: DC
  Administered 2018-04-09 – 2018-04-11 (×3): 1000 mL
  Filled 2018-04-08 (×5): qty 1000

## 2018-04-08 MED ORDER — JEVITY 1.2 CAL PO LIQD: 1000.0000 mL | ORAL | Status: DC

## 2018-04-08 MED ORDER — MAGNESIUM SULFATE 4 GM/100ML IV SOLN
4.0000 g | Freq: Once | INTRAVENOUS | Status: AC
  Administered 2018-04-08: 4 g via INTRAVENOUS
  Filled 2018-04-08: qty 100

## 2018-04-08 NOTE — Procedures (Signed)
Interventional Radiology Procedure Note  Procedure: Placement of percutaneous 20F pull-through gastrostomy tube. Complications: None Recommendations: - NPO except for sips and chips remainder of today and overnight - Maintain G-tube to LWS until tomorrow morning  - May advance diet as tolerated and begin using tube tomorrow morning  Signed,  Davon Abdelaziz K. Nevan Creighton, MD   

## 2018-04-08 NOTE — Progress Notes (Signed)
   Subjective: Patient was seen this afternoon after PEG tube. He was seen comfortably sleeping in bed. His wife was at the bedside and reports she is familiar with PEG tube feeds. He had no complaints after the procedure and has been doing well.  Objective:  Vital signs in last 24 hours: Vitals:   04/07/18 0741 04/07/18 0808 04/07/18 1706 04/07/18 2336  BP: (!) 112/54  109/65 128/64  Pulse: 65  70 72  Resp:    16  Temp: 97.9 F (36.6 C)  98.1 F (36.7 C) 98.2 F (36.8 C)  TempSrc:      SpO2: 98% 99% 98% 96%  Weight:      Height:       General: seen laying comfortably in bed, NAD Lungs: no respiratory distress, on [place]  Assessment/Plan:  Principal Problem:   Acute on chronic respiratory failure (HCC) Active Problems:   Hyponatremia   Aspiration pneumonia (HCC)  Tony Berry is a 58 year old male with history of epiglotticcancer status post radiation and COPD who presented to the emergency departmentwith difficulty swallowing and shortness of breath.On admission the patient was febrile with a leukocytosis and a new right lower lobe infiltrate seen on chest x-ray. He was subsequently admitted for aspiration pneumonia.  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   Tony Craze, DO 04/08/2018, 6:43 AM Pager: [phone]

## 2018-04-08 NOTE — Progress Notes (Signed)
Pharmacy Antibiotic Note  Tony Berry is a 58 y.o. male admitted on 04/04/2018 with pneumonia.  Pharmacy has been consulted for Zosyn dosing.  Cultures ngtd, WBC down, afebrile  Plan: Continue Zosyn 3.375 gm IV q8h (4 hour infusion) Monitor clinical picture, renal function F/U C&S, abx deescalation / LOT  Height: 5\' 6"  (167.6 cm) Weight: 115 lb 1.3 oz (52.2 kg) IBW/kg (Calculated) : 63.8  Temp (24hrs), Avg:98.1 F (36.7 C), Min:97.9 F (36.6 C), Max:98.2 F (36.8 C)  Recent Labs  Lab 04/04/18 0917 04/04/18 0938 04/05/18 0223 04/06/18 0241 04/07/18 0232 04/08/18 0502  WBC 11.8*  --   --   --   --  10.5  CREATININE 0.50*  --  0.41* 0.39* 0.43* 0.41*  LATICACIDVEN  --  1.01  --   --   --   --     Estimated Creatinine Clearance: 75.2 mL/min (A) (by C-G formula based on SCr of 0.41 mg/dL (L)).    No Known Allergies  Alanda Slim, PharmD, [facility] Clinical Pharmacist Please see AMION for all Pharmacists' Contact Phone Numbers 04/08/2018, 8:18 AM

## 2018-04-08 NOTE — Progress Notes (Addendum)
Nutrition Consult/Follow Up  DOCUMENTATION CODES:   Severe malnutrition in context of chronic illness  INTERVENTION:    1/17 8:00 AM >>> start Osmolite 1.2 formula at 20 ml/hr and increase by 10 ml every 12 hours to goal rate of 60 ml/hr  Provides 1728 kcals, 80 gm protein, 1180 ml free water daily   Monitor magnesium, potassium, and phosphorus daily for at least 3 days, MD to replete as needed, as pt is at risk for refeeding syndrome   NUTRITION DIAGNOSIS:   Severe Malnutrition related to chronic illness(COPD, esophageal cancer s/p radiation) as evidenced by severe fat depletion, severe muscle depletion, energy intake < or equal to 75% for > or equal to 1 month, ongoing  GOAL:   Patient will meet greater than or equal to 90% of their needs, currently unmet  MONITOR:   PO intake, Supplement acceptance, Labs, Skin, Weight trends  ASSESSMENT:   58 yo Male with PMh of esophageal cancer status post radiation and COPD who presented to the emergency department with difficulty swallowing and shortness of breath.  RD spoke with pt's wife; pt lethargic; s/p G-tube per IR. Wife reports pt hasn't been eating much here in the hospital. Wife shares she spoke with an RD at [facility].  Wife has bottles of TF formula at home; unable to provide product name. Pt's G-tube is to [facility] at this time; okay to use tube tomorrow morning. Labs & medications reviewed. Mg 1.6 (L). Na 134 (L).  RD placed TF orders for tomorrow AM. Spoke with RN regarding nutrition care plan.  Diet Order:   Diet Order            Diet NPO time specified Except for: Ice Chips  Diet effective now             EDUCATION NEEDS:   No education needs have been identified at this time  Skin:  Skin Assessment: Reviewed RN Assessment  Last BM:  1/15  Height:   Ht Readings from Last 1 Encounters:  04/04/18 5\' 6"  (1.676 m)   Weight:   Wt Readings from Last 1 Encounters:  04/04/18 52.2 kg   BMI:  Body  mass index is 18.57 kg/m.  Estimated Nutritional Needs:   Kcal:  1600-1800  Protein:  80-95 gm  Fluid:  1.6-1.8 L  Arthur Holms, RD, LDN Pager #: [phone] After-Hours Pager #: [phone]

## 2018-04-08 NOTE — Care Management Note (Addendum)
Case Management Note  Patient Details  Name: Tony Berry MRN: [id number] Date of Birth: 28-Jun-1960  Subjective/Objective:     From home with wife, asp pna, peg tube placed today by IR 1/16, will try peg tube feeds tomorrow, so most likely dc will be on Saturday.  He has home oxygen with [facility] 2 liters as needed, may need new oxygen order prior to dc.  NCM spoke with wife , she states she knows how to do the bolus feeds ,she has done them before and he does not need a HHRN, the patient also states he does not need HHRN for tube feeds.  NCM informed MD that patient will need ambulatory sat because we will need oxygen order.                 Action/Plan: DC home when ready, wife knows how to do  Expected Discharge Date:                  Expected Discharge Plan:  Home/Self Care  In-House Referral:     Discharge planning Services  CM Consult  Post Acute Care Choice:    Choice offered to:     DME Arranged:    DME Agency:     HH Arranged:    [place] Agency:     Status of Service:  Completed, signed off  If discussed at [employer] of Stay Meetings, dates discussed:    Additional Comments:  [name], RN 04/08/2018, 4:18 PM

## 2018-04-09 LAB — RENAL FUNCTION PANEL
Albumin: 2.1 g/dL — ABNORMAL LOW (ref 3.5–5.0)
Anion gap: 6 (ref 5–15)
BUN: 6 mg/dL (ref 6–20)
CO2: 39 mmol/L — ABNORMAL HIGH (ref 22–32)
Calcium: 8.5 mg/dL — ABNORMAL LOW (ref 8.9–10.3)
Chloride: 89 mmol/L — ABNORMAL LOW (ref 98–111)
Creatinine, Ser: 0.45 mg/dL — ABNORMAL LOW (ref 0.61–1.24)
GFR calc Af Amer: >60 mL/min (ref >60)
GFR calc non Af Amer: >60 mL/min (ref >60)
Glucose, Bld: 69 mg/dL — ABNORMAL LOW (ref 70–99)
Phosphorus: 4.5 mg/dL (ref 2.5–4.6)
Potassium: 4.5 mmol/L (ref 3.5–5.1)
Sodium: 134 mmol/L — ABNORMAL LOW (ref 135–145)

## 2018-04-09 LAB — CULTURE, BLOOD (ROUTINE X 2)
Culture: NO GROWTH
Culture: NO GROWTH
Special Requests: ADEQUATE

## 2018-04-09 LAB — GLUCOSE, CAPILLARY: Glucose-Capillary: 82 mg/dL (ref 70–99)

## 2018-04-09 LAB — MAGNESIUM: Magnesium: 2 mg/dL (ref 1.7–2.4)

## 2018-04-09 NOTE — Care Management Note (Signed)
Case Management Note  Patient Details  Name: Tony Berry MRN: [id number] Date of Birth: Mar 12, 1961  Subjective/Objective:  From home with wife, asp pna, peg tube placed today by IR 1/16, will try peg tube feeds tomorrow, so most likely dc will be on Saturday.  He has home oxygen with [facility] 2 liters as needed, may need new oxygen order prior to dc.  NCM spoke with wife , she states she knows how to do the bolus feeds ,she has done them before and he does not need a HHRN, the patient also states he does not need HHRN for tube feeds.  NCM informed MD that patient will need ambulatory sat because we will need oxygen order.  New tube feed order in will need to be signed off by MD.  Brenton Grills with [facility] notified of new tube feeds- which will be done by bolus.                                              Action/Plan: DC home when ready. Expected Discharge Date:                  Expected Discharge Plan:  Home/Self Care  In-House Referral:  Nutrition  Discharge planning Services  CM Consult  Post Acute Care Choice:  Durable Medical Equipment Choice offered to:     DME Arranged:  Tube feeding DME Agency:  [place]:  NA [place] Agency:     Status of Service:  Completed, signed off  If discussed at [place] of Stay Meetings, dates discussed:    Additional Comments:  [name], RN 04/09/2018, 2:47 PM

## 2018-04-09 NOTE — Progress Notes (Signed)
Physical Therapy Treatment Patient Details Name: Tony Berry MRN: [id number] DOB: 1961-02-14 Today's Date: 04/09/2018    History of Present Illness 58 year old male with history of esophageal cancer status post radiation and COPD who presented to the emergency department with difficulty swallowing and shortness of breath. On admission the patient was febrile with a leukocytosis and a new right lower lobe infiltrate seen on chest x-ray. He was subsequently admitted for aspiration pneumonia. s/p PEG tube 1/16.    PT Comments    Patient progressing well towards PT goals. Reports discomfort and pain at PEG site. Tolerated gait training holding onto IV pole for support which is more for comfort and not necessity. Wanted to walk on RA however Sp02 dropped to 83%. Donned supplemental 02 and able to maintain Sp02 in high 80s to 90% on 4L/min 02. Encouraged increasing activity in an attempt to wean 02. Will follow.    Follow Up Recommendations  No PT follow up;Supervision for mobility/OOB     Equipment Recommendations  None recommended by PT    Recommendations for Other Services       Precautions / Restrictions Precautions Precautions: Fall Precaution Comments: watch sats Restrictions Weight Bearing Restrictions: No    Mobility  Bed Mobility               General bed mobility comments: seated EOB upon arrival  Transfers Overall transfer level: Needs assistance Equipment used: None Transfers: Sit to/from Stand Sit to Stand: Supervision         General transfer comment: Supervision for safety.   Ambulation/Gait Ambulation/Gait assistance: Supervision Gait Distance (Feet): 240 Feet Assistive device: IV Pole Gait Pattern/deviations: Step-through pattern;Decreased stride length Gait velocity: decreased   General Gait Details: Slow, mostly steady gait, walking on toes on RLE (premorbid). 1 standing rest breaks. Wanted to walk on RA, Sp02 dropped to 83%, donned 4L/min 02  and Sp02 remained in high 80s.    Stairs             Wheelchair Mobility    Modified Rankin (Stroke Patients Only)       Balance Overall balance assessment: Needs assistance;History of Falls Sitting-balance support: No upper extremity supported;Feet supported Sitting balance-Leahy Scale: Normal     Standing balance support: Single extremity supported Standing balance-Leahy Scale: Fair                              Cognition Arousal/Alertness: Awake/alert Behavior During Therapy: Flat affect Overall Cognitive Status: Within Functional Limits for tasks assessed                                 General Comments: Minimal verbalizations with low volume and mumbled speech at times. Appears WFL for basic tasks.      Exercises      General Comments General comments (skin integrity, edema, etc.): Sp02 remained in high 80s on 4L/min 02. Dropped to 83% on RA witha ctivity.       Pertinent Vitals/Pain Pain Assessment: Faces Faces Pain Scale: Hurts little more Pain Location: abdomen at PEG site  Pain Descriptors / Indicators: Sore Pain Intervention(s): Monitored during session;Repositioned;Patient requesting pain meds-RN notified    Home Living                      Prior Function  PT Goals (current goals can now be found in the care plan section) Progress towards PT goals: Progressing toward goals    Frequency    Min 3X/week      PT Plan Current plan remains appropriate    Co-evaluation              AM-PAC PT "6 Clicks" Mobility   Outcome Measure  Help needed turning from your back to your side while in a flat bed without using bedrails?: None Help needed moving from lying on your back to sitting on the side of a flat bed without using bedrails?: None Help needed moving to and from a bed to a chair (including a wheelchair)?: None Help needed standing up from a chair using your arms (e.g., wheelchair or  bedside chair)?: None Help needed to walk in hospital room?: A Little Help needed climbing 3-5 steps with a railing? : A Little 6 Click Score: 22    End of Session Equipment Utilized During Treatment: Oxygen Activity Tolerance: Treatment limited secondary to medical complications (Comment)(drop in Sp02 ) Patient left: in bed;with call bell/phone within reach;with nursing/sitter in room Nurse Communication: Mobility status PT Visit Diagnosis: Other abnormalities of gait and mobility (R26.89);History of falling (Z91.81)     Time: 0955-1010 PT Time Calculation (min) (ACUTE ONLY): 15 min  Charges:  $Gait Training: 8-22 mins                     Wray Kearns, PT, DPT Acute Rehabilitation Services Pager [phone] Office [place] 04/09/2018, 11:05 AM

## 2018-04-09 NOTE — Progress Notes (Signed)
Referring Physician(s): Dr Rebeca Alert, A  Supervising Physician: Aletta Edouard  Patient Status:  Tony Berry - In-pt  Chief Complaint:  Dysphagia Esophageal ca  Subjective:  G tube placed in IR yesterday Sore to pt But no complaints  Allergies: Patient has no known allergies.  Medications: Prior to Admission medications   Medication Sig Start Date End Date Taking? Authorizing Provider  aspirin 81 MG chewable tablet Chew 1 tablet (81 mg total) by mouth daily. Please do not take for five days before your gastrostomy tube placement procedure Patient taking differently: Chew 81 mg by mouth daily.  03/16/18  Yes Asencion Noble, MD  clopidogrel (PLAVIX) 75 MG tablet Take 1 tablet (75 mg total) by mouth daily. Please do not take for the 5 days before your gastrostomy tube procedure Patient taking differently: Take 75 mg by mouth daily.  03/15/18  Yes Asencion Noble, MD  fluticasone (FLONASE) 50 MCG/ACT nasal spray Place 1 spray into both nostrils daily.   Yes [provider]  Fluticasone-Umeclidin-Vilant (TRELEGY ELLIPTA) 100-62.5-25 MCG/INH AEPB Inhale 1 puff into the lungs daily.   Yes [provider]  Maltodextrin-Xanthan Gum (RESOURCE THICKENUP CLEAR) POWD Take 120 g by mouth as needed. 03/15/18  Yes Asencion Noble, MD  METHADONE HCL PO Take 115 mg by mouth daily.    Yes [provider]  nicotine (NICODERM CQ - DOSED IN MG/24 HOURS) 21 mg/24hr patch Place 1 patch (21 mg total) onto the skin daily. 03/16/18  Yes Asencion Noble, MD  polyethylene glycol [facility] / Floria Raveling) packet Take 17 g by mouth daily. Patient taking differently: Take 17 g by mouth daily as needed.  03/16/18  Yes Asencion Noble, MD  senna-docusate (SENOKOT-S) 8.6-50 MG tablet Take 1 tablet by mouth at bedtime as needed for mild constipation. 03/15/18  Yes Asencion Noble, MD  feeding supplement, ENSURE ENLIVE, (ENSURE ENLIVE) LIQD Take 237 mLs by mouth 3 (three) times  daily between meals. Patient not taking: Reported on 04/04/2018 03/15/18 04/14/18  Asencion Noble, MD     Vital Signs: BP 111/69   Pulse 83   Temp 97.8 F (36.6 C)   Resp 18   Ht 5\' 6"  (1.676 m)   Wt 115 lb 1.3 oz (52.2 kg)   SpO2 93%   BMI 18.57 kg/m   Physical Exam Vitals signs reviewed.  Skin:    General: Skin is warm and dry.     Comments: Site is clean and dry Mild tenderness no bleeding No hematoma     Imaging: Ct Chest W Contrast  Result Date: 04/06/2018 CLINICAL DATA:  History esophageal cancer status post radiation and history COPD. History of aspiration pneumonia. EXAM: CT CHEST WITH CONTRAST TECHNIQUE: Multidetector CT imaging of the chest was performed during intravenous contrast administration. CONTRAST:  31mL ISOVUE-300 IOPAMIDOL (ISOVUE-300) INJECTION 61% COMPARISON:  Chest CT 03/18/2018 FINDINGS: Cardiovascular: Normal heart size. No pericardial effusion. Thoracic aortic vascular calcifications. Mediastinum/Nodes: Similar-appearing prominent and mildly enlarged mediastinal and hilar adenopathy including a 0.7 cm superior mediastinal node (image 49; series 3), a 0.9 cm subcarinal node (image 93; series 3), a 1.1 cm right hilar node (image 90; series 3) and a 0.9 cm left hilar node. Stable appearance of the esophagus. Lungs/Pleura: Central airways are patent. Centrilobular and paraseptal emphysematous changes. Persistent bronchial wall thickening predominately involving the right middle lobe and bilateral lower lobes. Endobronchial material demonstrated within the right middle lobe, lingula, left lower lobe and right lower lobe. When compared  to prior exam there has been interval increase in tree-in-bud nodularity within the lingula and right lower lobe with improvement in the left lower lobe. Reference worsened tree-in-bud nodularity right lower lobe (image 108; series 4). Similar findings within the right middle lobe. No pleural effusion or pneumothorax. Upper  Abdomen: Unremarkable. Musculoskeletal: Thoracic spine degenerative changes. No aggressive or acute appearing osseous lesions. IMPRESSION: 1. Persistent waxing and waning of the basilar predominant tree-in-bud nodular opacities and associated consolidation with interval worsening involving the lingula and right lower lobe. Mild improvement within the left lower lobe. Worsening findings may be secondary to aspiration or multifocal pneumonia. 2. Mild mediastinal and hilar adenopathy likely reactive. 3. Aortic Atherosclerosis (ICD10-I70.0) and Emphysema (ICD10-J43.9). Electronically Signed   By: Lovey Newcomer M.D.   On: 04/06/2018 09:43   Ir Gastrostomy Tube Mod Sed  Result Date: 04/08/2018 INDICATION: 58 year old male with a history of esophageal cancer status post radiation therapy. He is currently admitted with dysphagia and aspiration pneumonia. Percutaneous gastrostomy tube is requested for enteral nutrition. EXAM: Fluoroscopically guided placement of percutaneous pull-through gastrostomy tube Interventional Radiologist:  Criselda Peaches, MD MEDICATIONS: 2 g Ancef; Antibiotics were administered within 1 hour of the procedure. ANESTHESIA/SEDATION: Versed 3 mg IV; Fentanyl 200 mcg IV Moderate Sedation Time:  13 minutes The patient was continuously monitored during the procedure by the interventional radiology nurse under my direct supervision. CONTRAST:  10 mL Isovue-300 FLUOROSCOPY TIME:  Fluoroscopy Time: 1 minutes 18 seconds (2 mGy). COMPLICATIONS: None immediate. PROCEDURE: Informed written consent was obtained from the patient after a thorough discussion of the procedural risks, benefits and alternatives. All questions were addressed. Maximal Sterile Barrier Technique was utilized including caps, mask, sterile gowns, sterile gloves, sterile drape, hand hygiene and skin antiseptic. A timeout was performed prior to the initiation of the procedure. Maximal barrier sterile technique utilized including caps,  mask, sterile gowns, sterile gloves, large sterile drape, hand hygiene, and chlorhexadine skin prep. An angled catheter was advanced over a wire under fluoroscopic guidance through the nose, down the esophagus and into the body of the stomach. The stomach was then insufflated with several 100 ml of air. Fluoroscopy confirmed location of the gastric bubble, as well as inferior displacement of the barium stained colon. Under direct fluoroscopic guidance, a single T-tack was placed, and the anterior gastric wall drawn up against the anterior abdominal wall. Percutaneous access was then obtained into the mid gastric body with an 18 gauge sheath needle. Aspiration of air, and injection of contrast material under fluoroscopy confirmed needle placement. An Amplatz wire was advanced in the gastric body and the access needle exchanged for a 9-French vascular sheath. A snare device was advanced through the vascular sheath and an Amplatz wire advanced through the angled catheter. The Amplatz wire was successfully snared and this was pulled up through the esophagus and out the mouth. A 20-French Alinda Dooms MIC-PEG tube was then connected to the snare and pulled through the mouth, down the esophagus, into the stomach and out to the anterior abdominal wall. Hand injection of contrast material confirmed intragastric location. The T-tack retention suture was then cut. The pull through peg tube was then secured with the external bumper and capped. The patient will be observed for several hours with the newly placed tube on low wall suction to evaluate for any post procedure complication. The patient tolerated the procedure well, there is no immediate complication. IMPRESSION: Successful placement of a 20 French pull through gastrostomy tube. Electronically Signed  By: Jacqulynn Cadet M.D.   On: 04/08/2018 12:28    Labs:  CBC: Recent Labs    03/15/18 0335 03/25/18 0924 04/04/18 0917 04/08/18 0502  WBC 5.2 8.0 11.8*  10.5  HGB 9.5* 12.1* 11.1* 10.5*  HCT 30.8* 40.6 35.4* 35.0*  PLT 197 405* 248 248    COAGS: Recent Labs    03/11/18 1435 03/25/18 0924 04/08/18 0502  INR 1.41 1.06 1.08    BMP: Recent Labs    04/06/18 0241 04/07/18 0232 04/08/18 0502 04/09/18 0224  NA 132* 132* 134* 134*  K 3.9 3.7 4.2 4.5  CL 86* 85* 85* 89*  CO2 36* 39* 42* 39*  GLUCOSE 72 138* 96 69*  BUN <5* <5* <5* 6  CALCIUM 8.5* 8.2* 8.6* 8.5*  CREATININE 0.39* 0.43* 0.41* 0.45*  GFRNONAA >60 >60 >60 >60  GFRAA >60 >60 >60 >60    LIVER FUNCTION TESTS: Recent Labs    03/13/18 0237 03/14/18 0350 03/15/18 0335 04/04/18 0917  04/06/18 0241 04/07/18 0232 04/08/18 0502 04/09/18 0224  BILITOT 0.4 0.6 0.3 0.9  --   --   --   --   --   AST 181* 85* 41 13*  --   --   --   --   --   ALT 215* 156* 111* 8  --   --   --   --   --   ALKPHOS 42 40 37* 52  --   --   --   --   --   PROT 5.7* 5.9* 5.6* 7.3  --   --   --   --   --   ALBUMIN 1.9* 1.9* 2.0* 2.5*   < > 2.3* 2.1* 2.1* 2.1*   < > = values in this interval not displayed.    Assessment and Plan:  May use G tube now  Electronically Signed: Lavonia Drafts, PA-C 04/09/2018, 12:25 PM   I spent a total of 15 Minutes at the the patient's bedside AND on the patient's Berry floor or unit, greater than 50% of which was counseling/coordinating care for G tube

## 2018-04-09 NOTE — Evaluation (Signed)
SATURATION QUALIFICATIONS: (This note is used to comply with regulatory documentation for home oxygen)  Patient Saturations on Room Air at Rest = 89%  Patient Saturations on Room Air while Ambulating = 84%  Patient Saturations on 3 Liters of oxygen while Ambulating = 92%  Please briefly explain why patient needs home oxygen: Patient cannot maintain sats greater than 88% without O2, while ambulating.

## 2018-04-09 NOTE — Progress Notes (Signed)
   Subjective: No overnight events. Tony Berry reports that his PEG tube placement went well yesterday. He denies abdominal pain, nausea, or vomiting. In fact, he states he feels hungry. He has been using the bathroom regularly.   Objective:  Vital signs in last 24 hours: Vitals:   04/08/18 1123 04/08/18 1221 04/08/18 1746 04/08/18 2325  BP: 134/80 107/71 119/73 126/74  Pulse: 91  70 75  Resp: 20 12  16   Temp:  97.7 F (36.5 C) 97.9 F (36.6 C) 97.9 F (36.6 C)  TempSrc:  Axillary Oral Oral  SpO2: 95% 96% 95% 91%  Weight:      Height:       Gen: Thin man, sitting up comfortably in bed, no distress CV: RRR, no murmurs Pulm: CTAB Abd: Soft, non-distended, mildly tender over PEG tube site. RLQ bandage clean, dry, and intact.  Assessment/Plan:  Principal Problem:   Acute on chronic respiratory failure (HCC) Active Problems:   Hyponatremia   Aspiration pneumonia (HCC)   Dysphagia  Tony Berry is a 58 year old male with history of epiglotticcancer status post radiation and COPD who presented to the emergency departmentwith difficulty swallowing and shortness of breath.On admission the patient was febrile with a leukocytosis and a new right lower lobe infiltrate seen on chest x-ray. He was subsequently admitted for aspiration pneumonia.  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  [name], DO 04/09/2018, 6:43 AM Pager: [phone]

## 2018-04-09 NOTE — Progress Notes (Signed)
Nutrition Brief Note  RD contacted per Neoma Laming, Case Manager for bolus TF recommendation: Osmolite 1.2 formula: 2 cartons 3 times daily via G-tube; provides 1710 kcals, 79 gm protein, 1170 ml free water daily; free water flushes at 200 ml 3 times daily to meet fluid needs.  Estimated Nutritional Needs:   Kcal:  1600-1800  Protein:  80-95 gm  Fluid:  1.6-1.8 L  Arthur Holms, RD, LDN Pager #: [phone] After-Hours Pager #: [phone]

## 2018-04-09 NOTE — Progress Notes (Signed)
Occupational Therapy Treatment Patient Details Name: Tony Berry MRN: [id number] DOB: 1960-06-16 Today's Date: 04/09/2018    History of present illness 58 year old male with history of esophageal cancer status post radiation and COPD who presented to the emergency department with difficulty swallowing and shortness of breath. On admission the patient was febrile with a leukocytosis and a new right lower lobe infiltrate seen on chest x-ray. He was subsequently admitted for aspiration pneumonia. s/p PEG tube 1/16.   OT comments  Pt progressing toward established OT goals. Pt currently min guard for dynamic balance pt manipulated blinds, picked item up off ground, and reached to upper cabinet in closet without LOB. Pt requires S for ADL completion while seated EOB. Pt unable to verbalize/demonstrate energy conservation strategies when initially prompted. Pt will continue to benefit from acute OT services to address energy conservation and increase safety and independence with ADL and functional mobility for safe d/c home with no OT follow up. Will continue to follow acutely.   Follow Up Recommendations  No OT follow up;Supervision - Intermittent    Equipment Recommendations  None recommended by OT    Recommendations for Other Services      Precautions / Restrictions Precautions Precautions: Fall Precaution Comments: watch sats Restrictions Weight Bearing Restrictions: No       Mobility Bed Mobility               General bed mobility comments: seated EOB upon arrival  Transfers Overall transfer level: Needs assistance Equipment used: None Transfers: Sit to/from Stand Sit to Stand: Supervision         General transfer comment: supervision for safety    Balance Overall balance assessment: Mild deficits observed, not formally tested Sitting-balance support: No upper extremity supported;Feet supported Sitting balance-Leahy Scale: Normal     Standing balance support:  No upper extremity supported;During functional activity Standing balance-Leahy Scale: Good Standing balance comment: minguard for safety pt able to reach up to higher cabinet in closet and able to pick item up off ground                           ADL either performed or assessed with clinical judgement   ADL Overall ADL's : At baseline     Grooming: Supervision/safety;Standing       Lower Body Bathing: Supervison/ safety;Sit to/from stand   Upper Body Dressing : Supervision/safety   Lower Body Dressing: Supervision/safety;Sit to/from stand   Toilet Transfer: Supervision/safety;Ambulation   Toileting- Clothing Manipulation and Hygiene: Supervision/safety;Sit to/from stand   Tub/ Banker: Min guard   Functional mobility during ADLs: Min guard General ADL Comments: min guard for dynamic balance, pt manipulated blinds, picked item up off ground, and reached to upper cabinet in closet without LOB;pt reports feeling at baseline     Vision       Perception     Praxis      Cognition Arousal/Alertness: Awake/alert Behavior During Therapy: Flat affect Overall Cognitive Status: Within Functional Limits for tasks assessed                                 General Comments: pt more communicative today        Exercises     Shoulder Instructions       General Comments VSS throughout session, pt on 3lnc;continued discussion about energy conservation strategies, pt unable to provide strategy when prompted  initially;pt excited to have peg tube and be able to have some food on stomach    Pertinent Vitals/ Pain       Pain Assessment: No/denies pain Faces Pain Scale: Hurts little more Pain Location: abdomen at PEG site  Pain Descriptors / Indicators: Sore Pain Intervention(s): Monitored during session;Repositioned;Patient requesting pain meds-RN notified  Home Living                                          Prior  Functioning/Environment              Frequency  Min 2X/week        Progress Toward Goals  OT Goals(current goals can now be found in the care plan section)  Progress towards OT goals: Progressing toward goals  Acute Rehab OT Goals Patient Stated Goal: to get back home OT Goal Formulation: With patient Time For Goal Achievement: 04/19/18 Potential to Achieve Goals: Good ADL Goals Pt Will Perform Grooming: with modified independence;standing Pt Will Perform Lower Body Dressing: with modified independence;sit to/from stand Pt Will Perform Toileting - Clothing Manipulation and hygiene: with modified independence;sit to/from stand Pt Will Perform Tub/Shower Transfer: with modified independence;ambulating;shower seat Additional ADL Goal #1: Pt will demonstrate at least one energy conservation technique during ADL task completion.  Plan Discharge plan remains appropriate    Co-evaluation                 AM-PAC OT "6 Clicks" Daily Activity     Outcome Measure   Help from another person eating meals?: None Help from another person taking care of personal grooming?: None Help from another person toileting, which includes using toliet, bedpan, or urinal?: A Little Help from another person bathing (including washing, rinsing, drying)?: None Help from another person to put on and taking off regular upper body clothing?: None Help from another person to put on and taking off regular lower body clothing?: A Little 6 Click Score: 22    End of Session Equipment Utilized During Treatment: Gait belt;Oxygen  OT Visit Diagnosis: Muscle weakness (generalized) (M62.81)   Activity Tolerance Patient tolerated treatment well   Patient Left in chair;with call bell/phone within reach   Nurse Communication Mobility status        Time: 1140-1200 OT Time Calculation (min): 20 min  Charges: OT General Charges $OT Visit: 1 Visit OT Treatments $Self Care/Home Management : 8-22  mins  Dorinda Hill OTR/L Acute Rehabilitation Services Office: [place] 04/09/2018, 2:14 PM

## 2018-04-10 DIAGNOSIS — Z9621 Cochlear implant status: Secondary | ICD-10-CM

## 2018-04-10 DIAGNOSIS — C321 Malignant neoplasm of supraglottis: Secondary | ICD-10-CM

## 2018-04-10 DIAGNOSIS — Z931 Gastrostomy status: Secondary | ICD-10-CM

## 2018-04-10 DIAGNOSIS — E46 Unspecified protein-calorie malnutrition: Secondary | ICD-10-CM

## 2018-04-10 LAB — RENAL FUNCTION PANEL
Albumin: 2 g/dL — ABNORMAL LOW (ref 3.5–5.0)
Anion gap: 9 (ref 5–15)
BUN: 10 mg/dL (ref 6–20)
CO2: 38 mmol/L — ABNORMAL HIGH (ref 22–32)
Calcium: 8.6 mg/dL — ABNORMAL LOW (ref 8.9–10.3)
Chloride: 86 mmol/L — ABNORMAL LOW (ref 98–111)
Creatinine, Ser: 0.49 mg/dL — ABNORMAL LOW (ref 0.61–1.24)
GFR calc Af Amer: >60 mL/min (ref >60)
GFR calc non Af Amer: >60 mL/min (ref >60)
Glucose, Bld: 126 mg/dL — ABNORMAL HIGH (ref 70–99)
Phosphorus: 3.4 mg/dL (ref 2.5–4.6)
Potassium: 4.1 mmol/L (ref 3.5–5.1)
Sodium: 133 mmol/L — ABNORMAL LOW (ref 135–145)

## 2018-04-10 LAB — GLUCOSE, CAPILLARY
GLUCOSE-CAPILLARY: 192 mg/dL — AB (ref 70–99)
Glucose-Capillary: 132 mg/dL — ABNORMAL HIGH (ref 70–99)
Glucose-Capillary: 134 mg/dL — ABNORMAL HIGH (ref 70–99)
Glucose-Capillary: 153 mg/dL — ABNORMAL HIGH (ref 70–99)
Glucose-Capillary: 170 mg/dL — ABNORMAL HIGH (ref 70–99)
Glucose-Capillary: 187 mg/dL — ABNORMAL HIGH (ref 70–99)
Glucose-Capillary: 220 mg/dL — ABNORMAL HIGH (ref 70–99)

## 2018-04-10 LAB — MAGNESIUM: Magnesium: 1.8 mg/dL (ref 1.7–2.4)

## 2018-04-10 MED ORDER — IBUPROFEN 100 MG/5ML PO SUSP
600.0000 mg | Freq: Four times a day (QID) | ORAL | Status: DC | PRN
  Filled 2018-04-10: qty 30

## 2018-04-10 MED ORDER — METHADONE HCL 10 MG PO TABS
115.0000 mg | ORAL_TABLET | Freq: Every day | ORAL | Status: DC
  Administered 2018-04-10 – 2018-04-12 (×3): 115 mg
  Filled 2018-04-10 (×6): qty 12

## 2018-04-10 MED ORDER — PHENOL 1.4 % MT LIQD
1.0000 | OROMUCOSAL | Status: DC | PRN
  Administered 2018-04-10 – 2018-04-11 (×2): 1 via OROMUCOSAL
  Filled 2018-04-10: qty 177

## 2018-04-10 NOTE — Progress Notes (Signed)
   Subjective: Patient having some discomfort around the PEG tube site. He otherwise is tolerating tube feeds without acute issues. He would like something to help with the pain. We discussed using antiinflammatories like Toradol but he tells me he does not want this medication because he doesn't like it. We will try PO NSAIDs through his PEG. Discussed that his labs are currently stable but we need to watch him for another 1-2 days to ensure he does not experience refeeding syndrome. All questions and concerns addressed.    Objective: Vital signs in last 24 hours: Vitals:   04/09/18 1118 04/09/18 1707 04/09/18 2327 04/10/18 0700  BP:  116/70 135/78   Pulse:  69 70   Resp:  20 17   Temp:  97.8 F (36.6 C)    TempSrc:  Oral    SpO2: 93% 91% 93%   Weight:    47.7 kg  Height:       Gen: Thin male, in no acute distress Pulm: Diminished air movement but no wheezing or crackles  Abd: Tenderness to palpation around the PEG, bandage clean and dry   Assessment/Plan:  Tony Berry is a 58 year old male with history of epiglotticcancer status post radiation and COPD who presented to the emergency departmentwith difficulty swallowing and shortness of breath.On admission the patient was febrile with a leukocytosis and a new right lower lobe infiltrate seen on chest x-ray. He was subsequently admitted for aspiration pneumonia.  Aspiration pneumonia Acute on Chronic Hypoxic Respiratory failure - Zosyn day #7, stop antibiotics after today  -Blood cultures NGTD -Patient will need home oxygen (qualifying note on 1/17). Will send home oxygen order.   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 Potassium, Mag, and Phos stable this  AM  OUD - Continue115mg  methadone qd  Dispo: Anticipated discharge in approximately 2-3 day(s) once patient on tube feeds.   Ina Homes, MD 04/10/2018, 7:37 AM

## 2018-04-11 DIAGNOSIS — Z8701 Personal history of pneumonia (recurrent): Secondary | ICD-10-CM

## 2018-04-11 LAB — RENAL FUNCTION PANEL
Albumin: 2.1 g/dL — ABNORMAL LOW (ref 3.5–5.0)
Anion gap: 7 (ref 5–15)
BUN: 7 mg/dL (ref 6–20)
CO2: 37 mmol/L — ABNORMAL HIGH (ref 22–32)
Calcium: 8.6 mg/dL — ABNORMAL LOW (ref 8.9–10.3)
Chloride: 88 mmol/L — ABNORMAL LOW (ref 98–111)
Creatinine, Ser: 0.43 mg/dL — ABNORMAL LOW (ref 0.61–1.24)
GFR calc non Af Amer: >60 mL/min (ref >60)
Glucose, Bld: 137 mg/dL — ABNORMAL HIGH (ref 70–99)
Phosphorus: 3.6 mg/dL (ref 2.5–4.6)
Potassium: 4.4 mmol/L (ref 3.5–5.1)
Sodium: 132 mmol/L — ABNORMAL LOW (ref 135–145)

## 2018-04-11 LAB — GLUCOSE, CAPILLARY
Glucose-Capillary: 107 mg/dL — ABNORMAL HIGH (ref 70–99)
Glucose-Capillary: 120 mg/dL — ABNORMAL HIGH (ref 70–99)
Glucose-Capillary: 135 mg/dL — ABNORMAL HIGH (ref 70–99)
Glucose-Capillary: 148 mg/dL — ABNORMAL HIGH (ref 70–99)
Glucose-Capillary: 87 mg/dL (ref 70–99)

## 2018-04-11 LAB — MAGNESIUM: Magnesium: 1.6 mg/dL — ABNORMAL LOW (ref 1.7–2.4)

## 2018-04-11 MED ORDER — MAGNESIUM SULFATE 4 GM/100ML IV SOLN
4.0000 g | Freq: Once | INTRAVENOUS | Status: AC
  Administered 2018-04-11: 4 g via INTRAVENOUS
  Filled 2018-04-11: qty 100

## 2018-04-11 NOTE — Progress Notes (Signed)
   Subjective: No overnight events. Tony Berry reports that he feels well except for some pain around his feeding tube. All of his questions were answered.  Objective: Vital signs in last 24 hours: Vitals:   04/10/18 2315 04/11/18 0400 04/11/18 0737 04/11/18 0904  BP: 124/77   105/70  Pulse: 76   77  Resp: 17   20  Temp: 98.2 F (36.8 C)   97.8 F (36.6 C)  TempSrc: Oral     SpO2: 93%  93% 95%  Weight:  49.1 kg    Height:       Gen: Thin male, in no acute distress Pulm: Diminished air movement but no wheezing or crackles  Abd: Tenderness to palpation around the PEG, bandage clean and dry   Assessment/Plan:  Tony Berry is a 58 year old male with history of epiglotticcancer status post radiation and COPD who presented to the emergency departmentwith difficulty swallowing and shortness of breath.On admission the patient was febrile with a leukocytosis and a new right lower lobe infiltrate seen on chest x-ray. He was subsequently admitted for aspiration pneumonia.  Aspiration pneumonia Acute on Chronic Hypoxic Respiratory failure - Completed 7 days of Zosyn -Blood cultures NGTD -Home oxygen order has been sent (qualifying note on 1/17).   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 Potassium and Phos stable this AM. Mg low, will replace.   OUD - Continue115mg  methadone qd  Dispo: Anticipated discharge in approximately 1-2 day(s) once patient on tube feeds.   Ina Homes, MD 04/11/2018, 9:19 AM

## 2018-04-11 NOTE — Progress Notes (Signed)
C/o abd pain.  100 mls residual from _PEG tub.  1 mg dilaudid given IV and TF on hold per pt request.

## 2018-04-11 NOTE — Progress Notes (Signed)
Medicine attending: I examined this patient today together with resident physician Dr. Ina Homes and I concur with his evaluation and management plan. Abdomen is soft but still tender in the area of the newly placed gastric feeding tube. He is tolerating advancement of the tube feeds. We reviewed with him today that he will need close follow-up.  I believe the majority of the changes that we saw on recent CT chest were inflammatory and not neoplastic.  However follow-up CT scan in about 3 months is indicated and should be done either through his radiation oncologist.  Plan was to also refer him to medical oncology in [place] but he states he never saw that Dr. Impression: 1.  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 2.  Swallowing dysfunction secondary to cancer and treatment 3.  Recurrent aspiration pneumonia secondary to 1 and 2 4.  Severe protein calorie malnutrition now status post gastric feeding tube placement. Anticipate discharge to home tomorrow.

## 2018-04-12 DIAGNOSIS — Z681 Body mass index (BMI) 19 or less, adult: Secondary | ICD-10-CM

## 2018-04-12 DIAGNOSIS — J9621 Acute and chronic respiratory failure with hypoxia: Secondary | ICD-10-CM

## 2018-04-12 LAB — RENAL FUNCTION PANEL
Albumin: 2.2 g/dL — ABNORMAL LOW (ref 3.5–5.0)
Anion gap: 10 (ref 5–15)
BUN: 10 mg/dL (ref 6–20)
CALCIUM: 8.6 mg/dL — AB (ref 8.9–10.3)
CO2: 34 mmol/L — ABNORMAL HIGH (ref 22–32)
Chloride: 87 mmol/L — ABNORMAL LOW (ref 98–111)
Creatinine, Ser: 0.4 mg/dL — ABNORMAL LOW (ref 0.61–1.24)
GFR calc Af Amer: >60 mL/min (ref >60)
GFR calc non Af Amer: >60 mL/min (ref >60)
Glucose, Bld: 76 mg/dL (ref 70–99)
Phosphorus: 3.7 mg/dL (ref 2.5–4.6)
Potassium: 4.8 mmol/L (ref 3.5–5.1)
SODIUM: 131 mmol/L — AB (ref 135–145)

## 2018-04-12 LAB — MAGNESIUM: Magnesium: 2 mg/dL (ref 1.7–2.4)

## 2018-04-12 LAB — GLUCOSE, CAPILLARY
GLUCOSE-CAPILLARY: 134 mg/dL — AB (ref 70–99)
Glucose-Capillary: 102 mg/dL — ABNORMAL HIGH (ref 70–99)
Glucose-Capillary: 169 mg/dL — ABNORMAL HIGH (ref 70–99)

## 2018-04-12 MED ORDER — OSMOLITE 1 CAL PO LIQD: 474.0000 mL | Freq: Three times a day (TID) | ORAL | 3 refills | Status: DC

## 2018-04-12 MED ORDER — FREE WATER: 200.0000 mL | Freq: Three times a day (TID) | 5 refills | Status: AC

## 2018-04-12 MED ORDER — POLYETHYLENE GLYCOL 3350 17 G PO PACK: 34.0000 g | PACK | Freq: Once | ORAL | Status: DC

## 2018-04-12 MED ORDER — OXYCODONE HCL 5 MG/5ML PO SOLN: 10.0000 mg | ORAL | 0 refills | Status: AC | PRN

## 2018-04-12 MED ORDER — SENNOSIDES 8.8 MG/5ML PO SYRP
5.0000 mL | ORAL_SOLUTION | Freq: Once | ORAL | Status: AC
  Administered 2018-04-12: 5 mL
  Filled 2018-04-12: qty 5

## 2018-04-12 MED ORDER — OXYCODONE HCL 5 MG PO CAPS: 5.0000 mg | ORAL_CAPSULE | ORAL | 0 refills | Status: DC | PRN

## 2018-04-12 MED ORDER — GLYCERIN (LAXATIVE) 2.1 G RE SUPP
1.0000 | RECTAL | Status: DC | PRN
  Administered 2018-04-12: 1 via RECTAL
  Filled 2018-04-12 (×2): qty 1

## 2018-04-12 NOTE — Progress Notes (Signed)
Pt cont to have severe abd pain.  Attempted to reconnect TF cath and unavle to keep to keep it connect.  Pressure pushing TF line out of cather.  Md notified

## 2018-04-12 NOTE — Progress Notes (Signed)
A Went by to see the patient as he was having continued abdominal pain.  He had just returned from trying to pass a very hard stool with little success.  He said it was painful trying to pass the stool.  He reports not having a bowel movement for some time.  He has been receiving 17 g of MiraLAX daily, now on increased opioid therapy after PEG basement.  Nurse reports increased back pressure through PEG tube when first opened but no signs of occlusion or obstruction.  On physical exam he is not in distress he has some mild tenderness around his PEG site.  He is nondistended.  No guarding nonsurgical abdomen, no signs of infection or cellulitis under bandage.    P -Give 34 g of MiraLAX and 1 dose of Senokot -Glycerin suppository as needed if this fails -Preferentially give some ibuprofen for pain until BM no doses received so far

## 2018-04-12 NOTE — Plan of Care (Signed)
Pt on tube feeding

## 2018-04-12 NOTE — Plan of Care (Signed)
Pt c/p constipation

## 2018-04-12 NOTE — Progress Notes (Signed)
   Subjective: Overnight patient reported continued abdominal pain and constipation. He was given a suppository and senokot and reports having a bowel movement overnight. He states that he feels better after having a BM. He is continuing to have an aching pain around his PEG tube site. He reports he is tolerating his tube feeds. Denies n/v.   Objective:  Vital signs in last 24 hours: Vitals:   04/11/18 0904 04/11/18 1714 04/11/18 2328 04/12/18 0356  BP: 105/70 111/70 122/75   Pulse: 77 76 76   Resp: 20 (!) 22    Temp: 97.8 F (36.6 C) 98.7 F (37.1 C) 97.8 F (36.6 C)   TempSrc:      SpO2: 95% 100% 96%   Weight:    49.2 kg  Height:       General: thin, frail elderly man, seen sitting uncomfortably in bed due to aching abdominal pain Pulm: Diminished air movement but no wheezing or crackles  Abdominal: clean and dry around PEG tube, tenderness around PEG tube site  Assessment/Plan:  Principal Problem:   Acute on chronic respiratory failure (HCC) Active Problems:   Protein-calorie malnutrition, severe   Hyponatremia   Aspiration pneumonia (HCC)   Dysphagia  Tony Berry is a 57 year old male with history of epiglotticcancer status post radiation and COPD who presented to the emergency departmentwith difficulty swallowing and shortness of breath.On admission the patient was febrile with a leukocytosis and a new right lower lobe infiltrate seen on chest x-ray. He was subsequently admitted for aspiration pneumonia.  Aspiration pneumonia Acute on Chronic Hypoxic Respiratory failure - Completed 7 days of Zosyn -Blood cultures NGTD -Home oxygen order has been sent (qualifying note on 1/17).   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 Potassium,  Phos and Mag stable this AM  OUD - Continue115mg  methadone qd  Dispo: Patient is medically stable for discharge today.  [name], DO 04/12/2018, 6:41 AM Pager: [phone]

## 2018-04-12 NOTE — Progress Notes (Signed)
Occupational Therapy Treatment Patient Details Name: Tony Berry MRN: [id number] DOB: [birth year] Today's Date: 04/12/2018    History of present illness 58 year old male with history of esophageal cancer status post radiation and COPD who presented to the emergency department with difficulty swallowing and shortness of breath. On admission the patient was febrile with a leukocytosis and a new right lower lobe infiltrate seen on chest x-ray. He was subsequently admitted for aspiration pneumonia. s/p PEG tube 1/16.   OT comments  Completed education with pt regarding energy conservation and reducing risk of falls at home. OT goals met.   Follow Up Recommendations  No OT follow up;Supervision - Intermittent    Equipment Recommendations  None recommended by OT    Recommendations for Other Services      Precautions / Restrictions Precautions Precautions: Fall Precaution Comments: watch sats       Mobility Bed Mobility                  Transfers                      Balance Overall balance assessment: No apparent balance deficits (not formally assessed)                                         ADL either performed or assessed with clinical judgement   ADL                                         General ADL Comments: Completing ADL tasks with set up. Reviewed energy conservation strategies. Handout issued and reviewed. Pt verbalized understanding. Pt asking about showering with his peg - deferred to nursing.      Vision       Perception     Praxis      Cognition Arousal/Alertness: Awake/alert Behavior During Therapy: WFL for tasks assessed/performed Overall Cognitive Status: Within Functional Limits for tasks assessed                                          Exercises     Shoulder Instructions       General Comments VSS throughout    Pertinent Vitals/ Pain       Pain Assessment: No/denies  pain  Home Living                                          Prior Functioning/Environment              Frequency           Progress Toward Goals  OT Goals(current goals can now be found in the care plan section)  Progress towards OT goals: Goals met/education completed, patient discharged from OT  Acute Rehab OT Goals Patient Stated Goal: to get back home OT Goal Formulation: With patient Time For Goal Achievement: 04/19/18 Potential to Achieve Goals: Good ADL Goals Pt Will Perform Grooming: with modified independence;standing Pt Will Perform Lower Body Dressing: with modified independence;sit to/from stand Pt Will Perform Toileting - Clothing Manipulation and hygiene: with modified independence;sit  to/from stand Pt Will Perform Tub/Shower Transfer: with modified independence;ambulating;shower seat Additional ADL Goal #1: Pt will demonstrate at least one energy conservation technique during ADL task completion.  Plan Discharge plan remains appropriate    Co-evaluation                 AM-PAC OT "6 Clicks" Daily Activity     Outcome Measure   Help from another person eating meals?: None Help from another person taking care of personal grooming?: None Help from another person toileting, which includes using toliet, bedpan, or urinal?: None Help from another person bathing (including washing, rinsing, drying)?: None Help from another person to put on and taking off regular upper body clothing?: None Help from another person to put on and taking off regular lower body clothing?: None 6 Click Score: 24    End of Session Equipment Utilized During Treatment: Oxygen  OT Visit Diagnosis: Muscle weakness (generalized) (M62.81)   Activity Tolerance Patient tolerated treatment well   Patient Left Other (comment)(wadlking around in room)   Nurse Communication          Time: 8718-3672 OT Time Calculation (min): 10 min  Charges: OT General  Charges $OT Visit: 1 Visit OT Treatments $Self Care/Home Management : 8-22 mins  Maurie Boettcher, OT/L   Acute OT Clinical Specialist [place] Pager [phone] Office [phone]    [facility] 04/12/2018, 9:22 AM

## 2018-04-12 NOTE — Progress Notes (Addendum)
Physical Therapy Treatment Patient Details Name: Tony Berry MRN: [id number] DOB: 07-26-1960 Today's Date: 04/12/2018    History of Present Illness 58 year old male with history of esophageal cancer status post radiation and COPD who presented to the emergency department with difficulty swallowing and shortness of breath. On admission the patient was febrile with a leukocytosis and a new right lower lobe infiltrate seen on chest x-ray. He was subsequently admitted for aspiration pneumonia. s/p PEG tube 1/16.    PT Comments    Pt progressing towards goals. Pt reports he was about to be discharged, so did not want to go too far from room. Pt practiced gait while negotiating oxygen tank and performed marching task to simulate stair navigation for home. Oxygen sats at 92% on 3L of oxygen after mobility tasks. Also reviewed seated HEP with pt and educated about frequency to perform at home. Pt eager to return home. Current recommendations appropriate. Will continue to follow acutely to maximize functional mobility independence and safety.    Follow Up Recommendations  No PT follow up;Supervision for mobility/OOB     Equipment Recommendations  None recommended by PT    Recommendations for Other Services       Precautions / Restrictions Precautions Precautions: Fall Precaution Comments: watch sats Restrictions Weight Bearing Restrictions: No    Mobility  Bed Mobility               General bed mobility comments: Sitting EOB upon entry   Transfers Overall transfer level: Needs assistance Equipment used: None Transfers: Sit to/from Stand Sit to Stand: Supervision         General transfer comment: supervision for safety  Ambulation/Gait Ambulation/Gait assistance: Supervision Gait Distance (Feet): 20 Feet Assistive device: (Oxygen tank) Gait Pattern/deviations: Step-through pattern;Decreased stride length Gait velocity: decreased   General Gait Details: Practiced  gait training while negotiating oxygen tank and tubing. Reviewed safety precautions to maintain while performing mobility tasks while negotiating oxygen tank. Pt required supervision for safety. Oxygen sats at 92% on 3L of oxygen    Stairs Stairs: Yes       General stair comments: Verbally reviewed step to stair navigation with use of rail. Practiced marching tasks while holding onto bed rail to simulate stair navigation at home as pt reports the NT was about to take him out to the car and he did not want to get too far from the room.    Wheelchair Mobility    Modified Rankin (Stroke Patients Only)       Balance Overall balance assessment: Mild deficits observed, not formally tested                                          Cognition Arousal/Alertness: Awake/alert Behavior During Therapy: WFL for tasks assessed/performed Overall Cognitive Status: Within Functional Limits for tasks assessed                                        Exercises General Exercises - Upper Extremity Shoulder Flexion: AROM;Both;10 reps;Seated(while performing pursed lip breathing. ) General Exercises - Lower Extremity Long Arc Quad: AROM;Both;10 reps;Seated Heel Raises: AROM;Both;10 reps;Seated    General Comments General comments (skin integrity, edema, etc.): VSS throughout       Pertinent Vitals/Pain Pain Assessment: No/denies pain    Home  Living                      Prior Function            PT Goals (current goals can now be found in the care plan section) Acute Rehab PT Goals Patient Stated Goal: to get back home PT Goal Formulation: With patient Time For Goal Achievement: 04/19/18 Potential to Achieve Goals: Good Progress towards PT goals: Progressing toward goals    Frequency    Min 3X/week      PT Plan Current plan remains appropriate    Co-evaluation              AM-PAC PT "6 Clicks" Mobility   Outcome Measure  Help  needed turning from your back to your side while in a flat bed without using bedrails?: None Help needed moving from lying on your back to sitting on the side of a flat bed without using bedrails?: None Help needed moving to and from a bed to a chair (including a wheelchair)?: None Help needed standing up from a chair using your arms (e.g., wheelchair or bedside chair)?: None Help needed to walk in hospital room?: A Little Help needed climbing 3-5 steps with a railing? : A Little 6 Click Score: 22    End of Session Equipment Utilized During Treatment: Oxygen Activity Tolerance: Patient tolerated treatment well Patient left: in bed;with call bell/phone within reach;with nursing/sitter in room(sitting EOB ) Nurse Communication: Mobility status PT Visit Diagnosis: Other abnormalities of gait and mobility (R26.89);History of falling (Z91.81)     Time: 5397-6734 PT Time Calculation (min) (ACUTE ONLY): 16 min  Charges:  $Therapeutic Exercise: 8-22 mins                     Leighton Ruff, PT, DPT  Acute Rehabilitation Services  Pager: [phone] Office: [phone]    Rudean Hitt 04/12/2018, 11:28 AM

## 2018-04-12 NOTE — Discharge Instructions (Signed)
Mr. Soderman,  Please start taking Osmolite 1.2 formula: 2 cartons 3 times daily via your PEG tube. I also want you to complete free water flushes at 200 ml 3 times daily to meet fluid needs. I have ordered you home oxygen and a home health RN to help assist you with all of this.  You can also continue to take oxycodone 10 ml every four hours for your pain for five days. You can alternate this with ibuprofen as needed.  Please follow up with your PCP in one week.

## 2018-04-28 DIAGNOSIS — E43 Unspecified severe protein-calorie malnutrition: Secondary | ICD-10-CM

## 2018-04-28 DIAGNOSIS — E871 Hypo-osmolality and hyponatremia: Secondary | ICD-10-CM

## 2018-04-28 DIAGNOSIS — J440 Chronic obstructive pulmonary disease with acute lower respiratory infection: Secondary | ICD-10-CM | POA: Diagnosis not present

## 2018-04-28 DIAGNOSIS — J189 Pneumonia, unspecified organism: Secondary | ICD-10-CM

## 2018-04-28 DIAGNOSIS — J9611 Chronic respiratory failure with hypoxia: Secondary | ICD-10-CM

## 2018-04-28 DIAGNOSIS — R079 Chest pain, unspecified: Secondary | ICD-10-CM

## 2018-04-29 DIAGNOSIS — J189 Pneumonia, unspecified organism: Secondary | ICD-10-CM | POA: Diagnosis not present

## 2018-04-29 DIAGNOSIS — R079 Chest pain, unspecified: Secondary | ICD-10-CM | POA: Diagnosis not present

## 2018-04-29 DIAGNOSIS — E43 Unspecified severe protein-calorie malnutrition: Secondary | ICD-10-CM | POA: Diagnosis not present

## 2018-04-29 DIAGNOSIS — J440 Chronic obstructive pulmonary disease with acute lower respiratory infection: Secondary | ICD-10-CM | POA: Diagnosis not present

## 2018-04-30 DIAGNOSIS — E43 Unspecified severe protein-calorie malnutrition: Secondary | ICD-10-CM | POA: Diagnosis not present

## 2018-04-30 DIAGNOSIS — J440 Chronic obstructive pulmonary disease with acute lower respiratory infection: Secondary | ICD-10-CM | POA: Diagnosis not present

## 2018-04-30 DIAGNOSIS — R079 Chest pain, unspecified: Secondary | ICD-10-CM | POA: Diagnosis not present

## 2018-04-30 DIAGNOSIS — J189 Pneumonia, unspecified organism: Secondary | ICD-10-CM | POA: Diagnosis not present

## 2018-05-04 ENCOUNTER — Encounter: Payer: Self-pay | Admitting: Physician Assistant

## 2018-05-06 ENCOUNTER — Ambulatory Visit: Payer: Medicaid Other | Admitting: Cardiology

## 2018-05-06 ENCOUNTER — Encounter: Payer: Self-pay | Admitting: Cardiology

## 2018-05-06 DIAGNOSIS — R0789 Other chest pain: Secondary | ICD-10-CM | POA: Diagnosis not present

## 2018-05-06 NOTE — Progress Notes (Signed)
Cardiology Office Note:    Date:  05/06/2018   ID:  Tony Berry, DOB 1960-10-21, MRN [id number]  PCP:  Tony Nakai, MD  Cardiologist:  Tony Lindau, MD   Referring MD: Tony Nakai, MD    ASSESSMENT:    No diagnosis found. PLAN:    In order of problems listed above:  1. Patient's condition is very advanced with multiple medical problems.  He is very frail and his prognosis appears poor.  His chest discomfort is atypical for coronary etiology.  He recently underwent some testing at [facility] with blood work and EKGs and a coronary event was ruled out.  He is comfortable and asymptomatic.  He is on dual antiplatelet therapy and is tolerating well.  At this time in view of atypical symptoms and very frail and multiple comorbid status I will not recommend any more evaluation or intervention.  He knows to go to the nearest emergency room for any concerning symptoms. 2. Patient will be seen in follow-up appointment in 6 months or earlier if the patient has any concerns    Medication Adjustments/Labs and Tests Ordered: Current medicines are reviewed at length with the patient today.  Concerns regarding medicines are outlined above.  No orders of the defined types were placed in this encounter.  No orders of the defined types were placed in this encounter.    History of Present Illness:    Tony Berry is a 58 y.o. male who is being seen today for the evaluation of chest discomfort at the request of Tony Nakai, MD.  Patient is a 58 year old gentleman.  He appears to be very frail and in very poor health.  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  He appears malnourished and very feeble and much older than stated age.  Patient mentions to me that he has had chest discomfort at times.  This is not related to exertion.  This chest discomfort happens sporadically and lasts only for a second or 2.  He has been treated with aspirin and Plavix.   Also the possibility of lung cancer with spread has been entertained and he is contemplating and is being considered for that evaluation.  At the time of my evaluation is alert awake oriented and in no distress.  I reviewed some of the [name] hospital records and he went there with chest pain and his EKGs and troponins were unremarkable according to the records available to me.  At the time of my evaluation, the patient is alert awake oriented and in no distress.  Past Medical History:  Diagnosis Date  . Cancer ([place])    Throat cancer  . Hypertension     Past Surgical History:  Procedure Laterality Date  . BACK SURGERY    . FOOT SURGERY    . IR GASTROSTOMY TUBE MOD SED  04/08/2018    Current Medications: Current Meds  Medication Sig  . amoxicillin (AMOXIL) 250 MG/5ML suspension Take 250 mg by mouth 3 (three) times daily.  Marland Kitchen aspirin 81 MG chewable tablet Chew 1 tablet (81 mg total) by mouth daily. Please do not take for five days before your gastrostomy tube placement procedure (Patient taking differently: Chew 81 mg by mouth daily. )  . citalopram (CELEXA) 20 MG tablet Take 20 mg by mouth daily.  . clopidogrel (PLAVIX) 75 MG tablet Take 1 tablet (75 mg total) by mouth daily. Please do not take for the 5 days  before your gastrostomy tube procedure (Patient taking differently: Take 75 mg by mouth daily. )  . feeding supplement (OSMOLITE 1 CAL) LIQD Take 474 mLs by mouth 3 (three) times daily with meals.  . fluticasone (FLONASE) 50 MCG/ACT nasal spray Place 1 spray into both nostrils daily.  . Fluticasone-Umeclidin-Vilant (TRELEGY ELLIPTA) 100-62.5-25 MCG/INH AEPB Inhale 1 puff into the lungs daily.  . Maltodextrin-Xanthan Gum (RESOURCE THICKENUP CLEAR) POWD Take 120 g by mouth as needed.  . METHADONE HCL PO Take 115 mg by mouth daily.   . nicotine (NICODERM CQ - DOSED IN MG/24 HOURS) 21 mg/24hr patch Place 1 patch (21 mg total) onto the skin daily.  . polyethylene glycol  (MIRALAX / GLYCOLAX) packet Take 17 g by mouth daily. (Patient taking differently: Take 17 g by mouth daily as needed. )  . predniSONE (DELTASONE) 10 MG tablet Take 10 mg by mouth daily with breakfast.  . senna-docusate (SENOKOT-S) 8.6-50 MG tablet Take 1 tablet by mouth at bedtime as needed for mild constipation.  . Water For Irrigation, Sterile (FREE WATER) SOLN Place 200 mLs into feeding tube 3 (three) times daily after meals.     Allergies:   Patient has no known allergies.   Social History   Socioeconomic History  . Marital status: Married    Spouse name: Not on file  . Number of children: Not on file  . Years of education: Not on file  . Highest education level: Not on file  Occupational History  . Not on file  Social Needs  . Financial resource strain: Not on file  . Food insecurity:    Worry: Not on file    Inability: Not on file  . Transportation needs:    Medical: Not on file    Non-medical: Not on file  Tobacco Use  . Smoking status: Former Smoker    Packs/day: 1.00    Years: 30.00    Pack years: 30.00    Types: Cigarettes    Start date: 03/11/1988    Last attempt to quit: 02/25/2018    Years since quitting: 0.1  . Smokeless tobacco: Never Used  . Tobacco comment: patient states he quit 2 weeks ago  Substance and Sexual Activity  . Alcohol use: No  . Drug use: No  . Sexual activity: Not on file  Lifestyle  . Physical activity:    Days per week: Not on file    Minutes per session: Not on file  . Stress: Not on file  Relationships  . Social connections:    Talks on phone: Not on file    Gets together: Not on file    Attends religious service: Not on file    Active member of club or organization: Not on file    Attends meetings of clubs or organizations: Not on file    Relationship status: Not on file  Other Topics Concern  . Not on file  Social History Narrative  . Not on file     Family History: The patient's family history is not on file.  ROS:    Please see the history of present illness.    All other systems reviewed and are negative.  EKGs/Labs/Other Studies Reviewed:    The following studies were reviewed today: I discussed my findings with the patient and reviewed records.   Recent Labs: 03/10/2018: B Natriuretic Peptide 693.7 03/11/2018: TSH 1.946 04/04/2018: ALT 8 04/08/2018: Hemoglobin 10.5; Platelets 248 04/12/2018: BUN 10; Creatinine, Ser 0.40; Magnesium 2.0; Potassium 4.8; Sodium  131  Recent Lipid Panel No results found for: CHOL, TRIG, HDL, CHOLHDL, VLDL, LDLCALC, LDLDIRECT  Physical Exam:    VS:  BP (!) 118/58 (BP Location: Right Arm, Patient Position: Sitting, Cuff Size: Normal)   Pulse 72   Ht 5\' 6"  (1.676 m)   Wt 113 lb (51.3 kg)   SpO2 96%   BMI 18.24 kg/m     Wt Readings from Last 3 Encounters:  05/06/18 113 lb (51.3 kg)  04/12/18 108 lb 7.5 oz (49.2 kg)  03/25/18 115 lb (52.2 kg)     GEN: Patient is in no acute distress HEENT: Normal NECK: No JVD; No carotid bruits LYMPHATICS: No lymphadenopathy CARDIAC: S1 S2 regular, 2/6 systolic murmur at the apex. RESPIRATORY:  Clear to auscultation without rales, wheezing or rhonchi  ABDOMEN: Soft, non-tender, non-distended MUSCULOSKELETAL:  No edema; No deformity  SKIN: Warm and dry NEUROLOGIC:  Alert and oriented x 3 PSYCHIATRIC:  Normal affect    Signed, Tony Lindau, MD  05/06/2018 10:03 AM    [place]

## 2018-05-06 NOTE — Patient Instructions (Signed)

## 2018-05-10 ENCOUNTER — Ambulatory Visit: Payer: Medicaid Other | Admitting: Physician Assistant

## 2018-05-11 ENCOUNTER — Ambulatory Visit (INDEPENDENT_AMBULATORY_CARE_PROVIDER_SITE_OTHER): Payer: Medicaid Other | Admitting: Pulmonary Disease

## 2018-05-11 ENCOUNTER — Encounter: Payer: Self-pay | Admitting: Pulmonary Disease

## 2018-05-11 VITALS — BP 122/70 | HR 78 | Ht 67.0 in | Wt 113.4 lb

## 2018-05-11 DIAGNOSIS — Z8589 Personal history of malignant neoplasm of other organs and systems: Secondary | ICD-10-CM | POA: Diagnosis not present

## 2018-05-11 DIAGNOSIS — J690 Pneumonitis due to inhalation of food and vomit: Secondary | ICD-10-CM | POA: Diagnosis not present

## 2018-05-11 DIAGNOSIS — Z931 Gastrostomy status: Secondary | ICD-10-CM | POA: Diagnosis not present

## 2018-05-11 DIAGNOSIS — R64 Cachexia: Secondary | ICD-10-CM

## 2018-05-11 DIAGNOSIS — Z87891 Personal history of nicotine dependence: Secondary | ICD-10-CM

## 2018-05-11 MED ORDER — REVEFENACIN 175 MCG/3ML IN SOLN: 3.0000 mL | Freq: Every day | RESPIRATORY_TRACT | 0 refills | Status: DC

## 2018-05-11 MED ORDER — FORMOTEROL FUMARATE 20 MCG/2ML IN NEBU: 20.0000 ug | INHALATION_SOLUTION | Freq: Two times a day (BID) | RESPIRATORY_TRACT | 0 refills | Status: DC

## 2018-05-11 MED ORDER — STARCH-MALTODEXTRIN PO PACK: 1.0000 | PACK | Freq: Four times a day (QID) | ORAL | 3 refills | Status: AC

## 2018-05-11 NOTE — Progress Notes (Signed)
Synopsis: Referred in Feb 2020 for abnormal CT chest by Cher Nakai, MD  Subjective:   PATIENT ID: Tony Berry GENDER: male DOB: [birth year], MRN: [id number]  Chief Complaint  Patient presents with  . Consult    Recently seen in ED. Currently using 3L while ambulating. States he has not seen any significance in his breathing.     PMH of head and neck cancer, recent admission to [facility] for recurrent aspiration pneumonia.  Patient was initially seen in January 2020 admitted for respiratory failure and treated for aspiration pneumonia.  CT scan with bibasilar infiltrates and small amount of mediastinal adenopathy.  CT imaging completed in February a few weeks later also reveals some resolution of the bilateral lower lobe infiltrates as well as smaller adenopathy.  Patient was referred to pulmonary clinic here for evaluation of possible metastatic disease to the chest.  Per the patient he was supposed to be using Thick-It for all thin with liquids.  He has been unable to obtain this due to cost or buying it over-the-counter.  He would like to have prescription of this today.  He has not had any outpatient follow-up with speech therapy.  At baseline he is dyspneic at rest.  He is continuing to use 2 to 4 L of nasal cannula O2 supplementation.  He has no longer smoking since his prior diagnosis of head neck cancer.  He is not sure whether or not he has had head neck cancer versus upper esophageal cancer he thinks may be both but has been presumptively cured after radiation therapy.  We will request records.  Patient denies chest pain, shortness of breath, hemoptysis.  He has been labeled with having COPD in the past however no prior PFTs.   Past Medical History:  Diagnosis Date  . Cancer ([place])    Throat cancer  . Head and neck cancer ([place])   . Hypertension      Family History  Problem Relation Age of Onset  . COPD Mother   . Liver cancer Mother   . Lung disease Mother    on HOT   . COPD Father   . Pneumonia Father   . CAD Father        CABG   . COPD Sister      Past Surgical History:  Procedure Laterality Date  . BACK SURGERY    . FOOT SURGERY    . IR GASTROSTOMY TUBE MOD SED  04/08/2018    Social History   Socioeconomic History  . Marital status: Married    Spouse name: Not on file  . Number of children: Not on file  . Years of education: Not on file  . Highest education level: Not on file  Occupational History  . Occupation: Curator    Comment: Retired   Scientific laboratory technician  . Financial resource strain: Not on file  . Food insecurity:    Worry: Not on file    Inability: Not on file  . Transportation needs:    Medical: Not on file    Non-medical: Not on file  Tobacco Use  . Smoking status: Former Smoker    Packs/day: 1.00    Years: 30.00    Pack years: 30.00    Types: Cigarettes    Start date: 03/11/1988    Last attempt to quit: 02/25/2018    Years since quitting: 0.2  . Smokeless tobacco: Never Used  . Tobacco comment: patient states he quit 2 weeks ago  Substance and Sexual Activity  . Alcohol use: Yes    Comment: 30 years ago   . Drug use: No  . Sexual activity: Not on file  Lifestyle  . Physical activity:    Days per week: Not on file    Minutes per session: Not on file  . Stress: Not on file  Relationships  . Social connections:    Talks on phone: Not on file    Gets together: Not on file    Attends religious service: Not on file    Active member of club or organization: Not on file    Attends meetings of clubs or organizations: Not on file    Relationship status: Not on file  . Intimate partner violence:    Fear of current or ex partner: Not on file    Emotionally abused: Not on file    Physically abused: Not on file    Forced sexual activity: Not on file  Other Topics Concern  . Not on file  Social History Narrative  . Not on file     No Known Allergies   Outpatient Medications Prior to Visit  Medication Sig  Dispense Refill  . aspirin 81 MG chewable tablet Chew 1 tablet (81 mg total) by mouth daily. Please do not take for five days before your gastrostomy tube placement procedure (Patient taking differently: Chew 81 mg by mouth daily. ) 30 tablet 0  . clopidogrel (PLAVIX) 75 MG tablet Take 1 tablet (75 mg total) by mouth daily. Please do not take for the 5 days before your gastrostomy tube procedure (Patient taking differently: Take 75 mg by mouth daily. ) 30 tablet 0  . feeding supplement (OSMOLITE 1 CAL) LIQD Take 474 mLs by mouth 3 (three) times daily with meals. 170640 mL 3  . fluticasone (FLONASE) 50 MCG/ACT nasal spray Place 1 spray into both nostrils daily.    . Maltodextrin-Xanthan Gum (RESOURCE THICKENUP CLEAR) POWD Take 120 g by mouth as needed. 2 Can 0  . METHADONE HCL PO Take 115 mg by mouth daily.     . polyethylene glycol (MIRALAX / GLYCOLAX) packet Take 17 g by mouth daily. 14 each 0  . senna-docusate (SENOKOT-S) 8.6-50 MG tablet Take 1 tablet by mouth at bedtime as needed for mild constipation. 30 tablet 0  . Water For Irrigation, Sterile (FREE WATER) SOLN Place 200 mLs into feeding tube 3 (three) times daily after meals. 18000 mL 5  . Fluticasone-Umeclidin-Vilant (TRELEGY ELLIPTA) 100-62.5-25 MCG/INH AEPB Inhale 1 puff into the lungs daily.    . nicotine (NICODERM CQ - DOSED IN MG/24 HOURS) 21 mg/24hr patch Place 1 patch (21 mg total) onto the skin daily. 28 patch 0  . amoxicillin (AMOXIL) 250 MG/5ML suspension Take 250 mg by mouth 3 (three) times daily.    . citalopram (CELEXA) 20 MG tablet Take 20 mg by mouth daily.    . predniSONE (DELTASONE) 10 MG tablet Take 10 mg by mouth daily with breakfast.     No facility-administered medications prior to visit.     Review of Systems  Constitutional: Negative for chills, fever, malaise/fatigue and weight loss.  HENT: Negative for hearing loss, sore throat and tinnitus.   Eyes: Negative for blurred vision and double vision.  Respiratory:  Positive for cough, sputum production, shortness of breath and wheezing. Negative for hemoptysis and stridor.   Cardiovascular: Negative for chest pain, palpitations, orthopnea, leg swelling and PND.  Gastrointestinal: Negative for abdominal pain, constipation, diarrhea, heartburn, nausea  and vomiting.  Genitourinary: Negative for dysuria, hematuria and urgency.  Musculoskeletal: Negative for joint pain and myalgias.  Skin: Negative for itching and rash.  Neurological: Negative for dizziness, tingling, weakness and headaches.  Endo/Heme/Allergies: Negative for environmental allergies. Does not bruise/bleed easily.  Psychiatric/Behavioral: Negative for depression. The patient is not nervous/anxious and does not have insomnia.   All other systems reviewed and are negative.    Objective:  Physical Exam Vitals signs reviewed.  Constitutional:      General: He is not in acute distress.    Appearance: He is well-developed.  HENT:     Head: Normocephalic and atraumatic.     Mouth/Throat:     Pharynx: No oropharyngeal exudate.  Eyes:     Conjunctiva/sclera: Conjunctivae normal.     Pupils: Pupils are equal, round, and reactive to light.  Neck:     Vascular: No JVD.     Trachea: No tracheal deviation.     Comments: Loss of supraclavicular fat Cardiovascular:     Rate and Rhythm: Normal rate and regular rhythm.     Heart sounds: S1 normal and S2 normal.     Comments: Distant heart tones Pulmonary:     Effort: No tachypnea or accessory muscle usage.     Breath sounds: No stridor. Decreased breath sounds (throughout all lung fields) and wheezing present. No rhonchi or rales.  Abdominal:     General: Bowel sounds are normal. There is no distension.     Palpations: Abdomen is soft.     Tenderness: There is no abdominal tenderness.  Musculoskeletal:        General: Deformity (muscle wasting ) present.  Skin:    General: Skin is warm and dry.     Capillary Refill: Capillary refill takes  less than 2 seconds.     Findings: No rash.     Comments: Multiple individually placed tattoos  Neurological:     Mental Status: He is alert and oriented to person, place, and time.  Psychiatric:        Behavior: Behavior normal.      Vitals:   05/11/18 1018  BP: 122/70  Pulse: 78  SpO2: 96%  Weight: 113 lb 6.4 oz (51.4 kg)  Height: 5\' 7"  (1.702 m)   96% on RA BMI Readings from Last 3 Encounters:  05/11/18 17.76 kg/m  05/06/18 18.24 kg/m  04/12/18 17.51 kg/m   Wt Readings from Last 3 Encounters:  05/11/18 113 lb 6.4 oz (51.4 kg)  05/06/18 113 lb (51.3 kg)  04/12/18 108 lb 7.5 oz (49.2 kg)     CBC    Component Value Date/Time   WBC 10.5 04/08/2018 0502   RBC 3.74 (L) 04/08/2018 0502   HGB 10.5 (L) 04/08/2018 0502   HCT 35.0 (L) 04/08/2018 0502   PLT 248 04/08/2018 0502   MCV 93.6 04/08/2018 0502   MCH 28.1 04/08/2018 0502   MCHC 30.0 04/08/2018 0502   RDW 12.5 04/08/2018 0502   LYMPHSABS 0.3 (L) 04/04/2018 0917   MONOABS 0.7 04/04/2018 0917   EOSABS 0.0 04/04/2018 0917   BASOSABS 0.0 04/04/2018 0917   Chest Imaging: CT chest 04/06/2018: Mildly enlarged mediastinal and hilar adenopathy, significant paraseptal and centrilobular emphysema, bibasilar tree-in-bud opacities consistent with multifocal pneumonia and likely secondary to aspiration. The patient's images have been independently reviewed by me.    Pulmonary Functions Testing Results: No flowsheet data found.  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 intake, status post PEG tube placement with a history of head neck cancer status post radiation therapy.  He is unsure if he had tonsillar cancer or esophageal cancer and I have none of these records from [place] today to be able to review.  I have reviewed the images today from [place] as well as the ones completed in January here at [facility].  It does appear that the bibasilar infiltrates look improved.  The mediastinal adenopathy is still present but not enlarged or changed in the short interval of time for imaging.  I think the biggest thing to conquer today would be referral to speech therapy and continued speech evaluation in the outpatient.  He has been eating and drinking and not necessarily following his dietary restrictions.  I think that recurrent aspiration into the airway is the most fatal problem that he is experiencing.  I also placed an order for Thick-It as the patient was unable to purchase this over-the-counter. He has had a PEG tube recently placed that they have started using for feeding.  He also has a follow-up planned with gastroenterology to consider evaluation for potential esophageal dilatation for concern of underlying stricture.  I think we should stop his Trelegy.  I am not sure that he is gaining any benefit from this as he is likely too weak to be able to pull in the medication necessary.  We will transition all of these medications to nebulized drugs.  I have placed these orders.  We will start him on Perforomist and Yupelri  Patient would like to establish care with oncology here.  I have placed a referral for medical oncology at [facility] health.  Greater than 50% of this patient's 45-minute office visit was been face-to-face discussing the above recommendations and treatment plan.   Current Outpatient Medications:  .   aspirin 81 MG chewable tablet, Chew 1 tablet (81 mg total) by mouth daily. Please do not take for five days before your gastrostomy tube placement procedure (Patient taking differently: Chew 81 mg by mouth daily. ), Disp: 30 tablet, Rfl: 0 .  clopidogrel (PLAVIX) 75 MG tablet, Take 1 tablet (75 mg total) by mouth daily. Please do not take for the 5 days before your gastrostomy tube procedure (Patient taking differently: Take 75 mg by mouth daily. ), Disp: 30 tablet, Rfl: 0 .  feeding supplement (OSMOLITE 1 CAL) LIQD, Take 474 mLs by mouth 3 (three) times daily with meals., Disp: 626948 mL, Rfl: 3 .  fluticasone (FLONASE) 50 MCG/ACT nasal spray, Place 1 spray into both nostrils daily., Disp: , Rfl:  .  Maltodextrin-Xanthan Gum (RESOURCE THICKENUP CLEAR) POWD, Take 120 g by mouth as needed., Disp: 2 Can, Rfl: 0 .  METHADONE HCL PO, Take 115 mg by mouth daily. , Disp: , Rfl:  .  polyethylene glycol (MIRALAX / GLYCOLAX) packet, Take 17 g by mouth daily., Disp: 14 each, Rfl: 0 .  senna-docusate (SENOKOT-S) 8.6-50 MG tablet,  Take 1 tablet by mouth at bedtime as needed for mild constipation., Disp: 30 tablet, Rfl: 0 .  Water For Irrigation, Sterile (FREE WATER) SOLN, Place 200 mLs into feeding tube 3 (three) times daily after meals., Disp: 18000 mL, Rfl: 5   Garner Nash, DO [facility] 05/11/2018 11:05 AM

## 2018-05-11 NOTE — Patient Instructions (Addendum)
Thank you for visiting Dr. Valeta Harms at [facility]. Today we recommend the following: Orders Placed This Encounter  Procedures  . CT Chest Wo Contrast  . Ambulatory referral to Oncology  . SLP eval and treat   Meds ordered this encounter  Medications  . STARCH-MALTO DEXTRIN PACK    Sig: Take 1 each by mouth 4 (four) times daily.    Dispense:  100 each    Refill:  3  . Revefenacin (YUPELRI) 175 MCG/3ML SOLN    Sig: Inhale 3 mLs into the lungs daily.    Dispense:  42 mL    Refill:  0  . formoterol (PERFOROMIST) 20 MCG/2ML nebulizer solution    Sig: Take 2 mLs (20 mcg total) by nebulization 2 (two) times daily.    Dispense:  20 mL    Refill:  0   Please stop your trelegy and switch to new nebulized medications.  Return in about 3 months (around 08/09/2018). with me or APP.

## 2018-05-17 ENCOUNTER — Other Ambulatory Visit (HOSPITAL_COMMUNITY): Payer: Self-pay | Admitting: Internal Medicine

## 2018-05-17 ENCOUNTER — Ambulatory Visit: Payer: Medicaid Other | Admitting: Physician Assistant

## 2018-05-17 ENCOUNTER — Encounter: Payer: Self-pay | Admitting: Physician Assistant

## 2018-05-17 VITALS — BP 110/58 | HR 92 | Ht 66.0 in | Wt 114.8 lb

## 2018-05-17 DIAGNOSIS — R1310 Dysphagia, unspecified: Secondary | ICD-10-CM

## 2018-05-17 DIAGNOSIS — J690 Pneumonitis due to inhalation of food and vomit: Secondary | ICD-10-CM

## 2018-05-17 DIAGNOSIS — Z923 Personal history of irradiation: Secondary | ICD-10-CM | POA: Diagnosis not present

## 2018-05-17 DIAGNOSIS — Z8589 Personal history of malignant neoplasm of other organs and systems: Secondary | ICD-10-CM | POA: Diagnosis not present

## 2018-05-17 NOTE — Progress Notes (Signed)
Subjective:    Patient ID: Tony Berry, male    DOB: April 03, 1960, 58 y.o.   MRN: [id number]  HPI   Tony Berry is a 58 year old white male, new to GI today, referred by Dr. Cher Nakai, for evaluation of dysphasia and recurrent aspiration pneumonia. Patient is a poor historian and history is given by his brother.  It sounds as if he was diagnosed with a pharyngeal or laryngeal cancer in 2018 or early 2019 and cared for in [facility].  He was treated with radiation and did not have surgery. Apparently he has been very hoarse since then. He had admission here in December 2019 and again in mid January 2020 with respiratory failure and bilateral pneumonias felt secondary to aspiration.  He required PEG placement by interventional radiology on April 08, 2018 for nutritional support.  He had evaluation by speech pathology during the December admission with finding of chronic pharyngeal dysphagia and areas of thickened abnormal pharyngeal walls well as epiglottitis, and peri-arytenoids.  Patient aspirated with all consistencies despite multiple postural adjustments.  PEG placement was later placed, and patient was to maintain a soft nectar thick liquid diet.  Per speech pathology ENT evaluation was recommended. Patient has just been evaluated by [facility]/Dr. [name] is there was also concern for possible metastatic disease in the chest.  CT scan in January had shown a small amount of mediastinal adenopathy.  Repeat imaging in February showed a smaller amount of adenopathy and resolution of the bilateral lobe infiltrates. Patient has been able to take a small amount of p.o.'s, and apparently at one point he had not been compliant with nectar thick but has been following that recently.  He is also receiving PEG feedings-bolus. His brother says he frequently coughs especially with attempts to swallow. Pulmonary I believe has him scheduled for follow-up CT scan and he is also to get established  with oncology here in [place]. Family says he did have a an ENT physician in [place] that he had seen when he was initially diagnosed.  They would like to [facility] all of [facility] [place]. Other medical issues include coronary artery disease, for which he is on Plavix and aspirin.  He has not had any stents placed as he had recently apparently been too high risk to pursue further work-up.  Also with COPD, chronic back pain and is maintained on methadone.   Review of Systems Pertinent positive and negative review of systems were noted in the above HPI section.  All other review of systems was otherwise negative.  Outpatient Encounter Medications as of 05/17/2018  Medication Sig  . aspirin 81 MG chewable tablet Chew 1 tablet (81 mg total) by mouth daily. Please do not take for five days before your gastrostomy tube placement procedure (Patient taking differently: Chew 81 mg by mouth daily. )  . clopidogrel (PLAVIX) 75 MG tablet Take 1 tablet (75 mg total) by mouth daily. Please do not take for the 5 days before your gastrostomy tube procedure (Patient taking differently: Take 75 mg by mouth daily. )  . feeding supplement (OSMOLITE 1 CAL) LIQD Take 474 mLs by mouth 3 (three) times daily with meals.  . fluticasone (FLONASE) 50 MCG/ACT nasal spray Place 1 spray into both nostrils daily.  . formoterol (PERFOROMIST) 20 MCG/2ML nebulizer solution Take 2 mLs (20 mcg total) by nebulization 2 (two) times daily.  . Maltodextrin-Xanthan Gum (RESOURCE THICKENUP CLEAR) POWD Take 120 g by mouth as  needed.  . METHADONE HCL PO Take 115 mg by mouth daily.   . polyethylene glycol (MIRALAX / GLYCOLAX) packet Take 17 g by mouth daily. (Patient taking differently: Take 17 g by mouth daily as needed. )  . Revefenacin (YUPELRI) 175 MCG/3ML SOLN Inhale 3 mLs into the lungs daily.  Marland Kitchen STARCH-MALTO DEXTRIN PACK Take 1 each by mouth 4 (four) times daily.  . Water For Irrigation, Sterile (FREE WATER)  SOLN Place 200 mLs into feeding tube 3 (three) times daily after meals.  . [DISCONTINUED] senna-docusate (SENOKOT-S) 8.6-50 MG tablet Take 1 tablet by mouth at bedtime as needed for mild constipation.   No facility-administered encounter medications on file as of 05/17/2018.    No Known Allergies Patient Active Problem List   Diagnosis Date Noted  . Chest discomfort 05/06/2018  . Dysphagia 04/08/2018  . Aspiration pneumonia ([place]) 04/04/2018  . Acute on chronic respiratory failure ([place]) 03/15/2018  . Carditis   . Troponin level elevated 03/11/2018  . Protein-calorie malnutrition, severe 03/11/2018  . Hyponatremia 03/11/2018  . Hypophosphatemia 03/11/2018  . Multifocal pneumonia 03/10/2018   Social History   Socioeconomic History  . Marital status: Married    Spouse name: Not on file  . Number of children: Not on file  . Years of education: Not on file  . Highest education level: Not on file  Occupational History  . Occupation: Curator    Comment: Retired   Scientific laboratory technician  . Financial resource strain: Not on file  . Food insecurity:    Worry: Not on file    Inability: Not on file  . Transportation needs:    Medical: Not on file    Non-medical: Not on file  Tobacco Use  . Smoking status: Former Smoker    Packs/day: 1.00    Years: 30.00    Pack years: 30.00    Types: Cigarettes    Start date: 03/11/1988    Last attempt to quit: 02/25/2018    Years since quitting: 0.2  . Smokeless tobacco: Former Network engineer and Sexual Activity  . Alcohol use: Yes    Comment: 30 years ago   . Drug use: No  . Sexual activity: Not on file  Lifestyle  . Physical activity:    Days per week: Not on file    Minutes per session: Not on file  . Stress: Not on file  Relationships  . Social connections:    Talks on phone: Not on file    Gets together: Not on file    Attends religious service: Not on file    Active member of club or organization: Not on file    Attends meetings of  clubs or organizations: Not on file    Relationship status: Not on file  . Intimate partner violence:    Fear of current or ex partner: Not on file    Emotionally abused: Not on file    Physically abused: Not on file    Forced sexual activity: Not on file  Other Topics Concern  . Not on file  Social History Narrative  . Not on file    Mr. Cadavid family history includes CAD in his father; COPD in his father, mother, and sister; Lung cancer in his mother; Pneumonia in his father.      Objective:    Vitals:   05/17/18 1001  BP: (!) 110/58  Pulse: 92    Physical Exam; well-developed, chronically ill-appearing cachectic white male in no acute distress, he  is on 4 L nasal cannula.  He is accompanied by his brother who gives most of the history...  BMI 18.5 HEENT; nontraumatic normocephalic EOMI PERRLA sclera anicteric oral mucosa moist, he is very hoarse and has difficulty speaking- he has scarring of the anterior neck from previous radiation.  Cardiovascular; regular rate and rhythm with S1-S2.  Pulmonary; decreased breath sounds bilaterally.  Abdomen ;scaphoid, nontender nondistended bowel sounds are active no palpable mass or hepatosplenomegaly there is a PEG tube in place in the epigastrium.  Rectal; exam not done, Extremities; no clubbing cyanosis or edema skin warm and dry, Neuropsych; alert and oriented, grossly nonfocal mood and affect appropriate       Assessment & Plan:   #73 58 year old white male with prior head and neck malignancy status post radiation.  Patient was managed in [place].  Patient cannot tell me whether he had laryngeal cancer or esophageal cancer but had radiation to his neck.  He did not require any further treatment. #2 severe dysphasia and recurrent aspiration pneumonia secondary to abnormal soft tissue thickening of the pharyngeal wall, epiglottis and peri-or retinoids noted with speech path evaluation. Question of secondary to radiation-induced fibrosis.   Patient aspirated with all consistencies and was unable to achieve laryngeal vestibular closure. #3 malnutrition-patient currently on a nectar thick liquid diet with supplemental feedings via PEG #4 COPD 5.  Respiratory failure-still requiring 4 L nasal O2, after hospitalization with bilateral aspiration pneumonias January 2020 #6 coronary artery disease-on aspirin and Plavix 7.  Chronic methadone use #8 mediastinal adenopathy noted on recent CT scan--further evaluation per pulmonary  Plan; We have requested patient's records from [place] [facility]-hopefully this will clarify the issue of what I believe was a laryngeal/pharyngeal cancer not an esophageal cancer.  We will refer patient to ENT. Happy to see this patient back if felt appropriate once he has been evaluated by ENT. Patient will be established with Dr. Ardis Hughs.  Amy S Esterwood PA-C 05/17/2018   Cc: Garnette Czech, MD

## 2018-05-17 NOTE — Patient Instructions (Signed)
If you are age 58 or older, your body mass index should be between 23-30. Your Body mass index is 18.53 kg/m. If this is out of the aforementioned range listed, please consider follow up with your Primary Care Provider.  If you are age 74 or younger, your body mass index should be between 19-25. Your Body mass index is 18.53 kg/m. If this is out of the aformentioned range listed, please consider follow up with your Primary Care Provider.    You are being referred to an ENT specialist.  We will contact you with an appointment.  Thank you for choosing me and [facility].   Amy Esterwood, PA-C

## 2018-05-18 ENCOUNTER — Telehealth: Payer: Self-pay | Admitting: Physician Assistant

## 2018-05-18 NOTE — Telephone Encounter (Signed)
Tony Berry from [facility] informed that she had received a fax but our fax # is written as our office #.  Our fax # is [phone].  Tony Berry requested to resend the fax regarding this pt.

## 2018-05-18 NOTE — Progress Notes (Signed)
I agree with the above note, plan 

## 2018-05-20 ENCOUNTER — Ambulatory Visit (INDEPENDENT_AMBULATORY_CARE_PROVIDER_SITE_OTHER): Payer: Medicaid Other | Admitting: Otolaryngology

## 2018-05-24 ENCOUNTER — Telehealth: Payer: Self-pay | Admitting: Cardiology

## 2018-05-24 MED ORDER — CLOPIDOGREL BISULFATE 75 MG PO TABS: 75.0000 mg | ORAL_TABLET | Freq: Every day | ORAL | 1 refills | Status: DC

## 2018-05-24 NOTE — Addendum Note (Signed)
Addended by: Ashok Norris on: 05/24/2018 03:59 PM   Modules accepted: Orders

## 2018-05-24 NOTE — Telephone Encounter (Signed)
Plavix 75 mg daily refilled.  

## 2018-05-24 NOTE — Telephone Encounter (Signed)
Call Plavix to [facility]/claims they have faxed it in 3 times with no reply

## 2018-06-02 NOTE — Consult Note (Signed)
Consultation Note Date: 06/02/2018   Patient Name: Tony Berry  DOB: June 06, 1960  MRN: [id number]  Age / Sex: 58 y.o., male  PCP: Cher Nakai, MD Referring Physician: No att. providers found  Reason for Consultation: Establishing goals of care  HPI/Patient Profile: 58 y.o. male  with past medical history of esophageal cancer (s/p radiation), COPD admitted on 03/10/2018 with recurrent aspiration pneumonia s/t radiation effects with dysphagia along with severe malnutrition. Plans for PEG placement.   Clinical Assessment and Goals of Care: I met today today with Tony Berry and no family or visitors at bedside. He is quite anxious mainly about discussion PEG placement and this is his main concern. He shares that he has had PEG previously but it was an awful experience with much pain and complications with leaking and was eventually removed. He will require medication for pain and anxiety for procedure. As anxious as he is he is prepared to move forward so that he can have nutrition and continue treatment for esophageal cancer.   He talks of desiring continued treatment with hopes of improvement. He would like to transfer his care to [facility] and says that his brother is willing to help transport him to appointments. He understands that his QOL is declining and that treatment process is going to be very difficult. He understands but desires to push forward even though he is very anxious about this process.   DNR is already in place but he wishes to continue otherwise aggressive care. Pain is controlled fairly well. He would like to further discuss together with his brother. I will reach out to his brother tomorrow to set meeting. Emotional support provided.   Late entry: Patient will be discharged now that he will need to be off Plavix for 5 days prior to PEG placement and no reason for him to wait and  spend holidays in hospital. Will need future palliative follow up.   Primary Decision Maker PATIENT    SUMMARY OF RECOMMENDATIONS   Very anxious about upcoming PEG placement Hopeful for improvement  Code Status/Advance Care Planning:  DNR   Symptom Management:   Per primary team.   For PEG s/t dysphagia and malnutrition.   Follows with methadone clinic for pain? Methadone dosing appears to be dosed more for substance abuse rather pain management. He describes his pain as well controlled on this regimen. No changes.   Palliative Prophylaxis:   Aspiration, Bowel Regimen and Frequent Pain Assessment  Psycho-social/Spiritual:   Desire for further Chaplaincy support:yes  Additional Recommendations: Caregiving  Support/Resources  Prognosis:   Unable to determine  Discharge Planning: Home with Palliative Services      Primary Diagnoses: Present on Admission: . Multifocal pneumonia . (Resolved) Transaminitis . Troponin level elevated . Protein-calorie malnutrition, severe . Hyponatremia . Hypophosphatemia . Acute on chronic respiratory failure ([place])   I have reviewed the medical record, interviewed the patient and family, and examined the patient. The following aspects are pertinent.  Past Medical History:  Diagnosis Date  .  Esophageal cancer ([place])   . Head and neck cancer ([place])   . Hypertension    Social History   Socioeconomic History  . Marital status: Married    Spouse name: Not on file  . Number of children: Not on file  . Years of education: Not on file  . Highest education level: Not on file  Occupational History  . Occupation: Curator    Comment: Retired   Scientific laboratory technician  . Financial resource strain: Not on file  . Food insecurity:    Worry: Not on file    Inability: Not on file  . Transportation needs:    Medical: Not on file    Non-medical: Not on file  Tobacco Use  . Smoking status: Former Smoker    Packs/day: 1.00    Years: 30.00     Pack years: 30.00    Types: Cigarettes    Start date: 03/11/1988    Last attempt to quit: 02/25/2018    Years since quitting: 0.2  . Smokeless tobacco: Former Network engineer and Sexual Activity  . Alcohol use: Yes    Comment: 30 years ago   . Drug use: No  . Sexual activity: Not on file  Lifestyle  . Physical activity:    Days per week: Not on file    Minutes per session: Not on file  . Stress: Not on file  Relationships  . Social connections:    Talks on phone: Not on file    Gets together: Not on file    Attends religious service: Not on file    Active member of club or organization: Not on file    Attends meetings of clubs or organizations: Not on file    Relationship status: Not on file  Other Topics Concern  . Not on file  Social History Narrative  . Not on file   Family History  Problem Relation Age of Onset  . COPD Mother   . Lung cancer Mother        on HOT   . COPD Father   . Pneumonia Father   . CAD Father        CABG   . COPD Sister   . Colon cancer Neg Hx    Scheduled Meds: Continuous Infusions: PRN Meds:. No Known Allergies Review of Systems  Constitutional: Positive for activity change and appetite change.  HENT: Positive for trouble swallowing.   Respiratory: Positive for cough.   Psychiatric/Behavioral: The patient is nervous/anxious.     Physical Exam Vitals signs and nursing note reviewed.  Constitutional:      Appearance: He is cachectic. He is ill-appearing.  Cardiovascular:     Rate and Rhythm: Normal rate and regular rhythm.  Pulmonary:     Effort: No tachypnea, accessory muscle usage or respiratory distress.  Abdominal:     General: Abdomen is flat.  Neurological:     Mental Status: He is alert and oriented to person, place, and time.     Vital Signs: BP 106/61 (BP Location: Right Arm)   Pulse 71   Temp 98.1 F (36.7 C) (Oral)   Resp 18   Ht '5\' 6"'  (1.676 m)   Wt 52.1 kg   SpO2 94%   BMI 18.54 kg/m  Pain Scale: 0-10    Pain Score: 0-No pain   SpO2: SpO2: 94 % O2 Device:SpO2: 94 % O2 Flow Rate: .O2 Flow Rate (L/min): 3 L/min  IO: Intake/output summary: No intake or output data in  the 24 hours ending 06/02/18 1523  LBM: Last BM Date: 03/11/18 Baseline Weight: Weight: 49.9 kg Most recent weight: Weight: 52.1 kg     Palliative Assessment/Data:   Flowsheet Rows     Most Recent Value  Intake Tab  Unit at Time of Referral  Cardiac/Telemetry Unit  Palliative Care Primary Diagnosis  Cancer  Date Notified  03/13/18  Palliative Care Type  New Palliative care  Reason for referral  Clarify Goals of Care  Date of Admission  03/10/18  Date first seen by Palliative Care  03/15/18  # of days Palliative referral response time  2 Day(s)  # of days IP prior to Palliative referral  3  Clinical Assessment  Psychosocial & Spiritual Assessment  Palliative Care Outcomes       Time Total: 50 min  Greater than 50%  of this time was spent counseling and coordinating care related to the above assessment and plan.  Signed by: Vinie Sill, NP Palliative Medicine Team Pager # [phone] (M-F 8a-5p) Team Phone # [phone] (Nights/Weekends)

## 2018-06-05 DIAGNOSIS — J441 Chronic obstructive pulmonary disease with (acute) exacerbation: Secondary | ICD-10-CM

## 2018-06-05 DIAGNOSIS — D72829 Elevated white blood cell count, unspecified: Secondary | ICD-10-CM

## 2018-06-05 DIAGNOSIS — J9691 Respiratory failure, unspecified with hypoxia: Secondary | ICD-10-CM | POA: Diagnosis not present

## 2018-06-05 DIAGNOSIS — I771 Stricture of artery: Secondary | ICD-10-CM

## 2018-06-05 DIAGNOSIS — J189 Pneumonia, unspecified organism: Secondary | ICD-10-CM | POA: Diagnosis not present

## 2018-06-06 DIAGNOSIS — J441 Chronic obstructive pulmonary disease with (acute) exacerbation: Secondary | ICD-10-CM | POA: Diagnosis not present

## 2018-06-06 DIAGNOSIS — I771 Stricture of artery: Secondary | ICD-10-CM | POA: Diagnosis not present

## 2018-06-06 DIAGNOSIS — J9691 Respiratory failure, unspecified with hypoxia: Secondary | ICD-10-CM | POA: Diagnosis not present

## 2018-06-06 DIAGNOSIS — J189 Pneumonia, unspecified organism: Secondary | ICD-10-CM | POA: Diagnosis not present

## 2018-06-10 ENCOUNTER — Telehealth: Payer: Self-pay | Admitting: Pulmonary Disease

## 2018-06-10 NOTE — Telephone Encounter (Signed)
Instructions      Return in about 3 months (around 08/09/2018).  Thank you for visiting Dr. Valeta Harms at [facility]. Today we recommend the following:    Orders Placed This Encounter  Procedures  . CT Chest Wo Contrast  . Ambulatory referral to Oncology  . SLP eval and treat       Meds ordered this encounter  Medications  . STARCH-MALTO DEXTRIN PACK    Sig: Take 1 each by mouth 4 (four) times daily.    Dispense:  100 each    Refill:  3  . Revefenacin (YUPELRI) 175 MCG/3ML SOLN    Sig: Inhale 3 mLs into the lungs daily.    Dispense:  42 mL    Refill:  0  . formoterol (PERFOROMIST) 20 MCG/2ML nebulizer solution    Sig: Take 2 mLs (20 mcg total) by nebulization 2 (two) times daily.    Dispense:  20 mL    Refill:  0   Please stop your trelegy and switch to new nebulized medications.  Return in about 3 months (around 08/09/2018). with me or APP.      ______________________________  Called patient unable to reach and unable to leave VM as phone kept ringing.

## 2018-06-11 MED ORDER — REVEFENACIN 175 MCG/3ML IN SOLN: 3.0000 mL | Freq: Every day | RESPIRATORY_TRACT | 2 refills | Status: DC

## 2018-06-11 MED ORDER — FORMOTEROL FUMARATE 20 MCG/2ML IN NEBU: 20.0000 ug | INHALATION_SOLUTION | Freq: Two times a day (BID) | RESPIRATORY_TRACT | 2 refills | Status: DC

## 2018-06-11 NOTE — Telephone Encounter (Signed)
Called and spoke with pt's wife [name] asking her if they gave pt samples of both Yupelri and Performist at [facility]. Georgie stated they only gave pt enough for 2 weeks. I stated to her that I would send Rx to preferred pharmacy for pt. Georgie expressed understanding. Rx of both Yupelri and Performist have been sent in for pt. Nothing further needed.

## 2018-06-13 DIAGNOSIS — Z515 Encounter for palliative care: Secondary | ICD-10-CM

## 2018-06-13 DIAGNOSIS — Z7189 Other specified counseling: Secondary | ICD-10-CM

## 2018-06-17 ENCOUNTER — Telehealth: Payer: Self-pay | Admitting: Pulmonary Disease

## 2018-06-17 NOTE — Telephone Encounter (Signed)
Medication name and strength: Yupelri 142mcg/3ML solution Provider: BI Pharmacy: [facility] Patient insurance ID: [id number] S  Was the PA started on CMM?  Today 06/17/18 on [place] [facility]  [phone] Called and spoke with Bubba Hales regarding initiating PA over the phone today for [facility] call reference number [id number] If yes, please enter the Key: (Key: [id number]) - [id number];  Timeframe for approval/denial: 5-7 business days for determination  Routing message to [place] for f/u on PA and review.

## 2018-06-23 NOTE — Telephone Encounter (Signed)
LMTCB. Please make patient aware the yupelri was denied by his insurance. I need to know whether he got the perforomist from the pharmacy and if he is using it.   Call made to [place] [facility], spoke with [place], PA was denied. Preferred nebulizer is ipratropium. Preferred inhalers include atrovent, bevespi, and spiriva. Call ref # [id number].

## 2018-06-25 NOTE — Telephone Encounter (Signed)
Call was transferred over to me via Judeen Hammans. I spoke with patient's wife [name]. She stated that the patient has not been able to get the Yupelri or Perforomist. She stated that the pharmacy stated there was an issue with the order and they would contact us.   Called [facility] and spoke to the pharmacist. He stated that the Perforomist needed a PA as well.   Called [place] [facility] to start a PA for the Perforomist. PA has been approved until 06/25/2019. PA# [id number].  [place] is aware of the approval but they will have to order the medication. Medication should be here by Monday.   Will route to [place] so she is aware.

## 2018-06-30 NOTE — Telephone Encounter (Signed)
Called [facility] to check status of Perforomist. Pharmacy tech stated he came by yesterday 06/29/2018 and picked up the [place], per [place], is denied by his insurance. She called [place] [facility] and discovered PA was also denied. Preferred nebulizer is ipratropium. Preferred inhalers include Atrovent, Bevespi, and Spiriva.   Dr. Valeta Harms, how would you like to move forward with the denial of Yupelri? Please advise. Thank you.

## 2018-07-01 MED ORDER — IPRATROPIUM-ALBUTEROL 0.5-2.5 (3) MG/3ML IN SOLN: 3.0000 mL | RESPIRATORY_TRACT | 3 refills | Status: DC | PRN

## 2018-07-01 NOTE — Telephone Encounter (Signed)
Called and spoke with patient and pt's wife regarding BI recommendations They both agreed to try the duoneb first Placed order for duoneb QID to pharmacy Pt verbalized understanding  Nothing further needed.

## 2018-07-01 NOTE — Telephone Encounter (Signed)
PCCM:  Ok to use Duonebs, scheduled QID if covered or Ipratropium + albuterol QID.   Thanks  [place], DO [facility] 07/01/2018 2:09 PM

## 2018-08-19 ENCOUNTER — Ambulatory Visit: Payer: Medicaid Other | Admitting: Pulmonary Disease

## 2018-09-02 ENCOUNTER — Telehealth: Payer: Self-pay | Admitting: Nurse Practitioner

## 2018-09-02 ENCOUNTER — Telehealth: Payer: Self-pay

## 2018-09-02 DIAGNOSIS — J690 Pneumonitis due to inhalation of food and vomit: Secondary | ICD-10-CM

## 2018-09-02 NOTE — Telephone Encounter (Signed)
Left message for patient to call to inform him of need for CXR as soon as possible.  Eduardo Osier, NP had a peer to peer review for a CT scan. Scan was denied and is scheduled for 09/22/18.  Eduardo Osier, NP was instructed on peer to peer that patient would need a CXR as soon as possible in order to see if CT would be approved.  This is Dr. Juline Patch patient and Kenney Houseman was app of the day handling peer to peer.    Patient will need to be informed that he needs the CXR as soon as possible in order to meet requirement for CT scan.  If patient agrees, please place CXR order for aspiration pneumonia follow up.  [place] has paperwork on peer to peer.    Routed to T. Nichols and Triage for review.

## 2018-09-02 NOTE — Telephone Encounter (Signed)
Dr. Valeta Harms,  Tony Berry - I got a denial on this patient's upcoming CT scan. Patient has follow up scheduled with you and will need a chest x ray at that to show worsening process before CT will be covered by medicaid. We will place order for chest x ray to be done at upcoming visit.

## 2018-09-03 ENCOUNTER — Ambulatory Visit: Payer: Medicaid Other

## 2018-09-03 NOTE — Telephone Encounter (Signed)
Returned call to patient's spouse She is in agreement to proceed with cxr. Order has been placed. She says it may be Wed before they can come by and have done. Nothing further needed.

## 2018-09-03 NOTE — Telephone Encounter (Signed)
Pt's wife is returning call.  [phone]

## 2018-09-08 NOTE — Telephone Encounter (Signed)
Waiting on pt to come and do cxr so it can be sent to his insurance Joellen Jersey

## 2018-09-09 NOTE — Telephone Encounter (Signed)
Pt will be coming by here on Friday 09/10/18 to do this cxr Joellen [place]

## 2018-09-10 ENCOUNTER — Ambulatory Visit (INDEPENDENT_AMBULATORY_CARE_PROVIDER_SITE_OTHER): Payer: Medicaid Other

## 2018-09-10 DIAGNOSIS — J690 Pneumonitis due to inhalation of food and vomit: Secondary | ICD-10-CM

## 2018-09-13 ENCOUNTER — Telehealth: Payer: Self-pay | Admitting: Pulmonary Disease

## 2018-09-13 NOTE — Telephone Encounter (Signed)
Pt was relayed the results of cxr today by Garnette Gunner. Pt has CT scheduled for 09/22/2018. Nothing further needed.

## 2018-09-15 NOTE — Telephone Encounter (Signed)
Pt came to the office and did cxr I have faxed it to the insurance to see if we can get the auth for the ct scan Tony Berry

## 2018-09-19 DIAGNOSIS — J441 Chronic obstructive pulmonary disease with (acute) exacerbation: Secondary | ICD-10-CM | POA: Diagnosis not present

## 2018-09-19 DIAGNOSIS — D72829 Elevated white blood cell count, unspecified: Secondary | ICD-10-CM

## 2018-09-19 DIAGNOSIS — E871 Hypo-osmolality and hyponatremia: Secondary | ICD-10-CM

## 2018-09-19 DIAGNOSIS — J189 Pneumonia, unspecified organism: Secondary | ICD-10-CM | POA: Diagnosis not present

## 2018-09-20 ENCOUNTER — Telehealth: Payer: Self-pay | Admitting: Pulmonary Disease

## 2018-09-20 DIAGNOSIS — J441 Chronic obstructive pulmonary disease with (acute) exacerbation: Secondary | ICD-10-CM | POA: Diagnosis not present

## 2018-09-20 NOTE — Telephone Encounter (Signed)
Had Ashley Akin, RN check PACs for me to see if there was a recent CT performed on pt since pt was just in hospital and she stated that pt had a CT angio performed 09/18/2018 which she saw in PACS.  Dr. Valeta Harms, please advise if pt still needs CT to be performed 7/1 or if it can be cancelled?

## 2018-09-20 NOTE — Telephone Encounter (Signed)
We did get it authorized Tony Berry

## 2018-09-20 NOTE — Telephone Encounter (Signed)
Pt has CT scan scheduled 7/1 and he is wanting to know if medicaid will be paying for this. PCCS, please advise on this for pt. Thanks!

## 2018-09-20 NOTE — Telephone Encounter (Signed)
Called and spoke with pt's wife Georgie. Stated to [place] that medicaid will pay for the CT to be performed. While speaking with [name], she stated that pt had to be admitted to [facility] Saturday, 6/27 and was released today 6/29. Per [name], she stated pt was admitted due to pna and she thinks a CT was performed on pt while he was at the hospital.  We are unable to see Oval Linsey in the Care Everywhere section of pt's chart. Stated to Archie to check pt's paperwork when they got home to see if a CT was in fact done while pt was at [facility].  Also stated to her we would try to check [place] to see if we can see that a CT was performed and told her that we would call her back but if she had not heard from [place] by mid afternoon tomorrow, 6/30 to contact our office as pt is scheduled for CT Wednesday, 7/1. Georgie verbalized understanding.

## 2018-09-21 NOTE — Telephone Encounter (Signed)
Pt's wife [name] is calling back [phone]

## 2018-09-21 NOTE — Telephone Encounter (Signed)
Pending update from Dr. Valeta Harms.

## 2018-09-21 NOTE — Telephone Encounter (Signed)
Spoke with patient's wife [name]. She was upset because she had received a call earlier stating that Mr. Savoca would not be able to see BI on the 7th for his PFT and OV. She stated that they were offered an appt on the same day with a NP but declined as they only wanted to see BI. She wondered if he even needs the CT now since that appointment has been canceled and not rescheduled.   Patient is currently scheduled for a CT tomorrow at 1130 here in [place]. They are travelling from [place] and will need to make arrangements for transportation.   BI, please advise. Thanks!

## 2018-09-22 ENCOUNTER — Inpatient Hospital Stay: Admission: RE | Admit: 2018-09-22 | Payer: Medicaid Other | Source: Ambulatory Visit

## 2018-09-22 ENCOUNTER — Telehealth: Payer: Self-pay | Admitting: Pulmonary Disease

## 2018-09-22 NOTE — Telephone Encounter (Signed)
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 If the patient wishes this can be done by his oncologist and today's testing can be is canceled.  However the imaging should be done at some point once he has established care with a new ENT or oncologist here at [facility].  Garner Nash, DO [facility] 09/22/2018 7:10 AM

## 2018-09-22 NOTE — Telephone Encounter (Signed)
In an encounter from today, 7/1 from [place] with Gaynelle Cage wanted to know if we were going to cancel the scan or if pt was still to have the scan. I read to her the info stated by [facility] and also stated to her that I would call pt to see if he wanted to cancel the CT or still have it performed since he did just have CTa performed while at hospital.  Unable to reach pt so message was left for pt to return call so we know what he wants to do. Due to there being the other encounter open from today, 7/1, I am closing this encounter.

## 2018-09-22 NOTE — Telephone Encounter (Signed)
Info stated by Dr. Valeta Harms from previous encounter in regards to pt's CT which is scheduled to day 7/1: [name], Tony Graves, DO to Valerie Salts, [place] . 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 If the patient wishes this can be done by his oncologist and today's testing can be is canceled.  However the imaging should be done at some point once he has established care with a new ENT or oncologist here at [facility].  Garner Nash, DO [facility] 09/22/2018 7:10 AM      Called and spoke with Tony Berry at [facility] stating this info to her. Told her that I would call pt to see if they still want to have CT performed today or if they want to cancel it and she verbalized understanding.  CT is currently scheduled at 11:30.  Attempted to call pt but unable to reach. Left message for pt to return call. Attempted to call pt's wife Tony Berry but line went straight to VM and unable to leave a message due to VM being full.  Since message was left on pt's phone, will await a return call from pt to see if he wants to cancel the CT or still have it performed. Will await a return call.

## 2018-09-22 NOTE — Telephone Encounter (Signed)
Checked to see if pt had CT performed and I did see that the CT was cancelled. Nothing further needed.

## 2018-09-28 ENCOUNTER — Ambulatory Visit: Payer: Medicaid Other | Admitting: Pulmonary Disease

## 2018-09-28 ENCOUNTER — Other Ambulatory Visit: Payer: Self-pay

## 2018-09-28 ENCOUNTER — Inpatient Hospital Stay (HOSPITAL_COMMUNITY)
Admission: EM | Admit: 2018-09-28 | Discharge: 2018-10-02 | DRG: 177 | Disposition: A | Discharge status: Home / Self Care | Payer: Medicaid Other | Attending: Student | Admitting: Student

## 2018-09-28 ENCOUNTER — Emergency Department (HOSPITAL_COMMUNITY): Payer: Medicaid Other

## 2018-09-28 ENCOUNTER — Encounter (HOSPITAL_COMMUNITY): Payer: Self-pay | Admitting: Emergency Medicine

## 2018-09-28 DIAGNOSIS — E43 Unspecified severe protein-calorie malnutrition: Secondary | ICD-10-CM | POA: Diagnosis not present

## 2018-09-28 DIAGNOSIS — G8929 Other chronic pain: Secondary | ICD-10-CM | POA: Diagnosis present

## 2018-09-28 DIAGNOSIS — W19XXXA Unspecified fall, initial encounter: Secondary | ICD-10-CM | POA: Diagnosis not present

## 2018-09-28 DIAGNOSIS — Z8589 Personal history of malignant neoplasm of other organs and systems: Secondary | ICD-10-CM

## 2018-09-28 DIAGNOSIS — D72825 Bandemia: Secondary | ICD-10-CM | POA: Diagnosis not present

## 2018-09-28 DIAGNOSIS — E441 Mild protein-calorie malnutrition: Secondary | ICD-10-CM

## 2018-09-28 DIAGNOSIS — Z923 Personal history of irradiation: Secondary | ICD-10-CM | POA: Diagnosis not present

## 2018-09-28 DIAGNOSIS — Z681 Body mass index (BMI) 19 or less, adult: Secondary | ICD-10-CM | POA: Diagnosis not present

## 2018-09-28 DIAGNOSIS — E871 Hypo-osmolality and hyponatremia: Secondary | ICD-10-CM | POA: Diagnosis present

## 2018-09-28 DIAGNOSIS — R1310 Dysphagia, unspecified: Secondary | ICD-10-CM

## 2018-09-28 DIAGNOSIS — E222 Syndrome of inappropriate secretion of antidiuretic hormone: Secondary | ICD-10-CM | POA: Diagnosis present

## 2018-09-28 DIAGNOSIS — Z825 Family history of asthma and other chronic lower respiratory diseases: Secondary | ICD-10-CM

## 2018-09-28 DIAGNOSIS — Z7982 Long term (current) use of aspirin: Secondary | ICD-10-CM | POA: Diagnosis not present

## 2018-09-28 DIAGNOSIS — Z87891 Personal history of nicotine dependence: Secondary | ICD-10-CM

## 2018-09-28 DIAGNOSIS — Z801 Family history of malignant neoplasm of trachea, bronchus and lung: Secondary | ICD-10-CM

## 2018-09-28 DIAGNOSIS — Z931 Gastrostomy status: Secondary | ICD-10-CM | POA: Diagnosis not present

## 2018-09-28 DIAGNOSIS — J9621 Acute and chronic respiratory failure with hypoxia: Secondary | ICD-10-CM | POA: Diagnosis not present

## 2018-09-28 DIAGNOSIS — R4702 Dysphasia: Secondary | ICD-10-CM | POA: Diagnosis present

## 2018-09-28 DIAGNOSIS — Y92009 Unspecified place in unspecified non-institutional (private) residence as the place of occurrence of the external cause: Secondary | ICD-10-CM | POA: Diagnosis not present

## 2018-09-28 DIAGNOSIS — Z7951 Long term (current) use of inhaled steroids: Secondary | ICD-10-CM | POA: Diagnosis not present

## 2018-09-28 DIAGNOSIS — J690 Pneumonitis due to inhalation of food and vomit: Secondary | ICD-10-CM | POA: Diagnosis not present

## 2018-09-28 DIAGNOSIS — C159 Malignant neoplasm of esophagus, unspecified: Secondary | ICD-10-CM | POA: Diagnosis present

## 2018-09-28 DIAGNOSIS — J9620 Acute and chronic respiratory failure, unspecified whether with hypoxia or hypercapnia: Secondary | ICD-10-CM | POA: Diagnosis present

## 2018-09-28 DIAGNOSIS — Z8249 Family history of ischemic heart disease and other diseases of the circulatory system: Secondary | ICD-10-CM | POA: Diagnosis not present

## 2018-09-28 DIAGNOSIS — I10 Essential (primary) hypertension: Secondary | ICD-10-CM | POA: Diagnosis present

## 2018-09-28 DIAGNOSIS — Z79899 Other long term (current) drug therapy: Secondary | ICD-10-CM

## 2018-09-28 DIAGNOSIS — J441 Chronic obstructive pulmonary disease with (acute) exacerbation: Secondary | ICD-10-CM | POA: Diagnosis present

## 2018-09-28 DIAGNOSIS — I739 Peripheral vascular disease, unspecified: Secondary | ICD-10-CM | POA: Diagnosis present

## 2018-09-28 DIAGNOSIS — Z20828 Contact with and (suspected) exposure to other viral communicable diseases: Secondary | ICD-10-CM | POA: Diagnosis present

## 2018-09-28 DIAGNOSIS — Z7902 Long term (current) use of antithrombotics/antiplatelets: Secondary | ICD-10-CM | POA: Diagnosis not present

## 2018-09-28 DIAGNOSIS — R109 Unspecified abdominal pain: Secondary | ICD-10-CM | POA: Diagnosis not present

## 2018-09-28 LAB — SARS CORONAVIRUS 2 BY RT PCR (HOSPITAL ORDER, PERFORMED IN ~~LOC~~ HOSPITAL LAB): SARS Coronavirus 2: NEGATIVE

## 2018-09-28 LAB — COMPREHENSIVE METABOLIC PANEL
ALT: 14 U/L (ref 0–44)
AST: 17 U/L (ref 15–41)
Albumin: 3.2 g/dL — ABNORMAL LOW (ref 3.5–5.0)
Alkaline Phosphatase: 54 U/L (ref 38–126)
Anion gap: 9 (ref 5–15)
BUN: 9 mg/dL (ref 6–20)
CO2: 42 mmol/L — ABNORMAL HIGH (ref 22–32)
Calcium: 9 mg/dL (ref 8.9–10.3)
Chloride: 75 mmol/L — ABNORMAL LOW (ref 98–111)
Creatinine, Ser: 0.43 mg/dL — ABNORMAL LOW (ref 0.61–1.24)
GFR calc Af Amer: >60 mL/min (ref >60)
GFR calc non Af Amer: >60 mL/min (ref >60)
Glucose, Bld: 82 mg/dL (ref 70–99)
Potassium: 4 mmol/L (ref 3.5–5.1)
Sodium: 126 mmol/L — ABNORMAL LOW (ref 135–145)
Total Bilirubin: 1.1 mg/dL (ref 0.3–1.2)
Total Protein: 7 g/dL (ref 6.5–8.1)

## 2018-09-28 LAB — CBC
HCT: 38.6 % — ABNORMAL LOW (ref 39.0–52.0)
Hemoglobin: 11.7 g/dL — ABNORMAL LOW (ref 13.0–17.0)
MCH: 28.3 pg (ref 26.0–34.0)
MCHC: 30.3 g/dL (ref 30.0–36.0)
MCV: 93.5 fL (ref 80.0–100.0)
Platelets: 293 10*3/uL (ref 150–400)
RBC: 4.13 MIL/uL — ABNORMAL LOW (ref 4.22–5.81)
RDW: 13.2 % (ref 11.5–15.5)
WBC: 23.8 10*3/uL — ABNORMAL HIGH (ref 4.0–10.5)
nRBC: 0 % (ref 0.0–0.2)

## 2018-09-28 LAB — LIPASE, BLOOD: Lipase: 19 U/L (ref 11–51)

## 2018-09-28 LAB — LACTIC ACID, PLASMA: Lactic Acid, Venous: 0.7 mmol/L (ref 0.5–1.9)

## 2018-09-28 MED ORDER — SODIUM CHLORIDE 0.9% FLUSH: 3.0000 mL | Freq: Once | INTRAVENOUS | Status: DC

## 2018-09-28 MED ORDER — PIPERACILLIN-TAZOBACTAM 3.375 G IVPB 30 MIN
3.3750 g | Freq: Once | INTRAVENOUS | Status: AC
  Administered 2018-09-28: 23:00:00 3.375 g via INTRAVENOUS
  Filled 2018-09-28: qty 50

## 2018-09-28 MED ORDER — IOHEXOL 300 MG/ML  SOLN
100.0000 mL | Freq: Once | INTRAMUSCULAR | Status: AC | PRN
  Administered 2018-09-28: 100 mL via INTRAVENOUS

## 2018-09-28 MED ORDER — VANCOMYCIN HCL IN DEXTROSE 1-5 GM/200ML-% IV SOLN
1000.0000 mg | Freq: Once | INTRAVENOUS | Status: AC
  Administered 2018-09-28: 23:00:00 1000 mg via INTRAVENOUS
  Filled 2018-09-28: qty 200

## 2018-09-28 NOTE — ED Provider Notes (Signed)
[facility] EMERGENCY DEPARTMENT Provider Note   CSN: [id number] Arrival date & time: 09/28/18  1844    History   Chief Complaint Chief Complaint  Patient presents with   Abdominal Pain    HPI Tony Berry is a 58 y.o. male.     HPI Patient presents feeling bad.  Has abdominal pain mild in the upper abdomen.  Has had a little bit of a cough.  Some shortness of breath.  Recently was at [facility] and was on antibiotics for pneumonia.  States he finished those up.  Also states that he has been on steroids.  Not having fever.  Has had recurrent aspiration pneumonia.  Has history of esophageal cancer.  Has a PEG tube.  States he fell on it and was worried he may have damaged it.  States he has been able to use it freely however.  No diarrhea.  No nausea or vomiting but has had "stomach problems."  He is on chronic oxygen at 3 or 4 L/min. Past Medical History:  Diagnosis Date   Esophageal cancer ([place])    Head and neck cancer ([place])    Hypertension     Patient Active Problem List   Diagnosis Date Noted   Goals of care, counseling/discussion    Palliative care encounter    Chest discomfort 05/06/2018   Dysphagia 04/08/2018   Aspiration pneumonia ([place]) 04/04/2018   Acute on chronic respiratory failure ([place]) 03/15/2018   Carditis    Troponin level elevated 03/11/2018   Protein-calorie malnutrition, severe 03/11/2018   Hyponatremia 03/11/2018   Hypophosphatemia 03/11/2018   Multifocal pneumonia 03/10/2018    Past Surgical History:  Procedure Laterality Date   BACK SURGERY  2008, 2011   FOOT SURGERY Right    IR GASTROSTOMY TUBE MOD SED  04/08/2018   LEG SURGERY Right         Home Medications    Prior to Admission medications   Medication Sig Start Date End Date Taking? Authorizing Provider  aspirin 81 MG chewable tablet Chew 1 tablet (81 mg total) by mouth daily. Please do not take for five days before your gastrostomy tube  placement procedure Patient taking differently: Chew 81 mg by mouth daily.  03/16/18  Yes Asencion Noble, MD  clopidogrel (PLAVIX) 75 MG tablet Take 1 tablet (75 mg total) by mouth daily. Please do not take for the 5 days before your gastrostomy tube procedure 05/24/18  Yes Revankar, Reita Cliche, MD  fluticasone (FLONASE) 50 MCG/ACT nasal spray Place 1 spray into both nostrils daily.   Yes [provider]  formoterol (PERFOROMIST) 20 MCG/2ML nebulizer solution Take 2 mLs (20 mcg total) by nebulization 2 (two) times daily. 06/11/18  Yes [name], Octavio Graves, DO  Maltodextrin-Xanthan Gum (RESOURCE THICKENUP CLEAR) POWD Take 120 g by mouth as needed. 03/15/18  Yes Asencion Noble, MD  METHADONE HCL PO Take 115 mg by mouth daily.    Yes [provider]  polyethylene glycol (MIRALAX / GLYCOLAX) packet Take 17 g by mouth daily. Patient taking differently: Take 17 g by mouth daily as needed.  03/16/18  Yes Asencion Noble, MD  feeding supplement (OSMOLITE 1 CAL) LIQD Take 474 mLs by mouth 3 (three) times daily with meals. 04/12/18 08/05/19  Rehman, Areeg N, DO  Water For Irrigation, Sterile (FREE WATER) SOLN Place 200 mLs into feeding tube 3 (three) times daily after meals. 04/12/18 10/09/18  [name], DO    Family History Family  History  Problem Relation Age of Onset   COPD Mother    Lung cancer Mother        on HOT    COPD Father    Pneumonia Father    CAD Father        CABG    COPD Sister    Colon cancer Neg Hx     Social History Social History   Tobacco Use   Smoking status: Former Smoker    Packs/day: 1.00    Years: 30.00    Pack years: 30.00    Types: Cigarettes    Start date: 03/11/1988    Quit date: 02/25/2018    Years since quitting: 0.5   Smokeless tobacco: Former Systems developer  Substance Use Topics   Alcohol use: Yes    Comment: 30 years ago    Drug use: No     Allergies   Patient has no known allergies.   Review of Systems Review of  Systems  Constitutional: Positive for appetite change and fatigue. Negative for fever.  HENT: Negative for dental problem.   Respiratory: Positive for cough.   Cardiovascular: Negative for chest pain.  Gastrointestinal: Positive for abdominal pain and nausea. Negative for diarrhea and vomiting.  Genitourinary: Negative for flank pain.  Musculoskeletal: Negative for back pain.  Skin: Negative for rash.  Neurological: Negative for weakness.  Psychiatric/Behavioral: Negative for confusion.     Physical Exam Updated Vital Signs BP (!) 151/68 (BP Location: Right Arm)    Pulse 73    Temp 98.1 F (36.7 C) (Oral)    Resp 18    SpO2 (!) 89%   Physical Exam Vitals signs reviewed.  Constitutional:      Comments: Appears chronically ill  HENT:     Head: Normocephalic.  Cardiovascular:     Rate and Rhythm: Normal rate.  Pulmonary:     Comments: Harsh breath sounds at left base and right upper lung field. Abdominal:     Hernia: No hernia is present.     Comments: PEG tube in left abdomen.  No underlying tenderness.  Skin:    General: Skin is warm.     Capillary Refill: Capillary refill takes less than 2 seconds.  Neurological:     Mental Status: He is oriented to person, place, and time.   No peripheral edema.   ED Treatments / Results  Labs (all labs ordered are listed, but only abnormal results are displayed) Labs Reviewed  COMPREHENSIVE METABOLIC PANEL - Abnormal; Notable for the following components:      Result Value   Sodium 126 (*)    Chloride 75 (*)    CO2 42 (*)    Creatinine, Ser 0.43 (*)    Albumin 3.2 (*)    All other components within normal limits  CBC - Abnormal; Notable for the following components:   WBC 23.8 (*)    RBC 4.13 (*)    Hemoglobin 11.7 (*)    HCT 38.6 (*)    All other components within normal limits  SARS CORONAVIRUS 2 (HOSPITAL ORDER, [facility])  CULTURE, BLOOD (ROUTINE X 2)  CULTURE, BLOOD (ROUTINE X 2)    LIPASE, BLOOD  LACTIC ACID, PLASMA  LACTIC ACID, PLASMA  URINALYSIS, ROUTINE W REFLEX MICROSCOPIC    EKG EKG Interpretation  Date/Time:  Tuesday September 28 2018 18:58:31 EDT Ventricular Rate:  80 PR Interval:  130 QRS Duration: 88 QT Interval:  362 QTC Calculation: 417 R Axis:  91 Text Interpretation:  Normal sinus rhythm Right atrial enlargement Rightward axis Pulmonary disease pattern Abnormal ECG Confirmed by Davonna Belling [phone]) on 09/28/2018 10:15:42 PM   Radiology Ct Abdomen Pelvis W Contrast  Result Date: 09/28/2018 CLINICAL DATA:  Acute abdominal pain with fever. EXAM: CT ABDOMEN AND PELVIS WITH CONTRAST TECHNIQUE: Multidetector CT imaging of the abdomen and pelvis was performed using the standard protocol following bolus administration of intravenous contrast. CONTRAST:  184mL OMNIPAQUE IOHEXOL 300 MG/ML  SOLN COMPARISON:  CT dated June 05, 2018. FINDINGS: Lower chest: There are new airspace opacities throughout the right middle lobe and bilateral lower lobes. There is mucous plugging and bronchial wall thickening. The lungs appear to be at least somewhat hyperexpanded. The heart size is normal. Hepatobiliary: The liver is normal. Normal gallbladder.There is no biliary ductal dilation. Pancreas: Normal contours without ductal dilatation. No peripancreatic fluid collection. Spleen: No splenic laceration or hematoma. Adrenals/Urinary Tract: --Adrenal glands: No adrenal hemorrhage. --Right kidney/ureter: No hydronephrosis or perinephric hematoma. --Left kidney/ureter: No hydronephrosis or perinephric hematoma. --Urinary bladder: Unremarkable. Stomach/Bowel: --Stomach/Duodenum: There is a well-positioned PEG tube. --Small bowel: No dilatation or inflammation. --Colon: There is a moderate amount of stool in the colon. --Appendix: Normal. Vascular/Lymphatic: Atherosclerotic calcification is present within the non-aneurysmal abdominal aorta, without hemodynamically significant stenosis.  --No retroperitoneal lymphadenopathy. --No mesenteric lymphadenopathy. --No pelvic or inguinal lymphadenopathy. Reproductive: Unremarkable Other: No ascites or free air. The abdominal wall is normal. Musculoskeletal. The patient is status post prior posterior fusion of the lumbar spine. There are advanced degenerative changes of the right hip. There is no displaced fracture. No dislocation. IMPRESSION: 1. New airspace opacities in the right middle lobe and bilateral lower lobes concerning for pneumonia or aspiration. 2. Evaluation of the abdomen is limited by lack of intra-abdominal fat. Given this limitation, there is no definite acute intra-abdominal abnormality detected. 3. The gastrostomy tube is well positioned. Electronically Signed   By: Constance Holster M.D.   On: 09/28/2018 21:59   Dg Chest Portable 1 View  Result Date: 09/28/2018 CLINICAL DATA:  Shortness of breath EXAM: PORTABLE CHEST 1 VIEW COMPARISON:  September 25, 2018 FINDINGS: The heart size is stable. The lungs are hyperexpanded. There is no pneumothorax. No large pleural effusion. There are bibasilar airspace opacities. The airspace opacities at the right lung base appear to have progressed since the prior study. The left lung base airspace opacity appears improved. IMPRESSION: 1. Worsening right lower lobe airspace opacity concerning for pneumonia or aspiration. 2. Improving left lower lobe airspace opacity. 3. Hyperexpanded lungs, similar to prior study. Electronically Signed   By: Constance Holster M.D.   On: 09/28/2018 21:29    Procedures Procedures (including critical care time)  Medications Ordered in ED Medications  sodium chloride flush (NS) 0.9 % injection 3 mL (3 mLs Intravenous Not Given 09/28/18 2102)  iohexol (OMNIPAQUE) 300 MG/ML solution 100 mL (100 mLs Intravenous Contrast Given 09/28/18 2136)     Initial Impression / Assessment and Plan / ED Course  I have reviewed the triage vital signs and the nursing  notes.  Pertinent labs & imaging results that were available during my care of the patient were reviewed by me and considered in my medical decision making (see chart for details).        Patient with nausea and feeling worse.  Appears to have recurrent likely aspiration pneumonia.  Mild hypoxia but is on home oxygen.  White count elevated I think this could be is from the steroids he  is been on.  Does not appear septic however I think will benefit for admission to the hospital.  CT scan done due to abdominal symptoms and overall reassuring for abdominal pathology.  Final Clinical Impressions(s) / ED Diagnoses   Final diagnoses:  Aspiration pneumonia, unspecified aspiration pneumonia type, unspecified laterality, unspecified part of lung ([place])  Hyponatremia    ED Discharge Orders    None       Davonna Belling, MD 09/28/18 2244

## 2018-09-28 NOTE — ED Notes (Signed)
Patient transported to CT SCAN . 

## 2018-09-28 NOTE — ED Triage Notes (Signed)
Pt c/o abdominal pain, denies nausea/vomiting/chest pain. Reports that he fell x 3 days ago, concerned his feeding tube may have been damaged, but he has still been able to use it. Hx throat cancer, recent pneumonia diagnosis.
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
## 2018-09-29 ENCOUNTER — Encounter (HOSPITAL_COMMUNITY): Payer: Self-pay | Admitting: Internal Medicine

## 2018-09-29 DIAGNOSIS — Y92009 Unspecified place in unspecified non-institutional (private) residence as the place of occurrence of the external cause: Secondary | ICD-10-CM

## 2018-09-29 DIAGNOSIS — E43 Unspecified severe protein-calorie malnutrition: Secondary | ICD-10-CM

## 2018-09-29 DIAGNOSIS — E871 Hypo-osmolality and hyponatremia: Secondary | ICD-10-CM

## 2018-09-29 DIAGNOSIS — D72825 Bandemia: Secondary | ICD-10-CM

## 2018-09-29 DIAGNOSIS — J690 Pneumonitis due to inhalation of food and vomit: Principal | ICD-10-CM

## 2018-09-29 DIAGNOSIS — R1310 Dysphagia, unspecified: Secondary | ICD-10-CM

## 2018-09-29 DIAGNOSIS — W19XXXA Unspecified fall, initial encounter: Secondary | ICD-10-CM

## 2018-09-29 LAB — URINALYSIS, ROUTINE W REFLEX MICROSCOPIC
Bilirubin Urine: NEGATIVE
Glucose, UA: NEGATIVE mg/dL
Hgb urine dipstick: NEGATIVE
Ketones, ur: 20 mg/dL — AB
Nitrite: NEGATIVE
Protein, ur: 30 mg/dL — AB
Specific Gravity, Urine: 1.015 (ref 1.005–1.030)
pH: 9 — ABNORMAL HIGH (ref 5.0–8.0)

## 2018-09-29 LAB — MAGNESIUM
Magnesium: 1.7 mg/dL (ref 1.7–2.4)
Magnesium: 1.7 mg/dL (ref 1.7–2.4)

## 2018-09-29 LAB — COMPREHENSIVE METABOLIC PANEL
ALT: 14 U/L (ref 0–44)
AST: 15 U/L (ref 15–41)
Albumin: 3 g/dL — ABNORMAL LOW (ref 3.5–5.0)
Alkaline Phosphatase: 49 U/L (ref 38–126)
Anion gap: 9 (ref 5–15)
BUN: 8 mg/dL (ref 6–20)
CO2: 40 mmol/L — ABNORMAL HIGH (ref 22–32)
Calcium: 8.7 mg/dL — ABNORMAL LOW (ref 8.9–10.3)
Chloride: 76 mmol/L — ABNORMAL LOW (ref 98–111)
Creatinine, Ser: 0.41 mg/dL — ABNORMAL LOW (ref 0.61–1.24)
GFR calc Af Amer: >60 mL/min (ref >60)
GFR calc non Af Amer: >60 mL/min (ref >60)
Glucose, Bld: 117 mg/dL — ABNORMAL HIGH (ref 70–99)
Potassium: 4 mmol/L (ref 3.5–5.1)
Sodium: 125 mmol/L — ABNORMAL LOW (ref 135–145)
Total Bilirubin: 0.9 mg/dL (ref 0.3–1.2)
Total Protein: 6.9 g/dL (ref 6.5–8.1)

## 2018-09-29 LAB — CBC
HCT: 38.8 % — ABNORMAL LOW (ref 39.0–52.0)
Hemoglobin: 12.1 g/dL — ABNORMAL LOW (ref 13.0–17.0)
MCH: 28.5 pg (ref 26.0–34.0)
MCHC: 31.2 g/dL (ref 30.0–36.0)
MCV: 91.3 fL (ref 80.0–100.0)
Platelets: 254 10*3/uL (ref 150–400)
RBC: 4.25 MIL/uL (ref 4.22–5.81)
RDW: 13.2 % (ref 11.5–15.5)
WBC: 24.8 10*3/uL — ABNORMAL HIGH (ref 4.0–10.5)
nRBC: 0 % (ref 0.0–0.2)

## 2018-09-29 LAB — LACTIC ACID, PLASMA: Lactic Acid, Venous: 0.7 mmol/L (ref 0.5–1.9)

## 2018-09-29 LAB — OSMOLALITY, URINE: Osmolality, Ur: 601 mOsm/kg (ref 300–900)

## 2018-09-29 LAB — HIV ANTIBODY (ROUTINE TESTING W REFLEX): HIV Screen 4th Generation wRfx: NONREACTIVE

## 2018-09-29 LAB — PREALBUMIN: Prealbumin: 11.6 mg/dL — ABNORMAL LOW (ref 18–38)

## 2018-09-29 LAB — PHOSPHORUS
Phosphorus: 2.3 mg/dL — ABNORMAL LOW (ref 2.5–4.6)
Phosphorus: 2.8 mg/dL (ref 2.5–4.6)

## 2018-09-29 LAB — CREATININE, URINE, RANDOM: Creatinine, Urine: 66.02 mg/dL

## 2018-09-29 LAB — STREP PNEUMONIAE URINARY ANTIGEN: Strep Pneumo Urinary Antigen: NEGATIVE

## 2018-09-29 LAB — SODIUM, URINE, RANDOM: Sodium, Ur: 115 mmol/L

## 2018-09-29 LAB — GLUCOSE, CAPILLARY
Glucose-Capillary: 147 mg/dL — ABNORMAL HIGH (ref 70–99)
Glucose-Capillary: 126 mg/dL — ABNORMAL HIGH (ref 70–99)
Glucose-Capillary: 128 mg/dL — ABNORMAL HIGH (ref 70–99)

## 2018-09-29 LAB — MRSA PCR SCREENING: MRSA by PCR: NEGATIVE

## 2018-09-29 LAB — TSH: TSH: 0.612 u[IU]/mL (ref 0.350–4.500)

## 2018-09-29 MED ORDER — POLYETHYLENE GLYCOL 3350 17 G PO PACK: 17.0000 g | PACK | Freq: Every day | ORAL | Status: DC | PRN

## 2018-09-29 MED ORDER — JEVITY 1.2 CAL PO LIQD
474.0000 mL | Freq: Three times a day (TID) | ORAL | Status: DC
  Filled 2018-09-29 (×2): qty 474

## 2018-09-29 MED ORDER — HYDROCODONE-ACETAMINOPHEN 5-325 MG PO TABS
1.0000 | ORAL_TABLET | ORAL | Status: DC | PRN
  Administered 2018-09-29 – 2018-09-30 (×5): 1 via ORAL
  Administered 2018-10-01 – 2018-10-02 (×4): 2 via ORAL
  Filled 2018-09-29: qty 2
  Filled 2018-09-29 (×2): qty 1
  Filled 2018-09-29 (×2): qty 2
  Filled 2018-09-29 (×3): qty 1
  Filled 2018-09-29: qty 2
  Filled 2018-09-29: qty 1

## 2018-09-29 MED ORDER — METHADONE HCL 10 MG PO TABS
115.0000 mg | ORAL_TABLET | Freq: Every day | ORAL | Status: DC
  Administered 2018-09-29 – 2018-10-02 (×4): 115 mg
  Filled 2018-09-29 (×5): qty 12

## 2018-09-29 MED ORDER — ALBUTEROL SULFATE (2.5 MG/3ML) 0.083% IN NEBU: 2.5000 mg | INHALATION_SOLUTION | RESPIRATORY_TRACT | Status: DC | PRN

## 2018-09-29 MED ORDER — VANCOMYCIN HCL IN DEXTROSE 750-5 MG/150ML-% IV SOLN
750.0000 mg | Freq: Two times a day (BID) | INTRAVENOUS | Status: DC
  Filled 2018-09-29: qty 150

## 2018-09-29 MED ORDER — ONDANSETRON HCL 4 MG/2ML IJ SOLN
4.0000 mg | Freq: Four times a day (QID) | INTRAMUSCULAR | Status: DC | PRN
  Administered 2018-09-29 – 2018-10-01 (×3): 4 mg via INTRAVENOUS
  Filled 2018-09-29 (×3): qty 2

## 2018-09-29 MED ORDER — BUDESONIDE 0.5 MG/2ML IN SUSP
0.5000 mg | Freq: Two times a day (BID) | RESPIRATORY_TRACT | Status: DC
  Administered 2018-09-29 – 2018-10-02 (×6): 0.5 mg via RESPIRATORY_TRACT
  Filled 2018-09-29 (×6): qty 2

## 2018-09-29 MED ORDER — SODIUM CHLORIDE 0.9 % IV SOLN
3.0000 g | Freq: Four times a day (QID) | INTRAVENOUS | Status: DC
  Administered 2018-09-29 – 2018-10-01 (×8): 3 g via INTRAVENOUS
  Filled 2018-09-29: qty 3
  Filled 2018-09-29: qty 8
  Filled 2018-09-29 (×3): qty 3
  Filled 2018-09-29: qty 8
  Filled 2018-09-29 (×4): qty 3

## 2018-09-29 MED ORDER — AZITHROMYCIN 250 MG PO TABS
250.0000 mg | ORAL_TABLET | Freq: Every day | ORAL | Status: DC
  Administered 2018-09-30 – 2018-10-02 (×3): 250 mg via ORAL
  Filled 2018-09-29 (×3): qty 1

## 2018-09-29 MED ORDER — RESOURCE THICKENUP CLEAR PO POWD
ORAL | Status: DC | PRN
  Filled 2018-09-29: qty 125

## 2018-09-29 MED ORDER — ONDANSETRON HCL 4 MG PO TABS
4.0000 mg | ORAL_TABLET | Freq: Four times a day (QID) | ORAL | Status: DC | PRN
  Administered 2018-09-29: 4 mg via ORAL
  Filled 2018-09-29 (×2): qty 1

## 2018-09-29 MED ORDER — ACETAMINOPHEN 325 MG PO TABS: 650.0000 mg | ORAL_TABLET | Freq: Four times a day (QID) | ORAL | Status: DC | PRN

## 2018-09-29 MED ORDER — AZITHROMYCIN 250 MG PO TABS
500.0000 mg | ORAL_TABLET | Freq: Every day | ORAL | Status: AC
  Administered 2018-09-29: 500 mg via ORAL
  Filled 2018-09-29: qty 2

## 2018-09-29 MED ORDER — DOXYCYCLINE HYCLATE 100 MG PO TABS: 100.0000 mg | ORAL_TABLET | Freq: Two times a day (BID) | ORAL | Status: DC

## 2018-09-29 MED ORDER — ARFORMOTEROL TARTRATE 15 MCG/2ML IN NEBU
15.0000 ug | INHALATION_SOLUTION | Freq: Two times a day (BID) | RESPIRATORY_TRACT | Status: DC
  Administered 2018-09-29 – 2018-10-01 (×5): 15 ug via RESPIRATORY_TRACT
  Filled 2018-09-29 (×5): qty 2

## 2018-09-29 MED ORDER — ENSURE ENLIVE PO LIQD
237.0000 mL | Freq: Two times a day (BID) | ORAL | Status: DC
  Administered 2018-10-01 (×2): 237 mL via ORAL

## 2018-09-29 MED ORDER — BISACODYL 10 MG RE SUPP: 10.0000 mg | Freq: Every day | RECTAL | Status: DC | PRN

## 2018-09-29 MED ORDER — IPRATROPIUM-ALBUTEROL 0.5-2.5 (3) MG/3ML IN SOLN: 3.0000 mL | RESPIRATORY_TRACT | Status: DC | PRN

## 2018-09-29 MED ORDER — ACETAMINOPHEN 650 MG RE SUPP: 650.0000 mg | Freq: Four times a day (QID) | RECTAL | Status: DC | PRN

## 2018-09-29 MED ORDER — ASPIRIN 81 MG PO CHEW
81.0000 mg | CHEWABLE_TABLET | Freq: Every day | ORAL | Status: DC
  Administered 2018-09-29 – 2018-10-02 (×4): 81 mg via ORAL
  Filled 2018-09-29 (×5): qty 1

## 2018-09-29 MED ORDER — PIPERACILLIN-TAZOBACTAM 3.375 G IVPB
3.3750 g | Freq: Three times a day (TID) | INTRAVENOUS | Status: DC
  Administered 2018-09-29: 3.375 g via INTRAVENOUS
  Filled 2018-09-29 (×2): qty 50

## 2018-09-29 MED ORDER — GUAIFENESIN-DM 100-10 MG/5ML PO SYRP
5.0000 mL | ORAL_SOLUTION | ORAL | Status: DC | PRN
  Administered 2018-09-29: 5 mL via ORAL
  Filled 2018-09-29 (×2): qty 5

## 2018-09-29 MED ORDER — JEVITY 1.2 CAL PO LIQD
1000.0000 mL | ORAL | Status: DC
  Administered 2018-09-29 – 2018-09-30 (×2): 1000 mL
  Filled 2018-09-29 (×5): qty 1000

## 2018-09-29 MED ORDER — CLOPIDOGREL BISULFATE 75 MG PO TABS
75.0000 mg | ORAL_TABLET | Freq: Every day | ORAL | Status: DC
  Administered 2018-09-29 – 2018-10-02 (×4): 75 mg via ORAL
  Filled 2018-09-29 (×5): qty 1

## 2018-09-29 MED ORDER — SENNA 8.6 MG PO TABS
1.0000 | ORAL_TABLET | Freq: Two times a day (BID) | ORAL | Status: DC
  Administered 2018-09-29 – 2018-10-02 (×6): 8.6 mg via ORAL
  Filled 2018-09-29 (×7): qty 1

## 2018-09-29 MED ORDER — ORAL CARE MOUTH RINSE
15.0000 mL | Freq: Two times a day (BID) | OROMUCOSAL | Status: DC
  Administered 2018-09-30 – 2018-10-02 (×4): 15 mL via OROMUCOSAL

## 2018-09-29 MED ORDER — SODIUM CHLORIDE 0.9 % IV SOLN
INTRAVENOUS | Status: AC
  Administered 2018-09-29: 02:00:00 via INTRAVENOUS

## 2018-09-29 NOTE — ED Notes (Signed)
ED TO INPATIENT HANDOFF REPORT  ED Nurse Name and Phone #:  Herbie Baltimore RN [phone]  S Name/Age/Gender Tony Berry 58 y.o. male Room/Bed: 019C/019C  Code Status   Code Status: Prior  Home/SNF/Other Home {Patient oriented x4 Is this baseline? Yes  Triage Complete: Triage complete  Chief Complaint TROUBLE BREATHING AND CHECK FEEDING TUBE  Triage Note Pt c/o abdominal pain, denies nausea/vomiting/chest pain. Reports that he fell x 3 days ago, concerned his feeding tube may have been damaged, but he has still been able to use it. Hx throat cancer, recent pneumonia diagnosis.   Allergies No Known Allergies  Level of Care/Admitting Diagnosis ED Disposition    ED Disposition Condition Comment   Admit  Hospital Area: [facility] [[id number]]  Level of Care: Med-Surg [16]  Covid Evaluation: Confirmed COVID Negative  Diagnosis: Aspiration pneumonia [facility]) [[id number]]  Admitting Physician: Toy Baker [[id number]]  Attending Physician: Toy Baker [[id number]]  Estimated length of stay: 3 - 4 days  Certification:: I certify this patient will need inpatient services for at least 2 midnights  PT Class (Do Not Modify): Inpatient [101]  PT Acc Code (Do Not Modify): Private [1]       B Medical/Surgery History Past Medical History:  Diagnosis Date  . Esophageal cancer ([place])   . Head and neck cancer ([place])   . Hypertension    Past Surgical History:  Procedure Laterality Date  . BACK SURGERY  2008, 2011  . FOOT SURGERY Right   . IR GASTROSTOMY TUBE MOD SED  04/08/2018  . LEG SURGERY Right      A IV Location/Drains/Wounds Patient Lines/Drains/Airways Status   Active Line/Drains/Airways    Name:   Placement date:   Placement time:   Site:   Days:   Peripheral IV 09/28/18 Left;Upper Antecubital   09/28/18    2103    Antecubital   1   Gastrostomy/Enterostomy Gastrostomy 20 Fr. LUQ   04/08/18    1115    LUQ   174          Intake/Output Last 24 hours No  intake or output data in the 24 hours ending 09/29/18 4496  Labs/Imaging Results for orders placed or performed during the hospital encounter of 09/28/18 (from the past 48 hour(s))  Lipase, blood     Status: None   Collection Time: 09/28/18  6:53 PM  Result Value Ref Range   Lipase 19 11 - 51 U/L    Comment: Performed at [facility], [place] [address], [place], [place] [address]  Comprehensive metabolic panel     Status: Abnormal   Collection Time: 09/28/18  6:53 PM  Result Value Ref Range   Sodium 126 (L) 135 - 145 mmol/L   Potassium 4.0 3.5 - 5.1 mmol/L   Chloride 75 (L) 98 - 111 mmol/L   CO2 42 (H) 22 - 32 mmol/L   Glucose, Bld 82 70 - 99 mg/dL   BUN 9 6 - 20 mg/dL   Creatinine, Ser 0.43 (L) 0.61 - 1.24 mg/dL   Calcium 9.0 8.9 - 10.3 mg/dL   Total Protein 7.0 6.5 - 8.1 g/dL   Albumin 3.2 (L) 3.5 - 5.0 g/dL   AST 17 15 - 41 U/L   ALT 14 0 - 44 U/L   Alkaline Phosphatase 54 38 - 126 U/L   Total Bilirubin 1.1 0.3 - 1.2 mg/dL   GFR calc non Af Amer >60 >60 mL/min   GFR calc Af Amer >  60 >60 mL/min   Anion gap 9 5 - 15    Comment: Performed at [facility] [address] [place], [place] [address]  CBC     Status: Abnormal   Collection Time: 09/28/18  6:53 PM  Result Value Ref Range   WBC 23.8 (H) 4.0 - 10.5 K/uL   RBC 4.13 (L) 4.22 - 5.81 MIL/uL   Hemoglobin 11.7 (L) 13.0 - 17.0 g/dL   HCT 38.6 (L) 39.0 - 52.0 %   MCV 93.5 80.0 - 100.0 fL   MCH 28.3 26.0 - 34.0 pg   MCHC 30.3 30.0 - 36.0 g/dL   RDW 13.2 11.5 - 15.5 %   Platelets 293 150 - 400 K/uL   nRBC 0.0 0.0 - 0.2 %    Comment: Performed at [facility], [place] [address] [place], [place] [address]  SARS Coronavirus 2 (CEPHEID - Performed in [place] hospital lab), Hosp Order     Status: None   Collection Time: 09/28/18  9:00 PM   Specimen: Nasopharyngeal Swab  Result Value Ref Range   SARS Coronavirus 2 NEGATIVE NEGATIVE    Comment: (NOTE) If result is NEGATIVE SARS-CoV-2 target nucleic acids  are NOT DETECTED. The SARS-CoV-2 RNA is generally detectable in upper and lower  respiratory specimens during the acute phase of infection. The lowest  concentration of SARS-CoV-2 viral copies this assay can detect is 250  copies / mL. A negative result does not preclude SARS-CoV-2 infection  and should not be used as the sole basis for treatment or other  patient management decisions.  A negative result may occur with  improper specimen collection / handling, submission of specimen other  than nasopharyngeal swab, presence of viral mutation(s) within the  areas targeted by this assay, and inadequate number of viral copies  (<250 copies / mL). A negative result must be combined with clinical  observations, patient history, and epidemiological information. If result is POSITIVE SARS-CoV-2 target nucleic acids are DETECTED. The SARS-CoV-2 RNA is generally detectable in upper and lower  respiratory specimens dur ing the acute phase of infection.  Positive  results are indicative of active infection with SARS-CoV-2.  Clinical  correlation with patient history and other diagnostic information is  necessary to determine patient infection status.  Positive results do  not rule out bacterial infection or co-infection with other viruses. If result is PRESUMPTIVE POSTIVE SARS-CoV-2 nucleic acids MAY BE PRESENT.   A presumptive positive result was obtained on the submitted specimen  and confirmed on repeat testing.  While 2019 novel coronavirus  (SARS-CoV-2) nucleic acids may be present in the submitted sample  additional confirmatory testing may be necessary for epidemiological  and / or clinical management purposes  to differentiate between  SARS-CoV-2 and other Sarbecovirus currently known to infect humans.  If clinically indicated additional testing with an alternate test  methodology [phone]) is advised. The SARS-CoV-2 RNA is generally  detectable in upper and lower respiratory sp ecimens  during the acute  phase of infection. The expected result is Negative. Fact Sheet for Patients:  StrictlyIdeas.no Fact Sheet for Healthcare Providers: BankingDealers.co.za This test is not yet approved or cleared by the [place] FDA and has been authorized for detection and/or diagnosis of SARS-CoV-2 by FDA under an Emergency Use Authorization (EUA).  This EUA will remain in effect (meaning this test can be used) for the duration of the COVID-19 declaration under Section 564(b)(1) of the Act, 21 U.S.C. section 360bbb-3(b)(1), unless  the authorization is terminated or revoked sooner. Performed at [facility], [place] [address], [place], [place] [address]   Lactic acid, plasma     Status: None   Collection Time: 09/28/18  9:21 PM  Result Value Ref Range   Lactic Acid, Venous 0.7 0.5 - 1.9 mmol/L    Comment: Performed at [facility] [address], [place], [place] [address]   Ct Abdomen Pelvis W Contrast  Result Date: 09/28/2018 CLINICAL DATA:  Acute abdominal pain with fever. EXAM: CT ABDOMEN AND PELVIS WITH CONTRAST TECHNIQUE: Multidetector CT imaging of the abdomen and pelvis was performed using the standard protocol following bolus administration of intravenous contrast. CONTRAST:  153mL OMNIPAQUE IOHEXOL 300 MG/ML  SOLN COMPARISON:  CT dated June 05, 2018. FINDINGS: Lower chest: There are new airspace opacities throughout the right middle lobe and bilateral lower lobes. There is mucous plugging and bronchial wall thickening. The lungs appear to be at least somewhat hyperexpanded. The heart size is normal. Hepatobiliary: The liver is normal. Normal gallbladder.There is no biliary ductal dilation. Pancreas: Normal contours without ductal dilatation. No peripancreatic fluid collection. Spleen: No splenic laceration or hematoma. Adrenals/Urinary Tract: --Adrenal glands: No adrenal hemorrhage. --Right kidney/ureter: No  hydronephrosis or perinephric hematoma. --Left kidney/ureter: No hydronephrosis or perinephric hematoma. --Urinary bladder: Unremarkable. Stomach/Bowel: --Stomach/Duodenum: There is a well-positioned PEG tube. --Small bowel: No dilatation or inflammation. --Colon: There is a moderate amount of stool in the colon. --Appendix: Normal. Vascular/Lymphatic: Atherosclerotic calcification is present within the non-aneurysmal abdominal aorta, without hemodynamically significant stenosis. --No retroperitoneal lymphadenopathy. --No mesenteric lymphadenopathy. --No pelvic or inguinal lymphadenopathy. Reproductive: Unremarkable Other: No ascites or free air. The abdominal wall is normal. Musculoskeletal. The patient is status post prior posterior fusion of the lumbar spine. There are advanced degenerative changes of the right hip. There is no displaced fracture. No dislocation. IMPRESSION: 1. New airspace opacities in the right middle lobe and bilateral lower lobes concerning for pneumonia or aspiration. 2. Evaluation of the abdomen is limited by lack of intra-abdominal fat. Given this limitation, there is no definite acute intra-abdominal abnormality detected. 3. The gastrostomy tube is well positioned. Electronically Signed   By: Constance Holster M.D.   On: 09/28/2018 21:59   Dg Chest Portable 1 View  Result Date: 09/28/2018 CLINICAL DATA:  Shortness of breath EXAM: PORTABLE CHEST 1 VIEW COMPARISON:  September 25, 2018 FINDINGS: The heart size is stable. The lungs are hyperexpanded. There is no pneumothorax. No large pleural effusion. There are bibasilar airspace opacities. The airspace opacities at the right lung base appear to have progressed since the prior study. The left lung base airspace opacity appears improved. IMPRESSION: 1. Worsening right lower lobe airspace opacity concerning for pneumonia or aspiration. 2. Improving left lower lobe airspace opacity. 3. Hyperexpanded lungs, similar to prior study. Electronically  Signed   By: Constance Holster M.D.   On: 09/28/2018 21:29    Pending Labs Unresulted Labs (From admission, onward)    Start     Ordered   09/28/18 2348  Magnesium  Add-on,   AD     09/28/18 2347   09/28/18 2348  Phosphorus  Add-on,   AD     09/28/18 2347   09/28/18 2342  Sodium, urine, random  Once,   STAT     09/28/18 2341   09/28/18 2342  Creatinine, urine, random  Once,   STAT     09/28/18 2341   09/28/18 2342  Osmolality, urine  Once,  STAT     09/28/18 2341   09/28/18 2230  Urinalysis, Routine w reflex microscopic  Once,   R     09/28/18 2230   09/28/18 2112  Lactic acid, plasma  Now then every 2 hours,   STAT     09/28/18 2111   09/28/18 2112  Culture, blood (routine x 2)  BLOOD CULTURE X 2,   STAT     09/28/18 2111   Signed and Held  Prealbumin  Tomorrow morning,   R     Signed and Held   Signed and Held  HIV antibody (Routine Screening)  Tomorrow morning,   R     Signed and Held   Signed and Held  Culture, sputum-assessment  Once,   R    Question:  Patient immune status  Answer:  Immunocompromised   Signed and Held   Signed and Held  Legionella Pneumophila Serogp 1 Ur Ag  Once,   R     Signed and Held   Visual merchandiser and Held  Strep pneumoniae urinary antigen  Once,   R     Signed and Held   Visual merchandiser and Held  MRSA PCR Screening  Once,   R    Question:  Patient immune status  Answer:  Immunocompromised   Signed and Held   Visual merchandiser and Held  Magnesium  Tomorrow morning,   R    Comments: Call MD if <1.5    Signed and Held   Signed and Held  Phosphorus  Tomorrow morning,   R     Signed and Held   Signed and Held  TSH  Once,   R    Comments: Cancel if already done within 1 month and notify MD    Signed and Held   Signed and Held  Comprehensive metabolic panel  Once,   R    Comments: Cal MD for K<3.5 or >5.0    Signed and Held   Signed and Held  CBC  Once,   R    Comments: Call for hg <8.0    Signed and Held          Vitals/Pain Today's Vitals   09/28/18 2345  09/29/18 0000 09/29/18 0015 09/29/18 0026  BP: (!) 162/89 (!) 138/94 (!) 161/72   Pulse: 69  72   Resp: 14 15 13    Temp:      TempSrc:      SpO2: 94%  92%   PainSc:    0-No pain    Isolation Precautions No active isolations  Medications Medications  sodium chloride flush (NS) 0.9 % injection 3 mL (3 mLs Intravenous Not Given 09/28/18 2102)  iohexol (OMNIPAQUE) 300 MG/ML solution 100 mL (100 mLs Intravenous Contrast Given 09/28/18 2136)  piperacillin-tazobactam (ZOSYN) IVPB 3.375 g (0 g Intravenous Stopped 09/29/18 0004)  vancomycin (VANCOCIN) IVPB 1000 mg/200 mL premix (0 mg Intravenous Stopped 09/29/18 0004)    Mobility walks Low fall risk   Focused Assessments Patient has a G-tube.   R Recommendations: See Admitting Provider Note  Report given to:   Additional Notes:

## 2018-09-29 NOTE — Evaluation (Addendum)
Occupational Therapy Evaluation Patient Details Name: Tony Berry MRN: [id number] DOB: 1960-05-26 Today's Date: 09/29/2018    History of Present Illness 58 year old male with history of head and neck cancer status post radiation, dysphagia, severe protein calorie malnutrition, chronic respiratory failure on 3 L at baseline, HTN, hyponatremia and PVD presenting with abdominal pain and concern about feeding tube dislodging after mechanical fall at home 3 days prior. Pt found to have aspiration pneumonia.    Clinical Impression   This 58 y/o male presents with the above. PTA pt reports he is independent with ADL and functional mobility. Pt presenting with deficits including generalized weakness, decreased activity tolerance. Pt currently requiring minguard assist for room level mobility without AD; overall at minguard assist level for ADL completion. Pt requiring 5L O2 to maintain O2 sats at 88% and greater after brief standing activity; requires cues for pursed lip breathing. Pt reports he lives with spouse who is able to assist PRN after discharge. He will benefit from continued OT services to maximize his safety and independence with ADL and mobility prior to return home. Will follow.     Follow Up Recommendations  Supervision/Assistance - 24 hour;Home health OT(HH vs none)    Equipment Recommendations  None recommended by OT           Precautions / Restrictions Precautions Precautions: Fall Precaution Comments: watch O2 sats Restrictions Weight Bearing Restrictions: No      Mobility Bed Mobility               General bed mobility comments: seated EOB upon arrival  Transfers Overall transfer level: Needs assistance Equipment used: None Transfers: Sit to/from Stand Sit to Stand: Supervision         General transfer comment: for lines/safety    Balance Overall balance assessment: Mild deficits observed, not formally tested                                          ADL either performed or assessed with clinical judgement   ADL Overall ADL's : Needs assistance/impaired   Eating/Feeding Details (indicate cue type and reason): pt with PEG; NPO except nectar thick liquids Grooming: Wash/dry hands;Min guard;Standing Grooming Details (indicate cue type and reason): assist for reaching some items; minguard standing balance Upper Body Bathing: Set up;Sitting   Lower Body Bathing: Min guard;Sit to/from stand   Upper Body Dressing : Set up;Sitting   Lower Body Dressing: Min guard;Sit to/from stand   Toilet Transfer: Min guard;Ambulation Toilet Transfer Details (indicate cue type and reason): simulated via transfer to/from recliner Toileting- Water quality scientist and Hygiene: Min guard;Sit to/from stand       Functional mobility during ADLs: Min guard General ADL Comments: pt desats quickly, decreased activity tolerance                         Pertinent Vitals/Pain Pain Assessment: Faces Faces Pain Scale: Hurts little more Pain Location: abdominal pain Pain Descriptors / Indicators: Discomfort Pain Intervention(s): Monitored during session;Limited activity within patient's tolerance     Hand Dominance Right   Extremity/Trunk Assessment Upper Extremity Assessment Upper Extremity Assessment: Generalized weakness   Lower Extremity Assessment Lower Extremity Assessment: Defer to PT evaluation   Cervical / Trunk Assessment Cervical / Trunk Assessment: Kyphotic   Communication Communication Communication: Expressive difficulties(soft spoken)   Cognition Arousal/Alertness: Awake/alert Behavior During Therapy:  WFL for tasks assessed/performed Overall Cognitive Status: Within Functional Limits for tasks assessed                                 General Comments: overall WFL, however RN reports upon entering room pt not wearing his O2 and unaware    General Comments  pt requiring 5L O2 to maintain sats  at 88% and above. requires cues for deep breathing techniques.     Exercises     Shoulder Instructions      Home Living Family/patient expects to be discharged to:: Private residence Living Arrangements: Spouse/significant other Available Help at Discharge: Family;Available 24 hours/day Type of Home: Mobile home Home Access: Stairs to enter Entrance Stairs-Number of Steps: 4 Entrance Stairs-Rails: Can reach both;Right;Left Home Layout: One level     Bathroom Shower/Tub: Teacher, early years/pre: Standard     Home Equipment: Environmental consultant - 2 wheels;Cane - single point;Walker - 4 wheels;Shower seat;Bedside commode          Prior Functioning/Environment Level of Independence: Independent                 OT Problem List: Decreased strength;Decreased range of motion;Decreased activity tolerance;Cardiopulmonary status limiting activity;Impaired balance (sitting and/or standing)      OT Treatment/Interventions: Self-care/ADL training;Therapeutic exercise;Neuromuscular education;DME and/or AE instruction;Therapeutic activities;Balance training;Patient/family education;Energy conservation    OT Goals(Current goals can be found in the care plan section) Acute Rehab OT Goals Patient Stated Goal: none stated, agreeable to working with therapies OT Goal Formulation: With patient Time For Goal Achievement: 10/13/18 Potential to Achieve Goals: Good  OT Frequency: Min 2X/week   Barriers to D/C:            Co-evaluation              AM-PAC OT "6 Clicks" Daily Activity     Outcome Measure Help from another person eating meals?: A Lot Help from another person taking care of personal grooming?: None Help from another person toileting, which includes using toliet, bedpan, or urinal?: A Little Help from another person bathing (including washing, rinsing, drying)?: A Little Help from another person to put on and taking off regular upper body clothing?: None Help from  another person to put on and taking off regular lower body clothing?: A Little 6 Click Score: 19   End of Session Equipment Utilized During Treatment: Oxygen Nurse Communication: Mobility status  Activity Tolerance: Patient tolerated treatment well Patient left: in chair;with call bell/phone within reach;with chair alarm set;with nursing/sitter in room  OT Visit Diagnosis: Muscle weakness (generalized) (M62.81)                Time: 3662-9476 OT Time Calculation (min): 21 min Charges:  OT General Charges $OT Visit: 1 Visit OT Evaluation $OT Eval Moderate Complexity: 1 Mod  Lou Cal, OT [employer] Pager [phone] Office [phone]  Raymondo Band 09/29/2018, 2:20 PM

## 2018-09-29 NOTE — Progress Notes (Signed)
Initial Nutrition Assessment  DOCUMENTATION CODES:   Underweight  INTERVENTION:   Resume Jevity 1.2 @ 60 ml/hr via PEG. This provides 1728 kcal, 79g protein and 1162 ml H2O.  Provide Magic cup BID with meals, each supplement provides 290 kcal and 9 grams of protein  NUTRITION DIAGNOSIS:   Increased nutrient needs related to cancer and cancer related treatments as evidenced by estimated needs.  GOAL:   Patient will meet greater than or equal to 90% of their needs  MONITOR:   PO intake, Supplement acceptance, Labs, Weight trends, I & O's  REASON FOR ASSESSMENT:   Consult Assessment of nutrition requirement/status, Enteral/tube feeding initiation and management(verbal)  ASSESSMENT:   58 y.o. male with medical history significant of head and neck Ca sp radiation on O2 at 3l at baseline, HTn, hyponatremia, PVD. Admitted for abdominal pain following a fall 3 days PTA. Pt has also been having Mild associated cough, and shortness of breath  **RD working remotely**  Per secure message, MD authorized RD to resume patient's home tube feeding regimen.   RD has been unable to reach patient by phone. Per chart review, pt was admitted in January 2020 and was discharged home on bolus feeds of Osmolite 1.2. However, most recent home medications state that pt receives continuous feeds of Jevity 1.2 (receives 6 cartons/day). Will initiate continuous feeds and can transition to bolus feeds if that is deemed appropriate.   SLP evaluated patient today and placed pt on a nectar thick liquid diet only. Pt was consuming ice cream PO PTA. Would likely be able to tolerate Magic Cups as they are thicker in consistency, will order.   Per weight records, pt has lost 4 lbs since February 2020. Weight has remained pretty stable.  Medications: Zofran PRN Labs reviewed: Low Na Mg/Phos WNL  NUTRITION - FOCUSED PHYSICAL EXAM:  Unable to perform -working remotely.  Diet Order:   Diet Order             Diet NPO time specified Except for: Other (See Comments)  Diet effective now              EDUCATION NEEDS:   No education needs have been identified at this time  Skin:  Skin Assessment: Reviewed RN Assessment  Last BM:  7/8  Height:   Ht Readings from Last 1 Encounters:  09/29/18 5\' 7"  (1.702 m)    Weight:   Wt Readings from Last 1 Encounters:  09/29/18 49.9 kg    Ideal Body Weight:  67.2 kg  BMI:  Body mass index is 17.23 kg/m.  Estimated Nutritional Needs:   Kcal:  1600-1800  Protein:  75-90g  Fluid:  1.8L/day  Clayton Bibles, MS, RD, LDN [place] Dietitian Pager: [phone] After Hours Pager: [phone]

## 2018-09-29 NOTE — Evaluation (Signed)
Clinical/Bedside Swallow Evaluation Patient Details  Name: Tony Berry MRN: [id number] Date of Birth: 11-Mar-1961  Today's Date: 09/29/2018 Time: SLP Start Time (ACUTE ONLY): 87 SLP Stop Time (ACUTE ONLY): 1148 SLP Time Calculation (min) (ACUTE ONLY): 18 min  Past Medical History:  Past Medical History:  Diagnosis Date  . Esophageal cancer ([place])   . Head and neck cancer ([place])   . Hypertension    Past Surgical History:  Past Surgical History:  Procedure Laterality Date  . BACK SURGERY  2008, 2011  . FOOT SURGERY Right   . IR GASTROSTOMY TUBE MOD SED  04/08/2018  . LEG SURGERY Right    HPI:  Tony Berry is a 58 y.o. male with medical history significant of head and neck Ca sp radiation on O2 at 3l at baseline, HTn, hyponatremia, PVD. history of recurrent aspriation pna. Has been seen by [facility] SLP in 12/19 MBS showed aspiration of all textures but "Pt wants to continue eating; he verbalizes understanding that a percentage of all that he eats/drinks is aspirated" However, his wife reports that, though he gets most of his nutrition from a PEG tube, their understanding is that nectar thick liquids are "OK" and pt has also been eating over a pint of ice cream a day, bacause he could get that down ok until he got sick. They have several plans to f/u with ENT, GI and SLP, but none have been realized due to Covid 19. Wife reprots that he was seen by SLP recently at [facility] who confirmed that nectar thick liquids were ok to drink, but no documentation is avaiable and she is unsure when kind of assessment they did. She would like to know definitively what he is "safe" to eat and drink because this would change their plan of care.    Assessment / Plan / Recommendation Clinical Impression  Pt has a history of chronic dyphagia with high risk of aspiration with all textures. He has accepted risk of aspiration in the past and has continued to drink nectar thick liquids and consume other POs when able,  though recently he has only been able to swallow ice cream. He may have had an MBS recently outside of our system that per his report showed tolerance of nectar thick liquids even though the last MBS at [facility] showed high risk of aspriation with all liquids, though nectar was a little bit better than other textures. Pt struggles with hunger and desire to eat and drink. He both says, I dont want to get aspiration pna, but I dont want to stop drinking. At this time he does not want any further swallow testing during this admission. He swallows nectar thick liquids without immediate cough response, though silent aspiration is still strongly suspected. For now pt agrees to drinking only nectar thick liquids and avoiding ice cream. He wants his Jevity hooked back up because he is hungry. Discussed findings with MD. Will f/u tomorrow to check in. Pt should continue efforts to f/u with ENT, GI and SLP OP.  SLP Visit Diagnosis: Dysphagia, oropharyngeal phase (R13.12)    Aspiration Risk  Severe aspiration risk    Diet Recommendation Nectar-thick liquid   Liquid Administration via: Cup Medication Administration: Via alternative means Postural Changes: Seated upright at 90 degrees    Other  Recommendations     Follow up Recommendations Outpatient SLP      Frequency and Duration min 2x/week  1 week       Prognosis  Swallow Study   General HPI: Tony Berry is a 58 y.o. male with medical history significant of head and neck Ca sp radiation on O2 at 3l at baseline, HTn, hyponatremia, PVD. history of recurrent aspriation pna. Has been seen by [facility] SLP in 12/19 MBS showed aspiration of all textures but "Pt wants to continue eating; he verbalizes understanding that a percentage of all that he eats/drinks is aspirated" However, his wife reports that, though he gets most of his nutrition from a PEG tube, their understanding is that nectar thick liquids are "OK" and pt has also been eating over a pint  of ice cream a day, bacause he could get that down ok until he got sick. They have several plans to f/u with ENT, GI and SLP, but none have been realized due to Covid 19. Wife reprots that he was seen by SLP recently at [facility] who confirmed that nectar thick liquids were ok to drink, but no documentation is avaiable and she is unsure when kind of assessment they did. She would like to know definitively what he is "safe" to eat and drink because this would change their plan of care.  Type of Study: Bedside Swallow Evaluation Diet Prior to this Study: Dysphagia 3 (soft);Nectar-thick liquids Temperature Spikes Noted: No Respiratory Status: Room air History of Recent Intubation: No Behavior/Cognition: Alert;Cooperative Oral Care Completed by SLP: No Oral Cavity - Dentition: Poor condition Vision: Functional for self-feeding Self-Feeding Abilities: Able to feed self Patient Positioning: Upright in bed Baseline Vocal Quality: Aphonic Volitional Cough: Weak Volitional Swallow: Able to elicit    Oral/Motor/Sensory Function Overall Oral Motor/Sensory Function: Within functional limits   Ice Chips     Thin Liquid Thin Liquid: Not tested    Nectar Thick Nectar Thick Liquid: Impaired Presentation: Cup;Self Fed Pharyngeal Phase Impairments: Decreased hyoid-laryngeal movement   Honey Thick Honey Thick Liquid: Not tested   Puree Puree: Not tested   Solid     Solid: Not tested     Tony Baltimore, MA CCC-SLP  Acute Rehabilitation Services Pager [phone] Office [phone]  Tony Berry 09/29/2018,11:51 AM

## 2018-09-29 NOTE — Progress Notes (Signed)
Pharmacy Antibiotic Note  Tony Berry is a 58 y.o. male admitted on 09/28/2018 with PNA.  Pharmacy wasconsulted for Vancomycin and Zosyn  Dosing.  Vancomycin 1 g IV given in ED at  2320  MRSA PCR negative, discussed with MD to de-escalate to Unasyn.  Plan: D/c'd Vancomycin and Zosyn Continue Unasyn 3 g IV Q6H  Height: 5\' 7"  (170.2 cm) Weight: 110 lb 0.2 oz (49.9 kg) IBW/kg (Calculated) : 66.1  Temp (24hrs), Avg:98.1 F (36.7 C), Min:98 F (36.7 C), Max:98.1 F (36.7 C)  Recent Labs  Lab 09/28/18 1853 09/28/18 2121 09/29/18 0210  WBC 23.8*  --  24.8*  CREATININE 0.43*  --  0.41*  LATICACIDVEN  --  0.7 0.7    Estimated Creatinine Clearance: 71.9 mL/min (A) (by C-G formula based on SCr of 0.41 mg/dL (L)).    No Known Allergies  Mayra Brahm A. Levada Dy, PharmD, [place] Pager: [phone] Please utilize Amion for appropriate phone number to reach the unit pharmacist ([place])

## 2018-09-29 NOTE — Progress Notes (Signed)
PROGRESS NOTE  Tony Berry EXB:[id number] DOB: 04-30-1960   PCP: Cher Nakai, MD  Patient is from: Home.  Lives with family, wife.  DOA: 09/28/2018 LOS: 1  Brief Narrative / Interim history: 58 year old male with history of head and neck cancer status post radiation, dysphagia, severe protein calorie malnutrition, chronic respiratory failure on 3 L at baseline, HTN, hyponatremia and PVD presenting with abdominal pain and concern about feeding tube dislodging after mechanical fall at home 3 days ago.  Imaging in ED concerning for bilateral aspiration pneumonia.  COVID-19 negative.  Admitted on vancomycin and Zosyn.  Subjective: No major events overnight of this morning.  Reports shortness of breath, productive cough, sharp chest pain and tightness in his stomach.  Denies GU symptoms.  Denies hemoptysis.  Objective: Vitals:   09/29/18 0045 09/29/18 0117 09/29/18 0725 09/29/18 0820  BP: 135/79 (!) 157/72 (!) 168/90   Pulse:  72 64   Resp: 12     Temp:  98 F (36.7 C) 98.1 F (36.7 C)   TempSrc:  Oral Oral   SpO2:  93% 96% 90%  Weight:  49.9 kg    Height:  5\' 7"  (1.702 m)      Intake/Output Summary (Last 24 hours) at 09/29/2018 1341 Last data filed at 09/29/2018 0521 Gross per 24 hour  Intake 521.47 ml  Output --  Net 521.47 ml   Filed Weights   09/29/18 0117  Weight: 49.9 kg    Examination:  GENERAL: Sitting on the edge of bed.  Appears frail and chronically ill. HEENT: MMM.  Vision and hearing grossly intact.  Temporal muscle mass wasting. NECK: Supple.  No apparent JVD but patient sitting upright..  LUNGS:  No IWOB.  Diminished aeration bilaterally. HEART:  RRR. Heart sounds normal.  ABD: Bowel sounds present. Soft. Non tender.  MSK/EXT:  Moves extremities.  Significant muscle mass wasting.  +1 edema bilaterally. SKIN: no apparent skin lesion or wound NEURO: Awake, alert and oriented appropriately.  No gross deficit.  PSYCH: Calm. Normal affect.   I have  personally reviewed the following labs and images:  Radiology Studies: Ct Abdomen Pelvis W Contrast  Result Date: 09/28/2018 CLINICAL DATA:  Acute abdominal pain with fever. EXAM: CT ABDOMEN AND PELVIS WITH CONTRAST TECHNIQUE: Multidetector CT imaging of the abdomen and pelvis was performed using the standard protocol following bolus administration of intravenous contrast. CONTRAST:  12mL OMNIPAQUE IOHEXOL 300 MG/ML  SOLN COMPARISON:  CT dated June 05, 2018. FINDINGS: Lower chest: There are new airspace opacities throughout the right middle lobe and bilateral lower lobes. There is mucous plugging and bronchial wall thickening. The lungs appear to be at least somewhat hyperexpanded. The heart size is normal. Hepatobiliary: The liver is normal. Normal gallbladder.There is no biliary ductal dilation. Pancreas: Normal contours without ductal dilatation. No peripancreatic fluid collection. Spleen: No splenic laceration or hematoma. Adrenals/Urinary Tract: --Adrenal glands: No adrenal hemorrhage. --Right kidney/ureter: No hydronephrosis or perinephric hematoma. --Left kidney/ureter: No hydronephrosis or perinephric hematoma. --Urinary bladder: Unremarkable. Stomach/Bowel: --Stomach/Duodenum: There is a well-positioned PEG tube. --Small bowel: No dilatation or inflammation. --Colon: There is a moderate amount of stool in the colon. --Appendix: Normal. Vascular/Lymphatic: Atherosclerotic calcification is present within the non-aneurysmal abdominal aorta, without hemodynamically significant stenosis. --No retroperitoneal lymphadenopathy. --No mesenteric lymphadenopathy. --No pelvic or inguinal lymphadenopathy. Reproductive: Unremarkable Other: No ascites or free air. The abdominal wall is normal. Musculoskeletal. The patient is status post prior posterior fusion of the lumbar spine. There are advanced degenerative changes of  the right hip. There is no displaced fracture. No dislocation. IMPRESSION: 1. New airspace  opacities in the right middle lobe and bilateral lower lobes concerning for pneumonia or aspiration. 2. Evaluation of the abdomen is limited by lack of intra-abdominal fat. Given this limitation, there is no definite acute intra-abdominal abnormality detected. 3. The gastrostomy tube is well positioned. Electronically Signed   By: Constance Holster M.D.   On: 09/28/2018 21:59   Dg Chest Portable 1 View  Result Date: 09/28/2018 CLINICAL DATA:  Shortness of breath EXAM: PORTABLE CHEST 1 VIEW COMPARISON:  September 25, 2018 FINDINGS: The heart size is stable. The lungs are hyperexpanded. There is no pneumothorax. No large pleural effusion. There are bibasilar airspace opacities. The airspace opacities at the right lung base appear to have progressed since the prior study. The left lung base airspace opacity appears improved. IMPRESSION: 1. Worsening right lower lobe airspace opacity concerning for pneumonia or aspiration. 2. Improving left lower lobe airspace opacity. 3. Hyperexpanded lungs, similar to prior study. Electronically Signed   By: Constance Holster M.D.   On: 09/28/2018 21:29    Microbiology: Recent Results (from the past 240 hour(s))  SARS Coronavirus 2 (CEPHEID - Performed in [place] hospital lab), Hosp Order     Status: None   Collection Time: 09/28/18  9:00 PM   Specimen: Nasopharyngeal Swab  Result Value Ref Range Status   SARS Coronavirus 2 NEGATIVE NEGATIVE Final    Comment: (NOTE) If result is NEGATIVE SARS-CoV-2 target nucleic acids are NOT DETECTED. The SARS-CoV-2 RNA is generally detectable in upper and lower  respiratory specimens during the acute phase of infection. The lowest  concentration of SARS-CoV-2 viral copies this assay can detect is 250  copies / mL. A negative result does not preclude SARS-CoV-2 infection  and should not be used as the sole basis for treatment or other  patient management decisions.  A negative result may occur with  improper specimen collection  / handling, submission of specimen other  than nasopharyngeal swab, presence of viral mutation(s) within the  areas targeted by this assay, and inadequate number of viral copies  (<250 copies / mL). A negative result must be combined with clinical  observations, patient history, and epidemiological information. If result is POSITIVE SARS-CoV-2 target nucleic acids are DETECTED. The SARS-CoV-2 RNA is generally detectable in upper and lower  respiratory specimens dur ing the acute phase of infection.  Positive  results are indicative of active infection with SARS-CoV-2.  Clinical  correlation with patient history and other diagnostic information is  necessary to determine patient infection status.  Positive results do  not rule out bacterial infection or co-infection with other viruses. If result is PRESUMPTIVE POSTIVE SARS-CoV-2 nucleic acids MAY BE PRESENT.   A presumptive positive result was obtained on the submitted specimen  and confirmed on repeat testing.  While 2019 novel coronavirus  (SARS-CoV-2) nucleic acids may be present in the submitted sample  additional confirmatory testing may be necessary for epidemiological  and / or clinical management purposes  to differentiate between  SARS-CoV-2 and other Sarbecovirus currently known to infect humans.  If clinically indicated additional testing with an alternate test  methodology [phone]) is advised. The SARS-CoV-2 RNA is generally  detectable in upper and lower respiratory sp ecimens during the acute  phase of infection. The expected result is Negative. Fact Sheet for Patients:  StrictlyIdeas.no Fact Sheet for Healthcare Providers: BankingDealers.co.za This test is not yet approved or cleared by the [place]  States FDA and has been authorized for detection and/or diagnosis of SARS-CoV-2 by FDA under an Emergency Use Authorization (EUA).  This EUA will remain in effect (meaning this  test can be used) for the duration of the COVID-19 declaration under Section 564(b)(1) of the Act, 21 U.S.C. section 360bbb-3(b)(1), unless the authorization is terminated or revoked sooner. Performed at [facility], [place] [address] [place], [place] [address]   Culture, blood (routine x 2)     Status: None (Preliminary result)   Collection Time: 09/28/18  9:15 PM   Specimen: BLOOD  Result Value Ref Range Status   Specimen Description BLOOD RIGHT ARM  Final   Special Requests   Final    BOTTLES DRAWN AEROBIC AND ANAEROBIC Blood Culture results may not be optimal due to an inadequate volume of blood received in culture bottles   Culture   Final    NO GROWTH < 12 HOURS Performed at [facility], [place] [address], [place], [place] [address]    Report Status PENDING  Incomplete  Culture, blood (routine x 2)     Status: None (Preliminary result)   Collection Time: 09/28/18  9:21 PM   Specimen: BLOOD  Result Value Ref Range Status   Specimen Description BLOOD RIGHT FOREARM  Final   Special Requests   Final    BOTTLES DRAWN AEROBIC AND ANAEROBIC Blood Culture results may not be optimal due to an inadequate volume of blood received in culture bottles   Culture   Final    NO GROWTH < 12 HOURS Performed at [facility], [place] [address] [place], [place] [address]    Report Status PENDING  Incomplete  MRSA PCR Screening     Status: None   Collection Time: 09/29/18  1:20 AM   Specimen: Nasal Mucosa; Nasopharyngeal  Result Value Ref Range Status   MRSA by PCR NEGATIVE NEGATIVE Final    Comment:        The GeneXpert MRSA Assay (FDA approved for NASAL specimens only), is one component of a comprehensive MRSA colonization surveillance program. It is not intended to diagnose MRSA infection nor to guide or monitor treatment for MRSA infections. Performed at [facility], [place] [address], [place], [place] [address]     Sepsis Labs: Invalid input(s): PROCALCITONIN,  LACTICIDVEN  Urine analysis:    Component Value Date/Time   COLORURINE YELLOW 09/29/2018 0851   APPEARANCEUR CLOUDY (A) 09/29/2018 0851   LABSPEC 1.015 09/29/2018 0851   PHURINE 9.0 (H) 09/29/2018 0851   GLUCOSEU NEGATIVE 09/29/2018 0851   HGBUR NEGATIVE 09/29/2018 0851   BILIRUBINUR NEGATIVE 09/29/2018 0851   KETONESUR 20 (A) 09/29/2018 0851   PROTEINUR 30 (A) 09/29/2018 0851   UROBILINOGEN 0.2 05/20/2007 0952   NITRITE NEGATIVE 09/29/2018 0851   LEUKOCYTESUR SMALL (A) 09/29/2018 0851    Anemia Panel: No results for input(s): VITAMINB12, FOLATE, FERRITIN, TIBC, IRON, RETICCTPCT in the last 72 hours.  Thyroid Function Tests: Recent Labs    09/29/18 0210  TSH 0.612    Lipid Profile: No results for input(s): CHOL, HDL, LDLCALC, TRIG, CHOLHDL, LDLDIRECT in the last 72 hours.  CBG: No results for input(s): GLUCAP in the last 168 hours.  HbA1C: No results for input(s): HGBA1C in the last 72 hours.  BNP (last 3 results): No results for input(s): PROBNP in the last 8760 hours.  Cardiac Enzymes: No results for input(s): CKTOTAL, CKMB, CKMBINDEX, TROPONINI in the last 168 hours.  Coagulation Profile: No  results for input(s): INR, PROTIME in the last 168 hours.  Liver Function Tests: Recent Labs  Lab 09/28/18 1853 09/29/18 0210  AST 17 15  ALT 14 14  ALKPHOS 54 49  BILITOT 1.1 0.9  PROT 7.0 6.9  ALBUMIN 3.2* 3.0*   Recent Labs  Lab 09/28/18 1853  LIPASE 19   No results for input(s): AMMONIA in the last 168 hours.  Basic Metabolic Panel: Recent Labs  Lab 09/28/18 1853 09/29/18 0210  NA 126* 125*  K 4.0 4.0  CL 75* 76*  CO2 42* 40*  GLUCOSE 82 117*  BUN 9 8  CREATININE 0.43* 0.41*  CALCIUM 9.0 8.7*  MG 1.7 1.7  PHOS 2.3* 2.8   GFR: Estimated Creatinine Clearance: 71.9 mL/min (A) (by C-G formula based on SCr of 0.41 mg/dL (L)).  CBC: Recent Labs  Lab 09/28/18 1853 09/29/18 0210  WBC 23.8* 24.8*  HGB 11.7* 12.1*  HCT 38.6* 38.8*  MCV 93.5  91.3  PLT 293 254    Procedures:  None  Microbiology summarized: 7/7: COVID-19 negative. 7/7: Blood cultures negative so far. 7/7-MRSA PCR negative 7/7 sputum culture pending.  Assessment & Plan: Aspiration pneumonia:  -Patient with respiratory symptoms.  History of dysphagia. -Imaging including portable CXR and CT A/P concerning for bilateral aspiration pneumonia -Vancomycin/Zosyn 7/7-7/8 -Narrowed to Unasyn 7/8 -Add doxycycline. -Follow urine Legionella and pneumonia  COPD exacerbation: Has no formal diagnosis but on 3 L at baseline.  Significant smoking history.  Has cardinal symptoms which could also be due to the above.  -Continue Brovana.  Add Pulmicort. -We will add systemic steroid if no improvement.  Since he was on a steroid recently. -PRN DuoNeb  Dysphagia: Known history of this.  Reportedly likes to continue eating despite aspiration risk. -Appreciate SLP input -Resume tube feeding  Leukocytosis: Likely due to #1 above -Continue trending  Hyponatremia likely chronic.  Slightly worse this morning.  Sodium 131 in January 2020 -Follow urine chemistry  Severe protein calorie malnutrition: Patient with dysphagia.  Tube dependent but also takes orally.  BMI 17.23. -Dietitian consulted-appreciate input  Chronic pain/debility -Continue home methadone -PT/OT consult  PVD: -Continue home Plavix and aspirin  DVT prophylaxis: Start subcu Lovenox Code Status: DNI Family Communication: Pending Disposition Plan: Remains inpatient pending clinical improvement. Consultants: None  Antimicrobials: Anti-infectives (From admission, onward)   Start     Dose/Rate Route Frequency Ordered Stop   09/29/18 1300  Ampicillin-Sulbactam (UNASYN) 3 g in sodium chloride 0.9 % 100 mL IVPB     3 g 200 mL/hr over 30 Minutes Intravenous Every 6 hours 09/29/18 0937     09/29/18 1000  vancomycin (VANCOCIN) IVPB 750 mg/150 ml premix  Status:  Discontinued     750 mg 150 mL/hr over 60  Minutes Intravenous Every 12 hours 09/29/18 0157 09/29/18 0757   09/29/18 0600  piperacillin-tazobactam (ZOSYN) IVPB 3.375 g  Status:  Discontinued     3.375 g 12.5 mL/hr over 240 Minutes Intravenous Every 8 hours 09/29/18 0157 09/29/18 0937   09/28/18 2315  piperacillin-tazobactam (ZOSYN) IVPB 3.375 g     3.375 g 100 mL/hr over 30 Minutes Intravenous  Once 09/28/18 2302 09/29/18 0004   09/28/18 2315  vancomycin (VANCOCIN) IVPB 1000 mg/200 mL premix     1,000 mg 200 mL/hr over 60 Minutes Intravenous  Once 09/28/18 2305 09/29/18 0004      Sch Meds:  Scheduled Meds:  arformoterol  15 mcg Nebulization Q12H   aspirin  81 mg Oral Daily  clopidogrel  75 mg Oral Daily   feeding supplement (ENSURE ENLIVE)  237 mL Oral BID BM   feeding supplement (JEVITY 1.2 CAL)  474 mL Per Tube TID   mouth rinse  15 mL Mouth Rinse BID   methadone  115 mg Per Tube Daily   senna  1 tablet Oral BID   sodium chloride flush  3 mL Intravenous Once   Continuous Infusions:  ampicillin-sulbactam (UNASYN) IV     PRN Meds:.acetaminophen **OR** acetaminophen, albuterol, bisacodyl, guaiFENesin-dextromethorphan, HYDROcodone-acetaminophen, ondansetron **OR** ondansetron (ZOFRAN) IV, polyethylene glycol, Resource ThickenUp Clear   Yoanna Jurczyk T. Conger  If 7PM-7AM, please contact night-coverage [email or web address] Password [email or web address] 09/29/2018, 1:41 PM

## 2018-09-29 NOTE — Evaluation (Signed)
Physical Therapy Evaluation Patient Details Name: Tony Berry MRN: [id number] DOB: 06/08/1960 Today's Date: 09/29/2018   History of Present Illness  58 year old male with history of head and neck cancer status post radiation, dysphagia, severe protein calorie malnutrition, chronic respiratory failure on 3 L at baseline, HTN, hyponatremia and PVD presenting with abdominal pain and concern about feeding tube dislodging after mechanical fall at home 3 days prior. Pt found to have aspiration pneumonia.     Clinical Impression  Pt admitted with above diagnosis. Pt currently with functional limitations due to the deficits listed below (see PT Problem List). PTA, pt living at home with wife. Today, ambulating without assistance. desats quickly on 4L, returns at rest. Will cont to follow for safe progression home.  Pt will benefit from skilled PT to increase their independence and safety with mobility to allow discharge to the venue listed below.       Follow Up Recommendations No PT follow up;Supervision for mobility/OOB    Equipment Recommendations  None recommended by PT    Recommendations for Other Services       Precautions / Restrictions Precautions Precautions: Fall Precaution Comments: watch O2 sats Restrictions Weight Bearing Restrictions: No      Mobility  Bed Mobility               General bed mobility comments: seated EOB upon arrival  Transfers Overall transfer level: Needs assistance Equipment used: None Transfers: Sit to/from Stand Sit to Stand: Supervision         General transfer comment: for lines/safety  Ambulation/Gait Ambulation/Gait assistance: Supervision Gait Distance (Feet): 20 Feet Assistive device: None Gait Pattern/deviations: WFL(Within Functional Limits) Gait velocity: decreased      Stairs            Wheelchair Mobility    Modified Rankin (Stroke Patients Only)       Balance Overall balance assessment: Mild deficits  observed, not formally tested                                           Pertinent Vitals/Pain Pain Assessment: Faces Faces Pain Scale: Hurts little more Pain Location: abdominal pain Pain Descriptors / Indicators: Discomfort Pain Intervention(s): Monitored during session;Limited activity within patient's tolerance    Home Living Family/patient expects to be discharged to:: Private residence Living Arrangements: Spouse/significant other Available Help at Discharge: Family;Available 24 hours/day Type of Home: Mobile home Home Access: Stairs to enter Entrance Stairs-Rails: Can reach both;Right;Left Entrance Stairs-Number of Steps: 4 Home Layout: One level Home Equipment: Walker - 2 wheels;Cane - single point;Walker - 4 wheels;Shower seat;Bedside commode      Prior Function Level of Independence: Independent               Hand Dominance   Dominant Hand: Right    Extremity/Trunk Assessment   Upper Extremity Assessment Upper Extremity Assessment: Generalized weakness    Lower Extremity Assessment Lower Extremity Assessment: Generalized weakness    Cervical / Trunk Assessment Cervical / Trunk Assessment: Kyphotic  Communication   Communication: Expressive difficulties(soft spoken)  Cognition Arousal/Alertness: Awake/alert Behavior During Therapy: WFL for tasks assessed/performed Overall Cognitive Status: Within Functional Limits for tasks assessed                                 General Comments: overall WFL,  however RN reports upon entering room pt not wearing his O2 and unaware       General Comments General comments (skin integrity, edema, etc.): walkign on 4L dips to 86%, returns to 92% resting.     Exercises     Assessment/Plan    PT Assessment Patient needs continued PT services  PT Problem List Decreased strength       PT Treatment Interventions DME instruction;Gait training;Functional mobility training;Stair  training;Therapeutic activities;Therapeutic exercise    PT Goals (Current goals can be found in the Care Plan section)  Acute Rehab PT Goals Patient Stated Goal: none stated, agreeable to working with therapies PT Goal Formulation: With patient Time For Goal Achievement: 10/13/18 Potential to Achieve Goals: Fair    Frequency Min 3X/week   Barriers to discharge Decreased caregiver support      Co-evaluation               AM-PAC PT "6 Clicks" Mobility  Outcome Measure Help needed turning from your back to your side while in a flat bed without using bedrails?: None Help needed moving from lying on your back to sitting on the side of a flat bed without using bedrails?: None Help needed moving to and from a bed to a chair (including a wheelchair)?: A Little Help needed standing up from a chair using your arms (e.g., wheelchair or bedside chair)?: A Little Help needed to walk in hospital room?: A Little Help needed climbing 3-5 steps with a railing? : A Little 6 Click Score: 20    End of Session Equipment Utilized During Treatment: Gait belt;Oxygen Activity Tolerance: Patient tolerated treatment well Patient left: in bed Nurse Communication: Mobility status PT Visit Diagnosis: Unsteadiness on feet (R26.81)    Time: 5498-2641 PT Time Calculation (min) (ACUTE ONLY): 26 min   Charges:   PT Evaluation $PT Eval Moderate Complexity: 1 Mod PT Treatments $Gait Training: 8-22 mins        Reinaldo Berber, PT, DPT Acute Rehabilitation Services Pager: [phone] Office: [phone]    Reinaldo Berber 09/29/2018, 4:34 PM

## 2018-09-29 NOTE — Progress Notes (Signed)
Pharmacy Antibiotic Note  Tony Berry is a 58 y.o. male admitted on 09/28/2018 with PNA.  Pharmacy has been consulted for Vancomycin and Zosyn  Dosing.  Vancomycin 1 g IV given in ED at  2320  Plan: Vancomycin 750 mg IV q12h Zosyn 3.375 g IV q8h   Height: 5\' 7"  (170.2 cm) Weight: 110 lb 0.2 oz (49.9 kg) IBW/kg (Calculated) : 66.1  Temp (24hrs), Avg:98.1 F (36.7 C), Min:98 F (36.7 C), Max:98.1 F (36.7 C)  Recent Labs  Lab 09/28/18 1853 09/28/18 2121  WBC 23.8*  --   CREATININE 0.43*  --   LATICACIDVEN  --  0.7    Estimated Creatinine Clearance: 71.9 mL/min (A) (by C-G formula based on SCr of 0.43 mg/dL (L)).    No Known Allergies   Caryl Pina 09/29/2018 1:53 AM

## 2018-09-30 LAB — CBC WITH DIFFERENTIAL/PLATELET
Abs Immature Granulocytes: 0 10*3/uL (ref 0.00–0.07)
Basophils Absolute: 0 10*3/uL (ref 0.0–0.1)
Basophils Relative: 0 %
Eosinophils Absolute: 0.3 10*3/uL (ref 0.0–0.5)
Eosinophils Relative: 1 %
HCT: 36.3 % — ABNORMAL LOW (ref 39.0–52.0)
Hemoglobin: 11.6 g/dL — ABNORMAL LOW (ref 13.0–17.0)
Lymphocytes Relative: 0 %
Lymphs Abs: 0 10*3/uL — ABNORMAL LOW (ref 0.7–4.0)
MCH: 29.1 pg (ref 26.0–34.0)
MCHC: 32 g/dL (ref 30.0–36.0)
MCV: 91 fL (ref 80.0–100.0)
Monocytes Absolute: 0.9 10*3/uL (ref 0.1–1.0)
Monocytes Relative: 3 %
Neutro Abs: 30.1 10*3/uL — ABNORMAL HIGH (ref 1.7–7.7)
Neutrophils Relative %: 96 %
Platelets: 258 10*3/uL (ref 150–400)
RBC: 3.99 MIL/uL — ABNORMAL LOW (ref 4.22–5.81)
RDW: 13.5 % (ref 11.5–15.5)
WBC: 31.4 10*3/uL — ABNORMAL HIGH (ref 4.0–10.5)
nRBC: 0 % (ref 0.0–0.2)
nRBC: 0 /100 WBC

## 2018-09-30 LAB — BASIC METABOLIC PANEL
Anion gap: 8 (ref 5–15)
BUN: 8 mg/dL (ref 6–20)
CO2: 36 mmol/L — ABNORMAL HIGH (ref 22–32)
Calcium: 8.5 mg/dL — ABNORMAL LOW (ref 8.9–10.3)
Chloride: 80 mmol/L — ABNORMAL LOW (ref 98–111)
Creatinine, Ser: 0.37 mg/dL — ABNORMAL LOW (ref 0.61–1.24)
GFR calc Af Amer: >60 mL/min (ref >60)
GFR calc non Af Amer: >60 mL/min (ref >60)
Glucose, Bld: 149 mg/dL — ABNORMAL HIGH (ref 70–99)
Potassium: 3.9 mmol/L (ref 3.5–5.1)
Sodium: 124 mmol/L — ABNORMAL LOW (ref 135–145)

## 2018-09-30 LAB — GLUCOSE, CAPILLARY
Glucose-Capillary: 182 mg/dL — ABNORMAL HIGH (ref 70–99)
Glucose-Capillary: 149 mg/dL — ABNORMAL HIGH (ref 70–99)
Glucose-Capillary: 138 mg/dL — ABNORMAL HIGH (ref 70–99)
Glucose-Capillary: 158 mg/dL — ABNORMAL HIGH (ref 70–99)
Glucose-Capillary: 149 mg/dL — ABNORMAL HIGH (ref 70–99)

## 2018-09-30 LAB — CBC
HCT: 36.4 % — ABNORMAL LOW (ref 39.0–52.0)
Hemoglobin: 11.5 g/dL — ABNORMAL LOW (ref 13.0–17.0)
MCH: 28.7 pg (ref 26.0–34.0)
MCHC: 31.6 g/dL (ref 30.0–36.0)
MCV: 90.8 fL (ref 80.0–100.0)
Platelets: 244 10*3/uL (ref 150–400)
RBC: 4.01 MIL/uL — ABNORMAL LOW (ref 4.22–5.81)
RDW: 13.5 % (ref 11.5–15.5)
WBC: 32 10*3/uL — ABNORMAL HIGH (ref 4.0–10.5)
nRBC: 0 % (ref 0.0–0.2)

## 2018-09-30 LAB — LEGIONELLA PNEUMOPHILA SEROGP 1 UR AG: L. pneumophila Serogp 1 Ur Ag: NEGATIVE

## 2018-09-30 LAB — CORTISOL-AM, BLOOD: Cortisol - AM: 22.3 ug/dL (ref 6.7–22.6)

## 2018-09-30 MED ORDER — DEMECLOCYCLINE HCL 150 MG PO TABS
300.0000 mg | ORAL_TABLET | Freq: Two times a day (BID) | ORAL | Status: DC
  Administered 2018-09-30 – 2018-10-01 (×3): 300 mg via ORAL
  Filled 2018-09-30 (×5): qty 2

## 2018-09-30 MED ORDER — SALINE SPRAY 0.65 % NA SOLN
1.0000 | NASAL | Status: DC | PRN
  Administered 2018-09-30: 1 via NASAL
  Filled 2018-09-30: qty 44

## 2018-09-30 MED ORDER — NICOTINE 21 MG/24HR TD PT24
21.0000 mg | MEDICATED_PATCH | Freq: Every day | TRANSDERMAL | Status: DC
  Administered 2018-09-30 – 2018-10-02 (×3): 21 mg via TRANSDERMAL
  Filled 2018-09-30 (×3): qty 1

## 2018-09-30 NOTE — Progress Notes (Signed)
SLP Cancellation Note  Patient Details Name: ABDURAHMAN RUGG MRN: [id number] DOB: Feb 06, 1961   Cancelled treatment:       Reason Eval/Treat Not Completed: Fatigue/lethargy limiting ability to participate. Sleeping soundly, prepared nectar thick liquids at bedside. Will f/u tomorrow for needs.    Katlin Bortner, Katherene Ponto 09/30/2018, 12:59 PM

## 2018-09-30 NOTE — Progress Notes (Signed)
Pt refusing to wear his nasal cannula because his nose is stuffy and upset about not having his underwear from home. Told patient his brother could bring his underwear up for him but we aren't having visitors.  RN educated pt about safety and need for oxygen.  Will page MD about nasal congestion.

## 2018-09-30 NOTE — Progress Notes (Signed)
PROGRESS NOTE  Tony Berry:[id number] DOB: [birth year]   PCP: Cher Nakai, MD  Patient is from: Home.  Lives with family, wife.  DOA: 09/28/2018 LOS: 2  Brief Narrative / Interim history: 58 year old male with history of head and neck cancer status post radiation, dysphagia, severe protein calorie malnutrition, chronic respiratory failure on 3 L at baseline, HTN, hyponatremia and PVD presenting with abdominal pain and concern about feeding tube dislodging after mechanical fall at home 3 days ago.  Imaging in ED concerning for bilateral aspiration pneumonia.  COVID-19 negative.  Admitted on vancomycin and Zosyn, transitioned to ceftriaxone and azithromycin.  Subjective: No major events overnight of this morning.  Reports improvement in his breathing.  Endorses vague pain across upper abdomen.  Continues to endorse productive cough.  Objective: Vitals:   09/29/18 2341 09/30/18 0500 09/30/18 0807 09/30/18 0822  BP:   132/60   Pulse:   77   Resp:      Temp:   98.6 F (37 C)   TempSrc:   Oral   SpO2: 94%  97% 92%  Weight:  49 kg    Height:        Intake/Output Summary (Last 24 hours) at 09/30/2018 1531 Last data filed at 09/30/2018 0615 Gross per 24 hour  Intake 1125.17 ml  Output -  Net 1125.17 ml   Filed Weights   09/29/18 0117 09/30/18 0500  Weight: 49.9 kg 49 kg    Examination:  GENERAL: Sitting on the edge of bed.  Appears frail and chronically ill. HEENT: MMM.  Vision and hearing grossly intact.  Temporal muscle mass wasting. NECK: Supple.  No apparent JVD but patient sitting upright..  LUNGS:  No IWOB.  Diminished aeration bilaterally. HEART:  RRR. Heart sounds normal.  ABD: Bowel sounds present. Soft. Non tender.  MSK/EXT:  Moves extremities.  Significant muscle mass wasting.  +1 edema bilaterally. SKIN: no apparent skin lesion or wound NEURO: Awake, alert and oriented appropriately.  No gross deficit.  PSYCH: Calm. Normal affect.   GENERAL: Sitting on  bedside chair.  Appears frail and chronically ill. HEENT: MMM.  Vision and hearing grossly intact.  Temporal muscle mass wasting. NECK: Supple.  No apparent JVD. LUNGS:  No IWOB.  Diminished aeration and rhonchi bilaterally. HEART:  RRR. Heart sounds normal.  ABD: Bowel sounds present. Soft. Non tender.  MSK/EXT:  Moves all extremities. No apparent deformity.  1+ edema bilaterally. SKIN: no apparent skin lesion or wound NEURO: Awake, alert and oriented appropriately.  No gross deficit.  PSYCH: Calm. Normal affect.  I have personally reviewed the following labs and images:  Radiology Studies: No results found.  Microbiology: Recent Results (from the past 240 hour(s))  SARS Coronavirus 2 (CEPHEID - Performed in [place] hospital lab), Hosp Order     Status: None   Collection Time: 09/28/18  9:00 PM   Specimen: Nasopharyngeal Swab  Result Value Ref Range Status   SARS Coronavirus 2 NEGATIVE NEGATIVE Final    Comment: (NOTE) If result is NEGATIVE SARS-CoV-2 target nucleic acids are NOT DETECTED. The SARS-CoV-2 RNA is generally detectable in upper and lower  respiratory specimens during the acute phase of infection. The lowest  concentration of SARS-CoV-2 viral copies this assay can detect is 250  copies / mL. A negative result does not preclude SARS-CoV-2 infection  and should not be used as the sole basis for treatment or other  patient management decisions.  A negative result may occur with  improper specimen  collection / handling, submission of specimen other  than nasopharyngeal swab, presence of viral mutation(s) within the  areas targeted by this assay, and inadequate number of viral copies  (<250 copies / mL). A negative result must be combined with clinical  observations, patient history, and epidemiological information. If result is POSITIVE SARS-CoV-2 target nucleic acids are DETECTED. The SARS-CoV-2 RNA is generally detectable in upper and lower  respiratory  specimens dur ing the acute phase of infection.  Positive  results are indicative of active infection with SARS-CoV-2.  Clinical  correlation with patient history and other diagnostic information is  necessary to determine patient infection status.  Positive results do  not rule out bacterial infection or co-infection with other viruses. If result is PRESUMPTIVE POSTIVE SARS-CoV-2 nucleic acids MAY BE PRESENT.   A presumptive positive result was obtained on the submitted specimen  and confirmed on repeat testing.  While 2019 novel coronavirus  (SARS-CoV-2) nucleic acids may be present in the submitted sample  additional confirmatory testing may be necessary for epidemiological  and / or clinical management purposes  to differentiate between  SARS-CoV-2 and other Sarbecovirus currently known to infect humans.  If clinically indicated additional testing with an alternate test  methodology [phone]) is advised. The SARS-CoV-2 RNA is generally  detectable in upper and lower respiratory sp ecimens during the acute  phase of infection. The expected result is Negative. Fact Sheet for Patients:  StrictlyIdeas.no Fact Sheet for Healthcare Providers: BankingDealers.co.za This test is not yet approved or cleared by the [place] FDA and has been authorized for detection and/or diagnosis of SARS-CoV-2 by FDA under an Emergency Use Authorization (EUA).  This EUA will remain in effect (meaning this test can be used) for the duration of the COVID-19 declaration under Section 564(b)(1) of the Act, 21 U.S.C. section 360bbb-3(b)(1), unless the authorization is terminated or revoked sooner. Performed at [facility], [place] [address], [place], [place] [address]   Culture, blood (routine x 2)     Status: None (Preliminary result)   Collection Time: 09/28/18  9:15 PM   Specimen: BLOOD  Result Value Ref Range Status   Specimen Description BLOOD  RIGHT ARM  Final   Special Requests   Final    BOTTLES DRAWN AEROBIC AND ANAEROBIC Blood Culture results may not be optimal due to an inadequate volume of blood received in culture bottles   Culture   Final    NO GROWTH 2 DAYS Performed at [facility], [place] [address] [place], [place] [address]    Report Status PENDING  Incomplete  Culture, blood (routine x 2)     Status: None (Preliminary result)   Collection Time: 09/28/18  9:21 PM   Specimen: BLOOD  Result Value Ref Range Status   Specimen Description BLOOD RIGHT FOREARM  Final   Special Requests   Final    BOTTLES DRAWN AEROBIC AND ANAEROBIC Blood Culture results may not be optimal due to an inadequate volume of blood received in culture bottles   Culture   Final    NO GROWTH 2 DAYS Performed at [facility], [place] [address] [place], [place] [address]    Report Status PENDING  Incomplete  MRSA PCR Screening     Status: None   Collection Time: 09/29/18  1:20 AM   Specimen: Nasal Mucosa; Nasopharyngeal  Result Value Ref Range Status   MRSA by PCR NEGATIVE NEGATIVE Final    Comment:  The GeneXpert MRSA Assay (FDA approved for NASAL specimens only), is one component of a comprehensive MRSA colonization surveillance program. It is not intended to diagnose MRSA infection nor to guide or monitor treatment for MRSA infections. Performed at [facility], [place] [address], [place],  [address]     Sepsis Labs: Invalid input(s): PROCALCITONIN, LACTICIDVEN  Urine analysis:    Component Value Date/Time   COLORURINE YELLOW 09/29/2018 0851   APPEARANCEUR CLOUDY (A) 09/29/2018 0851   LABSPEC 1.015 09/29/2018 0851   PHURINE 9.0 (H) 09/29/2018 0851   GLUCOSEU NEGATIVE 09/29/2018 0851   HGBUR NEGATIVE 09/29/2018 0851   BILIRUBINUR NEGATIVE 09/29/2018 0851   KETONESUR 20 (A) 09/29/2018 0851   PROTEINUR 30 (A) 09/29/2018 0851   UROBILINOGEN 0.2 05/20/2007 0952   NITRITE NEGATIVE 09/29/2018 0851    LEUKOCYTESUR SMALL (A) 09/29/2018 0851    Anemia Panel: No results for input(s): VITAMINB12, FOLATE, FERRITIN, TIBC, IRON, RETICCTPCT in the last 72 hours.  Thyroid Function Tests: Recent Labs    09/29/18 0210  TSH 0.612    Lipid Profile: No results for input(s): CHOL, HDL, LDLCALC, TRIG, CHOLHDL, LDLDIRECT in the last 72 hours.  CBG: Recent Labs  Lab 09/29/18 1928 09/29/18 2312 09/30/18 0312 09/30/18 0808 09/30/18 1121  GLUCAP 126* 128* 182* 149* 138*    HbA1C: No results for input(s): HGBA1C in the last 72 hours.  BNP (last 3 results): No results for input(s): PROBNP in the last 8760 hours.  Cardiac Enzymes: No results for input(s): CKTOTAL, CKMB, CKMBINDEX, TROPONINI in the last 168 hours.  Coagulation Profile: No results for input(s): INR, PROTIME in the last 168 hours.  Liver Function Tests: Recent Labs  Lab 09/28/18 1853 09/29/18 0210  AST 17 15  ALT 14 14  ALKPHOS 54 49  BILITOT 1.1 0.9  PROT 7.0 6.9  ALBUMIN 3.2* 3.0*   Recent Labs  Lab 09/28/18 1853  LIPASE 19   No results for input(s): AMMONIA in the last 168 hours.  Basic Metabolic Panel: Recent Labs  Lab 09/28/18 1853 09/29/18 0210 09/30/18 0519  NA 126* 125* 124*  K 4.0 4.0 3.9  CL 75* 76* 80*  CO2 42* 40* 36*  GLUCOSE 82 117* 149*  BUN 9 8 8   CREATININE 0.43* 0.41* 0.37*  CALCIUM 9.0 8.7* 8.5*  MG 1.7 1.7  --   PHOS 2.3* 2.8  --    GFR: Estimated Creatinine Clearance: 70.6 mL/min (A) (by C-G formula based on SCr of 0.37 mg/dL (L)).  CBC: Recent Labs  Lab 09/28/18 1853 09/29/18 0210 09/30/18 0519  WBC 23.8* 24.8* 31.4*  32.0*  NEUTROABS  --   --  30.1*  HGB 11.7* 12.1* 11.6*  11.5*  HCT 38.6* 38.8* 36.3*  36.4*  MCV 93.5 91.3 91.0  90.8  PLT 293 254 258  244    Procedures:  None  Microbiology summarized: 7/7: COVID-19 negative. 7/7: Blood cultures negative so far. 7/7-MRSA PCR negative 7/7 sputum culture pending.  Assessment & Plan: Aspiration  pneumonia:  -Patient with respiratory symptoms.  History of dysphagia. -Imaging including portable CXR and CT A/P concerning for bilateral aspiration pneumonia -COVID-19, MRSA PCR and blood cultures negative.  -Vancomycin/Zosyn 7/7-7/8 -Narrowed to Unasyn and azithromycin 7/8--  COPD exacerbation: Has no formal diagnosis but on 3 L at baseline.  Significant smoking history.  Has cardinal symptoms which could also be due to the above.  -Continue Brovana.  Add Pulmicort. -We will add systemic steroid if no improvement.  He was  on steroids recently. -PRN DuoNeb  Dysphagia: Known history of this.  Reportedly likes to continue eating despite aspiration risk.  He is DNI.  Voices understanding the risk. -Appreciate SLP input Angelita Ingles per dietitian  Leukocytosis/bandemia: Likely due to #1 above but cannot exclude malignancy. -We will consult ID and/or heme-onc if no improvement  Hyponatremia likely chronic.  Slightly worse again this morning.  Sodium 131 in January 2020 -Urine chemistry consistent with SIADH. -Difficult to implement fluid restriction as he is on feeding tube.  Started demeclocycline. -Continue monitoring  Severe protein calorie malnutrition: Patient with dysphagia.  Tube dependent but also takes orally.  BMI 17.23. -Dietitian consulted-appreciate input  Chronic pain/debility -Continue home methadone -PT/OT consult  PVD: -Continue home Plavix and aspirin  DVT prophylaxis: Start subcu Lovenox Code Status: DNI Family Communication: per patient and/or RN.  Available if any question Disposition Plan: Remains inpatient due to hyponatremia, significant leukocytosis and aspiration pneumonia. Consultants: None  Antimicrobials: Anti-infectives (From admission, onward)   Start     Dose/Rate Route Frequency Ordered Stop   09/30/18 1000  azithromycin (ZITHROMAX) tablet 250 mg     250 mg Oral Daily 09/29/18 1413 10/04/18 0959   09/30/18 1000  demeclocycline (DECLOMYCIN) tablet  300 mg     300 mg Oral Every 12 hours 09/30/18 0729     09/29/18 1415  doxycycline (VIBRA-TABS) tablet 100 mg  Status:  Discontinued     100 mg Oral Every 12 hours 09/29/18 1407 09/29/18 1412   09/29/18 1415  azithromycin (ZITHROMAX) tablet 500 mg     500 mg Oral Daily 09/29/18 1413 09/29/18 1749   09/29/18 1300  Ampicillin-Sulbactam (UNASYN) 3 g in sodium chloride 0.9 % 100 mL IVPB     3 g 200 mL/hr over 30 Minutes Intravenous Every 6 hours 09/29/18 0937     09/29/18 1000  vancomycin (VANCOCIN) IVPB 750 mg/150 ml premix  Status:  Discontinued     750 mg 150 mL/hr over 60 Minutes Intravenous Every 12 hours 09/29/18 0157 09/29/18 0757   09/29/18 0600  piperacillin-tazobactam (ZOSYN) IVPB 3.375 g  Status:  Discontinued     3.375 g 12.5 mL/hr over 240 Minutes Intravenous Every 8 hours 09/29/18 0157 09/29/18 0937   09/28/18 2315  piperacillin-tazobactam (ZOSYN) IVPB 3.375 g     3.375 g 100 mL/hr over 30 Minutes Intravenous  Once 09/28/18 2302 09/29/18 0004   09/28/18 2315  vancomycin (VANCOCIN) IVPB 1000 mg/200 mL premix     1,000 mg 200 mL/hr over 60 Minutes Intravenous  Once 09/28/18 2305 09/29/18 0004      Sch Meds:  Scheduled Meds: . arformoterol  15 mcg Nebulization Q12H  . aspirin  81 mg Oral Daily  . azithromycin  250 mg Oral Daily  . budesonide (PULMICORT) nebulizer solution  0.5 mg Nebulization BID  . clopidogrel  75 mg Oral Daily  . demeclocycline  300 mg Oral Q12H  . feeding supplement (ENSURE ENLIVE)  237 mL Oral BID BM  . mouth rinse  15 mL Mouth Rinse BID  . methadone  115 mg Per Tube Daily  . nicotine  21 mg Transdermal Daily  . senna  1 tablet Oral BID  . sodium chloride flush  3 mL Intravenous Once   Continuous Infusions: . ampicillin-sulbactam (UNASYN) IV 3 g (09/30/18 1334)  . feeding supplement (JEVITY 1.2 CAL) 50 mL/hr at 09/30/18 0600   PRN Meds:.acetaminophen **OR** acetaminophen, bisacodyl, guaiFENesin-dextromethorphan, HYDROcodone-acetaminophen,  ipratropium-albuterol, ondansetron **OR** ondansetron (ZOFRAN) IV, polyethylene glycol, Resource ThickenUp  Clear   Taye T. Chamberlayne  If 7PM-7AM, please contact night-coverage [email or web address] Password [email or web address] 09/30/2018, 3:31 PM

## 2018-09-30 NOTE — Progress Notes (Signed)
PT Cancellation Note  Patient Details Name: Tony Berry MRN: [id number] DOB: Dec 14, 1960   Cancelled Treatment:    Reason Eval/Treat Not Completed: Patient declined, no reason specified   Shary Decamp [facility] 09/30/2018, 3:53 PM [place] Pager [phone] Office [phone]

## 2018-10-01 DIAGNOSIS — J9621 Acute and chronic respiratory failure with hypoxia: Secondary | ICD-10-CM

## 2018-10-01 LAB — BASIC METABOLIC PANEL
Anion gap: 9 (ref 5–15)
BUN: 8 mg/dL (ref 6–20)
CO2: 38 mmol/L — ABNORMAL HIGH (ref 22–32)
Calcium: 8.2 mg/dL — ABNORMAL LOW (ref 8.9–10.3)
Chloride: 81 mmol/L — ABNORMAL LOW (ref 98–111)
Creatinine, Ser: 0.31 mg/dL — ABNORMAL LOW (ref 0.61–1.24)
GFR calc Af Amer: >60 mL/min (ref >60)
GFR calc non Af Amer: >60 mL/min (ref >60)
Glucose, Bld: 118 mg/dL — ABNORMAL HIGH (ref 70–99)
Potassium: 4.1 mmol/L (ref 3.5–5.1)
Sodium: 128 mmol/L — ABNORMAL LOW (ref 135–145)

## 2018-10-01 LAB — GLUCOSE, CAPILLARY
Glucose-Capillary: 104 mg/dL — ABNORMAL HIGH (ref 70–99)
Glucose-Capillary: 117 mg/dL — ABNORMAL HIGH (ref 70–99)
Glucose-Capillary: 166 mg/dL — ABNORMAL HIGH (ref 70–99)
Glucose-Capillary: 174 mg/dL — ABNORMAL HIGH (ref 70–99)
Glucose-Capillary: 241 mg/dL — ABNORMAL HIGH (ref 70–99)
Glucose-Capillary: 244 mg/dL — ABNORMAL HIGH (ref 70–99)
Glucose-Capillary: 254 mg/dL — ABNORMAL HIGH (ref 70–99)

## 2018-10-01 LAB — CBC
HCT: 33.8 % — ABNORMAL LOW (ref 39.0–52.0)
Hemoglobin: 10.3 g/dL — ABNORMAL LOW (ref 13.0–17.0)
MCH: 29 pg (ref 26.0–34.0)
MCHC: 30.5 g/dL (ref 30.0–36.0)
MCV: 95.2 fL (ref 80.0–100.0)
Platelets: 183 10*3/uL (ref 150–400)
RBC: 3.55 MIL/uL — ABNORMAL LOW (ref 4.22–5.81)
RDW: 13.5 % (ref 11.5–15.5)
WBC: 24.9 10*3/uL — ABNORMAL HIGH (ref 4.0–10.5)
nRBC: 0 % (ref 0.0–0.2)

## 2018-10-01 MED ORDER — FUROSEMIDE 20 MG PO TABS: 20.0000 mg | ORAL_TABLET | Freq: Two times a day (BID) | ORAL | 0 refills | Status: AC

## 2018-10-01 MED ORDER — SODIUM CHLORIDE 1 G PO TABS
1.0000 g | ORAL_TABLET | Freq: Two times a day (BID) | ORAL | Status: DC
  Administered 2018-10-01 – 2018-10-02 (×2): 1 g via ORAL
  Filled 2018-10-01 (×2): qty 1

## 2018-10-01 MED ORDER — AMOXICILLIN-POT CLAVULANATE 875-125 MG PO TABS
1.0000 | ORAL_TABLET | Freq: Two times a day (BID) | ORAL | Status: DC
  Administered 2018-10-01 – 2018-10-02 (×3): 1 via ORAL
  Filled 2018-10-01 (×3): qty 1

## 2018-10-01 MED ORDER — SODIUM CHLORIDE 1 G PO TABS: 1.0000 g | ORAL_TABLET | Freq: Two times a day (BID) | ORAL | 1 refills | Status: DC

## 2018-10-01 MED ORDER — STIOLTO RESPIMAT 2.5-2.5 MCG/ACT IN AERS: 2.0000 | INHALATION_SPRAY | Freq: Every day | RESPIRATORY_TRACT | 1 refills | Status: DC

## 2018-10-01 MED ORDER — AZITHROMYCIN 250 MG PO TABS: 250.0000 mg | ORAL_TABLET | Freq: Every day | ORAL | 0 refills | Status: AC

## 2018-10-01 MED ORDER — UMECLIDINIUM-VILANTEROL 62.5-25 MCG/INH IN AEPB
1.0000 | INHALATION_SPRAY | Freq: Every day | RESPIRATORY_TRACT | Status: DC
  Administered 2018-10-02: 1 via RESPIRATORY_TRACT
  Filled 2018-10-01: qty 14

## 2018-10-01 MED ORDER — ANORO ELLIPTA 62.5-25 MCG/INH IN AEPB: 1.0000 | INHALATION_SPRAY | Freq: Every day | RESPIRATORY_TRACT | 0 refills | Status: DC

## 2018-10-01 MED ORDER — INSULIN ASPART 100 UNIT/ML ~~LOC~~ SOLN
3.0000 [IU] | Freq: Once | SUBCUTANEOUS | Status: AC
  Administered 2018-10-01: 3 [IU] via SUBCUTANEOUS

## 2018-10-01 MED ORDER — AMOXICILLIN-POT CLAVULANATE 875-125 MG PO TABS: 1.0000 | ORAL_TABLET | Freq: Two times a day (BID) | ORAL | 0 refills | Status: DC

## 2018-10-01 MED FILL — STIOLTO RESPIMAT INHAL SPRY: 2.5-2.5 | 30 days supply | Qty: 4 | Fill #0

## 2018-10-01 MED FILL — AZITHROMYCIN 250 MG TABLET: 250 | 2 days supply | Qty: 2 | Fill #0

## 2018-10-01 MED FILL — SODIUM CHLORIDE 1 GM TABLET: 1 | 15 days supply | Qty: 30 | Fill #0

## 2018-10-01 MED FILL — FUROSEMIDE 20 MG TAB: 20 | 90 days supply | Qty: 180 | Fill #0

## 2018-10-01 MED FILL — AMOX-CLAV 875-125 MG TABLET: 875-125 | 2 days supply | Qty: 3 | Fill #0

## 2018-10-01 NOTE — TOC Initial Note (Signed)
Transition of Care [facility]) - Initial/Assessment Note    Patient Details  Name: Tony Berry MRN: [id number] Date of Birth: [birth year]  Transition of [facility]) CM/SW Contact:    [name], RN Phone Number: 10/01/2018, 4:28 PM  Clinical Narrative:                 From home with spouse, he has home oxygen with [employer] 3 liters baseline, he states he has transportation when dc home with his brother and he will bring his oxygen tank with him to transport him home.  He has Medicaid with 3.00 copay. Per pt/ot eval no follow up needed.   Expected Discharge Plan: Home/Self Care Barriers to Discharge: No Barriers Identified   Patient Goals and CMS Choice Patient states their goals for this hospitalization and ongoing recovery are:: watch tv   Choice offered to / list presented to : NA  Expected Discharge Plan and Services Expected Discharge Plan: Home/Self Care In-house Referral: NA Discharge Planning Services: CM Consult Post Acute Care Choice: NA Living arrangements for the past 2 months: Single Family Home                 DME Arranged: (NA)         HH Arranged: NA          Prior Living Arrangements/Services Living arrangements for the past 2 months: Single Family Home Lives with:: Spouse Patient language and need for interpreter reviewed:: Yes Do you feel safe going back to the place where you live?: Yes      Need for Family Participation in Patient Care: Yes (Comment) Care giver support system in place?: Yes (comment)   Criminal Activity/Legal Involvement Pertinent to Current Situation/Hospitalization: No - Comment as needed  Activities of Daily Living Home Assistive Devices/Equipment: Cane (specify quad or straight), Oxygen, Walker (specify type) ADL Screening (condition at time of admission) Patient's cognitive ability adequate to safely complete daily activities?: Yes Is the patient deaf or have difficulty hearing?: No Does the patient have difficulty  seeing, even when wearing glasses/contacts?: No Does the patient have difficulty concentrating, remembering, or making decisions?: No Patient able to express need for assistance with ADLs?: Yes Does the patient have difficulty dressing or bathing?: No Independently performs ADLs?: Yes (appropriate for developmental age) Does the patient have difficulty walking or climbing stairs?: Yes Weakness of Legs: Both Weakness of Arms/Hands: Both  Permission Sought/Granted Permission sought to share information with : Family Supports Permission granted to share information with : Yes, Verbal Permission Granted  Share Information with NAME: Mrs Polan     Permission granted to share info w Relationship: spouse  Permission granted to share info w Contact Information: Mrs [place]  Emotional Assessment Appearance:: Appears stated age Attitude/Demeanor/Rapport: Gracious Affect (typically observed): Appropriate Orientation: : Oriented to Self, Oriented to Place, Oriented to  Time, Oriented to Situation   Psych Involvement: No (comment)  Admission diagnosis:  Hyponatremia [E87.1] Aspiration pneumonia, unspecified aspiration pneumonia type, unspecified laterality, unspecified part of lung ([place]) [J69.0] Patient Active Problem List   Diagnosis Date Noted  . Fall at home, initial encounter 09/28/2018  . Goals of care, counseling/discussion   . Palliative care encounter   . Chest discomfort 05/06/2018  . Dysphagia 04/08/2018  . Aspiration pneumonia ([place]) 04/04/2018  . Acute on chronic respiratory failure ([place]) 03/15/2018  . Carditis   . Troponin level elevated 03/11/2018  . Protein-calorie malnutrition, severe 03/11/2018  . Hyponatremia 03/11/2018  . Hypophosphatemia  03/11/2018  . Multifocal pneumonia 03/10/2018   PCP:  Cher Nakai, MD Pharmacy:   [place], [place] [address] Phone: [phone] Fax:  [place], [place] [address] [place] [address] Phone: [phone] Fax: [phone]     Social Determinants of Health (SDOH) Interventions    Readmission Risk Interventions Readmission Risk Prevention Plan 10/01/2018  Transportation Screening Complete  PCP or Specialist Appt within 3-5 Days Complete  HRI or [place] Complete  Social Work Consult for [place] Planning/Counseling Complete  Palliative Care Screening Complete  Medication Review Press photographer) Complete  Some recent data might be hidden

## 2018-10-01 NOTE — Progress Notes (Signed)
PROGRESS NOTE  Tony Berry DJT:[id number] DOB: 1960/11/16   PCP: Cher Nakai, MD  Patient is from: Home.  Lives with family, wife.  DOA: 09/28/2018 LOS: 3  Brief Narrative / Interim history: 58 year old male with history of head and neck cancer status post radiation, dysphagia, severe protein calorie malnutrition, chronic respiratory failure on 3 L at baseline, HTN, hyponatremia and PVD presenting with abdominal pain and concern about feeding tube dislodging after mechanical fall at home 3 days ago.  Imaging in ED concerning for bilateral aspiration pneumonia.  COVID-19 negative.  Admitted on vancomycin and Zosyn, transitioned to Unasyn and azithromycin.  Subjective: No major events overnight of this morning.  Reports improvement in his breathing and cough.  Denies chest pain. Hyponatremia improved but not quite back to baseline.  Objective: Vitals:   09/30/18 2100 10/01/18 0024 10/01/18 0733 10/01/18 0805  BP:  135/65  133/65  Pulse: 68 64  90  Resp: 16 (!) 21  19  Temp:  98 F (36.7 C)  98.2 F (36.8 C)  TempSrc:  Oral  Oral  SpO2: 98% 97% 90% 92%  Weight:      Height:        Intake/Output Summary (Last 24 hours) at 10/01/2018 1340 Last data filed at 10/01/2018 0844 Gross per 24 hour  Intake 680 ml  Output 250 ml  Net 430 ml   Filed Weights   09/29/18 0117 09/30/18 0500  Weight: 49.9 kg 49 kg    Examination:  GENERAL: No acute distress.  Appears frail and chronically ill. HEENT: MMM.  Vision and hearing grossly intact.  NECK: Supple.  No apparent JVD. LUNGS:  No IWOB.  Diminished aeration and rhonchi bilaterally. HEART:  RRR. Heart sounds normal.  ABD: Bowel sounds present. Soft. Non tender.  MSK/EXT:  Moves all extremities. No apparent deformity.  1+ edema bilaterally. SKIN: no apparent skin lesion or wound NEURO: Awake, alert and oriented appropriately.  No gross deficit.  PSYCH: Calm. Normal affect.  I have personally reviewed the following labs and  images:  Radiology Studies: No results found.  Microbiology: Recent Results (from the past 240 hour(s))  SARS Coronavirus 2 (CEPHEID - Performed in [place] hospital lab), Hosp Order     Status: None   Collection Time: 09/28/18  9:00 PM   Specimen: Nasopharyngeal Swab  Result Value Ref Range Status   SARS Coronavirus 2 NEGATIVE NEGATIVE Final    Comment: (NOTE) If result is NEGATIVE SARS-CoV-2 target nucleic acids are NOT DETECTED. The SARS-CoV-2 RNA is generally detectable in upper and lower  respiratory specimens during the acute phase of infection. The lowest  concentration of SARS-CoV-2 viral copies this assay can detect is 250  copies / mL. A negative result does not preclude SARS-CoV-2 infection  and should not be used as the sole basis for treatment or other  patient management decisions.  A negative result may occur with  improper specimen collection / handling, submission of specimen other  than nasopharyngeal swab, presence of viral mutation(s) within the  areas targeted by this assay, and inadequate number of viral copies  (<250 copies / mL). A negative result must be combined with clinical  observations, patient history, and epidemiological information. If result is POSITIVE SARS-CoV-2 target nucleic acids are DETECTED. The SARS-CoV-2 RNA is generally detectable in upper and lower  respiratory specimens dur ing the acute phase of infection.  Positive  results are indicative of active infection with SARS-CoV-2.  Clinical  correlation with patient history  and other diagnostic information is  necessary to determine patient infection status.  Positive results do  not rule out bacterial infection or co-infection with other viruses. If result is PRESUMPTIVE POSTIVE SARS-CoV-2 nucleic acids MAY BE PRESENT.   A presumptive positive result was obtained on the submitted specimen  and confirmed on repeat testing.  While 2019 novel coronavirus  (SARS-CoV-2) nucleic acids may  be present in the submitted sample  additional confirmatory testing may be necessary for epidemiological  and / or clinical management purposes  to differentiate between  SARS-CoV-2 and other Sarbecovirus currently known to infect humans.  If clinically indicated additional testing with an alternate test  methodology [phone]) is advised. The SARS-CoV-2 RNA is generally  detectable in upper and lower respiratory sp ecimens during the acute  phase of infection. The expected result is Negative. Fact Sheet for Patients:  StrictlyIdeas.no Fact Sheet for Healthcare Providers: BankingDealers.co.za This test is not yet approved or cleared by the [place] FDA and has been authorized for detection and/or diagnosis of SARS-CoV-2 by FDA under an Emergency Use Authorization (EUA).  This EUA will remain in effect (meaning this test can be used) for the duration of the COVID-19 declaration under Section 564(b)(1) of the Act, 21 U.S.C. section 360bbb-3(b)(1), unless the authorization is terminated or revoked sooner. Performed at [facility], [place] [address], [place], [place] [address]   Culture, blood (routine x 2)     Status: None (Preliminary result)   Collection Time: 09/28/18  9:15 PM   Specimen: BLOOD  Result Value Ref Range Status   Specimen Description BLOOD RIGHT ARM  Final   Special Requests   Final    BOTTLES DRAWN AEROBIC AND ANAEROBIC Blood Culture results may not be optimal due to an inadequate volume of blood received in culture bottles   Culture   Final    NO GROWTH 3 DAYS Performed at [facility], [place] [address], [place], [place] [address]    Report Status PENDING  Incomplete  Culture, blood (routine x 2)     Status: None (Preliminary result)   Collection Time: 09/28/18  9:21 PM   Specimen: BLOOD  Result Value Ref Range Status   Specimen Description BLOOD RIGHT FOREARM  Final   Special Requests   Final     BOTTLES DRAWN AEROBIC AND ANAEROBIC Blood Culture results may not be optimal due to an inadequate volume of blood received in culture bottles   Culture   Final    NO GROWTH 3 DAYS Performed at [facility], [place] [address] [place], [place] [address]    Report Status PENDING  Incomplete  MRSA PCR Screening     Status: None   Collection Time: 09/29/18  1:20 AM   Specimen: Nasal Mucosa; Nasopharyngeal  Result Value Ref Range Status   MRSA by PCR NEGATIVE NEGATIVE Final    Comment:        The GeneXpert MRSA Assay (FDA approved for NASAL specimens only), is one component of a comprehensive MRSA colonization surveillance program. It is not intended to diagnose MRSA infection nor to guide or monitor treatment for MRSA infections. Performed at [facility], [place] [address] [place],  [address]     Sepsis Labs: Invalid input(s): PROCALCITONIN, LACTICIDVEN  Urine analysis:    Component Value Date/Time   COLORURINE YELLOW 09/29/2018 0851   APPEARANCEUR CLOUDY (A) 09/29/2018 0851   LABSPEC 1.015 09/29/2018 0851   PHURINE 9.0 (H) 09/29/2018 8416  [place] NEGATIVE 09/29/2018 [place] 09/29/2018 0851   BILIRUBINUR NEGATIVE 09/29/2018 0851   KETONESUR 20 (A) 09/29/2018 0851   PROTEINUR 30 (A) 09/29/2018 0851   UROBILINOGEN 0.2 05/20/2007 0952   NITRITE NEGATIVE 09/29/2018 0851   LEUKOCYTESUR SMALL (A) 09/29/2018 0851    Anemia Panel: No results for input(s): VITAMINB12, FOLATE, FERRITIN, TIBC, IRON, RETICCTPCT in the last 72 hours.  Thyroid Function Tests: Recent Labs    09/29/18 0210  TSH 0.612    Lipid Profile: No results for input(s): CHOL, HDL, LDLCALC, TRIG, CHOLHDL, LDLDIRECT in the last 72 hours.  CBG: Recent Labs  Lab 09/30/18 1737 09/30/18 1945 10/01/18 0024 10/01/18 0454 10/01/18 0739  GLUCAP 158* 149* 244* 166* 174*    HbA1C: No results for input(s): HGBA1C in the last 72 hours.  BNP (last 3 results): No results for  input(s): PROBNP in the last 8760 hours.  Cardiac Enzymes: No results for input(s): CKTOTAL, CKMB, CKMBINDEX, TROPONINI in the last 168 hours.  Coagulation Profile: No results for input(s): INR, PROTIME in the last 168 hours.  Liver Function Tests: Recent Labs  Lab 09/28/18 1853 09/29/18 0210  AST 17 15  ALT 14 14  ALKPHOS 54 49  BILITOT 1.1 0.9  PROT 7.0 6.9  ALBUMIN 3.2* 3.0*   Recent Labs  Lab 09/28/18 1853  LIPASE 19   No results for input(s): AMMONIA in the last 168 hours.  Basic Metabolic Panel: Recent Labs  Lab 09/28/18 1853 09/29/18 0210 09/30/18 0519 10/01/18 0442  NA 126* 125* 124* 128*  K 4.0 4.0 3.9 4.1  CL 75* 76* 80* 81*  CO2 42* 40* 36* 38*  GLUCOSE 82 117* 149* 118*  BUN 9 8 8 8   CREATININE 0.43* 0.41* 0.37* 0.31*  CALCIUM 9.0 8.7* 8.5* 8.2*  MG 1.7 1.7  --   --   PHOS 2.3* 2.8  --   --    GFR: Estimated Creatinine Clearance: 70.6 mL/min (A) (by C-G formula based on SCr of 0.31 mg/dL (L)).  CBC: Recent Labs  Lab 09/28/18 1853 09/29/18 0210 09/30/18 0519 10/01/18 0442  WBC 23.8* 24.8* 31.4*  32.0* 24.9*  NEUTROABS  --   --  30.1*  --   HGB 11.7* 12.1* 11.6*  11.5* 10.3*  HCT 38.6* 38.8* 36.3*  36.4* 33.8*  MCV 93.5 91.3 91.0  90.8 95.2  PLT 293 254 258  244 183    Procedures:  None  Microbiology summarized: 7/7: COVID-19 negative. 7/7: Blood cultures negative so far. 7/7-MRSA PCR negative 7/7 sputum culture pending.  Assessment & Plan: Aspiration pneumonia: Improved. -Patient with history of significant dysphagia.  -Imaging including portable CXR and CT A/P concerning for bilateral aspiration pneumonia -COVID-19, MRSA PCR and blood cultures negative.  -Vancomycin/Zosyn 7/7-7/8 -Narrowed to Unasyn and azithromycin 7/8--7/10 -Augmentin and azithromycin 7/10-- -Ambulate on 3 to 4 L  Leukocytosis/bandemia: Likely due to #1 above but cannot exclude malignancy although unlikely given acute rise.. -Improving.  Continue  trending.  COPD exacerbation: Has no formal diagnosis but on 3 L at baseline.  Significant smoking history.  Has cardinal symptoms which could also be due to the above.  -Discontinue Brovana.  Continue Pulmicort and PRN DuoNeb.  Start Anoro Ellipta. -Wean oxygen to maintain saturation between 88 and 92%.  Would benefit from low oxygen if he maintains good saturation. -Ambulate on home level.  Dysphagia: Known history of this.  Reportedly likes to continue eating despite aspiration risk.  He is DNI.  Voices understanding  the risk. -Appreciate SLP input -Tube feed per dietitian.  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  Sodium 131 in January 2020.  Improved. -Difficult to implement fluid restriction as he is on feeding tube.   -Continue demeclocycline. -Continue monitoring  Severe protein calorie malnutrition: Patient with dysphagia.  Tube dependent but also takes orally.  BMI 17.23. -Dietitian consulted-appreciate input  Chronic pain/debility -Continue home methadone -PT/OT consult  PVD: -Continue home Plavix and aspirin  DVT prophylaxis: Start subcu Lovenox Code Status: DNI Family Communication: per patient and/or RN.  Available if any question Disposition Plan: Remains inpatient due to hyponatremia, significant leukocytosis and aspiration pneumonia.  Could be released in the next 24 to 48 hours if hyponatremia resolves and leukocytosis improve. Consultants: None  Antimicrobials: Anti-infectives (From admission, onward)   Start     Dose/Rate Route Frequency Ordered Stop   10/01/18 1000  amoxicillin-clavulanate (AUGMENTIN) 875-125 MG per tablet 1 tablet     1 tablet Oral Every 12 hours 10/01/18 0855     09/30/18 1000  azithromycin (ZITHROMAX) tablet 250 mg     250 mg Oral Daily 09/29/18 1413 10/04/18 0959   09/30/18 1000  demeclocycline (DECLOMYCIN) tablet 300 mg     300 mg Oral Every 12 hours 09/30/18 0729     09/29/18 1415  doxycycline (VIBRA-TABS) tablet 100 mg   Status:  Discontinued     100 mg Oral Every 12 hours 09/29/18 1407 09/29/18 1412   09/29/18 1415  azithromycin (ZITHROMAX) tablet 500 mg     500 mg Oral Daily 09/29/18 1413 09/29/18 1749   09/29/18 1300  Ampicillin-Sulbactam (UNASYN) 3 g in sodium chloride 0.9 % 100 mL IVPB  Status:  Discontinued     3 g 200 mL/hr over 30 Minutes Intravenous Every 6 hours 09/29/18 0937 10/01/18 0855   09/29/18 1000  vancomycin (VANCOCIN) IVPB 750 mg/150 ml premix  Status:  Discontinued     750 mg 150 mL/hr over 60 Minutes Intravenous Every 12 hours 09/29/18 0157 09/29/18 0757   09/29/18 0600  piperacillin-tazobactam (ZOSYN) IVPB 3.375 g  Status:  Discontinued     3.375 g 12.5 mL/hr over 240 Minutes Intravenous Every 8 hours 09/29/18 0157 09/29/18 0937   09/28/18 2315  piperacillin-tazobactam (ZOSYN) IVPB 3.375 g     3.375 g 100 mL/hr over 30 Minutes Intravenous  Once 09/28/18 2302 09/29/18 0004   09/28/18 2315  vancomycin (VANCOCIN) IVPB 1000 mg/200 mL premix     1,000 mg 200 mL/hr over 60 Minutes Intravenous  Once 09/28/18 2305 09/29/18 0004      Sch Meds:  Scheduled Meds: . amoxicillin-clavulanate  1 tablet Oral Q12H  . arformoterol  15 mcg Nebulization Q12H  . aspirin  81 mg Oral Daily  . azithromycin  250 mg Oral Daily  . budesonide (PULMICORT) nebulizer solution  0.5 mg Nebulization BID  . clopidogrel  75 mg Oral Daily  . demeclocycline  300 mg Oral Q12H  . feeding supplement (ENSURE ENLIVE)  237 mL Oral BID BM  . mouth rinse  15 mL Mouth Rinse BID  . methadone  115 mg Per Tube Daily  . nicotine  21 mg Transdermal Daily  . senna  1 tablet Oral BID  . sodium chloride flush  3 mL Intravenous Once   Continuous Infusions: . feeding supplement (JEVITY 1.2 CAL) 60 mL/hr at 10/01/18 0844   PRN Meds:.acetaminophen **OR** acetaminophen, bisacodyl, guaiFENesin-dextromethorphan, HYDROcodone-acetaminophen, ipratropium-albuterol, ondansetron **OR** ondansetron (ZOFRAN) IV, polyethylene glycol,  Resource ThickenUp Clear, sodium  chloride   Carlyn Lemke T. [place]  If 7PM-7AM, please contact night-coverage [email or web address] Password [email or web address] 10/01/2018, 1:40 PM
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
## 2018-10-02 ENCOUNTER — Other Ambulatory Visit: Payer: Self-pay | Admitting: Student

## 2018-10-02 LAB — CBC
HCT: 37.8 % — ABNORMAL LOW (ref 39.0–52.0)
Hemoglobin: 11 g/dL — ABNORMAL LOW (ref 13.0–17.0)
MCH: 28.1 pg (ref 26.0–34.0)
MCHC: 29.1 g/dL — ABNORMAL LOW (ref 30.0–36.0)
MCV: 96.4 fL (ref 80.0–100.0)
Platelets: 206 10*3/uL (ref 150–400)
RBC: 3.92 MIL/uL — ABNORMAL LOW (ref 4.22–5.81)
RDW: 13.4 % (ref 11.5–15.5)
WBC: 14.4 10*3/uL — ABNORMAL HIGH (ref 4.0–10.5)
nRBC: 0 % (ref 0.0–0.2)

## 2018-10-02 LAB — BASIC METABOLIC PANEL
Anion gap: 8 (ref 5–15)
BUN: 6 mg/dL (ref 6–20)
CO2: 42 mmol/L — ABNORMAL HIGH (ref 22–32)
Calcium: 8.9 mg/dL (ref 8.9–10.3)
Chloride: 81 mmol/L — ABNORMAL LOW (ref 98–111)
Creatinine, Ser: 0.39 mg/dL — ABNORMAL LOW (ref 0.61–1.24)
GFR calc Af Amer: >60 mL/min (ref >60)
GFR calc non Af Amer: >60 mL/min (ref >60)
Glucose, Bld: 81 mg/dL (ref 70–99)
Potassium: 4.7 mmol/L (ref 3.5–5.1)
Sodium: 131 mmol/L — ABNORMAL LOW (ref 135–145)

## 2018-10-02 LAB — MAGNESIUM: Magnesium: 1.6 mg/dL — ABNORMAL LOW (ref 1.7–2.4)

## 2018-10-02 LAB — GLUCOSE, CAPILLARY
Glucose-Capillary: 206 mg/dL — ABNORMAL HIGH (ref 70–99)
Glucose-Capillary: 130 mg/dL — ABNORMAL HIGH (ref 70–99)

## 2018-10-02 MED ORDER — ALBUTEROL SULFATE HFA 108 (90 BASE) MCG/ACT IN AERS: 2.0000 | INHALATION_SPRAY | Freq: Four times a day (QID) | RESPIRATORY_TRACT | 0 refills | Status: AC | PRN

## 2018-10-02 NOTE — Progress Notes (Signed)
Noted that albuterol was not ordered on discharge. Sent Rx to his [employer] and notified patient.

## 2018-10-02 NOTE — Progress Notes (Addendum)
Pt refusing to go back on PEG tube feeding. Multiple attempts to get this nurse to give him ice cream and thin liquids. Pt states "I can swallow now" This nurse instructs pt on aspiration

## 2018-10-02 NOTE — Progress Notes (Signed)
Pt intentionally removes IV.

## 2018-10-02 NOTE — Discharge Summary (Signed)
Physician Discharge Summary  Tony Berry QHU:[id number] DOB: 02/02/1961 DOA: 09/28/2018  PCP: Cher Nakai, MD  Admit date: 09/28/2018 Discharge date: 10/02/2018  Admitted From: Home Disposition: Home  Recommendations for Outpatient Follow-up:  1. Follow up with PCP in 1-2 weeks 2. Please obtain CBC/BMP/Mag at follow up 3. Please follow up on the following pending results: None  Home Health: None Equipment/Devices: None  Discharge Condition: Stable CODE STATUS: Full code  Hospital Course: 58 year old male with history of head and neck cancer status post radiation, dysphagia, severe protein calorie malnutrition, chronic respiratory failure on 3 L at baseline, HTN, hyponatremia and PVD presenting with abdominal pain and concern about feeding tube dislodging after mechanical fall at home 3 days ago.  Imaging in ED concerning for bilateral aspiration pneumonia.  COVID-19 negative.  Admitted on vancomycin and Zosyn, transitioned to Unasyn and azithromycin, then to Augmentin and azithromycin with improvement in his respiratory status, leukocytosis and overall clinical condition.  Patient was ambulated on home oxygen and maintain good saturation.  See individual problem list below for more.  Discharge Diagnoses:  Aspiration pneumonia: Improved. -Patient with history of significant dysphagia.   Not compliant with SLP recommendations. -Imaging including portable CXR and CT A/P concerning for bilateral aspiration pneumonia -COVID-19, MRSA PCR and blood cultures negative.  -Vancomycin/Zosyn 7/7-7/8 -Narrowed to Unasyn and azithromycin 7/8--7/10 -Augmentin and azithromycin 7/10--7/13  Leukocytosis/bandemia: Likely due to #1 above but cannot exclude malignancy although unlikely given acute rise.  Improved significantly.  Recheck CBC at follow-up.  COPD exacerbation: Has no formal diagnosis but on 3 L at baseline.  Significant smoking history.  Has cardinal symptoms which could also be  due to the above.  -Discharged on antibiotics as above, Stiolto and albuterol inhaler  Dysphagia: Known history of this.    Not compliant with SLP recommendations.  Reportedly likes to continue eating despite aspiration risk. Voices understanding the risk. -Appreciate SLP input -Tube feed per dietitian.  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  Sodium 131 (the same in January 2020).  -Discharged on sodium chloride tablets  Severe protein calorie malnutrition: Patient with dysphagia.  Tube dependent but also takes orally.  BMI 17.23. -Dietitian consulted-appreciate input  Chronic pain/debility -Continue home methadone  PVD: -Continue home Plavix and aspirin   Discharge Instructions  Discharge Instructions    Call MD for:  difficulty breathing, headache or visual disturbances   Complete by: As directed    Call MD for:  extreme fatigue   Complete by: As directed    Call MD for:  persistant nausea and vomiting   Complete by: As directed    Call MD for:  temperature >100.4   Complete by: As directed    Diet general   Complete by: As directed    Discharge instructions   Complete by: As directed    It has been a pleasure taking care of you! You were admitted with shortness of breath which is likely due to pneumonia from aspiration and COPD exacerbation. You were treated with antibiotics and breathing treatments.  With that your symptoms improved to the point we think it is safe to let you go home and follow-up with your primary care doctor.  We are discharging you on more medications to continue taking to complete the course of treatment. Please review your new medication list and the directions before you take your medications. Please follow-up with your primary care doctor in 1 to 2 weeks.  Take care,   Increase activity  slowly   Complete by: As directed      Allergies as of 10/02/2018   No Known Allergies     Medication List    STOP taking these  medications   formoterol 20 MCG/2ML nebulizer solution Commonly known as: PERFOROMIST   predniSONE 10 MG tablet Commonly known as: DELTASONE     TAKE these medications   amoxicillin-clavulanate 875-125 MG tablet Commonly known as: AUGMENTIN Take 1 tablet by mouth 2 (two) times daily.   aspirin 81 MG chewable tablet Chew 1 tablet (81 mg total) by mouth daily. Please do not take for five days before your gastrostomy tube placement procedure What changed: additional instructions   azithromycin 250 MG tablet Commonly known as: ZITHROMAX Take 1 tablet (250 mg total) by mouth daily for 2 days. What changed:   medication strength  how much to take   clopidogrel 75 MG tablet Commonly known as: PLAVIX Take 1 tablet (75 mg total) by mouth daily. Please do not take for the 5 days before your gastrostomy tube procedure   feeding supplement (JEVITY 1.2 CAL) Liqd Place 1,000 mLs into feeding tube continuous. Six a day   fluticasone 50 MCG/ACT nasal spray Commonly known as: FLONASE Place 1 spray into both nostrils daily.   free water Soln Place 200 mLs into feeding tube 3 (three) times daily after meals.   furosemide 20 MG tablet Commonly known as: LASIX Take 1 tablet (20 mg total) by mouth 2 (two) times daily. What changed: how much to take   METHADONE HCL PO Take 115 mg by mouth daily.   nicotine 21 mg/24hr patch Commonly known as: NICODERM CQ - dosed in mg/24 hours Place 21 mg onto the skin daily.   polyethylene glycol 17 g packet Commonly known as: MIRALAX / GLYCOLAX Take 17 g by mouth daily.   Resource ThickenUp Clear Powd Take 120 g by mouth as needed.   senna 8.6 MG Tabs tablet Commonly known as: SENOKOT Take 1 tablet by mouth daily.   sodium chloride 1 g tablet Take 1 tablet (1 g total) by mouth 2 (two) times daily with a meal.   Stiolto Respimat 2.5-2.5 MCG/ACT Aers Generic drug: Tiotropium Bromide-Olodaterol Inhale 2 puffs into the lungs daily.       Follow-up Information    Cher Nakai, MD Follow up.   Specialty: Internal Medicine Why: Appointment: October 07, 2018 @ 2:30pm Contact information: [place] [address] [phone]           Consultations:  None  Procedures/Studies:  2D Echo: None  Dg Chest 2 View  Result Date: 09/10/2018 CLINICAL DATA:  Aspiration pneumonia. EXAM: CHEST - 2 VIEW COMPARISON:  Chest/left rib radiographs 08/02/2018 FINDINGS: The cardiomediastinal silhouette is within normal limits. There is emphysema with prominent hyperinflation of the lungs. Patchy opacities in the left mid lung and left lung base on the prior study have further decreased. There is mild residual coarse opacity in these areas which may in part reflect developing scarring. Right lung markings are mildly coarse in the perihilar and basilar regions without focal consolidation. No pleural effusion or pneumothorax is identified. An old distal left clavicle fracture is noted. IMPRESSION: Further improvement of left lung pneumonia with mild residual opacity remaining. Emphysema. Electronically Signed   By: Logan Bores M.D.   On: 09/10/2018 15:29   Ct Abdomen Pelvis W Contrast  Result Date: 09/28/2018 CLINICAL DATA:  Acute abdominal pain with fever. EXAM: CT ABDOMEN AND PELVIS  WITH CONTRAST TECHNIQUE: Multidetector CT imaging of the abdomen and pelvis was performed using the standard protocol following bolus administration of intravenous contrast. CONTRAST:  161mL OMNIPAQUE IOHEXOL 300 MG/ML  SOLN COMPARISON:  CT dated June 05, 2018. FINDINGS: Lower chest: There are new airspace opacities throughout the right middle lobe and bilateral lower lobes. There is mucous plugging and bronchial wall thickening. The lungs appear to be at least somewhat hyperexpanded. The heart size is normal. Hepatobiliary: The liver is normal. Normal gallbladder.There is no biliary ductal dilation. Pancreas: Normal contours without ductal  dilatation. No peripancreatic fluid collection. Spleen: No splenic laceration or hematoma. Adrenals/Urinary Tract: --Adrenal glands: No adrenal hemorrhage. --Right kidney/ureter: No hydronephrosis or perinephric hematoma. --Left kidney/ureter: No hydronephrosis or perinephric hematoma. --Urinary bladder: Unremarkable. Stomach/Bowel: --Stomach/Duodenum: There is a well-positioned PEG tube. --Small bowel: No dilatation or inflammation. --Colon: There is a moderate amount of stool in the colon. --Appendix: Normal. Vascular/Lymphatic: Atherosclerotic calcification is present within the non-aneurysmal abdominal aorta, without hemodynamically significant stenosis. --No retroperitoneal lymphadenopathy. --No mesenteric lymphadenopathy. --No pelvic or inguinal lymphadenopathy. Reproductive: Unremarkable Other: No ascites or free air. The abdominal wall is normal. Musculoskeletal. The patient is status post prior posterior fusion of the lumbar spine. There are advanced degenerative changes of the right hip. There is no displaced fracture. No dislocation. IMPRESSION: 1. New airspace opacities in the right middle lobe and bilateral lower lobes concerning for pneumonia or aspiration. 2. Evaluation of the abdomen is limited by lack of intra-abdominal fat. Given this limitation, there is no definite acute intra-abdominal abnormality detected. 3. The gastrostomy tube is well positioned. Electronically Signed   By: Constance Holster M.D.   On: 09/28/2018 21:59   Dg Chest Portable 1 View  Result Date: 09/28/2018 CLINICAL DATA:  Shortness of breath EXAM: PORTABLE CHEST 1 VIEW COMPARISON:  September 25, 2018 FINDINGS: The heart size is stable. The lungs are hyperexpanded. There is no pneumothorax. No large pleural effusion. There are bibasilar airspace opacities. The airspace opacities at the right lung base appear to have progressed since the prior study. The left lung base airspace opacity appears improved. IMPRESSION: 1. Worsening  right lower lobe airspace opacity concerning for pneumonia or aspiration. 2. Improving left lower lobe airspace opacity. 3. Hyperexpanded lungs, similar to prior study. Electronically Signed   By: Constance Holster M.D.   On: 09/28/2018 21:29     Subjective: No major events overnight of this morning.  No complaints.  Denies dyspnea, chest pain, GI or GU symptoms.  Feels ready to go home.   Discharge Exam: Vitals:   10/02/18 0726 10/02/18 0801  BP:  (!) 143/66  Pulse:  72  Resp:  18  Temp:  97.8 F (36.6 C)  SpO2: 96% 99%    GENERAL: No acute distress.  Appears chronically ill. HEENT: MMM.  Vision and hearing grossly intact.  NECK: Supple.  No apparent JVD. LUNGS:  No IWOB. Good air movement bilaterally. HEART:  RRR. Heart sounds normal.  ABD: Bowel sounds present. Soft. Non tender.  Feeding tube over LUQ. MSK/EXT:  Moves all extremities. No apparent deformity.  1+ edema bilaterally.  Significant muscle mass wasting. SKIN: no apparent skin lesion or wound NEURO: Awake, alert and oriented appropriately.  No gross deficit.  PSYCH: Calm. Normal affect.     The results of significant diagnostics from this hospitalization (including imaging, microbiology, ancillary and laboratory) are listed below for reference.     Microbiology: Recent Results (from the past 240 hour(s))  SARS Coronavirus 2 (  CEPHEID - Performed in [place] hospital lab), Hosp Order     Status: None   Collection Time: 09/28/18  9:00 PM   Specimen: Nasopharyngeal Swab  Result Value Ref Range Status   SARS Coronavirus 2 NEGATIVE NEGATIVE Final    Comment: (NOTE) If result is NEGATIVE SARS-CoV-2 target nucleic acids are NOT DETECTED. The SARS-CoV-2 RNA is generally detectable in upper and lower  respiratory specimens during the acute phase of infection. The lowest  concentration of SARS-CoV-2 viral copies this assay can detect is 250  copies / mL. A negative result does not preclude SARS-CoV-2 infection   and should not be used as the sole basis for treatment or other  patient management decisions.  A negative result may occur with  improper specimen collection / handling, submission of specimen other  than nasopharyngeal swab, presence of viral mutation(s) within the  areas targeted by this assay, and inadequate number of viral copies  (<250 copies / mL). A negative result must be combined with clinical  observations, patient history, and epidemiological information. If result is POSITIVE SARS-CoV-2 target nucleic acids are DETECTED. The SARS-CoV-2 RNA is generally detectable in upper and lower  respiratory specimens dur ing the acute phase of infection.  Positive  results are indicative of active infection with SARS-CoV-2.  Clinical  correlation with patient history and other diagnostic information is  necessary to determine patient infection status.  Positive results do  not rule out bacterial infection or co-infection with other viruses. If result is PRESUMPTIVE POSTIVE SARS-CoV-2 nucleic acids MAY BE PRESENT.   A presumptive positive result was obtained on the submitted specimen  and confirmed on repeat testing.  While 2019 novel coronavirus  (SARS-CoV-2) nucleic acids may be present in the submitted sample  additional confirmatory testing may be necessary for epidemiological  and / or clinical management purposes  to differentiate between  SARS-CoV-2 and other Sarbecovirus currently known to infect humans.  If clinically indicated additional testing with an alternate test  methodology [phone]) is advised. The SARS-CoV-2 RNA is generally  detectable in upper and lower respiratory sp ecimens during the acute  phase of infection. The expected result is Negative. Fact Sheet for Patients:  StrictlyIdeas.no Fact Sheet for Healthcare Providers: BankingDealers.co.za This test is not yet approved or cleared by the [place] FDA and has  been authorized for detection and/or diagnosis of SARS-CoV-2 by FDA under an Emergency Use Authorization (EUA).  This EUA will remain in effect (meaning this test can be used) for the duration of the COVID-19 declaration under Section 564(b)(1) of the Act, 21 U.S.C. section 360bbb-3(b)(1), unless the authorization is terminated or revoked sooner. Performed at [facility], [place] [address] [place], [place] [address]   Culture, blood (routine x 2)     Status: None (Preliminary result)   Collection Time: 09/28/18  9:15 PM   Specimen: BLOOD  Result Value Ref Range Status   Specimen Description BLOOD RIGHT ARM  Final   Special Requests   Final    BOTTLES DRAWN AEROBIC AND ANAEROBIC Blood Culture results may not be optimal due to an inadequate volume of blood received in culture bottles   Culture   Final    NO GROWTH 3 DAYS Performed at [facility], [place] [address] [place], [place] [address]    Report Status PENDING  Incomplete  Culture, blood (routine x 2)     Status: None (Preliminary result)   Collection Time: 09/28/18  9:21 PM  Specimen: BLOOD  Result Value Ref Range Status   Specimen Description BLOOD RIGHT FOREARM  Final   Special Requests   Final    BOTTLES DRAWN AEROBIC AND ANAEROBIC Blood Culture results may not be optimal due to an inadequate volume of blood received in culture bottles   Culture   Final    NO GROWTH 3 DAYS Performed at [facility], [place] [address] [place], [place] [address]    Report Status PENDING  Incomplete  MRSA PCR Screening     Status: None   Collection Time: 09/29/18  1:20 AM   Specimen: Nasal Mucosa; Nasopharyngeal  Result Value Ref Range Status   MRSA by PCR NEGATIVE NEGATIVE Final    Comment:        The GeneXpert MRSA Assay (FDA approved for NASAL specimens only), is one component of a comprehensive MRSA colonization surveillance program. It is not intended to diagnose MRSA infection nor to guide or monitor treatment  for MRSA infections. Performed at [facility], [place] [address] [place], [place] [address]      Labs: BNP (last 3 results) Recent Labs    03/10/18 1442  BNP 588.5*   Basic Metabolic Panel: Recent Labs  Lab 09/28/18 1853 09/29/18 0210 09/30/18 0519 10/01/18 0442 10/02/18 0241  NA 126* 125* 124* 128* 131*  K 4.0 4.0 3.9 4.1 4.7  CL 75* 76* 80* 81* 81*  CO2 42* 40* 36* 38* 42*  GLUCOSE 82 117* 149* 118* 81  BUN 9 8 8 8 6   CREATININE 0.43* 0.41* 0.37* 0.31* 0.39*  CALCIUM 9.0 8.7* 8.5* 8.2* 8.9  MG 1.7 1.7  --   --  1.6*  PHOS 2.3* 2.8  --   --   --    Liver Function Tests: Recent Labs  Lab 09/28/18 1853 09/29/18 0210  AST 17 15  ALT 14 14  ALKPHOS 54 49  BILITOT 1.1 0.9  PROT 7.0 6.9  ALBUMIN 3.2* 3.0*   Recent Labs  Lab 09/28/18 1853  LIPASE 19   No results for input(s): AMMONIA in the last 168 hours. CBC: Recent Labs  Lab 09/28/18 1853 09/29/18 0210 09/30/18 0519 10/01/18 0442 10/02/18 0241  WBC 23.8* 24.8* 31.4*  32.0* 24.9* 14.4*  NEUTROABS  --   --  30.1*  --   --   HGB 11.7* 12.1* 11.6*  11.5* 10.3* 11.0*  HCT 38.6* 38.8* 36.3*  36.4* 33.8* 37.8*  MCV 93.5 91.3 91.0  90.8 95.2 96.4  PLT 293 254 258  244 183 206   Cardiac Enzymes: No results for input(s): CKTOTAL, CKMB, CKMBINDEX, TROPONINI in the last 168 hours. BNP: Invalid input(s): POCBNP CBG: Recent Labs  Lab 10/01/18 1656 10/01/18 2020 10/01/18 2314 10/02/18 0351 10/02/18 0733  GLUCAP 254* 241* 104* 206* 130*   D-Dimer No results for input(s): DDIMER in the last 72 hours. Hgb A1c No results for input(s): HGBA1C in the last 72 hours. Lipid Profile No results for input(s): CHOL, HDL, LDLCALC, TRIG, CHOLHDL, LDLDIRECT in the last 72 hours. Thyroid function studies No results for input(s): TSH, T4TOTAL, T3FREE, THYROIDAB in the last 72 hours.  Invalid input(s): FREET3 Anemia work up No results for input(s): VITAMINB12, FOLATE, FERRITIN, TIBC, IRON, RETICCTPCT in  the last 72 hours. Urinalysis    Component Value Date/Time   COLORURINE YELLOW 09/29/2018 0851   APPEARANCEUR CLOUDY (A) 09/29/2018 0851   LABSPEC 1.015 09/29/2018 0851   PHURINE 9.0 (H) 09/29/2018 0851   GLUCOSEU NEGATIVE 09/29/2018  [place] 09/29/2018 0851   BILIRUBINUR NEGATIVE 09/29/2018 0851   KETONESUR 20 (A) 09/29/2018 0851   PROTEINUR 30 (A) 09/29/2018 0851   UROBILINOGEN 0.2 05/20/2007 0952   NITRITE NEGATIVE 09/29/2018 0851   LEUKOCYTESUR SMALL (A) 09/29/2018 0851   Sepsis Labs Invalid input(s): PROCALCITONIN,  WBC,  LACTICIDVEN   Time coordinating discharge: 35 minutes  SIGNED:  Mercy Riding, MD  [facility] 10/02/2018, 3:05 PM  If 7PM-7AM, please contact night-coverage [email or web address] Password [email or web address]

## 2018-10-02 NOTE — Plan of Care (Signed)
Patient adequately met goals for progression to discharge. Discharged home for self care. Brother providing transportation.

## 2018-10-03 LAB — CULTURE, BLOOD (ROUTINE X 2)
Culture: NO GROWTH
Culture: NO GROWTH

## 2018-10-20 ENCOUNTER — Telehealth: Payer: Self-pay | Admitting: Pulmonary Disease

## 2018-10-21 NOTE — Telephone Encounter (Signed)
L/m on pt vm to call back to schedule PFT -pr

## 2018-10-22 NOTE — Telephone Encounter (Signed)
Tony Berry is working on scheduling

## 2018-10-25 NOTE — Telephone Encounter (Signed)
Called and scheduled patient for PFT on 11/17/2018-pr

## 2018-10-26 ENCOUNTER — Inpatient Hospital Stay (HOSPITAL_COMMUNITY)
Admission: AD | Admit: 2018-10-26 | Discharge: 2018-10-31 | DRG: 871 | Disposition: A | Discharge status: Home / Self Care | Payer: Medicaid Other | Source: Other Acute Inpatient Hospital | Attending: Internal Medicine | Admitting: Internal Medicine

## 2018-10-26 ENCOUNTER — Inpatient Hospital Stay (HOSPITAL_COMMUNITY): Payer: Medicaid Other

## 2018-10-26 DIAGNOSIS — I739 Peripheral vascular disease, unspecified: Secondary | ICD-10-CM | POA: Diagnosis not present

## 2018-10-26 DIAGNOSIS — Z9289 Personal history of other medical treatment: Secondary | ICD-10-CM | POA: Diagnosis not present

## 2018-10-26 DIAGNOSIS — Z681 Body mass index (BMI) 19 or less, adult: Secondary | ICD-10-CM | POA: Diagnosis not present

## 2018-10-26 DIAGNOSIS — Z931 Gastrostomy status: Secondary | ICD-10-CM | POA: Diagnosis not present

## 2018-10-26 DIAGNOSIS — E876 Hypokalemia: Secondary | ICD-10-CM | POA: Diagnosis present

## 2018-10-26 DIAGNOSIS — I2510 Atherosclerotic heart disease of native coronary artery without angina pectoris: Secondary | ICD-10-CM | POA: Diagnosis present

## 2018-10-26 DIAGNOSIS — Z9111 Patient's noncompliance with dietary regimen: Secondary | ICD-10-CM

## 2018-10-26 DIAGNOSIS — Z87891 Personal history of nicotine dependence: Secondary | ICD-10-CM

## 2018-10-26 DIAGNOSIS — Z801 Family history of malignant neoplasm of trachea, bronchus and lung: Secondary | ICD-10-CM

## 2018-10-26 DIAGNOSIS — Z515 Encounter for palliative care: Secondary | ICD-10-CM | POA: Diagnosis not present

## 2018-10-26 DIAGNOSIS — Z8701 Personal history of pneumonia (recurrent): Secondary | ICD-10-CM

## 2018-10-26 DIAGNOSIS — G92 Toxic encephalopathy: Secondary | ICD-10-CM | POA: Diagnosis not present

## 2018-10-26 DIAGNOSIS — T17908S Unspecified foreign body in respiratory tract, part unspecified causing other injury, sequela: Secondary | ICD-10-CM | POA: Diagnosis not present

## 2018-10-26 DIAGNOSIS — R6521 Severe sepsis with septic shock: Secondary | ICD-10-CM

## 2018-10-26 DIAGNOSIS — G8929 Other chronic pain: Secondary | ICD-10-CM | POA: Diagnosis not present

## 2018-10-26 DIAGNOSIS — C159 Malignant neoplasm of esophagus, unspecified: Secondary | ICD-10-CM | POA: Diagnosis not present

## 2018-10-26 DIAGNOSIS — Z9981 Dependence on supplemental oxygen: Secondary | ICD-10-CM | POA: Diagnosis not present

## 2018-10-26 DIAGNOSIS — Z7189 Other specified counseling: Secondary | ICD-10-CM | POA: Diagnosis not present

## 2018-10-26 DIAGNOSIS — Z923 Personal history of irradiation: Secondary | ICD-10-CM

## 2018-10-26 DIAGNOSIS — Z79899 Other long term (current) drug therapy: Secondary | ICD-10-CM

## 2018-10-26 DIAGNOSIS — L8990 Pressure ulcer of unspecified site, unspecified stage: Secondary | ICD-10-CM | POA: Insufficient documentation

## 2018-10-26 DIAGNOSIS — T17908A Unspecified foreign body in respiratory tract, part unspecified causing other injury, initial encounter: Secondary | ICD-10-CM | POA: Diagnosis not present

## 2018-10-26 DIAGNOSIS — Z825 Family history of asthma and other chronic lower respiratory diseases: Secondary | ICD-10-CM

## 2018-10-26 DIAGNOSIS — E871 Hypo-osmolality and hyponatremia: Secondary | ICD-10-CM | POA: Diagnosis present

## 2018-10-26 DIAGNOSIS — E43 Unspecified severe protein-calorie malnutrition: Secondary | ICD-10-CM | POA: Diagnosis not present

## 2018-10-26 DIAGNOSIS — L89150 Pressure ulcer of sacral region, unstageable: Secondary | ICD-10-CM | POA: Diagnosis not present

## 2018-10-26 DIAGNOSIS — Z79891 Long term (current) use of opiate analgesic: Secondary | ICD-10-CM

## 2018-10-26 DIAGNOSIS — J690 Pneumonitis due to inhalation of food and vomit: Secondary | ICD-10-CM | POA: Diagnosis not present

## 2018-10-26 DIAGNOSIS — R64 Cachexia: Secondary | ICD-10-CM | POA: Diagnosis not present

## 2018-10-26 DIAGNOSIS — R1310 Dysphagia, unspecified: Secondary | ICD-10-CM

## 2018-10-26 DIAGNOSIS — I248 Other forms of acute ischemic heart disease: Secondary | ICD-10-CM | POA: Diagnosis present

## 2018-10-26 DIAGNOSIS — J9621 Acute and chronic respiratory failure with hypoxia: Secondary | ICD-10-CM | POA: Diagnosis present

## 2018-10-26 DIAGNOSIS — Z8589 Personal history of malignant neoplasm of other organs and systems: Secondary | ICD-10-CM

## 2018-10-26 DIAGNOSIS — J441 Chronic obstructive pulmonary disease with (acute) exacerbation: Secondary | ICD-10-CM | POA: Diagnosis not present

## 2018-10-26 DIAGNOSIS — Z7902 Long term (current) use of antithrombotics/antiplatelets: Secondary | ICD-10-CM

## 2018-10-26 DIAGNOSIS — Z0189 Encounter for other specified special examinations: Secondary | ICD-10-CM | POA: Diagnosis not present

## 2018-10-26 DIAGNOSIS — A419 Sepsis, unspecified organism: Secondary | ICD-10-CM | POA: Diagnosis present

## 2018-10-26 DIAGNOSIS — J9622 Acute and chronic respiratory failure with hypercapnia: Secondary | ICD-10-CM | POA: Diagnosis present

## 2018-10-26 DIAGNOSIS — I10 Essential (primary) hypertension: Secondary | ICD-10-CM | POA: Diagnosis not present

## 2018-10-26 DIAGNOSIS — Z7951 Long term (current) use of inhaled steroids: Secondary | ICD-10-CM

## 2018-10-26 DIAGNOSIS — Z9119 Patient's noncompliance with other medical treatment and regimen: Secondary | ICD-10-CM

## 2018-10-26 DIAGNOSIS — J9600 Acute respiratory failure, unspecified whether with hypoxia or hypercapnia: Secondary | ICD-10-CM | POA: Diagnosis present

## 2018-10-26 DIAGNOSIS — Z7982 Long term (current) use of aspirin: Secondary | ICD-10-CM

## 2018-10-26 LAB — CBC
HCT: 43.3 % (ref 39.0–52.0)
Hemoglobin: 12.7 g/dL — ABNORMAL LOW (ref 13.0–17.0)
MCH: 28.5 pg (ref 26.0–34.0)
MCHC: 29.3 g/dL — ABNORMAL LOW (ref 30.0–36.0)
MCV: 97.1 fL (ref 80.0–100.0)
Platelets: 226 10*3/uL (ref 150–400)
RBC: 4.46 MIL/uL (ref 4.22–5.81)
RDW: 12.3 % (ref 11.5–15.5)
WBC: 15.8 10*3/uL — ABNORMAL HIGH (ref 4.0–10.5)
nRBC: 0 % (ref 0.0–0.2)

## 2018-10-26 LAB — HEPATIC FUNCTION PANEL
ALT: 24 U/L (ref 0–44)
AST: 58 U/L — ABNORMAL HIGH (ref 15–41)
Albumin: 2.2 g/dL — ABNORMAL LOW (ref 3.5–5.0)
Alkaline Phosphatase: 82 U/L (ref 38–126)
Bilirubin, Direct: 0.2 mg/dL (ref 0.0–0.2)
Indirect Bilirubin: 0.3 mg/dL (ref 0.3–0.9)
Total Bilirubin: 0.5 mg/dL (ref 0.3–1.2)
Total Protein: 6 g/dL — ABNORMAL LOW (ref 6.5–8.1)

## 2018-10-26 LAB — POCT I-STAT 7, (LYTES, BLD GAS, ICA,H+H)
Acid-Base Excess: 16 mmol/L — ABNORMAL HIGH (ref 0.0–2.0)
Bicarbonate: 40.6 mmol/L — ABNORMAL HIGH (ref 20.0–28.0)
Calcium, Ion: 1.15 mmol/L (ref 1.15–1.40)
HCT: 37 % — ABNORMAL LOW (ref 39.0–52.0)
Hemoglobin: 12.6 g/dL — ABNORMAL LOW (ref 13.0–17.0)
O2 Saturation: 83 %
Patient temperature: 101.1
Potassium: 3.8 mmol/L (ref 3.5–5.1)
Sodium: 136 mmol/L (ref 135–145)
TCO2: 42 mmol/L — ABNORMAL HIGH (ref 22–32)
pCO2 arterial: 53 mmHg — ABNORMAL HIGH (ref 32.0–48.0)
pH, Arterial: 7.497 — ABNORMAL HIGH (ref 7.350–7.450)
pO2, Arterial: 49 mmHg — ABNORMAL LOW (ref 83.0–108.0)

## 2018-10-26 LAB — GLUCOSE, CAPILLARY
Glucose-Capillary: 124 mg/dL — ABNORMAL HIGH (ref 70–99)
Glucose-Capillary: 76 mg/dL (ref 70–99)

## 2018-10-26 LAB — RENAL FUNCTION PANEL
Albumin: 2.2 g/dL — ABNORMAL LOW (ref 3.5–5.0)
Anion gap: 9 (ref 5–15)
BUN: 18 mg/dL (ref 6–20)
CO2: 37 mmol/L — ABNORMAL HIGH (ref 22–32)
Calcium: 8.6 mg/dL — ABNORMAL LOW (ref 8.9–10.3)
Chloride: 89 mmol/L — ABNORMAL LOW (ref 98–111)
Creatinine, Ser: 0.69 mg/dL (ref 0.61–1.24)
GFR calc Af Amer: >60 mL/min (ref >60)
GFR calc non Af Amer: >60 mL/min (ref >60)
Glucose, Bld: 158 mg/dL — ABNORMAL HIGH (ref 70–99)
Phosphorus: 1.1 mg/dL — ABNORMAL LOW (ref 2.5–4.6)
Potassium: 4.7 mmol/L (ref 3.5–5.1)
Sodium: 135 mmol/L (ref 135–145)

## 2018-10-26 LAB — LACTIC ACID, PLASMA
Lactic Acid, Venous: 3.7 mmol/L (ref 0.5–1.9)
Lactic Acid, Venous: 3.8 mmol/L (ref 0.5–1.9)

## 2018-10-26 LAB — MAGNESIUM: Magnesium: 1.4 mg/dL — ABNORMAL LOW (ref 1.7–2.4)

## 2018-10-26 LAB — TROPONIN I (HIGH SENSITIVITY): Troponin I (High Sensitivity): 132 ng/L (ref <18)

## 2018-10-26 MED ORDER — SODIUM CHLORIDE 1 G PO TABS
1.0000 g | ORAL_TABLET | Freq: Two times a day (BID) | ORAL | Status: DC
  Administered 2018-10-27 – 2018-10-28 (×3): 1 g via ORAL
  Filled 2018-10-26 (×3): qty 1

## 2018-10-26 MED ORDER — HEPARIN SODIUM (PORCINE) 5000 UNIT/ML IJ SOLN
5000.0000 [IU] | Freq: Three times a day (TID) | INTRAMUSCULAR | Status: DC
  Administered 2018-10-26 – 2018-10-29 (×7): 5000 [IU] via SUBCUTANEOUS
  Filled 2018-10-26 (×7): qty 1

## 2018-10-26 MED ORDER — IPRATROPIUM-ALBUTEROL 0.5-2.5 (3) MG/3ML IN SOLN
3.0000 mL | Freq: Four times a day (QID) | RESPIRATORY_TRACT | Status: DC
  Administered 2018-10-26 – 2018-10-29 (×13): 3 mL via RESPIRATORY_TRACT
  Filled 2018-10-26 (×13): qty 3

## 2018-10-26 MED ORDER — ONDANSETRON HCL 4 MG/2ML IJ SOLN
4.0000 mg | Freq: Four times a day (QID) | INTRAMUSCULAR | Status: DC | PRN
  Administered 2018-10-30: 4 mg via INTRAVENOUS
  Filled 2018-10-26: qty 2

## 2018-10-26 MED ORDER — NOREPINEPHRINE 4 MG/250ML-% IV SOLN
0.0000 ug/min | INTRAVENOUS | Status: DC
  Administered 2018-10-26: 13 ug/min via INTRAVENOUS
  Filled 2018-10-26: qty 250

## 2018-10-26 MED ORDER — ORAL CARE MOUTH RINSE
15.0000 mL | OROMUCOSAL | Status: DC
  Administered 2018-10-26 – 2018-10-27 (×9): 15 mL via OROMUCOSAL

## 2018-10-26 MED ORDER — CLOPIDOGREL BISULFATE 75 MG PO TABS
75.0000 mg | ORAL_TABLET | Freq: Every day | ORAL | Status: DC
  Administered 2018-10-27 – 2018-10-28 (×2): 75 mg via ORAL
  Filled 2018-10-26 (×2): qty 1

## 2018-10-26 MED ORDER — PIPERACILLIN-TAZOBACTAM 3.375 G IVPB 30 MIN
3.3750 g | Freq: Once | INTRAVENOUS | Status: AC
  Administered 2018-10-26: 22:00:00 3.375 g via INTRAVENOUS
  Filled 2018-10-26: qty 50

## 2018-10-26 MED ORDER — FENTANYL 2500MCG IN NS 250ML (10MCG/ML) PREMIX INFUSION
0.0000 ug/h | INTRAVENOUS | Status: DC
  Administered 2018-10-26: 300 ug/h via INTRAVENOUS
  Filled 2018-10-26: qty 250

## 2018-10-26 MED ORDER — LACTATED RINGERS IV BOLUS
1000.0000 mL | Freq: Once | INTRAVENOUS | Status: AC
  Administered 2018-10-27: 1000 mL via INTRAVENOUS

## 2018-10-26 MED ORDER — FAMOTIDINE IN NACL 20-0.9 MG/50ML-% IV SOLN
20.0000 mg | Freq: Two times a day (BID) | INTRAVENOUS | Status: DC
  Administered 2018-10-26 – 2018-10-30 (×8): 20 mg via INTRAVENOUS
  Filled 2018-10-26 (×11): qty 50

## 2018-10-26 MED ORDER — POTASSIUM PHOSPHATES 15 MMOLE/5ML IV SOLN
20.0000 meq | Freq: Once | INTRAVENOUS | Status: AC
  Administered 2018-10-26: 20 meq via INTRAVENOUS
  Filled 2018-10-26: qty 4.55

## 2018-10-26 MED ORDER — LACTATED RINGERS IV SOLN
INTRAVENOUS | Status: DC
  Administered 2018-10-26 – 2018-10-27 (×2): via INTRAVENOUS

## 2018-10-26 MED ORDER — MIDAZOLAM 50MG/50ML (1MG/ML) PREMIX INFUSION
1.0000 mg/h | INTRAVENOUS | Status: DC
  Administered 2018-10-26: 3 mg/h via INTRAVENOUS
  Filled 2018-10-26: qty 50

## 2018-10-26 MED ORDER — POLYETHYLENE GLYCOL 3350 17 G PO PACK
17.0000 g | PACK | Freq: Every day | ORAL | Status: DC
  Administered 2018-10-27 – 2018-10-28 (×2): 17 g via ORAL
  Filled 2018-10-26 (×2): qty 1

## 2018-10-26 MED ORDER — SODIUM CHLORIDE 0.9 % IV BOLUS
2000.0000 mL | Freq: Once | INTRAVENOUS | Status: AC
  Administered 2018-10-26: 2000 mL via INTRAVENOUS

## 2018-10-26 MED ORDER — ASPIRIN 81 MG PO CHEW
81.0000 mg | CHEWABLE_TABLET | Freq: Every day | ORAL | Status: DC
  Administered 2018-10-27 – 2018-10-28 (×2): 81 mg via ORAL
  Filled 2018-10-26 (×2): qty 1

## 2018-10-26 MED ORDER — MAGNESIUM SULFATE 2 GM/50ML IV SOLN
2.0000 g | Freq: Once | INTRAVENOUS | Status: AC
  Administered 2018-10-26: 2 g via INTRAVENOUS
  Filled 2018-10-26: qty 50

## 2018-10-26 MED ORDER — PIPERACILLIN-TAZOBACTAM 3.375 G IVPB
3.3750 g | Freq: Three times a day (TID) | INTRAVENOUS | Status: DC
  Administered 2018-10-27 – 2018-10-28 (×4): 3.375 g via INTRAVENOUS
  Filled 2018-10-26 (×5): qty 50

## 2018-10-26 MED ORDER — THIAMINE HCL 100 MG/ML IJ SOLN
100.0000 mg | Freq: Every day | INTRAMUSCULAR | Status: DC
  Administered 2018-10-26 – 2018-10-27 (×2): 100 mg via INTRAVENOUS
  Filled 2018-10-26 (×2): qty 2

## 2018-10-26 MED ORDER — SENNA 8.6 MG PO TABS
1.0000 | ORAL_TABLET | Freq: Every day | ORAL | Status: DC
  Administered 2018-10-27 – 2018-10-28 (×2): 8.6 mg via ORAL
  Filled 2018-10-26 (×2): qty 1

## 2018-10-26 MED ORDER — CHLORHEXIDINE GLUCONATE 0.12% ORAL RINSE (MEDLINE KIT)
15.0000 mL | Freq: Two times a day (BID) | OROMUCOSAL | Status: DC
  Administered 2018-10-26 – 2018-10-27 (×2): 15 mL via OROMUCOSAL

## 2018-10-26 MED ORDER — ACETAMINOPHEN 325 MG PO TABS
650.0000 mg | ORAL_TABLET | ORAL | Status: DC | PRN
  Administered 2018-10-26: 650 mg via ORAL
  Filled 2018-10-26: qty 2

## 2018-10-26 NOTE — Progress Notes (Signed)
Aerogen set up for neb administration

## 2018-10-26 NOTE — Progress Notes (Signed)
SPUTUM COLLECTED AND SENT TO THE LAB

## 2018-10-26 NOTE — Progress Notes (Signed)
CRITICAL VALUE ALERT  Critical Value:  Lactic 3.7  Date & Time Notied:  10/26/18 2013  Provider Notified: Warren Lacy MD

## 2018-10-26 NOTE — Progress Notes (Signed)
[place] Progress Note Patient Name: DMARCO BALDUS DOB: 04-23-1960 MRN: [id number]   Date of Service  10/26/2018  HPI/Events of Note  Lactate 3.8 from 3.7. On norepinephrine  eICU Interventions  Ordered a 1 L LR bolus     Intervention Category Major Interventions: Hypotension - evaluation and management  Judd Lien 10/26/2018, 11:30 PM

## 2018-10-26 NOTE — Progress Notes (Signed)
CRITICAL VALUE ALERT  Critical Value: Lactic 3.8  Date & Time Notied: 10/26/18 2302  Provider Notified: Warren Lacy MD

## 2018-10-26 NOTE — Progress Notes (Signed)
Pharmacy Antibiotic Note  Tony Berry is a 58 y.o. male admitted on 10/26/2018 with possible pneumonia.  Pharmacy has been consulted for zosyn dosing. -SCr= 0.39 (on 7/11),  -tmax= 101.1  Plan: -zosyn 3.375gm IV q8h -Will follow renal function, cultures and clinical progress   Height: 5\' 7"  (170.2 cm) Weight: 114 lb 3.2 oz (51.8 kg) IBW/kg (Calculated) : 66.1  Temp (24hrs), Avg:101 F (38.3 C), Min:100.9 F (38.3 C), Max:101.1 F (38.4 C)  No results for input(s): WBC, CREATININE, LATICACIDVEN, VANCOTROUGH, VANCOPEAK, VANCORANDOM, GENTTROUGH, GENTPEAK, GENTRANDOM, TOBRATROUGH, TOBRAPEAK, TOBRARND, AMIKACINPEAK, AMIKACINTROU, AMIKACIN in the last 168 hours.  CrCl cannot be calculated (Patient's most recent lab result is older than the maximum 21 days allowed.).    No Known Allergies  Antimicrobials this admission: 8/4 zosyn>>  Dose adjustments this admission:   Microbiology results:  Thank you for allowing pharmacy to be a part of this patient's care.  Hildred Laser, PharmD Clinical Pharmacist **Pharmacist phone directory can now be found on [email or web address] ([email or web address]).  Listed under [place].

## 2018-10-26 NOTE — Progress Notes (Signed)
CRITICAL VALUE ALERT  Critical Value:  Troponin (High sensitivity) 132  Date & Time Notied:  10/26/18 2042  Provider Notified: Warren Lacy MD

## 2018-10-26 NOTE — H&P (Signed)
Name: Tony Berry MRN: [id number] DOB: 06/06/1960 Cc: sob , found down   LOS: 0  [facility] / Critical Care Note   History of Present Illness: 58 year old male with history of head and neck cancer status post radiation, dysphagia, severe protein calorie malnutrition, chronic respiratory failure on 3 L at baseline, HTN, hyponatremia and PVD presenting after being found down by family, had a pulse but had agonal breathing , intubated in the ED in [place] and transferred to our hospital. He was here for aspiration pneumonia in July but not intubated , noted indicate he has a PEG tube but still eats and does not follow SLP recs.  During my initial evaluation the patient is on the vent , moving all ext and following commands. He is on levophed infusion, versed and fentanyl.   Lines / Drains: PIV   Cultures: COVID NEG   Antibiotics: ZOSYN   Tests / Events: Transferred from [place]    Past Medical History:  Diagnosis Date  . Esophageal cancer ([place])   . Head and neck cancer ([place])   . Hypertension     Past Surgical History:  Procedure Laterality Date  . BACK SURGERY  2008, 2011  . FOOT SURGERY Right   . IR GASTROSTOMY TUBE MOD SED  04/08/2018  . LEG SURGERY Right     Prior to Admission medications   Medication Sig Start Date End Date Taking? Authorizing Provider  albuterol (VENTOLIN HFA) 108 (90 Base) MCG/ACT inhaler Inhale 2 puffs into the lungs every 6 (six) hours as needed for wheezing or shortness of breath. 10/02/18   Mercy Riding, MD  amoxicillin-clavulanate (AUGMENTIN) 875-125 MG tablet Take 1 tablet by mouth 2 (two) times daily. 10/02/18   Mercy Riding, MD  aspirin 81 MG chewable tablet Chew 1 tablet (81 mg total) by mouth daily. Please do not take for five days before your gastrostomy tube placement procedure Patient taking differently: Chew 81 mg by mouth daily.  03/16/18   Asencion Noble, MD  clopidogrel (PLAVIX) 75 MG tablet Take 1 tablet (75 mg total)  by mouth daily. Please do not take for the 5 days before your gastrostomy tube procedure 05/24/18   Revankar, Reita Cliche, MD  fluticasone (FLONASE) 50 MCG/ACT nasal spray Place 1 spray into both nostrils daily.    [provider]  furosemide (LASIX) 20 MG tablet Take 1 tablet (20 mg total) by mouth 2 (two) times daily. 10/01/18   Mercy Riding, MD  Maltodextrin-Xanthan Gum (RESOURCE THICKENUP CLEAR) POWD Take 120 g by mouth as needed. 03/15/18   Asencion Noble, MD  METHADONE HCL PO Take 115 mg by mouth daily.     [provider]  nicotine (NICODERM CQ - DOSED IN MG/24 HOURS) 21 mg/24hr patch Place 21 mg onto the skin daily.    [provider]  Nutritional Supplements (FEEDING SUPPLEMENT, JEVITY 1.2 CAL,) LIQD Place 1,000 mLs into feeding tube continuous. Six a day    [provider]  polyethylene glycol (MIRALAX / GLYCOLAX) packet Take 17 g by mouth daily. 03/16/18   Asencion Noble, MD  senna (SENOKOT) 8.6 MG TABS tablet Take 1 tablet by mouth daily.    [provider]  sodium chloride 1 g tablet Take 1 tablet (1 g total) by mouth 2 (two) times daily with a meal. 10/01/18   Mercy Riding, MD  Tiotropium Bromide-Olodaterol (STIOLTO RESPIMAT) 2.5-2.5 MCG/ACT AERS Inhale 2 puffs into the lungs daily. 10/01/18  Mercy Riding, MD    Allergies No Known Allergies  Family History Family History  Problem Relation Age of Onset  . COPD Mother   . Lung cancer Mother        on HOT   . COPD Father   . Pneumonia Father   . CAD Father        CABG   . COPD Sister   . Colon cancer Neg Hx     Social History  reports that he quit smoking about 7 months ago. His smoking use included cigarettes. He started smoking about 30 years ago. He has a 30.00 pack-year smoking history. He has quit using smokeless tobacco. He reports current alcohol use. He reports that he does not use drugs.  Review Of Systems:   Vital Signs: Temp:  [100.9 F (38.3 C)-101.3 F (38.5  C)] 101.3 F (38.5 C) (08/04 2000) Pulse Rate:  [75-83] 79 (08/04 2000) Resp:  [18-22] 22 (08/04 2000) BP: (95-152)/(59-66) 112/64 (08/04 2000) SpO2:  [92 %-100 %] 100 % (08/04 2030) FiO2 (%):  [60 %-100 %] 60 % (08/04 2030) Weight:  [51.8 kg] 51.8 kg (08/04 1844) I/O last 3 completed shifts: In: -  Out: 250 [Urine:250]  Physical Examination: General:cachectic on the vent Neuro: moves all extremities , follows simple commands CV: RRR, no m/r/g PULM: diffuse ronchi and exp wheezing GI: PEG in place no erythema Extremities: no c/c/e, +2 DP pulses distally , capillary refill ~2 seconds, skin is warm and dry   Ventilator settings: Vent Mode: PRVC FiO2 (%):  [60 %-100 %] 60 % Set Rate:  [18 bmp-22 bmp] 22 bmp Vt Set:  [520 mL-550 mL] 520 mL PEEP:  [5 cmH20] 5 cmH20 Plateau Pressure:  [17 cmH20-18 cmH20] 18 cmH20  Labs    CBC Recent Labs  Lab 10/26/18 1947 10/26/18 2006  HGB 12.7* 12.6*  HCT 43.3 37.0*  WBC 15.8*  --   PLT 226  --      BMET Recent Labs  Lab 10/26/18 1947 10/26/18 2006  NA 135 136  K 4.7 3.8  CL 89*  --   CO2 37*  --   GLUCOSE 158*  --   BUN 18  --   CREATININE 0.69  --   CALCIUM 8.6*  --   MG 1.4*  --   PHOS 1.1*  --     No results for input(s): INR in the last 168 hours.  Recent Labs  Lab 10/26/18 2006  PHART 7.497*  PCO2ART 53.0*  PO2ART 49.0*  HCO3 40.6*  TCO2 42*  O2SAT 83.0     Radiology: CT brain - normal CXR - bibasilar infiltrates worse on the LLL    Assessment and Plan: Principal Problem:   Acute on chronic respiratory failure with hypoxia and hypercapnia (HCC) Active Problems:   Protein-calorie malnutrition, severe   Dysphagia   Pressure injury of skin   Aspiration into airway   History of head and neck cancer  Neuro - acute toxic encephalopathy , CT brain neg , this is prob secondary to CO2 narcosis , found down by family , no interactive on the vent  CVS- septic shock, levophed for MAP > 65 , receiving  fluid resuscitation , might be able to come off , if not will need central line Hx of CAD , mild troponin elevation likely demand , EKG no st changes, cont asa and plavix  Lungs - acute on chronic resp failure with hypoxemia and hypercarbia secondary to aspiration  into the airway , this is recurrent , hx of dysphagia and PEG tube placement Hx of esophageal cancer on xrt Full vent support , VC adjusted to MV of 10 , pH 7.4 , FIO2 down to 40% ABG in am COPD exac - nebs and steroids  GI - hx of dysphagia , NGT , cont to aspirate. Nutrition c/s start TF  Onc- cancer hx as above  Consult Palliative care  Best practices / Disposition: -->Code Status: full -->DVT Px: hsq -->GI BK:[id number] -->Diet: start TF  Critical care time spent : 45 minutes excluding any procedures    10/26/2018, 8:57 PM

## 2018-10-27 ENCOUNTER — Inpatient Hospital Stay (HOSPITAL_COMMUNITY): Payer: Medicaid Other

## 2018-10-27 LAB — BASIC METABOLIC PANEL
Anion gap: 11 (ref 5–15)
BUN: 16 mg/dL (ref 6–20)
CO2: 34 mmol/L — ABNORMAL HIGH (ref 22–32)
Calcium: 8.1 mg/dL — ABNORMAL LOW (ref 8.9–10.3)
Chloride: 92 mmol/L — ABNORMAL LOW (ref 98–111)
Creatinine, Ser: 0.54 mg/dL — ABNORMAL LOW (ref 0.61–1.24)
GFR calc Af Amer: >60 mL/min (ref >60)
GFR calc non Af Amer: >60 mL/min (ref >60)
Glucose, Bld: 104 mg/dL — ABNORMAL HIGH (ref 70–99)
Potassium: 4.1 mmol/L (ref 3.5–5.1)
Sodium: 137 mmol/L (ref 135–145)

## 2018-10-27 LAB — URINALYSIS, ROUTINE W REFLEX MICROSCOPIC
Bilirubin Urine: NEGATIVE
Glucose, UA: NEGATIVE mg/dL
Hgb urine dipstick: NEGATIVE
Ketones, ur: NEGATIVE mg/dL
Leukocytes,Ua: NEGATIVE
Nitrite: NEGATIVE
Protein, ur: NEGATIVE mg/dL
Specific Gravity, Urine: 1.014 (ref 1.005–1.030)
pH: 7 (ref 5.0–8.0)

## 2018-10-27 LAB — GLUCOSE, CAPILLARY
Glucose-Capillary: 99 mg/dL (ref 70–99)
Glucose-Capillary: 112 mg/dL — ABNORMAL HIGH (ref 70–99)
Glucose-Capillary: 124 mg/dL — ABNORMAL HIGH (ref 70–99)
Glucose-Capillary: 112 mg/dL — ABNORMAL HIGH (ref 70–99)
Glucose-Capillary: 132 mg/dL — ABNORMAL HIGH (ref 70–99)

## 2018-10-27 LAB — CBC
HCT: 33.8 % — ABNORMAL LOW (ref 39.0–52.0)
Hemoglobin: 10.1 g/dL — ABNORMAL LOW (ref 13.0–17.0)
MCH: 28.6 pg (ref 26.0–34.0)
MCHC: 29.9 g/dL — ABNORMAL LOW (ref 30.0–36.0)
MCV: 95.8 fL (ref 80.0–100.0)
Platelets: 200 10*3/uL (ref 150–400)
RBC: 3.53 MIL/uL — ABNORMAL LOW (ref 4.22–5.81)
RDW: 12.4 % (ref 11.5–15.5)
WBC: 15.2 10*3/uL — ABNORMAL HIGH (ref 4.0–10.5)
nRBC: 0 % (ref 0.0–0.2)

## 2018-10-27 LAB — PHOSPHORUS
Phosphorus: 1.3 mg/dL — ABNORMAL LOW (ref 2.5–4.6)
Phosphorus: 3.1 mg/dL (ref 2.5–4.6)
Phosphorus: 3.8 mg/dL (ref 2.5–4.6)
Phosphorus: 3.1 mg/dL (ref 2.5–4.6)

## 2018-10-27 LAB — MAGNESIUM
Magnesium: 1.6 mg/dL — ABNORMAL LOW (ref 1.7–2.4)
Magnesium: 1.9 mg/dL (ref 1.7–2.4)
Magnesium: 1.7 mg/dL (ref 1.7–2.4)

## 2018-10-27 LAB — MRSA PCR SCREENING: MRSA by PCR: NEGATIVE

## 2018-10-27 LAB — STREP PNEUMONIAE URINARY ANTIGEN: Strep Pneumo Urinary Antigen: NEGATIVE

## 2018-10-27 LAB — PROCALCITONIN: Procalcitonin: 1.43 ng/mL

## 2018-10-27 LAB — HEMOGLOBIN A1C
Hgb A1c MFr Bld: 5.5 % (ref 4.8–5.6)
Mean Plasma Glucose: 111.15 mg/dL

## 2018-10-27 MED ORDER — PREDNISONE 5 MG/5ML PO SOLN
40.0000 mg | Freq: Every day | ORAL | Status: DC
  Administered 2018-10-27 – 2018-10-28 (×2): 40 mg via ORAL
  Filled 2018-10-27 (×2): qty 40

## 2018-10-27 MED ORDER — ORAL CARE MOUTH RINSE
15.0000 mL | Freq: Two times a day (BID) | OROMUCOSAL | Status: DC
  Administered 2018-10-27 – 2018-10-29 (×4): 15 mL via OROMUCOSAL

## 2018-10-27 MED ORDER — PRO-STAT SUGAR FREE PO LIQD
30.0000 mL | Freq: Two times a day (BID) | ORAL | Status: DC
  Administered 2018-10-27: 30 mL
  Filled 2018-10-27: qty 30

## 2018-10-27 MED ORDER — JEVITY 1.5 CAL/FIBER PO LIQD
1000.0000 mL | ORAL | Status: DC
  Administered 2018-10-27 – 2018-10-31 (×6): 1000 mL
  Filled 2018-10-27 (×6): qty 1000

## 2018-10-27 MED ORDER — METHYLPREDNISOLONE SODIUM SUCC 40 MG IJ SOLR
40.0000 mg | Freq: Two times a day (BID) | INTRAMUSCULAR | Status: DC
  Administered 2018-10-27: 40 mg via INTRAVENOUS
  Filled 2018-10-27: qty 1

## 2018-10-27 MED ORDER — SODIUM PHOSPHATES 45 MMOLE/15ML IV SOLN
30.0000 mmol | Freq: Once | INTRAVENOUS | Status: AC
  Administered 2018-10-27: 30 mmol via INTRAVENOUS
  Filled 2018-10-27: qty 10

## 2018-10-27 MED ORDER — PRO-STAT SUGAR FREE PO LIQD
30.0000 mL | Freq: Every day | ORAL | Status: DC
  Administered 2018-10-28 – 2018-10-31 (×4): 30 mL
  Filled 2018-10-27 (×4): qty 30

## 2018-10-27 MED ORDER — CHLORHEXIDINE GLUCONATE 0.12 % MT SOLN
15.0000 mL | Freq: Two times a day (BID) | OROMUCOSAL | Status: DC
  Administered 2018-10-27 – 2018-10-31 (×8): 15 mL via OROMUCOSAL
  Filled 2018-10-27 (×7): qty 15

## 2018-10-27 MED ORDER — MAGNESIUM SULFATE 2 GM/50ML IV SOLN
2.0000 g | Freq: Once | INTRAVENOUS | Status: AC
  Administered 2018-10-27: 2 g via INTRAVENOUS
  Filled 2018-10-27: qty 50

## 2018-10-27 MED ORDER — VITAL HIGH PROTEIN PO LIQD: 1000.0000 mL | ORAL | Status: DC

## 2018-10-27 MED ORDER — INSULIN ASPART 100 UNIT/ML ~~LOC~~ SOLN
0.0000 [IU] | SUBCUTANEOUS | Status: DC
  Administered 2018-10-27 (×2): 1 [IU] via SUBCUTANEOUS

## 2018-10-27 NOTE — H&P (Addendum)
PLEASE NOTE THIS IS A PROGRESS NOTE NOT AN H+P   Name: Tony Berry MRN: [id number] DOB: 1960-12-28 Cc: sob , found down   LOS: 1  [facility] / Critical Care Note   History of Present Illness: 58 year old male with history of head and neck cancer status post radiation, dysphagia, severe protein calorie malnutrition, chronic respiratory failure on 3 L at baseline, HTN, hyponatremia and PVD presenting after being found down by family, had a pulse but had agonal breathing , intubated in the ED in [place] and transferred to our hospital. He was here for aspiration pneumonia in July but not intubated , noted indicate he has a PEG tube but still eats and does not follow SLP recs.  During my initial evaluation the patient is on the vent , moving all ext and following commands. He is on levophed infusion, versed and fentanyl.   Lines / Drains: PIV   Cultures: COVID NEG   Antibiotics: ZOSYN   SUBJECTIVE: no events overnight, on high doses of sedatives still and minimal levophed.  Minimal vent requirements.   Past Medical History:  Diagnosis Date  . Esophageal cancer ([place])   . Head and neck cancer ([place])   . Hypertension     Past Surgical History:  Procedure Laterality Date  . BACK SURGERY  2008, 2011  . FOOT SURGERY Right   . IR GASTROSTOMY TUBE MOD SED  04/08/2018  . LEG SURGERY Right     Prior to Admission medications   Medication Sig Start Date End Date Taking? Authorizing Provider  albuterol (VENTOLIN HFA) 108 (90 Base) MCG/ACT inhaler Inhale 2 puffs into the lungs every 6 (six) hours as needed for wheezing or shortness of breath. 10/02/18   Mercy Riding, MD  amoxicillin-clavulanate (AUGMENTIN) 875-125 MG tablet Take 1 tablet by mouth 2 (two) times daily. 10/02/18   Mercy Riding, MD  aspirin 81 MG chewable tablet Chew 1 tablet (81 mg total) by mouth daily. Please do not take for five days before your gastrostomy tube placement procedure Patient taking differently:  Chew 81 mg by mouth daily.  03/16/18   Asencion Noble, MD  clopidogrel (PLAVIX) 75 MG tablet Take 1 tablet (75 mg total) by mouth daily. Please do not take for the 5 days before your gastrostomy tube procedure 05/24/18   Revankar, Reita Cliche, MD  fluticasone (FLONASE) 50 MCG/ACT nasal spray Place 1 spray into both nostrils daily.    [provider]  furosemide (LASIX) 20 MG tablet Take 1 tablet (20 mg total) by mouth 2 (two) times daily. 10/01/18   Mercy Riding, MD  Maltodextrin-Xanthan Gum (RESOURCE THICKENUP CLEAR) POWD Take 120 g by mouth as needed. 03/15/18   Asencion Noble, MD  METHADONE HCL PO Take 115 mg by mouth daily.     [provider]  nicotine (NICODERM CQ - DOSED IN MG/24 HOURS) 21 mg/24hr patch Place 21 mg onto the skin daily.    [provider]  Nutritional Supplements (FEEDING SUPPLEMENT, JEVITY 1.2 CAL,) LIQD Place 1,000 mLs into feeding tube continuous. Six a day    [provider]  polyethylene glycol (MIRALAX / GLYCOLAX) packet Take 17 g by mouth daily. 03/16/18   Asencion Noble, MD  senna (SENOKOT) 8.6 MG TABS tablet Take 1 tablet by mouth daily.    [provider]  sodium chloride 1 g tablet Take 1 tablet (1 g total) by mouth 2 (two) times daily with a meal. 10/01/18  Mercy Riding, MD  Tiotropium Bromide-Olodaterol (STIOLTO RESPIMAT) 2.5-2.5 MCG/ACT AERS Inhale 2 puffs into the lungs daily. 10/01/18   Mercy Riding, MD    Allergies No Known Allergies  Family History Family History  Problem Relation Age of Onset  . COPD Mother   . Lung cancer Mother        on HOT   . COPD Father   . Pneumonia Father   . CAD Father        CABG   . COPD Sister   . Colon cancer Neg Hx     Social History  reports that he quit smoking about 8 months ago. His smoking use included cigarettes. He started smoking about 30 years ago. He has a 30.00 pack-year smoking history. He has quit using smokeless tobacco. He reports current alcohol  use. He reports that he does not use drugs.  Review Of Systems:   Vital Signs: Temp:  [97.5 F (36.4 C)-101.3 F (38.5 C)] 100.8 F (38.2 C) (08/05 0715) Pulse Rate:  [68-95] 94 (08/05 0726) Resp:  [18-23] 22 (08/05 0726) BP: (92-152)/(51-118) 100/54 (08/05 0726) SpO2:  [92 %-100 %] 98 % (08/05 0728) FiO2 (%):  [40 %-100 %] 40 % (08/05 0728) Weight:  [51.8 kg-52.2 kg] 52.2 kg (08/05 0500) I/O last 3 completed shifts: In: 3083.2 [I.V.:1647.9; IV Piggyback:1435.3] Out: 1160 [Urine:1160]  Physical Examination: GEN: cachetic man in NAD HEENT: ETT in place, mild secretions CV: Tachycardic, ext warm PULM: suprisingly clear, passive on vent GI: soft, hypactive BS EXT: muscle wasting, no edema NEURO: heavily sedated, will await SAT PSYCH: as above SKIN: scarring around neck suspected radiation damage    Ventilator settings: Vent Mode: PRVC FiO2 (%):  [40 %-100 %] 40 % Set Rate:  [18 bmp-22 bmp] 22 bmp Vt Set:  [520 mL-550 mL] 520 mL PEEP:  [5 cmH20] 5 cmH20 Plateau Pressure:  [17 cmH20-18 cmH20] 18 cmH20  Labs    CBC Recent Labs  Lab 10/26/18 1947 10/26/18 2006 10/27/18 0321  HGB 12.7* 12.6* 10.1*  HCT 43.3 37.0* 33.8*  WBC 15.8*  --  15.2*  PLT 226  --  200     BMET Recent Labs  Lab 10/26/18 1947 10/26/18 2006 10/27/18 0321  NA 135 136 137  K 4.7 3.8 4.1  CL 89*  --  92*  CO2 37*  --  34*  GLUCOSE 158*  --  104*  BUN 18  --  16  CREATININE 0.69  --  0.54*  CALCIUM 8.6*  --  8.1*  MG 1.4*  --  1.6*  PHOS 1.1*  --  1.3*    No results for input(s): INR in the last 168 hours.  Recent Labs  Lab 10/26/18 2006  PHART 7.497*  PCO2ART 53.0*  PO2ART 49.0*  HCO3 40.6*  TCO2 42*  O2SAT 83.0     Radiology: CT brain - normal CXR - improving bilateral infiltrates    Assessment and Plan: # Acute on chronic hypoxemic and hypercarbic respiratory failure secondary to aspiration pneumonitis +/- AECOPD.  Improved. # Severe protein calorie malnutrition  present on admission, various electrolytes disturbances due to this # Septic shock due to aspiration event # Dysphagia s/p PEG # Chronic hyponatremia # Underlying O2 dependent COPD # Persistent mild lactic acidosis, unclear why trending this # Demand-mismatch troponin leak, NTD with this  - SAT/SBT - Stop IVF - Switch solumedrol to prednisone - Zosyn fine for now, check procal, de-escalate as able - Nebs as  ordered  Best practice Diet: TF later today Pain/Anxiety/Delirium protocol (if indicated): SAT VAP protocol (if indicated): Ordered DVT prophylaxis: Heparin GI prophylaxis: pepcid Glucose control: SSI Mobility: mobility if we can get him extubated, PT conuslt Code Status: full, palliative care has been consulted Family Communication: Called and updated wife today, she states he has been having aspiration issues since November, not interested in trach.  She understands they may have to make some tough decisions if this keeps happening.  This is his first intubation. Disposition:  If does well with extubation, fine for PCU.  35 minutes CC time     10/27/2018, 8:37 AM

## 2018-10-27 NOTE — Progress Notes (Signed)
 Initial Nutrition Assessment  DOCUMENTATION CODES:   Underweight, Severe malnutrition in context of chronic illness  INTERVENTION:   Tube Feeding:  Jevity 1.5 at 50 ml/hr Begin at 20 ml/hr; titrate by 10 mL q 8 hours until goal rate of 50 ml/hr Pro-Stat 30 mL daily Provides 82 g of protein, 1900 kcals and 912 mL of free water Meets 100% estimated calorie and protein needs  Monitor magnesium, potassium, and phosphorus daily for at least 5 occurences, MD to replete as needed, as pt is at risk for refeeding syndrome given severe malnutrition.   NUTRITION DIAGNOSIS:   Severe Malnutrition related to chronic illness as evidenced by severe fat depletion, severe muscle depletion.  GOAL:   Patient will meet greater than or equal to 90% of their needs  MONITOR:   Vent status, TF tolerance, Weight trends, Labs, Skin  REASON FOR ASSESSMENT:   Consult, Ventilator Enteral/tube feeding initiation and management  ASSESSMENT:   58 yo male with hx of head and neck cancer s/p radiation, dysphagia with feeding tube, severe protein calorie malnutrition, chronic respiratory failure on 3L [place], HTN and chronic hyponatremia who was admitted on 8/4 with acute on chronic respiratory failure due to aspiration pneumonitis +/- AECOPD requiring intubation.  8/4 Admit, Intubated 8/5 Extubated  Extubated this morning. Confused. Unable to get diet and weight history at this time  Pt has PEG tube but still takes po per MD notes and does not follow SLP recommendations  SLP evaluated pt 1 month ago and placed pt on Nectar Thick Liquid diet only, no solids. Unsure how compliant pt has been with his diet or his TF. Pt meets criteria for severe malnutrition on exam. Per home med list, pt takes 6 cartons of Jevity 1.2 per day. 1710 kcals, 79 g of protein.   Admit weight 51.8 kg; 52.5 kg. Weight appears relatively stable per weight encounters but pt is UNDERWEIGHT  Labs: phosphorus 1.3 (L) Meds: sodium  phosphate, potassium phosphate, prednisone, salt tabs   NUTRITION - FOCUSED PHYSICAL EXAM:    Most Recent Value  Orbital Region  Severe depletion  Upper Arm Region  Severe depletion  Thoracic and Lumbar Region  Severe depletion  Buccal Region  Severe depletion  Temple Region  Severe depletion  Clavicle Bone Region  Severe depletion  Clavicle and Acromion Bone Region  Severe depletion  Scapular Bone Region  Severe depletion  Dorsal Hand  Unable to assess  Patellar Region  Severe depletion  Anterior Thigh Region  Severe depletion  Posterior Calf Region  Severe depletion  Edema (RD Assessment)  None       Diet Order:   Diet Order            Diet NPO time specified  Diet effective now              EDUCATION NEEDS:   Not appropriate for education at this time  Skin:  Skin Assessment: Skin Integrity Issues: Skin Integrity Issues:: DTI DTI: sacrum  Last BM:  no documented BM  Height:   Ht Readings from Last 1 Encounters:  10/26/18 5\' 7"  (1.702 m)    Weight:   Wt Readings from Last 1 Encounters:  10/27/18 52.2 kg    Ideal Body Weight:  67.3 kg  BMI:  Body mass index is 18.02 kg/m.  Estimated Nutritional Needs:   Kcal:  1850-2080 kcals  Protein:  80-105 g  Fluid:  >/= 2 L    Shaliah Wann MS, RDN, LDN, CNSC [phone]  Pager  [phone] Weekend/On-Call Pager

## 2018-10-27 NOTE — Procedures (Addendum)
Extubation Procedure Note  Patient Details:   Name: JUD FANGUY DOB: 25-Oct-1960 MRN: [id number]   Airway Documentation:    Vent end date: 10/27/18 Vent end time: 1105   Evaluation  O2 sats: stable throughout Complications: No apparent complications Patient did tolerate procedure well. Bilateral Breath Sounds: Clear, Diminished   Yes   Pt extubated per MD Smith's order. Initially placed pt on 6L [place]. SATs remained 97%. Liter flow gradually decreased to 4LPM per pt's home O2 with SATs remaining 96-97%. Cuff leak heard before procedure. Ventilator placed on standby. Pt remains stable with rhonchus bilateral breath sounds. Pt is resting at this time, no stridor heard and no apparent complications noted. RN, RT and MD at bedside.   Esperanza Sheets T 10/27/2018, 11:06 AM

## 2018-10-27 NOTE — Progress Notes (Signed)
[facility] ADULT ICU REPLACEMENT PROTOCOL FOR AM LAB REPLACEMENT ONLY  The patient does apply for the [facility] Adult ICU Electrolyte Replacment Protocol based on the criteria listed below:   1. Is GFR >/= 40 ml/min? Yes.    Patient's GFR today is >60 2. Is urine output >/= 0.5 ml/kg/hr for the last 6 hours? Yes.   Patient's UOP is .6 ml/kg/hr 3. Is BUN < 60 mg/dL? Yes.    Patient's BUN today is 16 4. Abnormal electrolyte(s): Phos-1.3 Mag-1.6 5. Ordered repletion with: per protocol 6. If a panic level lab has been reported, has the CCM MD in charge been notified? Yes.  .   Physician:  Dr. Wilson Singer, Philis [place] 10/27/2018 5:09 AM

## 2018-10-27 NOTE — Consult Note (Signed)
Consultation Note Date: 10/27/2018   Patient Name: Tony Berry  DOB: August 25, 1960  MRN: [id number]  Age / Sex: 58 y.o., male  PCP: Cher Nakai, MD Referring Physician: Roxanne Mins, MD  Reason for Consultation: Establishing goals of care and Psychosocial/spiritual support  HPI/Patient Profile: 58 y.o. male   admitted on 10/26/2018 with past medical history  of head and neck cancer status post radiation, dysphagia, severe protein calorie malnutrition, chronic respiratory failure on 3 L at baseline, HTN, hyponatremia and PVD presenting after being found down by family, had a pulse but had agonal breathing , intubated in the ED at [facility] and transferred to [facility].   He was here for aspiration pneumonia in July but not intubated , noted indicate he has a PEG tube but still eats and does not follow SLP recs.  Wife tells me he is eating half a gallon of ice cream a day.  Wife reports significant weight loss.  Today the patient's wife tells me that she is basically disabled herself and unable to come to the hospital.  Currently the patient is not under any oncology care, his pulmonologist is Dr. Valeta Harms, and his PCP is Dr. Truman Hayward in [place]  Today patient was extubated.  He remains confused and high risk for decompensation.  Patient and his family face treatment option decisions, advanced directive decisions and anticipatory care needs.     Clinical Assessment and Goals of Care:   This NP Wadie Lessen reviewed medical records, received report from team, assessed the patient and then spoke to his wife by phone to discuss diagnosis, prognosis, GOC, EOL wishes disposition and options.  Concept of Hospice and Palliative Care were discussed  A detailed discussion was had today regarding advanced directives.  Concepts specific to code status, artifical feeding and hydration, continued IV antibiotics and rehospitalization  was had.  The difference between a aggressive medical intervention path  and a palliative comfort care path for this patient at this time was had.  Values and goals of care important to patient and family were attempted to be elicited.   Patient's wife remains hopeful for improvement.  At this time they are open to all offered and available medical interventions to prolong life.   Questions and concerns addressed.   Family encouraged to call with questions or concerns.    PMT will continue to support holistically.       NEXT OF KIN    SUMMARY OF RECOMMENDATIONS    Code Status/Advance Care Planning:  Full code-I discussed with his wife today that previously patient had been documented as a DNR/DNI.  She tells me that recent conversation has changed the patient's and family's stand on this and the patient remains a full code   Palliative Prophylaxis:   Aspiration, Bowel Regimen, Delirium Protocol, Frequent Pain Assessment and Oral Care  Additional Recommendations (Limitations, Scope, Preferences):  Full Scope Treatment  Psycho-social/Spiritual:   Desire for further Chaplaincy support:yes   Prognosis:   < 12 months  Discharge Planning: To Be Determined  Primary Diagnoses: Present on Admission: . Acute on chronic respiratory failure with hypoxia and hypercapnia (HCC) . Aspiration into airway . Protein-calorie malnutrition, severe   I have reviewed the medical record, interviewed the patient and family, and examined the patient. The following aspects are pertinent.  Past Medical History:  Diagnosis Date  . Esophageal cancer ([place])   . Head and neck cancer ([place])   . Hypertension    Social History   Socioeconomic History  . Marital status: Married    Spouse name: Not on file  . Number of children: Not on file  . Years of education: Not on file  . Highest education level: Not on file  Occupational History  . Occupation: Curator    Comment: Retired    Scientific laboratory technician  . Financial resource strain: Not on file  . Food insecurity    Worry: Not on file    Inability: Not on file  . Transportation needs    Medical: Not on file    Non-medical: Not on file  Tobacco Use  . Smoking status: Former Smoker    Packs/day: 1.00    Years: 30.00    Pack years: 30.00    Types: Cigarettes    Start date: 03/11/1988    Quit date: 02/25/2018    Years since quitting: 0.6  . Smokeless tobacco: Former Network engineer and Sexual Activity  . Alcohol use: Yes    Comment: 30 years ago   . Drug use: No  . Sexual activity: Not on file  Lifestyle  . Physical activity    Days per week: Not on file    Minutes per session: Not on file  . Stress: Not on file  Relationships  . Social Herbalist on phone: Not on file    Gets together: Not on file    Attends religious service: Not on file    Active member of club or organization: Not on file    Attends meetings of clubs or organizations: Not on file    Relationship status: Not on file  Other Topics Concern  . Not on file  Social History Narrative  . Not on file   Family History  Problem Relation Age of Onset  . COPD Mother   . Lung cancer Mother        on HOT   . COPD Father   . Pneumonia Father   . CAD Father        CABG   . COPD Sister   . Colon cancer Neg Hx    Scheduled Meds: . aspirin  81 mg Oral Daily  . chlorhexidine gluconate (MEDLINE KIT)  15 mL Mouth Rinse BID  . clopidogrel  75 mg Oral Daily  . feeding supplement (PRO-STAT SUGAR FREE 64)  30 mL Per Tube BID  . feeding supplement (VITAL HIGH PROTEIN)  1,000 mL Per Tube Q24H  . heparin  5,000 Units Subcutaneous Q8H  . insulin aspart  0-9 Units Subcutaneous Q4H  . ipratropium-albuterol  3 mL Nebulization Q6H  . mouth rinse  15 mL Mouth Rinse 10 times per day  . polyethylene glycol  17 g Oral Daily  . predniSONE  40 mg Oral Q breakfast  . senna  1 tablet Oral Daily  . sodium chloride  1 g Oral BID WC  . thiamine injection   100 mg Intravenous Daily   Continuous Infusions: . famotidine (PEPCID) IV 20 mg (10/27/18 0910)  . fentaNYL infusion INTRAVENOUS Stopped (10/27/18  9509)  . midazolam Stopped (10/27/18 3267)  . norepinephrine (LEVOPHED) Adult infusion 2 mcg/min (10/27/18 0900)  . piperacillin-tazobactam (ZOSYN)  IV Stopped (10/27/18 0740)  . sodium phosphate  Dextrose 5% IVPB 43 mL/hr at 10/27/18 0900   PRN Meds:.acetaminophen, ondansetron (ZOFRAN) IV Medications Prior to Admission:  Prior to Admission medications   Medication Sig Start Date End Date Taking? Authorizing Provider  albuterol (PROVENTIL) (2.5 MG/3ML) 0.083% nebulizer solution Take 2.5 mg by nebulization 2 (two) times daily.   Yes [provider]  albuterol (VENTOLIN HFA) 108 (90 Base) MCG/ACT inhaler Inhale 2 puffs into the lungs every 6 (six) hours as needed for wheezing or shortness of breath. 10/02/18  Yes Mercy Riding, MD  aspirin 81 MG chewable tablet Chew 1 tablet (81 mg total) by mouth daily. Please do not take for five days before your gastrostomy tube placement procedure Patient taking differently: Chew 81 mg by mouth daily.  03/16/18  Yes Asencion Noble, MD  clopidogrel (PLAVIX) 75 MG tablet Take 1 tablet (75 mg total) by mouth daily. Please do not take for the 5 days before your gastrostomy tube procedure 05/24/18  Yes Revankar, Reita Cliche, MD  fluticasone (FLONASE) 50 MCG/ACT nasal spray Place 1 spray into both nostrils daily.   Yes [provider]  furosemide (LASIX) 20 MG tablet Take 1 tablet (20 mg total) by mouth 2 (two) times daily. 10/01/18  Yes Mercy Riding, MD  Ipratropium-Albuterol (DUONEB IN) Inhale 3 mLs into the lungs 2 (two) times daily.   Yes [provider]  Maltodextrin-Xanthan Gum (RESOURCE THICKENUP CLEAR) POWD Take 120 g by mouth as needed. 03/15/18  Yes Asencion Noble, MD  METHADONE HCL PO Take 115 mg by mouth daily.    Yes [provider]  Nutritional Supplements (FEEDING  SUPPLEMENT, JEVITY 1.2 CAL,) LIQD Place 1,000 mLs into feeding tube continuous. Six a day   Yes [provider]  ondansetron (ZOFRAN) 4 MG tablet Take 4 mg by mouth every 8 (eight) hours as needed for nausea or vomiting.   Yes [provider]  polyethylene glycol (MIRALAX / GLYCOLAX) packet Take 17 g by mouth daily. 03/16/18  Yes Asencion Noble, MD  senna (SENOKOT) 8.6 MG TABS tablet Take 1 tablet by mouth daily.   Yes [provider]  sodium chloride 1 g tablet Take 1 tablet (1 g total) by mouth 2 (two) times daily with a meal. 10/01/18  Yes Gonfa, Taye T, MD  Tiotropium Bromide-Olodaterol (STIOLTO RESPIMAT) 2.5-2.5 MCG/ACT AERS Inhale 2 puffs into the lungs daily. 10/01/18  Yes Mercy Riding, MD  amoxicillin-clavulanate (AUGMENTIN) 875-125 MG tablet Take 1 tablet by mouth 2 (two) times daily. Patient not taking: Reported on 10/27/2018 10/02/18   Mercy Riding, MD  nicotine (NICODERM CQ - DOSED IN MG/24 HOURS) 21 mg/24hr patch Place 21 mg onto the skin daily.    [provider]   No Known Allergies Review of Systems  Unable to perform ROS: Intubated    Physical Exam Constitutional:      General: He is in acute distress.     Appearance: He is cachectic.     Interventions: Nasal cannula in place.  Cardiovascular:     Rate and Rhythm: Tachycardia present.  Pulmonary:     Breath sounds: Decreased breath sounds present.  Skin:    General: Skin is warm and dry.  Neurological:     Mental Status: He is disoriented.     Vital Signs:  BP 126/63   Pulse (!) 102   Temp (!) 101.3 F (38.5 C)   Resp 18   Ht '5\' 7"'  (1.702 m)   Wt 52.2 kg   SpO2 95%   BMI 18.02 kg/m  Pain Scale: CPOT       SpO2: SpO2: 95 % O2 Device:SpO2: 95 % O2 Flow Rate: .   IO: Intake/output summary:   Intake/Output Summary (Last 24 hours) at 10/27/2018 1103 Last data filed at 10/27/2018 8548 Gross per 24 hour  Intake 3805.04 ml  Output 1390 ml  Net 2415.04 ml    LBM: Last  BM Date: (PTA) Baseline Weight: Weight: 51.8 kg Most recent weight: Weight: 52.2 kg     Palliative Assessment/Data:   Discussed with  bedside RN  Time In: 1215 Time Out: 1325 Time Total: 70 minutes Greater than 50%  of this time was spent counseling and coordinating care related to the above assessment and plan.  Signed by: Wadie Lessen, NP   Please contact Palliative Medicine Team phone at [phone] for questions and concerns.  For individual provider: See Shea Evans

## 2018-10-28 DIAGNOSIS — Z8589 Personal history of malignant neoplasm of other organs and systems: Secondary | ICD-10-CM

## 2018-10-28 DIAGNOSIS — Z7189 Other specified counseling: Secondary | ICD-10-CM

## 2018-10-28 DIAGNOSIS — Z515 Encounter for palliative care: Secondary | ICD-10-CM

## 2018-10-28 LAB — CBC
HCT: 34 % — ABNORMAL LOW (ref 39.0–52.0)
Hemoglobin: 10.3 g/dL — ABNORMAL LOW (ref 13.0–17.0)
MCH: 28.2 pg (ref 26.0–34.0)
MCHC: 30.3 g/dL (ref 30.0–36.0)
MCV: 93.2 fL (ref 80.0–100.0)
Platelets: 214 10*3/uL (ref 150–400)
RBC: 3.65 MIL/uL — ABNORMAL LOW (ref 4.22–5.81)
RDW: 13.6 % (ref 11.5–15.5)
WBC: 17.8 10*3/uL — ABNORMAL HIGH (ref 4.0–10.5)
nRBC: 0 % (ref 0.0–0.2)

## 2018-10-28 LAB — MAGNESIUM
Magnesium: 1.7 mg/dL (ref 1.7–2.4)
Magnesium: 1.8 mg/dL (ref 1.7–2.4)

## 2018-10-28 LAB — LEGIONELLA PNEUMOPHILA SEROGP 1 UR AG: L. pneumophila Serogp 1 Ur Ag: NEGATIVE

## 2018-10-28 LAB — GLUCOSE, CAPILLARY
Glucose-Capillary: 114 mg/dL — ABNORMAL HIGH (ref 70–99)
Glucose-Capillary: 115 mg/dL — ABNORMAL HIGH (ref 70–99)
Glucose-Capillary: 115 mg/dL — ABNORMAL HIGH (ref 70–99)
Glucose-Capillary: 132 mg/dL — ABNORMAL HIGH (ref 70–99)
Glucose-Capillary: 136 mg/dL — ABNORMAL HIGH (ref 70–99)
Glucose-Capillary: 138 mg/dL — ABNORMAL HIGH (ref 70–99)

## 2018-10-28 LAB — BASIC METABOLIC PANEL
Anion gap: 8 (ref 5–15)
BUN: 15 mg/dL (ref 6–20)
CO2: 36 mmol/L — ABNORMAL HIGH (ref 22–32)
Calcium: 8.3 mg/dL — ABNORMAL LOW (ref 8.9–10.3)
Chloride: 91 mmol/L — ABNORMAL LOW (ref 98–111)
Creatinine, Ser: 0.38 mg/dL — ABNORMAL LOW (ref 0.61–1.24)
GFR calc Af Amer: >60 mL/min (ref >60)
GFR calc non Af Amer: >60 mL/min (ref >60)
Glucose, Bld: 136 mg/dL — ABNORMAL HIGH (ref 70–99)
Potassium: 3.3 mmol/L — ABNORMAL LOW (ref 3.5–5.1)
Sodium: 135 mmol/L (ref 135–145)

## 2018-10-28 LAB — PHOSPHORUS
Phosphorus: 2.3 mg/dL — ABNORMAL LOW (ref 2.5–4.6)
Phosphorus: 3.5 mg/dL (ref 2.5–4.6)

## 2018-10-28 MED ORDER — SENNA 8.6 MG PO TABS
1.0000 | ORAL_TABLET | Freq: Every day | ORAL | Status: DC
  Administered 2018-10-29 – 2018-10-30 (×2): 8.6 mg
  Filled 2018-10-28 (×3): qty 1

## 2018-10-28 MED ORDER — INSULIN ASPART 100 UNIT/ML ~~LOC~~ SOLN
0.0000 [IU] | SUBCUTANEOUS | Status: DC
  Administered 2018-10-28 – 2018-10-29 (×4): 1 [IU] via SUBCUTANEOUS

## 2018-10-28 MED ORDER — POLYETHYLENE GLYCOL 3350 17 G PO PACK
17.0000 g | PACK | Freq: Every day | ORAL | Status: DC
  Administered 2018-10-30: 17 g
  Filled 2018-10-28 (×2): qty 1

## 2018-10-28 MED ORDER — ASPIRIN 81 MG PO CHEW
81.0000 mg | CHEWABLE_TABLET | Freq: Every day | ORAL | Status: DC
  Administered 2018-10-29 – 2018-10-31 (×3): 81 mg
  Filled 2018-10-28 (×3): qty 1

## 2018-10-28 MED ORDER — VITAMIN B-1 100 MG PO TABS
100.0000 mg | ORAL_TABLET | Freq: Every day | ORAL | Status: DC
  Administered 2018-10-29 – 2018-10-31 (×3): 100 mg
  Filled 2018-10-28 (×3): qty 1

## 2018-10-28 MED ORDER — CLOPIDOGREL BISULFATE 75 MG PO TABS
75.0000 mg | ORAL_TABLET | Freq: Every day | ORAL | Status: DC
  Administered 2018-10-29 – 2018-10-31 (×3): 75 mg
  Filled 2018-10-28 (×3): qty 1

## 2018-10-28 MED ORDER — ACETAMINOPHEN 325 MG PO TABS
650.0000 mg | ORAL_TABLET | ORAL | Status: DC | PRN
  Administered 2018-10-29 – 2018-10-30 (×2): 650 mg
  Filled 2018-10-28 (×2): qty 2

## 2018-10-28 MED ORDER — ADULT MULTIVITAMIN LIQUID CH
15.0000 mL | Freq: Every day | ORAL | Status: DC
  Administered 2018-10-29 – 2018-10-31 (×3): 15 mL
  Filled 2018-10-28 (×3): qty 15

## 2018-10-28 MED ORDER — VITAMIN B-1 100 MG PO TABS
100.0000 mg | ORAL_TABLET | Freq: Every day | ORAL | Status: DC
  Administered 2018-10-28: 100 mg via ORAL
  Filled 2018-10-28: qty 1

## 2018-10-28 MED ORDER — PREDNISONE 5 MG/5ML PO SOLN
40.0000 mg | Freq: Every day | ORAL | Status: DC
  Administered 2018-10-29: 40 mg
  Filled 2018-10-28: qty 40

## 2018-10-28 MED ORDER — SODIUM CHLORIDE 1 G PO TABS
1.0000 g | ORAL_TABLET | Freq: Two times a day (BID) | ORAL | Status: DC
  Administered 2018-10-28 – 2018-10-31 (×5): 1 g
  Filled 2018-10-28 (×8): qty 1

## 2018-10-28 MED ORDER — ZOLPIDEM TARTRATE 5 MG PO TABS: 10.0000 mg | ORAL_TABLET | Freq: Once | ORAL | Status: DC

## 2018-10-28 MED ORDER — SODIUM PHOSPHATES 45 MMOLE/15ML IV SOLN
30.0000 mmol | Freq: Once | INTRAVENOUS | Status: AC
  Administered 2018-10-28: 30 mmol via INTRAVENOUS
  Filled 2018-10-28: qty 10

## 2018-10-28 MED ORDER — AMOXICILLIN-POT CLAVULANATE 875-125 MG PO TABS
1.0000 | ORAL_TABLET | Freq: Two times a day (BID) | ORAL | Status: DC
  Administered 2018-10-28: 1 via ORAL
  Filled 2018-10-28: qty 1

## 2018-10-28 MED ORDER — ADULT MULTIVITAMIN LIQUID CH
15.0000 mL | Freq: Every day | ORAL | Status: DC
  Administered 2018-10-28: 15 mL via ORAL
  Filled 2018-10-28: qty 15

## 2018-10-28 MED ORDER — MAGNESIUM SULFATE 2 GM/50ML IV SOLN
2.0000 g | Freq: Once | INTRAVENOUS | Status: AC
  Administered 2018-10-28: 2 g via INTRAVENOUS
  Filled 2018-10-28: qty 50

## 2018-10-28 MED ORDER — AMOXICILLIN-POT CLAVULANATE 875-125 MG PO TABS
1.0000 | ORAL_TABLET | Freq: Two times a day (BID) | ORAL | Status: DC
  Administered 2018-10-28 – 2018-10-31 (×6): 1
  Filled 2018-10-28 (×7): qty 1

## 2018-10-28 NOTE — Progress Notes (Signed)
eLink Physician-Brief Progress Note Patient Name: KJ IMBERT DOB: 08-16-1960 MRN: [id number]   Date of Service  10/28/2018  HPI/Events of Note  Hypokalemia hypophos hypomag   eICU Interventions  Potassium Phos Mag  replaced     Intervention Category Intermediate Interventions: Electrolyte abnormality - evaluation and management  Raseel Jans 10/28/2018, 5:41 AM

## 2018-10-28 NOTE — Progress Notes (Signed)
CCM Progress Note 10/28/18 CC: mouth dry  S: no events overnight, tolerated extubation well.  C/o mouth dryness.  Breathing nearing baseline.   O:  Today's Vitals   10/28/18 0308 10/28/18 0400 10/28/18 0500 10/28/18 0600  BP:  108/65 111/61 114/60  Pulse:  83 83 86  Resp:  12 12 13   Temp:      TempSrc:      SpO2: 96% 93% 94% 95%  Weight:   51.6 kg   Height:      PainSc:       Body mass index is 17.82 kg/m. GEN: cachetic man in NAD HEENT: MM dry, raspy voice CV: RRR, ext warm PULM: clear, no accessory muscle use GI: soft, +BS, PEG in place EXT: muscle wasting, no edema NEURO: weak but moves all 4 ext to command PSYCH: AOx3, fair insight SKIN: scarring around neck suspected radiation damage  A/P: # Acute on chronic hypoxemic and hypercarbic respiratory failure secondary to aspiration pneumonitis +/- AECOPD.  Improved.  Was eating at home which is likely culprit. # Severe protein calorie malnutrition present on admission, various electrolytes disturbances due to this # Septic shock due to aspiration event # Dysphagia s/p PEG # Chronic hyponatremia # Underlying O2 dependent COPD # Persistent mild lactic acidosis, unclear why trending this # Demand-mismatch troponin leak, NTD with this # On home methadone, not c/o any pain, will leave this alone unless c/o pain  - Doing well after extubation - Progressive mobility - 5 days prednisone, 5 days augmentin, duonebs - SLP, PT, OT consults appreciated; curious if he needs SNF - Replete various electrolyte deficiencies - Continue TF - Wean O2 - Fine for floor, appreciate [facility] taking over care - Will update wife  Erskine Emery MD

## 2018-10-28 NOTE — Evaluation (Signed)
Physical Therapy Evaluation Patient Details Name: Tony Berry MRN: [id number] DOB: 1960/04/29 Today's Date: 10/28/2018   History of Present Illness  58 y.o. male admitted on 10/26/18 for acute on chronic hypoxemia and hypercarbic respiratory failure secondary to aspiration pneumonitis in the setting of COPD, severe protein calorie malnutrition, electrolyte disturbances, septic shock, hyponatremia, who required intubation from 8/4-10/27/18.  Pt with other significant PMH of PEG tube, HTN, esophageal CA, Back surgery, R foot surgery, R leg surgery.   Clinical Impression  Pt was able to transfer OOB to [facility] and then chair with one person assist.  He has a scrape from a fall on his back and is generally weak, unsteady, and deconditioned.  He has some cognitive deficits as well.  He desaturates on 3 L O2 [place] during limited mobility to 87%.   PT to follow acutely for deficits listed below.      Follow Up Recommendations Home health PT    Equipment Recommendations  Rolling walker with 5" wheels    Recommendations for Other Services OT consult     Precautions / Restrictions Precautions Precautions: Fall;Other (comment) Precaution Comments: reports h/o falls, monitor O2 Restrictions Weight Bearing Restrictions: No      Mobility  Bed Mobility Overal bed mobility: Needs Assistance Bed Mobility: Supine to Sit     Supine to sit: Min assist     General bed mobility comments: Min hand held assist to come to sitting EOB.   Transfers Overall transfer level: Needs assistance Equipment used: 1 person hand held assist Transfers: Sit to/from [facility] Sit to Stand: Min assist Stand pivot transfers: Min assist       General transfer comment: Min assist to come to stand and transfer from bed to [facility] to recliner chair over shakey legs.                  Balance Overall balance assessment: Needs assistance Sitting-balance support: Feet supported;No upper extremity  supported Sitting balance-Leahy Scale: Fair     Standing balance support: Bilateral upper extremity supported Standing balance-Leahy Scale: Poor Standing balance comment: needs external support in standing.                              Pertinent Vitals/Pain Pain Assessment: Faces Faces Pain Scale: Hurts little more Pain Location: generalized Pain Descriptors / Indicators: Grimacing;Guarding Pain Intervention(s): Limited activity within patient's tolerance;Monitored during session;Repositioned    Home Living Family/patient expects to be discharged to:: Private residence Living Arrangements: Spouse/significant other Available Help at Discharge: Family;Available 24 hours/day Type of Home: Mobile home Home Access: Stairs to enter Entrance Stairs-Rails: Can reach both;Right;Left Entrance Stairs-Number of Steps: 4 Home Layout: One level Home Equipment: Walker - 2 wheels;Cane - single point;Walker - 4 wheels;Shower seat;Bedside commode Additional Comments: uses home O2    Prior Function Level of Independence: Independent         Comments: utilizes cane some     Hand Dominance   Dominant Hand: Right    Extremity/Trunk Assessment   Upper Extremity Assessment Upper Extremity Assessment: Generalized weakness    Lower Extremity Assessment Lower Extremity Assessment: Generalized weakness       Communication   Communication: (low tone voice, mumbles)  Cognition Arousal/Alertness: Awake/alert Behavior During Therapy: Flat affect Overall Cognitive Status: Impaired/Different from baseline Area of Impairment: Orientation;Memory                 Orientation Level: Disoriented to;Time  Memory: Decreased short-term memory         General Comments: Had some difficulty with orientation questions.        General Comments General comments (skin integrity, edema, etc.): Pt desaturated to 87% on 3 L O2  during mobility.         Assessment/Plan     PT Assessment Patient needs continued PT services  PT Problem List Decreased strength;Decreased activity tolerance;Decreased balance;Decreased mobility;Decreased cognition;Decreased knowledge of use of DME;Decreased safety awareness;Decreased knowledge of precautions;Cardiopulmonary status limiting activity;Pain;Decreased skin integrity       PT Treatment Interventions DME instruction;Gait training;Stair training;Functional mobility training;Therapeutic exercise;Therapeutic activities;Balance training;Patient/family education    PT Goals (Current goals can be found in the Care Plan section)  Acute Rehab PT Goals Patient Stated Goal: to go home, drink water PT Goal Formulation: With patient Time For Goal Achievement: 11/11/18 Potential to Achieve Goals: Good    Frequency Min 3X/week           AM-PAC PT "6 Clicks" Mobility  Outcome Measure Help needed turning from your back to your side while in a flat bed without using bedrails?: A Little Help needed moving from lying on your back to sitting on the side of a flat bed without using bedrails?: A Little Help needed moving to and from a bed to a chair (including a wheelchair)?: A Little Help needed standing up from a chair using your arms (e.g., wheelchair or bedside chair)?: A Little Help needed to walk in hospital room?: A Lot Help needed climbing 3-5 steps with a railing? : A Lot 6 Click Score: 16    End of Session Equipment Utilized During Treatment: Oxygen Activity Tolerance: Patient limited by fatigue Patient left: in chair;with call bell/phone within reach;with chair alarm set   PT Visit Diagnosis: Muscle weakness (generalized) (M62.81);History of falling (Z91.81);Difficulty in walking, not elsewhere classified (R26.2)    Time: 4356-8616 PT Time Calculation (min) (ACUTE ONLY): 18 min   Charges:      Wells Guiles B. Travin Marik, PT, DPT  Acute Rehabilitation [phone] pager [phone]) [phone] office  @ [facility]: [phone]   PT Evaluation $PT Eval Moderate Complexity: 1 Mod     10/28/2018, 9:14 PM

## 2018-10-28 NOTE — Progress Notes (Signed)
Patient ID: Tony Berry, male   DOB: [birth year], 58 y.o.   MRN: [id number]  This NP visited patient at the bedside as a follow up to  yesterday's [place].  Patient is OOB to the chair and is much more alert and engaged with me today.  We discussed his current medical situation.  Discussed specifically compliance with p.o. intake as it relates to his significant dysphasia secondary to neck cancer and radiation.  Patient's voice is very hoarse however this is his baseline.  Patient is high risk for decompensation 2/2 to underlying medical conditions.  I spoke to his wife by telephone.  We did discuss his frailty and high risk for decompensation.  She verbalizes her frustration with her husband due to the fact that he is noncompliant with p.o. intake and many times will eat and drink things that are not recommended.  Emotional support offered.  We did however also discuss how hard it is for her husband be "perfectly" compliant in his difficult medical situation.  Recommend OP Palliative services on discharge.  Discussed with patient the importance of continued conversation with family/wife and the medical providers regarding overall plan of care and treatment options,  ensuring decisions are within the context of the patients values and GOCs.  Patient remains a full code however he is on the fence about placing DNR/DNI for himself.  He will continue to process and make decisions along the way  Questions and concerns addressed   Discussed with bedside RN  Total time spent on the unit was 35 minutes  Greater than 50% of the time was spent in counseling and coordination of care  Wadie Lessen NP  Palliative Medicine Team Team Phone # [phone] Pager [phone]

## 2018-10-29 ENCOUNTER — Encounter (HOSPITAL_COMMUNITY): Payer: Self-pay

## 2018-10-29 ENCOUNTER — Other Ambulatory Visit: Payer: Self-pay

## 2018-10-29 DIAGNOSIS — Z9289 Personal history of other medical treatment: Secondary | ICD-10-CM

## 2018-10-29 DIAGNOSIS — T17908S Unspecified foreign body in respiratory tract, part unspecified causing other injury, sequela: Secondary | ICD-10-CM

## 2018-10-29 DIAGNOSIS — R1310 Dysphagia, unspecified: Secondary | ICD-10-CM

## 2018-10-29 DIAGNOSIS — Z0189 Encounter for other specified special examinations: Secondary | ICD-10-CM

## 2018-10-29 LAB — BASIC METABOLIC PANEL
Anion gap: 9 (ref 5–15)
BUN: 13 mg/dL (ref 6–20)
CO2: 36 mmol/L — ABNORMAL HIGH (ref 22–32)
Calcium: 8.4 mg/dL — ABNORMAL LOW (ref 8.9–10.3)
Chloride: 90 mmol/L — ABNORMAL LOW (ref 98–111)
Creatinine, Ser: 0.37 mg/dL — ABNORMAL LOW (ref 0.61–1.24)
GFR calc Af Amer: >60 mL/min (ref >60)
GFR calc non Af Amer: >60 mL/min (ref >60)
Glucose, Bld: 113 mg/dL — ABNORMAL HIGH (ref 70–99)
Potassium: 3.4 mmol/L — ABNORMAL LOW (ref 3.5–5.1)
Sodium: 135 mmol/L (ref 135–145)

## 2018-10-29 LAB — CBC
HCT: 34.2 % — ABNORMAL LOW (ref 39.0–52.0)
Hemoglobin: 10.5 g/dL — ABNORMAL LOW (ref 13.0–17.0)
MCH: 28.2 pg (ref 26.0–34.0)
MCHC: 30.7 g/dL (ref 30.0–36.0)
MCV: 91.9 fL (ref 80.0–100.0)
Platelets: 201 10*3/uL (ref 150–400)
RBC: 3.72 MIL/uL — ABNORMAL LOW (ref 4.22–5.81)
RDW: 14 % (ref 11.5–15.5)
WBC: 18.2 10*3/uL — ABNORMAL HIGH (ref 4.0–10.5)
nRBC: 0 % (ref 0.0–0.2)

## 2018-10-29 LAB — GLUCOSE, CAPILLARY
Glucose-Capillary: 101 mg/dL — ABNORMAL HIGH (ref 70–99)
Glucose-Capillary: 101 mg/dL — ABNORMAL HIGH (ref 70–99)
Glucose-Capillary: 121 mg/dL — ABNORMAL HIGH (ref 70–99)
Glucose-Capillary: 137 mg/dL — ABNORMAL HIGH (ref 70–99)

## 2018-10-29 LAB — CULTURE, RESPIRATORY W GRAM STAIN

## 2018-10-29 MED ORDER — ALBUTEROL SULFATE (2.5 MG/3ML) 0.083% IN NEBU
2.5000 mg | INHALATION_SOLUTION | Freq: Four times a day (QID) | RESPIRATORY_TRACT | Status: DC | PRN
  Filled 2018-10-29: qty 3

## 2018-10-29 MED ORDER — ALBUTEROL SULFATE (2.5 MG/3ML) 0.083% IN NEBU
2.5000 mg | INHALATION_SOLUTION | RESPIRATORY_TRACT | Status: DC | PRN
  Administered 2018-10-29 – 2018-10-30 (×3): 2.5 mg via RESPIRATORY_TRACT
  Filled 2018-10-29 (×2): qty 3

## 2018-10-29 MED ORDER — METHADONE HCL 5 MG PO TABS: 50.0000 mg | ORAL_TABLET | Freq: Every day | ORAL | Status: DC

## 2018-10-29 MED ORDER — METHADONE HCL 10 MG PO TABS
115.0000 mg | ORAL_TABLET | Freq: Every day | ORAL | Status: DC
  Administered 2018-10-29 – 2018-10-30 (×2): 115 mg
  Filled 2018-10-29 (×2): qty 12

## 2018-10-29 MED ORDER — POTASSIUM CHLORIDE 20 MEQ/15ML (10%) PO SOLN
40.0000 meq | Freq: Once | ORAL | Status: AC
  Administered 2018-10-29: 40 meq
  Filled 2018-10-29: qty 30

## 2018-10-29 MED ORDER — IPRATROPIUM-ALBUTEROL 0.5-2.5 (3) MG/3ML IN SOLN
3.0000 mL | Freq: Two times a day (BID) | RESPIRATORY_TRACT | Status: DC
  Administered 2018-10-30 – 2018-10-31 (×3): 3 mL via RESPIRATORY_TRACT
  Filled 2018-10-29 (×3): qty 3

## 2018-10-29 MED ORDER — ENOXAPARIN SODIUM 40 MG/0.4ML ~~LOC~~ SOLN
40.0000 mg | SUBCUTANEOUS | Status: DC
  Administered 2018-10-29 – 2018-10-31 (×2): 40 mg via SUBCUTANEOUS
  Filled 2018-10-29 (×3): qty 0.4

## 2018-10-29 MED ORDER — METHADONE HCL 5 MG PO TABS
50.0000 mg | ORAL_TABLET | Freq: Every day | ORAL | Status: DC
  Filled 2018-10-29: qty 10

## 2018-10-29 NOTE — Evaluation (Signed)
Occupational Therapy Evaluation Patient Details Name: Tony Berry MRN: [id number] DOB: Feb 12, 1961 Today's Date: 10/29/2018    History of Present Illness 58 y.o. male admitted on 10/26/18 for acute on chronic hypoxemia and hypercarbic respiratory failure secondary to aspiration pneumonitis in the setting of COPD, severe protein calorie malnutrition, electrolyte disturbances, septic shock, hyponatremia, who required intubation from 8/4-10/27/18.  Pt with other significant PMH of PEG tube, HTN, esophageal CA, Back surgery, R foot surgery, R leg surgery.    Clinical Impression   Pt admitted with above diagnoses, decreased cardiopulmonary status and generalized weakness limiting ability to engage in BADL at desired level of ind. PTA pt reports ind with occasional cane use, except wife helps with medicines and bills. At time of evaluation, pt using RW at min guard assist to complete household level of functional mobility. He has equipment at baseline and uses a PEG at baseline as well. VSS at 4L [place]. Pt with some higher level cognitive deficits (suspect baseline), but also difficulty communicating due to low tone voice. Brother present during session and supportive. Noted pt bleeding through gown, RN staff notified and pt cleaned up (had scrape on abdomen). At this time recommend no further follow up OT, but will continue to follow while acute per POC listed below.     Follow Up Recommendations  No OT follow up;Supervision - Intermittent    Equipment Recommendations  None recommended by OT    Recommendations for Other Services       Precautions / Restrictions Precautions Precautions: Fall;Other (comment) Precaution Comments: reports h/o falls, monitor O2, Peg tube at baseline Restrictions Weight Bearing Restrictions: No      Mobility Bed Mobility               General bed mobility comments: sitting up at EOB  Transfers Overall transfer level: Needs assistance Equipment used: Rolling  walker (2 wheeled) Transfers: Sit to/from Stand Sit to Stand: Min guard         General transfer comment: min guard for safety    Balance Overall balance assessment: Needs assistance Sitting-balance support: Feet supported;No upper extremity supported Sitting balance-Leahy Scale: Fair     Standing balance support: Bilateral upper extremity supported Standing balance-Leahy Scale: Poor Standing balance comment: reliant on external support                           ADL either performed or assessed with clinical judgement   ADL Overall ADL's : Needs assistance/impaired Eating/Feeding: NPO Eating/Feeding Details (indicate cue type and reason): PEG at baseline; NPO currently Grooming: Min guard;Standing   Upper Body Bathing: Set up;Sitting   Lower Body Bathing: Sit to/from stand;Sitting/lateral leans;Min guard   Upper Body Dressing : Sitting;Set up Upper Body Dressing Details (indicate cue type and reason): don/doff gown Lower Body Dressing: Min guard;Sit to/from stand Lower Body Dressing Details (indicate cue type and reason): to fix socks while seated EOB Toilet Transfer: Min guard;BSC;Regular Toilet;RW   Toileting- Clothing Manipulation and Hygiene: Supervision/safety;Sit to/from stand       Functional mobility during ADLs: Min guard;Rolling walker General ADL Comments: decreased activity tolerance     Vision Patient Visual Report: No change from baseline       Perception     Praxis      Pertinent Vitals/Pain Pain Assessment: No/denies pain     Hand Dominance     Extremity/Trunk Assessment Upper Extremity Assessment Upper Extremity Assessment: Generalized weakness   Lower Extremity  Assessment Lower Extremity Assessment: Defer to PT evaluation       Communication Communication Communication: Other (comment)(low tone voice)   Cognition Arousal/Alertness: Awake/alert Behavior During Therapy: Flat affect Overall Cognitive Status:  Impaired/Different from baseline Area of Impairment: Orientation;Memory                 Orientation Level: Disoriented to;Time   Memory: Decreased short-term memory         General Comments: some difficulty with higher level cog   General Comments       Exercises     Shoulder Instructions      Home Living Family/patient expects to be discharged to:: Private residence Living Arrangements: Spouse/significant other Available Help at Discharge: Family;Available 24 hours/day Type of Home: Mobile home Home Access: Stairs to enter Entrance Stairs-Number of Steps: 4 Entrance Stairs-Rails: Can reach both;Right;Left Home Layout: One level     Bathroom Shower/Tub: Teacher, early years/pre: Standard     Home Equipment: Environmental consultant - 2 wheels;Cane - single point;Walker - 4 wheels;Shower seat;Bedside commode   Additional Comments: uses home O2, PEG at home      Prior Functioning/Environment Level of Independence: Independent        Comments: utilizes cane some        OT Problem List: Decreased strength;Decreased range of motion;Decreased activity tolerance;Cardiopulmonary status limiting activity;Impaired balance (sitting and/or standing)      OT Treatment/Interventions: Self-care/ADL training;Therapeutic exercise;Neuromuscular education;DME and/or AE instruction;Therapeutic activities;Balance training;Patient/family education;Energy conservation    OT Goals(Current goals can be found in the care plan section) Acute Rehab OT Goals Patient Stated Goal: be able to drink something again OT Goal Formulation: With patient Time For Goal Achievement: 11/12/18 Potential to Achieve Goals: Good  OT Frequency: Min 2X/week   Barriers to D/C:            Co-evaluation              AM-PAC OT "6 Clicks" Daily Activity     Outcome Measure Help from another person eating meals?: None Help from another person taking care of personal grooming?: A Little Help  from another person toileting, which includes using toliet, bedpan, or urinal?: A Little Help from another person bathing (including washing, rinsing, drying)?: A Little Help from another person to put on and taking off regular upper body clothing?: None Help from another person to put on and taking off regular lower body clothing?: None 6 Click Score: 21   End of Session Equipment Utilized During Treatment: Oxygen;Rolling walker;Gait belt Nurse Communication: Mobility status  Activity Tolerance: Patient tolerated treatment well Patient left: in bed;with call bell/phone within reach;with family/visitor present  OT Visit Diagnosis: Muscle weakness (generalized) (M62.81);Unsteadiness on feet (R26.81)                Time: 5929-2446 OT Time Calculation (min): 32 min Charges:  OT General Charges $OT Visit: 1 Visit OT Evaluation $OT Eval Moderate Complexity: 1 Mod OT Treatments $Self Care/Home Management : 8-22 mins  Zenovia Jarred, MSOT, OTR/L Behavioral Health OT/ Acute Relief OT [facility] Office: [phone]   Zenovia Jarred 10/29/2018, 4:37 PM

## 2018-10-29 NOTE — Progress Notes (Signed)
PROGRESS NOTE    Tony Berry  FTD:[id number] DOB: [birth year] DOA: 10/26/2018 PCP: Cher Nakai, MD    Brief Narrative:  58 year old male who presented with respiratory failure.  He was intubated in [facility] and transferred for further care.  He does have significant past medical history for head and neck cancer status post radiation, dysphasia status post PEG, severe protein calorie malnutrition and chronic hypoxic respiratory failure using 3 L per nasal cannula at baseline.  By the time of transfer patient was sedated with Versed and fentanyl, he was on norepinephrine infusion.  PRVC 60% FiO2, 5 of PEEP rate 22, tidal volume 520.  Temperature 38.3, pulse rate 83, respiratory 22, blood pressure 112/64, oxygen saturation 100% on 60% FiO2.  Was sedated, cachectic, diffuse rhonchi and wheezing bilaterally, heart S1-S2 present, tachycardic, abdomen soft, PEG in place, no lower extremity edema. Sodium 135, potassium 4.7, chloride 89, bicarb 37, glucose 158, BUN 18, creatinine 0.69, high sensitivity troponin I 32, white count 15.8, hemoglobin 12.7, hematocrit 43.3, platelets 226, procalcitonin 1.43, urine analysis negative for infection.  Chest radiograph had hyperinflation, left lower lobe alveolar infiltrate.  Patient was admitted to the intensive care unit with a working diagnosis of acute on chronic hypoxic respiratory failure due to left lower lobe aspiration pneumonia, complicated by septic shock.  The patient was treated with broad-spectrum antibiotic therapy and systemic steroids.  Likely COPD has exacerbated related to aspiration.  His electrolytes were corrected, and he was placed on tube feeds.  He was successfully liberated from mechanical ventilation August 5.  He has been transferred to [facility] August 7.  Assessment & Plan:   Principal Problem:   Acute on chronic respiratory failure with hypoxia and hypercapnia (HCC) Active Problems:   Protein-calorie malnutrition, severe  Dysphagia   DNR (do not resuscitate) discussion   Pressure injury of skin   Aspiration into airway   History of head and neck cancer   Palliative care by specialist   1. Acute on chronic hypoxic respiratory failure/ acute cod exacerbation. Patient now off the ventilator for the last 48 H. Oxygenating 92 to 96% on 3 LPM per nasal cannula. Dyspnea continue to improve but not yet back to baseline. Will resume as needed albuterol and will continue q 6 H duoneb. Discontinue systemic steroids. Physical therapy evaluation and out of bed with meals.  2. Septic shock due to left lower lobe aspiration pneumonia (present on admission). Wbc now up to 18 from 17.8, patient has been afebrile, and cultures have been no growth. Blood pressure 147/72 mmHg. Will continue antibiotic therapy with Augmentin, continue aspiration precautions, and NPO.   3. Hypokalemia and hypomagnesemia. K today is 3,4, with renal function stable 0.37, will continue K correction with Kcl 40 meq. Mg 1,8. Patient is not diabetic will discontinue insulin sliding scale, his glucose has been stable 138, 132, 101, 101, 121 (capillary).   3. Severe calorie protein malnutrition. Will continue tube feedings, follow with nutrition recommendations.   4. Hx of head and neck cancer. 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 Continue neuro checks per unit protocol.  5. Sacrum pressure ulcer/ not able to stage/ present on admission/ Continue with local wound care.   DVT prophylaxis: enoxaparin   Code Status:  full Family Communication: no family at the bedside  Disposition Plan/ discharge barriers: transfer to medical unit.   Body mass index is 17.68 kg/m. Malnutrition Type:  Nutrition Problem: Severe Malnutrition  Etiology: chronic illness   Malnutrition Characteristics:  Signs/Symptoms: severe fat depletion, severe muscle depletion   Nutrition Interventions:  Interventions:  Tube feeding  RN Pressure Injury Documentation: Pressure Injury 10/26/18 Sacrum Deep Tissue Injury - Purple or maroon localized area of discolored intact skin or blood-filled blister due to damage of underlying soft tissue from pressure and/or shear. (Active)  10/26/18 1900  Location: Sacrum  Location Orientation:   Staging: Deep Tissue Injury - Purple or maroon localized area of discolored intact skin or blood-filled blister due to damage of underlying soft tissue from pressure and/or shear.  Wound Description (Comments):   Present on Admission: Yes     Consultants:     Procedures:     Antimicrobials:   Augmentin     Subjective: Patient is feeling better but not yet back to baseline, continue to have dyspnea and generalized weakness. Pain is generalized. No nausea or vomiting, and tolerating po well.   Objective: Vitals:   10/29/18 0401 10/29/18 0500 10/29/18 0600 10/29/18 0717  BP:   (!) 151/72 (!) 151/72  Pulse:  79 77 80  Resp:  15 17 16   Temp: 98.4 F (36.9 C)     TempSrc: Oral     SpO2:  91% 95% 96%  Weight:  51.2 kg    Height:        Intake/Output Summary (Last 24 hours) at 10/29/2018 0827 Last data filed at 10/29/2018 0600 Gross per 24 hour  Intake 396.72 ml  Output 550 ml  Net -153.28 ml   Filed Weights   10/27/18 0500 10/28/18 0500 10/29/18 0500  Weight: 52.2 kg 51.6 kg 51.2 kg    Examination:   General: deconditioned and ill looking appearing  Neurology: Awake and alert, non focal  E ENT: positive pallor, no icterus, oral mucosa moist Cardiovascular: No JVD. S1-S2 present, rhythmic, no gallops, rubs, or murmurs. Trace lower extremity edema. Pulmonary:  Positive breath sounds bilaterally, decreased air movement, no wheezing, or rhonchi, scattered rales predominantly on the lower left. Gastrointestinal. Abdomen with no organomegaly, non tender, no rebound or guarding/ PEG tube in place.  Skin. No rashes Musculoskeletal: no joint deformities      Data Reviewed: I have personally reviewed following labs and imaging studies  CBC: Recent Labs  Lab 10/26/18 1947 10/26/18 2006 10/27/18 0321 10/28/18 0226 10/29/18 0402  WBC 15.8*  --  15.2* 17.8* 18.2*  HGB 12.7* 12.6* 10.1* 10.3* 10.5*  HCT 43.3 37.0* 33.8* 34.0* 34.2*  MCV 97.1  --  95.8 93.2 91.9  PLT 226  --  200 214 623   Basic Metabolic Panel: Recent Labs  Lab 10/26/18 1947 10/26/18 2006 10/27/18 0321 10/27/18 0958 10/27/18 1648 10/27/18 2100 10/28/18 0226 10/28/18 1651 10/29/18 0402  NA 135 136 137  --   --   --  135  --  135  K 4.7 3.8 4.1  --   --   --  3.3*  --  3.4*  CL 89*  --  92*  --   --   --  91*  --  90*  CO2 37*  --  34*  --   --   --  36*  --  36*  GLUCOSE 158*  --  104*  --   --   --  136*  --  113*  BUN 18  --  16  --   --   --  15  --  13  CREATININE 0.69  --  0.54*  --   --   --  0.38*  --  0.37*  CALCIUM 8.6*  --  8.1*  --   --   --  8.3*  --  8.4*  MG 1.4*  --  1.6* 1.9 1.7  --  1.7 1.8  --   PHOS 1.1*  --  1.3* 3.1 3.8 3.1 2.3* 3.5  --    GFR: Estimated Creatinine Clearance: 73.8 mL/min (A) (by C-G formula based on SCr of 0.37 mg/dL (L)). Liver Function Tests: Recent Labs  Lab 10/26/18 1947  AST 58*  ALT 24  ALKPHOS 82  BILITOT 0.5  PROT 6.0*  ALBUMIN 2.2*  2.2*   No results for input(s): LIPASE, AMYLASE in the last 168 hours. No results for input(s): AMMONIA in the last 168 hours. Coagulation Profile: No results for input(s): INR, PROTIME in the last 168 hours. Cardiac Enzymes: No results for input(s): CKTOTAL, CKMB, CKMBINDEX, TROPONINI in the last 168 hours. BNP (last 3 results) No results for input(s): PROBNP in the last 8760 hours. HbA1C: Recent Labs    10/27/18 0958  HGBA1C 5.5   CBG: Recent Labs  Lab 10/28/18 1214 10/28/18 1623 10/28/18 1920 10/29/18 0029 10/29/18 0400  GLUCAP 136* 138* 132* 101* 101*   Lipid Profile: No results for input(s): CHOL, HDL, LDLCALC, TRIG, CHOLHDL, LDLDIRECT in the last 72  hours. Thyroid Function Tests: No results for input(s): TSH, T4TOTAL, FREET4, T3FREE, THYROIDAB in the last 72 hours. Anemia Panel: No results for input(s): VITAMINB12, FOLATE, FERRITIN, TIBC, IRON, RETICCTPCT in the last 72 hours.    Radiology Studies: I have reviewed all of the imaging during this hospital visit personally     Scheduled Meds: . amoxicillin-clavulanate  1 tablet Per Tube Q12H  . aspirin  81 mg Per Tube Daily  . chlorhexidine  15 mL Mouth Rinse BID  . clopidogrel  75 mg Per Tube Daily  . feeding supplement (PRO-STAT SUGAR FREE 64)  30 mL Per Tube Daily  . heparin  5,000 Units Subcutaneous Q8H  . insulin aspart  0-9 Units Subcutaneous Q4H  . ipratropium-albuterol  3 mL Nebulization Q6H  . mouth rinse  15 mL Mouth Rinse q12n4p  . multivitamin  15 mL Per Tube Daily  . polyethylene glycol  17 g Per Tube Daily  . predniSONE  40 mg Per Tube Q breakfast  . senna  1 tablet Per Tube Daily  . sodium chloride  1 g Per Tube BID WC  . thiamine  100 mg Per Tube Daily  . zolpidem  10 mg Oral Once   Continuous Infusions: . famotidine (PEPCID) IV Stopped (10/28/18 2335)  . feeding supplement (JEVITY 1.5 CAL/FIBER) 1,000 mL (10/29/18 0550)     LOS: 3 days        Gregoire Bennis Gerome Apley, MD

## 2018-10-30 LAB — CBC WITH DIFFERENTIAL/PLATELET
Abs Immature Granulocytes: 0.04 10*3/uL (ref 0.00–0.07)
Basophils Absolute: 0 10*3/uL (ref 0.0–0.1)
Basophils Relative: 0 %
Eosinophils Absolute: 0 10*3/uL (ref 0.0–0.5)
Eosinophils Relative: 0 %
HCT: 32.9 % — ABNORMAL LOW (ref 39.0–52.0)
Hemoglobin: 10.4 g/dL — ABNORMAL LOW (ref 13.0–17.0)
Immature Granulocytes: 0 %
Lymphocytes Relative: 5 %
Lymphs Abs: 0.5 10*3/uL — ABNORMAL LOW (ref 0.7–4.0)
MCH: 28.2 pg (ref 26.0–34.0)
MCHC: 31.6 g/dL (ref 30.0–36.0)
MCV: 89.2 fL (ref 80.0–100.0)
Monocytes Absolute: 0.6 10*3/uL (ref 0.1–1.0)
Monocytes Relative: 6 %
Neutro Abs: 9.6 10*3/uL — ABNORMAL HIGH (ref 1.7–7.7)
Neutrophils Relative %: 89 %
Platelets: 187 10*3/uL (ref 150–400)
RBC: 3.69 MIL/uL — ABNORMAL LOW (ref 4.22–5.81)
RDW: 13.9 % (ref 11.5–15.5)
WBC: 10.9 10*3/uL — ABNORMAL HIGH (ref 4.0–10.5)
nRBC: 0 % (ref 0.0–0.2)

## 2018-10-30 LAB — BASIC METABOLIC PANEL
Anion gap: 10 (ref 5–15)
BUN: 11 mg/dL (ref 6–20)
CO2: 34 mmol/L — ABNORMAL HIGH (ref 22–32)
Calcium: 8.6 mg/dL — ABNORMAL LOW (ref 8.9–10.3)
Chloride: 92 mmol/L — ABNORMAL LOW (ref 98–111)
Creatinine, Ser: 0.33 mg/dL — ABNORMAL LOW (ref 0.61–1.24)
GFR calc Af Amer: >60 mL/min (ref >60)
GFR calc non Af Amer: >60 mL/min (ref >60)
Glucose, Bld: 120 mg/dL — ABNORMAL HIGH (ref 70–99)
Potassium: 3.5 mmol/L (ref 3.5–5.1)
Sodium: 136 mmol/L (ref 135–145)

## 2018-10-30 MED ORDER — KETOROLAC TROMETHAMINE 30 MG/ML IJ SOLN
15.0000 mg | Freq: Once | INTRAMUSCULAR | Status: AC
  Administered 2018-10-30: 15 mg via INTRAVENOUS
  Filled 2018-10-30: qty 1

## 2018-10-30 NOTE — Evaluation (Signed)
Clinical/Bedside Swallow Evaluation Patient Details  Name: Tony Berry MRN: [id number] Date of Birth: 1960-09-03  Today's Date: 10/30/2018 Time: SLP Start Time (ACUTE ONLY): 0930 SLP Stop Time (ACUTE ONLY): 1000 SLP Time Calculation (min) (ACUTE ONLY): 30 min  Past Medical History:  Past Medical History:  Diagnosis Date  . Esophageal cancer ([place])   . Head and neck cancer ([place])   . Hypertension    Past Surgical History:  Past Surgical History:  Procedure Laterality Date  . BACK SURGERY  2008, 2011  . FOOT SURGERY Right   . IR GASTROSTOMY TUBE MOD SED  04/08/2018  . LEG SURGERY Right    HPI:  Patient is a 58 y.o. male with PMH: head and neck cancer s/p radiation, chronic respiratory failure on 3L O2 at baseline, HTN, hyponatremia, severe protient calorie malnutrition, PVD, dysphagia, recent admission last month for aspiration PNA but without intubation, has a PEG but continues to eat and drink and reportedly not following SLP's recommendations. Most recent MBS was completed 03/12/18 as inpatient with results: chronic pharyngeal dysphagia  with consistent penetration of all bolus consistencies into larynx; portion of penetrates passed through vocal folds and into trachea resulting in sensed aspiration. As nectar thick liquids were tolerated slightly better than thin liquids, recommendation was for Dysphagia 2 (fine chop), nectar thick liquids, with additional recommendation for ENT repeat laryngoscopy.   Assessment / Plan / Recommendation Clinical Impression  Patient presents with severe, chronic dysphagia which is likely worsened somewhat since most recent MBS in 02/2018. He was recent admission to hospital with aspiration PNA. During this bedside swallow assessment, patient exhbiited both delayed and immediate cough with ice chips, decreased hyolaryngeal elevation, movement with ice chips and nectar thick liquids. He expectorated white-yellow secretions which he said he has been doing at  home as well. SLP reviewed December MBS with patient and at that time, recommendation was for nectar thick liquids and dys 2 (fine chop) solids but that he had consistent penetration leading to aspiration with all tested boluses (SLP who performed MBS reported that nectar thick liquids were "marginally" better than thin liquids). Patient did not want to try thin liquids as he is afraid of aspiration and did not want to have any PO's that "would put me back in the hospital". SLP explained and educated patient regarding the chronic nature of his dysphagia, recommendation for NPO except ice chips after oral care. Patient has been evaluated by Palliative care, but at this point he does not wish to go this route, and so SLP is not able to make any comfort-feeding descisions. He does have PEG tube and per chart review, his wife reports he gets most of his nutrition from PEG, but that he does not follow previous SLP's recommendations for nectar thick liquids. (If patient were to go comfort-care, SLP would recommend Nectar thick liquids and Dys 2 solids and allow water/ice outside of meals after oral care. SLP Visit Diagnosis: Dysphagia, oropharyngeal phase (R13.12)    Aspiration Risk  Severe aspiration risk    Diet Recommendation NPO;Ice chips PRN after oral care   Medication Administration: Via alternative means    Other  Recommendations Recommended Consults: Consider ENT evaluation   Follow up Recommendations Outpatient SLP      Frequency and Duration min 1 x/week  1 week       Prognosis Prognosis for Safe Diet Advancement: Guarded Barriers to Reach Goals: Time post onset;Severity of deficits      Swallow Study   General  Date of Onset: 10/26/18 HPI: Patient is a 58 y.o. male with PMH: head and neck cancer s/p radiation, chronic respiratory failure on 3L O2 at baseline, HTN, hyponatremia, severe protient calorie malnutrition, PVD, dysphagia, recent admission last month for aspiration PNA but  without intubation, has a PEG but continues to eat and drink and reportedly not following SLP's recommendations. Most recent MBS was completed 03/12/18 as inpatient with results: chronic pharyngeal dysphagia  with consistent penetration of all bolus consistencies into larynx; portion of penetrates passed through vocal folds and into trachea resulting in sensed aspiration. As nectar thick liquids were tolerated slightly better than thin liquids, recommendation was for Dysphagia 2 (fine chop), nectar thick liquids, with additional recommendation for ENT repeat laryngoscopy. Type of Study: Bedside Swallow Evaluation Previous Swallow Assessment: MBS 02/2018 revealed chronic dysphagai, recommending Dys 2, nectar liquids with aspiration risk Diet Prior to this Study: NPO Temperature Spikes Noted: No Respiratory Status: Nasal cannula Behavior/Cognition: Alert;Cooperative Oral Cavity Assessment: Within Functional Limits Oral Care Completed by SLP: Yes Oral Cavity - Dentition: Poor condition Vision: Functional for self-feeding Self-Feeding Abilities: Able to feed self Patient Positioning: Upright in bed Baseline Vocal Quality: Hoarse;Low vocal intensity Volitional Cough: Weak Volitional Swallow: Able to elicit    Oral/Motor/Sensory Function Overall Oral Motor/Sensory Function: Within functional limits   Ice Chips Ice chips: Impaired Presentation: Self Fed Pharyngeal Phase Impairments: Suspected delayed Swallow;Decreased hyoid-laryngeal movement;Cough - Delayed;Cough - Immediate   Thin Liquid Thin Liquid: Not tested Other Comments: Patient declined thin liquids as he is afraid of aspirating    Nectar Thick Nectar Thick Liquid: Impaired Presentation: Cup;Self Fed Pharyngeal Phase Impairments: Decreased hyoid-laryngeal movement   Honey Thick     Puree Puree: Not tested   Solid     Solid: Not tested      Tony Berry 10/30/2018,1:22 PM   Sonia Baller, MA, CCC-SLP Speech  Therapy [facility] Acute Rehab Pager: [phone]

## 2018-10-30 NOTE — Progress Notes (Signed)
Received in report from day shift RN that patient had been more confused and lethargic during the day after given morning meds. Upon assessing patient this evening, patient appeared more lethargic than previous night, was falling asleep during conversations. Patients brother at bedside, per brother he stated that this has been happening at home a lot more lately as well after he takes his morning meds, the brother was concerned that his methadone dose was too high since the dose he is receiving was the dose he was prescribed when he weighed twice as much as he does now. Jeannette Corpus, NP notified of patient status and brother's concerns. The patients brother stated that he will be here tomorrow morning at 10am during visiting hours and would like to speak with the doctors about his concerns prior to patient being discharged home.

## 2018-10-30 NOTE — Progress Notes (Signed)
PROGRESS NOTE    Tony Berry  UMP:[id number] DOB: 01-01-1961 DOA: 10/26/2018 PCP: Cher Nakai, MD    Brief Narrative:  57/M with history of head and neck cancer, status post radiation, dysphagia with a PEG tube, poor compliance with dietary restrictions, severe protein calorie malnutrition, chronic COPD, chronic respiratory failure on 3 L home O2 at baseline. -Transferred from [facility] intubated with acute respiratory failure on 8/4 secondary to aspiration pneumonia, septic shock, requiring Neo-Synephrine briefly. -Extubated 8/5,  transferred to [facility] August 7.  Assessment & Plan:   1. Acute on chronic hypoxic respiratory failure -Secondary to sepsis, aspiration pneumonia -Clinically improved -Blood cultures are negative -Treated with IV Zosyn initially, subsequently transitioned to Augmentin, will complete 7-day therapy -Also treated for COPD exacerbation while in ICU, currently off steroids now -Continue PT OT, SLP evaluation, discharge planning  2. Septic shock due to aspiration pneumonia -Resolved see above, required Neo-Synephrine on admission  3.  History of head and neck cancer  -Status post XRT, followed by an oncologist in [place]  -Chronic dysphagia  -Follow-up with oncology   4. hypokalemia and hypomagnesemia. -Replaced  5. Severe calorie protein malnutrition.  -Will continue tube feedings, follow with nutrition recommendations.   6.  Dysphagia -Secondary to head and neck CA, XRT -Supposed to be n.p.o. with tube feeds only, poor compliance with diet at home -Discussed with SLP, last MBS reviewed, high aspiration risk noted, patient wants aggressive scope of treatment including intubation if he aspirates again, hence recommended strict n.p.o. with ice chips after mouth care only and to continue tube feeds  7.  Chronic pain -Continue methadone  8.. Sacrum pressure ulcer/ not able to stage/ present on admission/ Continue with local wound care.   DVT  prophylaxis: enoxaparin   Code Status:  full Family Communication: no family at the bedside  Disposition Plan/ discharge barriers: Home tomorrow if stable  Body mass index is 19.41 kg/m. Malnutrition Type:  Nutrition Problem: Severe Malnutrition Etiology: chronic illness   Malnutrition Characteristics:  Signs/Symptoms: severe fat depletion, severe muscle depletion   Nutrition Interventions:  Interventions: Tube feeding  RN Pressure Injury Documentation: Pressure Injury 10/26/18 Sacrum Deep Tissue Injury - Purple or maroon localized area of discolored intact skin or blood-filled blister due to damage of underlying soft tissue from pressure and/or shear. (Active)  10/26/18 1900  Location: Sacrum  Location Orientation:   Staging: Deep Tissue Injury - Purple or maroon localized area of discolored intact skin or blood-filled blister due to damage of underlying soft tissue from pressure and/or shear.  Wound Description (Comments):   Present on Admission: Yes     Consultants:     Procedures:     Antimicrobials:   Augmentin     Subjective: -Feels better overall, breathing significantly improved, some intermittent cough -Curious about his diet  Objective: Vitals:   10/29/18 2036 10/29/18 2300 10/30/18 0300 10/30/18 0912  BP:  139/69    Pulse: 86 74  82  Resp: 16 20  20   Temp:  98.1 F (36.7 C)    TempSrc:  Oral    SpO2: 95% 98%  97%  Weight:   56.2 kg   Height:        Intake/Output Summary (Last 24 hours) at 10/30/2018 1009 Last data filed at 10/30/2018 0658 Gross per 24 hour  Intake 1257.5 ml  Output 751 ml  Net 506.5 ml   Filed Weights   10/28/18 0500 10/29/18 0500 10/30/18 0300  Weight: 51.6 kg 51.2 kg 56.2  kg    Examination:   Gen: Chronically ill cachectic male, sitting up in bed, AAO x3, no distress   HEENT: PERRLA, Neck supple, no JVD, scarring on his neck Lungs: Poor air movement bilaterally, few conducted upper airway sounds CVS: RRR,No  Gallops,Rubs or new Murmurs Abd: soft, Non tender, non distended, BS present Extremities: No edema Skin: no new rashes Musculoskeletal: no joint deformities     Data Reviewed: I have personally reviewed following labs and imaging studies  CBC: Recent Labs  Lab 10/26/18 1947 10/26/18 2006 10/27/18 0321 10/28/18 0226 10/29/18 0402 10/30/18 0408  WBC 15.8*  --  15.2* 17.8* 18.2* 10.9*  NEUTROABS  --   --   --   --   --  9.6*  HGB 12.7* 12.6* 10.1* 10.3* 10.5* 10.4*  HCT 43.3 37.0* 33.8* 34.0* 34.2* 32.9*  MCV 97.1  --  95.8 93.2 91.9 89.2  PLT 226  --  200 214 201 956   Basic Metabolic Panel: Recent Labs  Lab 10/26/18 1947 10/26/18 2006 10/27/18 0321 10/27/18 0958 10/27/18 1648 10/27/18 2100 10/28/18 0226 10/28/18 1651 10/29/18 0402 10/30/18 0408  NA 135 136 137  --   --   --  135  --  135 136  K 4.7 3.8 4.1  --   --   --  3.3*  --  3.4* 3.5  CL 89*  --  92*  --   --   --  91*  --  90* 92*  CO2 37*  --  34*  --   --   --  36*  --  36* 34*  GLUCOSE 158*  --  104*  --   --   --  136*  --  113* 120*  BUN 18  --  16  --   --   --  15  --  13 11  CREATININE 0.69  --  0.54*  --   --   --  0.38*  --  0.37* 0.33*  CALCIUM 8.6*  --  8.1*  --   --   --  8.3*  --  8.4* 8.6*  MG 1.4*  --  1.6* 1.9 1.7  --  1.7 1.8  --   --   PHOS 1.1*  --  1.3* 3.1 3.8 3.1 2.3* 3.5  --   --    GFR: Estimated Creatinine Clearance: 81 mL/min (A) (by C-G formula based on SCr of 0.33 mg/dL (L)). Liver Function Tests: Recent Labs  Lab 10/26/18 1947  AST 58*  ALT 24  ALKPHOS 82  BILITOT 0.5  PROT 6.0*  ALBUMIN 2.2*  2.2*   No results for input(s): LIPASE, AMYLASE in the last 168 hours. No results for input(s): AMMONIA in the last 168 hours. Coagulation Profile: No results for input(s): INR, PROTIME in the last 168 hours. Cardiac Enzymes: No results for input(s): CKTOTAL, CKMB, CKMBINDEX, TROPONINI in the last 168 hours. BNP (last 3 results) No results for input(s): PROBNP in the  last 8760 hours. HbA1C: No results for input(s): HGBA1C in the last 72 hours. CBG: Recent Labs  Lab 10/28/18 1920 10/29/18 0029 10/29/18 0400 10/29/18 0733 10/29/18 1131  GLUCAP 132* 101* 101* 121* 137*   Lipid Profile: No results for input(s): CHOL, HDL, LDLCALC, TRIG, CHOLHDL, LDLDIRECT in the last 72 hours. Thyroid Function Tests: No results for input(s): TSH, T4TOTAL, FREET4, T3FREE, THYROIDAB in the last 72 hours. Anemia Panel: No results for input(s): VITAMINB12, FOLATE, FERRITIN, TIBC, IRON, RETICCTPCT in  the last 72 hours.    Radiology Studies: I have reviewed all of the imaging during this hospital visit personally     Scheduled Meds: . amoxicillin-clavulanate  1 tablet Per Tube Q12H  . aspirin  81 mg Per Tube Daily  . chlorhexidine  15 mL Mouth Rinse BID  . clopidogrel  75 mg Per Tube Daily  . enoxaparin (LOVENOX) injection  40 mg Subcutaneous Q24H  . feeding supplement (PRO-STAT SUGAR FREE 64)  30 mL Per Tube Daily  . ipratropium-albuterol  3 mL Nebulization BID  . mouth rinse  15 mL Mouth Rinse q12n4p  . methadone  115 mg Per Tube Daily  . multivitamin  15 mL Per Tube Daily  . polyethylene glycol  17 g Per Tube Daily  . senna  1 tablet Per Tube Daily  . sodium chloride  1 g Per Tube BID WC  . thiamine  100 mg Per Tube Daily  . zolpidem  10 mg Oral Once   Continuous Infusions: . famotidine (PEPCID) IV Stopped (10/29/18 2255)  . feeding supplement (JEVITY 1.5 CAL/FIBER) 1,000 mL (10/30/18 0505)     LOS: 4 days        Domenic Polite, MD

## 2018-10-31 LAB — CBC
HCT: 34.2 % — ABNORMAL LOW (ref 39.0–52.0)
Hemoglobin: 10.4 g/dL — ABNORMAL LOW (ref 13.0–17.0)
MCH: 28.2 pg (ref 26.0–34.0)
MCHC: 30.4 g/dL (ref 30.0–36.0)
MCV: 92.7 fL (ref 80.0–100.0)
Platelets: 216 10*3/uL (ref 150–400)
RBC: 3.69 MIL/uL — ABNORMAL LOW (ref 4.22–5.81)
RDW: 14 % (ref 11.5–15.5)
WBC: 10.5 10*3/uL (ref 4.0–10.5)
nRBC: 0 % (ref 0.0–0.2)

## 2018-10-31 LAB — BASIC METABOLIC PANEL
Anion gap: 8 (ref 5–15)
BUN: 15 mg/dL (ref 6–20)
CO2: 38 mmol/L — ABNORMAL HIGH (ref 22–32)
Calcium: 8.7 mg/dL — ABNORMAL LOW (ref 8.9–10.3)
Chloride: 91 mmol/L — ABNORMAL LOW (ref 98–111)
Creatinine, Ser: 0.31 mg/dL — ABNORMAL LOW (ref 0.61–1.24)
GFR calc Af Amer: >60 mL/min (ref >60)
GFR calc non Af Amer: >60 mL/min (ref >60)
Glucose, Bld: 102 mg/dL — ABNORMAL HIGH (ref 70–99)
Potassium: 4 mmol/L (ref 3.5–5.1)
Sodium: 137 mmol/L (ref 135–145)

## 2018-10-31 LAB — CULTURE, BLOOD (ROUTINE X 2)
Culture: NO GROWTH
Culture: NO GROWTH

## 2018-10-31 MED ORDER — METHADONE HCL 40 MG PO TBSO: 80.0000 mg | ORAL_TABLET | Freq: Every day | ORAL | 0 refills | Status: AC

## 2018-10-31 MED ORDER — AMOXICILLIN-POT CLAVULANATE 875-125 MG PO TABS: 1.0000 | ORAL_TABLET | Freq: Two times a day (BID) | ORAL | 0 refills | Status: AC

## 2018-10-31 MED ORDER — METHADONE HCL 10 MG PO TABS
80.0000 mg | ORAL_TABLET | Freq: Every day | ORAL | Status: DC
  Administered 2018-10-31: 80 mg
  Filled 2018-10-31: qty 8

## 2018-10-31 NOTE — Progress Notes (Signed)
SATURATION QUALIFICATIONS: (This note is used to comply with regulatory documentation for home oxygen)  Patient Saturations on Room Air at Rest = 70%  Patient Saturations on Room Air while Ambulating = 50%  Patient Saturations on 3 Liters of oxygen while Ambulating = 90%  Please briefly explain why patient needs home oxygen: Chronic Respiratory Failure related to cancer and recurrent aspiration pneumonia

## 2018-10-31 NOTE — Discharge Summary (Signed)
Physician Discharge Summary  Tony Berry DXI:[id number] DOB: 1961/01/23 DOA: 10/26/2018  PCP: Cher Nakai, MD  Admit date: 10/26/2018 Discharge date: 10/31/2018  Time spent: 35 minutes  Recommendations for Outpatient Follow-up:  1. PCP in 1 week 2. Methadone dose decreased to 80 mg daily due to oversedation 3. Oncology in 1 month   Discharge Diagnoses:  Principal Problem:   Acute on chronic respiratory failure with hypoxia and hypercapnia (HCC) Active Problems:   Protein-calorie malnutrition, severe   Dysphagia   DNR (do not resuscitate) discussion   Pressure injury of skin   Aspiration into airway   History of head and neck cancer   Palliative care by specialist   Discharge Condition: Stable  Diet recommendation: N.p.o., ice chips after oral care only, tube feeds and pills by PEG  Filed Weights   10/29/18 0500 10/30/18 0300 10/31/18 0300  Weight: 51.2 kg 56.2 kg 53.1 kg    History of present illness:  57/M with history of head and neck cancer, status post radiation, dysphagia with a PEG tube, poor compliance with dietary restrictions, severe protein calorie malnutrition, chronic COPD, chronic respiratory failure on 3 L home O2 at baseline. -Transferred from [facility] intubated with acute respiratory failure on 8/4 secondary to aspiration pneumonia, septic   Hospital Course:   1. Acute on chronic hypoxic respiratory failure -Secondary to sepsis, aspiration pneumonia -Clinically improved -Blood cultures are negative -Treated with IV Zosyn initially, subsequently transitioned to Augmentin, to complete 7-day therapy -Also treated for COPD exacerbation while in ICU, currently off steroids now -Speech therapist saw patient again recommended strict n.p.o., ice chips after oral care only if patient wants aggressive full scope of treatment. -In addition patient was also noted to be oversedated from his methadone which could have been contributing to his aspiration hence  the dose was decreased from 115 mg to 80 mg daily  2. Septic shock due to aspiration pneumonia -Resolved see above, required Neo-Synephrine on admission  3.  History of head and neck cancer  -Status post XRT, followed by an oncologist in [place]  -Chronic dysphagia  -Follow-up with oncology   4. hypokalemia and hypomagnesemia. -Replaced  5. Severe calorie protein malnutrition.  -Will continue tube feedings, follow with nutrition recommendations.   6.  Dysphagia -Secondary to head and neck CA, XRT -Supposed to be n.p.o. with tube feeds only, poor compliance with diet at home -Discussed with SLP, last MBS reviewed, high aspiration risk noted, patient wants aggressive scope of treatment including intubation if he aspirates again, hence recommended strict n.p.o. with ice chips after mouth care only and to continue tube feeds -Methadone decreased dose due to oversedation see discussion above  7.  Chronic pain -Continue methadone, dose decreased from 115 mg to 80 mg daily due to oversedation, lethargy after a.m. dose  8.. Sacrum pressure ulcer/ not able to stage/ present on admission/ Continue with local wound care.   Discharge Exam: Vitals:   10/31/18 0909 10/31/18 1000  BP:    Pulse:    Resp:    Temp:    SpO2: 93% 93%    General: AAOx3, frail cachectic Cardiovascular: S1-S2/regular rate rhythm Respiratory: Poor air movement bilaterally  Discharge Instructions   Discharge Instructions    Discharge instructions   Complete by: As directed    Nothing by mouth, ice chips after good oral care only, meds and nutrition by TUBE ONLY   Increase activity slowly   Complete by: As directed      Allergies as  of 10/31/2018   No Known Allergies     Medication List    TAKE these medications   albuterol (2.5 MG/3ML) 0.083% nebulizer solution Commonly known as: PROVENTIL Take 2.5 mg by nebulization 2 (two) times daily.   albuterol 108 (90 Base) MCG/ACT inhaler Commonly  known as: VENTOLIN HFA Inhale 2 puffs into the lungs every 6 (six) hours as needed for wheezing or shortness of breath.   amoxicillin-clavulanate 875-125 MG tablet Commonly known as: AUGMENTIN Take 1 tablet by mouth 2 (two) times daily for 3 days.   aspirin 81 MG chewable tablet Chew 1 tablet (81 mg total) by mouth daily. Please do not take for five days before your gastrostomy tube placement procedure What changed: additional instructions   clopidogrel 75 MG tablet Commonly known as: PLAVIX Take 1 tablet (75 mg total) by mouth daily. Please do not take for the 5 days before your gastrostomy tube procedure   DUONEB IN Inhale 3 mLs into the lungs 2 (two) times daily.   feeding supplement (JEVITY 1.2 CAL) Liqd Place 1,000 mLs into feeding tube continuous. Six a day   fluticasone 50 MCG/ACT nasal spray Commonly known as: FLONASE Place 1 spray into both nostrils daily.   furosemide 20 MG tablet Commonly known as: LASIX Take 1 tablet (20 mg total) by mouth 2 (two) times daily.   methadone 40 MG disintegrating tablet Commonly known as: METHADOSE Take 2 tablets (80 mg total) by mouth daily for 1 dose. What changed:   medication strength  how much to take   nicotine 21 mg/24hr patch Commonly known as: NICODERM CQ - dosed in mg/24 hours Place 21 mg onto the skin daily.   ondansetron 4 MG tablet Commonly known as: ZOFRAN Take 4 mg by mouth every 8 (eight) hours as needed for nausea or vomiting.   polyethylene glycol 17 g packet Commonly known as: MIRALAX / GLYCOLAX Take 17 g by mouth daily.   Resource ThickenUp Clear Powd Take 120 g by mouth as needed.   senna 8.6 MG Tabs tablet Commonly known as: SENOKOT Take 1 tablet by mouth daily.   sodium chloride 1 g tablet Take 1 tablet (1 g total) by mouth 2 (two) times daily with a meal.   Stiolto Respimat 2.5-2.5 MCG/ACT Aers Generic drug: Tiotropium Bromide-Olodaterol Inhale 2 puffs into the lungs daily.      No  Known Allergies Follow-up Information    Cher Nakai, MD. Schedule an appointment as soon as possible for a visit in 1 week(s).   Specialty: Internal Medicine Contact information: [place] [address] [phone]            The results of significant diagnostics from this hospitalization (including imaging, microbiology, ancillary and laboratory) are listed below for reference.    Significant Diagnostic Studies: Dg Chest Port 1 View  Result Date: 10/27/2018 CLINICAL DATA:  Tubes and lines, respiratory failure EXAM: PORTABLE CHEST 1 VIEW COMPARISON:  October 23, 2018 FINDINGS: ET tube and NG tube are stable in position. The ET tube is 3.5 cm above the level of the carina. Again noted are bibasilar patchy airspace opacities, left greater than right. There appears to be slight interval improvement in the left lower lobe opacity. There is a trace left pleural effusion. The cardiomediastinal silhouette is unchanged. Aortic knob calcifications. No acute osseous findings. IMPRESSION: 1. Stable lines and tubes. 2. Bibasilar patchy airspace opacities, left greater than right. There appears to be slight interval improvement in  the left lower lobe opacity which could be due to multifocal infectious etiology. Electronically Signed   By: Prudencio Pair M.D.   On: 10/27/2018 08:11   Dg Chest Port 1 View  Result Date: 10/26/2018 CLINICAL DATA:  Endotracheal tube placement EXAM: PORTABLE CHEST 1 VIEW COMPARISON:  October 26, 2018 at 1:23 p.m. FINDINGS: The heart size is stable. The endotracheal tube terminates approximately 3.6 cm above the carina. The enteric tube appears to extend below the left hemidiaphragm. There is a persistent left lower lobe airspace consolidation. There are a few opacities at the right lung base is well. There is no pneumothorax. There may be a small left-sided pleural effusion. IMPRESSION: 1. Lines and tubes as above. The enteric tube has been repositioned and  appears to extend below the left hemidiaphragm. 2. Extensive left lower lobe consolidation. Persistent opacities at the right lung base also concerning for consolidation. Electronically Signed   By: Constance Holster M.D.   On: 10/26/2018 19:44   Dg Abd Portable 1v  Result Date: 10/26/2018 CLINICAL DATA:  OG tube placement EXAM: PORTABLE ABDOMEN - 1 VIEW COMPARISON:  August 4th 2020 FINDINGS: The enteric tube projects over the gastric body. A gastrostomy tube projects over the mid abdomen. There is a large amount of stool throughout the colon. The bowel gas pattern is nonspecific and nonobstructive. The liver appears enlarged. IMPRESSION: Enteric tube projects over the gastric body. A PEG tube projects over the gastric body as well. Electronically Signed   By: Constance Holster M.D.   On: 10/26/2018 19:46    Microbiology: Recent Results (from the past 240 hour(s))  Culture, respiratory (non-expectorated)     Status: None   Collection Time: 10/26/18  8:36 PM   Specimen: Tracheal Aspirate; Respiratory  Result Value Ref Range Status   Specimen Description TRACHEAL ASPIRATE  Final   Special Requests NONE  Final   Gram Stain   Final    ABUNDANT WBC PRESENT, PREDOMINANTLY PMN FEW GRAM NEGATIVE RODS RARE GRAM POSITIVE RODS RARE BUDDING YEAST SEEN RARE GRAM POSITIVE COCCI IN CLUSTERS Performed at [facility], [place] [address], [place], [place] [address]    Culture MODERATE SERRATIA MARCESCENS  Final   Report Status 10/29/2018 FINAL  Final   Organism ID, Bacteria SERRATIA MARCESCENS  Final      Susceptibility   Serratia marcescens - MIC*    CEFAZOLIN >=64 RESISTANT Resistant     CEFEPIME <=1 SENSITIVE Sensitive     CEFTAZIDIME <=1 SENSITIVE Sensitive     CEFTRIAXONE <=1 SENSITIVE Sensitive     CIPROFLOXACIN <=0.25 SENSITIVE Sensitive     GENTAMICIN <=1 SENSITIVE Sensitive     TRIMETH/SULFA <=20 SENSITIVE Sensitive     * MODERATE SERRATIA MARCESCENS  MRSA PCR Screening     Status:  Abnormal   Collection Time: 10/26/18  9:00 PM  Result Value Ref Range Status   MRSA by PCR (A) NEGATIVE Final    INVALID, UNABLE TO DETERMINE THE PRESENCE OF TARGET DUE TO SPECIMEN INTEGRITY. RECOLLECTION REQUESTED.    Comment: CALLED TO PHILL[place],C RN [phone] 10/27/2018 MITCHELL,L        The GeneXpert MRSA Assay (FDA approved for NASAL specimens only), is one component of a comprehensive MRSA colonization surveillance program. It is not intended to diagnose MRSA infection nor to guide or monitor treatment for MRSA infections. Performed at [facility], [place] [address], [place], [place] [address]   Culture, blood (routine x 2)     Status:  None   Collection Time: 10/26/18  9:12 PM   Specimen: BLOOD LEFT FOREARM  Result Value Ref Range Status   Specimen Description BLOOD LEFT FOREARM  Final   Special Requests   Final    BOTTLES DRAWN AEROBIC ONLY Blood Culture results may not be optimal due to an inadequate volume of blood received in culture bottles   Culture   Final    NO GROWTH 5 DAYS Performed at [facility], [place] [address], [place], [place] [address]    Report Status 10/31/2018 FINAL  Final  Culture, blood (routine x 2)     Status: None   Collection Time: 10/26/18  9:20 PM   Specimen: BLOOD LEFT HAND  Result Value Ref Range Status   Specimen Description BLOOD LEFT HAND  Final   Special Requests   Final    BOTTLES DRAWN AEROBIC ONLY Blood Culture results may not be optimal due to an inadequate volume of blood received in culture bottles   Culture   Final    NO GROWTH 5 DAYS Performed at [facility], [place] [address] [place], [place] [address]    Report Status 10/31/2018 FINAL  Final  MRSA PCR Screening     Status: Abnormal   Collection Time: 10/27/18  1:02 AM  Result Value Ref Range Status   MRSA by PCR (A) NEGATIVE Final    INVALID, UNABLE TO DETERMINE THE PRESENCE OF TARGET DUE TO SPECIMEN INTEGRITY. RECOLLECTION REQUESTED.    Comment: CALLED TO  [place] RN [phone] 10/27/2018 TO RECOLLECT FOR NASAL CULTURE MITCHELL,L        The GeneXpert MRSA Assay (FDA approved for NASAL specimens only), is one component of a comprehensive MRSA colonization surveillance program. It is not intended to diagnose MRSA infection nor to guide or monitor treatment for MRSA infections. Performed at [facility], [place] [address], [place], [place] [address]   MRSA PCR Screening     Status: None   Collection Time: 10/27/18  5:25 PM   Specimen: Nasal Mucosa; Nasopharyngeal  Result Value Ref Range Status   MRSA by PCR NEGATIVE NEGATIVE Final    Comment:        The GeneXpert MRSA Assay (FDA approved for NASAL specimens only), is one component of a comprehensive MRSA colonization surveillance program. It is not intended to diagnose MRSA infection nor to guide or monitor treatment for MRSA infections. Performed at [facility], [place] [address], [place],  [address]      Labs: Basic Metabolic Panel: Recent Labs  Lab 10/27/18 0321 10/27/18 8250 10/27/18 1648 10/27/18 2100 10/28/18 0226 10/28/18 1651 10/29/18 0402 10/30/18 0408 10/31/18 0623  NA 137  --   --   --  135  --  135 136 137  K 4.1  --   --   --  3.3*  --  3.4* 3.5 4.0  CL 92*  --   --   --  91*  --  90* 92* 91*  CO2 34*  --   --   --  36*  --  36* 34* 38*  GLUCOSE 104*  --   --   --  136*  --  113* 120* 102*  BUN 16  --   --   --  15  --  13 11 15   CREATININE 0.54*  --   --   --  0.38*  --  0.37* 0.33* 0.31*  CALCIUM 8.1*  --   --   --  8.3*  --  8.4* 8.6* 8.7*  MG 1.6* 1.9 1.7  --  1.7 1.8  --   --   --   PHOS 1.3* 3.1 3.8 3.1 2.3* 3.5  --   --   --    Liver Function Tests: Recent Labs  Lab 10/26/18 1947  AST 58*  ALT 24  ALKPHOS 82  BILITOT 0.5  PROT 6.0*  ALBUMIN 2.2*  2.2*   No results for input(s): LIPASE, AMYLASE in the last 168 hours. No results for input(s): AMMONIA in the last 168 hours. CBC: Recent Labs  Lab 10/27/18 0321 10/28/18 0226  10/29/18 0402 10/30/18 0408 10/31/18 0623  WBC 15.2* 17.8* 18.2* 10.9* 10.5  NEUTROABS  --   --   --  9.6*  --   HGB 10.1* 10.3* 10.5* 10.4* 10.4*  HCT 33.8* 34.0* 34.2* 32.9* 34.2*  MCV 95.8 93.2 91.9 89.2 92.7  PLT 200 214 201 187 216   Cardiac Enzymes: No results for input(s): CKTOTAL, CKMB, CKMBINDEX, TROPONINI in the last 168 hours. BNP: BNP (last 3 results) Recent Labs    03/10/18 1442  BNP 693.7*    ProBNP (last 3 results) No results for input(s): PROBNP in the last 8760 hours.  CBG: Recent Labs  Lab 10/28/18 1920 10/29/18 0029 10/29/18 0400 10/29/18 0733 10/29/18 1131  GLUCAP 132* 101* 101* 121* 137*       Signed:  Domenic Polite MD.  [facility] 10/31/2018, 2:47 PM

## 2018-11-03 DIAGNOSIS — Z9289 Personal history of other medical treatment: Secondary | ICD-10-CM

## 2018-11-03 DIAGNOSIS — Z0189 Encounter for other specified special examinations: Secondary | ICD-10-CM

## 2018-11-04 ENCOUNTER — Other Ambulatory Visit: Payer: Self-pay | Admitting: Pulmonary Disease

## 2018-11-08 ENCOUNTER — Ambulatory Visit: Payer: Medicaid Other | Admitting: Cardiology

## 2018-11-09 ENCOUNTER — Ambulatory Visit: Payer: Medicaid Other | Admitting: Cardiology

## 2018-11-13 ENCOUNTER — Encounter (HOSPITAL_COMMUNITY): Payer: Self-pay | Admitting: *Deleted

## 2018-11-13 ENCOUNTER — Other Ambulatory Visit: Payer: Self-pay

## 2018-11-13 ENCOUNTER — Other Ambulatory Visit (HOSPITAL_COMMUNITY)
Admission: RE | Admit: 2018-11-13 | Discharge: 2018-11-13 | Disposition: A | Discharge status: Home / Self Care | Payer: Medicaid Other | Source: Ambulatory Visit | Attending: Pulmonary Disease | Admitting: Pulmonary Disease

## 2018-11-13 ENCOUNTER — Emergency Department (HOSPITAL_COMMUNITY): Payer: Medicaid Other

## 2018-11-13 ENCOUNTER — Emergency Department (HOSPITAL_COMMUNITY)
Admission: EM | Admit: 2018-11-13 | Discharge: 2018-11-13 | Disposition: A | Discharge status: Home / Self Care | Payer: Medicaid Other | Attending: Emergency Medicine | Admitting: Emergency Medicine

## 2018-11-13 DIAGNOSIS — J441 Chronic obstructive pulmonary disease with (acute) exacerbation: Secondary | ICD-10-CM | POA: Insufficient documentation

## 2018-11-13 DIAGNOSIS — R0602 Shortness of breath: Secondary | ICD-10-CM | POA: Diagnosis not present

## 2018-11-13 DIAGNOSIS — Z8589 Personal history of malignant neoplasm of other organs and systems: Secondary | ICD-10-CM | POA: Diagnosis not present

## 2018-11-13 DIAGNOSIS — Z7982 Long term (current) use of aspirin: Secondary | ICD-10-CM | POA: Diagnosis not present

## 2018-11-13 DIAGNOSIS — R69 Illness, unspecified: Secondary | ICD-10-CM | POA: Insufficient documentation

## 2018-11-13 DIAGNOSIS — R079 Chest pain, unspecified: Secondary | ICD-10-CM | POA: Insufficient documentation

## 2018-11-13 DIAGNOSIS — Z20828 Contact with and (suspected) exposure to other viral communicable diseases: Secondary | ICD-10-CM | POA: Insufficient documentation

## 2018-11-13 DIAGNOSIS — I10 Essential (primary) hypertension: Secondary | ICD-10-CM | POA: Insufficient documentation

## 2018-11-13 DIAGNOSIS — Z87891 Personal history of nicotine dependence: Secondary | ICD-10-CM | POA: Diagnosis not present

## 2018-11-13 DIAGNOSIS — R05 Cough: Secondary | ICD-10-CM | POA: Diagnosis present

## 2018-11-13 DIAGNOSIS — Z79899 Other long term (current) drug therapy: Secondary | ICD-10-CM | POA: Diagnosis not present

## 2018-11-13 DIAGNOSIS — Z01812 Encounter for preprocedural laboratory examination: Secondary | ICD-10-CM | POA: Diagnosis not present

## 2018-11-13 DIAGNOSIS — Z9981 Dependence on supplemental oxygen: Secondary | ICD-10-CM | POA: Insufficient documentation

## 2018-11-13 DIAGNOSIS — Z8501 Personal history of malignant neoplasm of esophagus: Secondary | ICD-10-CM | POA: Insufficient documentation

## 2018-11-13 LAB — CBC WITH DIFFERENTIAL/PLATELET
Abs Immature Granulocytes: 0.05 10*3/uL (ref 0.00–0.07)
Basophils Absolute: 0 10*3/uL (ref 0.0–0.1)
Basophils Relative: 0 %
Eosinophils Absolute: 0.1 10*3/uL (ref 0.0–0.5)
Eosinophils Relative: 1 %
HCT: 36.3 % — ABNORMAL LOW (ref 39.0–52.0)
Hemoglobin: 10.6 g/dL — ABNORMAL LOW (ref 13.0–17.0)
Immature Granulocytes: 1 %
Lymphocytes Relative: 9 %
Lymphs Abs: 0.7 10*3/uL (ref 0.7–4.0)
MCH: 27.8 pg (ref 26.0–34.0)
MCHC: 29.2 g/dL — ABNORMAL LOW (ref 30.0–36.0)
MCV: 95.3 fL (ref 80.0–100.0)
Monocytes Absolute: 0.8 10*3/uL (ref 0.1–1.0)
Monocytes Relative: 10 %
Neutro Abs: 6.4 10*3/uL (ref 1.7–7.7)
Neutrophils Relative %: 79 %
Platelets: 310 10*3/uL (ref 150–400)
RBC: 3.81 MIL/uL — ABNORMAL LOW (ref 4.22–5.81)
RDW: 13.2 % (ref 11.5–15.5)
WBC: 8.1 10*3/uL (ref 4.0–10.5)
nRBC: 0 % (ref 0.0–0.2)

## 2018-11-13 LAB — BASIC METABOLIC PANEL
Anion gap: 9 (ref 5–15)
BUN: 5 mg/dL — ABNORMAL LOW (ref 6–20)
CO2: 43 mmol/L — ABNORMAL HIGH (ref 22–32)
Calcium: 9 mg/dL (ref 8.9–10.3)
Chloride: 81 mmol/L — ABNORMAL LOW (ref 98–111)
Creatinine, Ser: 0.43 mg/dL — ABNORMAL LOW (ref 0.61–1.24)
GFR calc Af Amer: >60 mL/min (ref >60)
GFR calc non Af Amer: >60 mL/min (ref >60)
Glucose, Bld: 86 mg/dL (ref 70–99)
Potassium: 4.1 mmol/L (ref 3.5–5.1)
Sodium: 133 mmol/L — ABNORMAL LOW (ref 135–145)

## 2018-11-13 MED ORDER — AMOXICILLIN-POT CLAVULANATE 875-125 MG PO TABS: 1.0000 | ORAL_TABLET | Freq: Two times a day (BID) | ORAL | 0 refills | Status: DC

## 2018-11-13 MED ORDER — AMOXICILLIN-POT CLAVULANATE 875-125 MG PO TABS
1.0000 | ORAL_TABLET | Freq: Once | ORAL | Status: AC
  Administered 2018-11-13: 1 via ORAL
  Filled 2018-11-13: qty 1

## 2018-11-13 MED ORDER — ALBUTEROL SULFATE HFA 108 (90 BASE) MCG/ACT IN AERS
4.0000 | INHALATION_SPRAY | Freq: Once | RESPIRATORY_TRACT | Status: AC
  Administered 2018-11-13: 4 via RESPIRATORY_TRACT
  Filled 2018-11-13: qty 6.7

## 2018-11-13 MED ORDER — IPRATROPIUM BROMIDE HFA 17 MCG/ACT IN AERS
2.0000 | INHALATION_SPRAY | Freq: Once | RESPIRATORY_TRACT | Status: AC
  Administered 2018-11-13: 23:00:00 2 via RESPIRATORY_TRACT
  Filled 2018-11-13: qty 12.9

## 2018-11-13 NOTE — ED Triage Notes (Signed)
Pt reports chest pain, abdominal pain, cough (green sputum) and SOB since Thursday. Denies fevers. Chronic O2 [place] @ 3 liters.

## 2018-11-13 NOTE — ED Provider Notes (Signed)
[facility] EMERGENCY DEPARTMENT Provider Note   CSN: [id number] Arrival date & time: 11/13/18  1932     History   Chief Complaint Chief Complaint  Patient presents with  . Chest Pain    HPI Tony Berry is a 58 y.o. male.     HPI Patient reports for the past 2 days he feels like he has had increasing cough and chest pain with some green sputum production.  He reports he is also felt more short of breath.  He however has not used his inhaler today.  He reports sometimes he is increasing his oxygen from his usual 3 L to 4 L.  Patient reports he also feels like he has some discomfort around his feeding tube site. Past Medical History:  Diagnosis Date  . Esophageal cancer ([place])   . Head and neck cancer ([place])   . Hypertension     Patient Active Problem List   Diagnosis Date Noted  . Encounter for imaging study to confirm orogastric (OG) tube placement   . History of ETT   . Palliative care by specialist   . Acute on chronic respiratory failure with hypoxia and hypercapnia ([place]) 10/26/2018  . Pressure injury of skin 10/26/2018  . Aspiration into airway 10/26/2018  . History of head and neck cancer 10/26/2018  . Fall at home, initial encounter 09/28/2018  . Goals of care, counseling/discussion   . DNR (do not resuscitate) discussion   . Chest discomfort 05/06/2018  . Dysphagia 04/08/2018  . Aspiration pneumonia ([place]) 04/04/2018  . Acute on chronic respiratory failure ([place]) 03/15/2018  . Carditis   . Troponin level elevated 03/11/2018  . Protein-calorie malnutrition, severe 03/11/2018  . Hyponatremia 03/11/2018  . Hypophosphatemia 03/11/2018  . Multifocal pneumonia 03/10/2018    Past Surgical History:  Procedure Laterality Date  . BACK SURGERY  2008, 2011  . FOOT SURGERY Right   . IR GASTROSTOMY TUBE MOD SED  04/08/2018  . LEG SURGERY Right         Home Medications    Prior to Admission medications   Medication Sig Start Date End Date  Taking? Authorizing Provider  albuterol (PROVENTIL) (2.5 MG/3ML) 0.083% nebulizer solution Take 2.5 mg by nebulization 2 (two) times daily.    [provider]  albuterol (VENTOLIN HFA) 108 (90 Base) MCG/ACT inhaler Inhale 2 puffs into the lungs every 6 (six) hours as needed for wheezing or shortness of breath. 10/02/18   Mercy Riding, MD  aspirin 81 MG chewable tablet Chew 1 tablet (81 mg total) by mouth daily. Please do not take for five days before your gastrostomy tube placement procedure Patient taking differently: Chew 81 mg by mouth daily.  03/16/18   Asencion Noble, MD  clopidogrel (PLAVIX) 75 MG tablet Take 1 tablet (75 mg total) by mouth daily. Please do not take for the 5 days before your gastrostomy tube procedure 05/24/18   Revankar, Reita Cliche, MD  fluticasone (FLONASE) 50 MCG/ACT nasal spray Place 1 spray into both nostrils daily.    [provider]  furosemide (LASIX) 20 MG tablet Take 1 tablet (20 mg total) by mouth 2 (two) times daily. 10/01/18   Mercy Riding, MD  Ipratropium-Albuterol (DUONEB IN) Inhale 3 mLs into the lungs 2 (two) times daily.    [provider]  Maltodextrin-Xanthan Gum (RESOURCE THICKENUP CLEAR) POWD Take 120 g by mouth as needed. 03/15/18   Asencion Noble, MD  nicotine (NICODERM CQ - DOSED IN  MG/24 HOURS) 21 mg/24hr patch Place 21 mg onto the skin daily.    [provider]  Nutritional Supplements (FEEDING SUPPLEMENT, JEVITY 1.2 CAL,) LIQD Place 1,000 mLs into feeding tube continuous. Six a day    [provider]  ondansetron (ZOFRAN) 4 MG tablet Take 4 mg by mouth every 8 (eight) hours as needed for nausea or vomiting.    [provider]  polyethylene glycol (MIRALAX / GLYCOLAX) packet Take 17 g by mouth daily. 03/16/18   Asencion Noble, MD  senna (SENOKOT) 8.6 MG TABS tablet Take 1 tablet by mouth daily.    [provider]  sodium chloride 1 g tablet Take 1 tablet (1 g total) by mouth 2 (two)  times daily with a meal. 10/01/18   Mercy Riding, MD  Tiotropium Bromide-Olodaterol (STIOLTO RESPIMAT) 2.5-2.5 MCG/ACT AERS Inhale 2 puffs into the lungs daily. 10/01/18   Mercy Riding, MD    Family History Family History  Problem Relation Age of Onset  . COPD Mother   . Lung cancer Mother        on HOT   . COPD Father   . Pneumonia Father   . CAD Father        CABG   . COPD Sister   . Colon cancer Neg Hx     Social History Social History   Tobacco Use  . Smoking status: Former Smoker    Packs/day: 1.00    Years: 30.00    Pack years: 30.00    Types: Cigarettes    Start date: 03/11/1988    Quit date: 02/25/2018    Years since quitting: 0.7  . Smokeless tobacco: Former Network engineer Use Topics  . Alcohol use: Yes    Comment: 30 years ago   . Drug use: No     Allergies   Patient has no known allergies.   Review of Systems Review of Systems 10 Systems reviewed and are negative for acute change except as noted in the HPI.   Physical Exam Updated Vital Signs BP 127/75 (BP Location: Right Arm)   Pulse 76   Temp 97.7 F (36.5 C) (Oral)   Resp 19   SpO2 99%   Physical Exam Constitutional:      Comments: Patient is alert.  No acute respiratory distress.  Mental status is clear.  Oxygen saturation is 99% on patient's baseline 3 L.  Blood pressure is normal and heart rate on the monitor is in the 70s in sinus.  Eyes:     Extraocular Movements: Extraocular movements intact.  Cardiovascular:     Rate and Rhythm: Normal rate and regular rhythm.  Pulmonary:     Comments: No respiratory distress.  Patient speaking in full sentences.  Breath sounds are slightly soft but adequate airflow throughout. Abdominal:     General: There is no distension.     Palpations: Abdomen is soft.     Tenderness: There is no abdominal tenderness. There is no guarding.     Comments: PEG tube site is clean dry and intact.  It is well-healed no drainage no discharge.  Musculoskeletal:  Normal range of motion.  Skin:    General: Skin is warm and dry.  Neurological:     General: No focal deficit present.     Mental Status: He is oriented to person, place, and time.     Coordination: Coordination normal.  Psychiatric:        Mood and Affect: Mood normal.  ED Treatments / Results  Labs (all labs ordered are listed, but only abnormal results are displayed) Labs Reviewed - No data to display  EKG None  Radiology No results found.  Procedures Procedures (including critical care time)  Medications Ordered in ED Medications - No data to display   Initial Impression / Assessment and Plan / ED Course  I have reviewed the triage vital signs and the nursing notes.  Pertinent labs & imaging results that were available during my care of the patient were reviewed by me and considered in my medical decision making (see chart for details).       Patient reports some recurrence of symptoms over the past 3 days of cough with some green sputum production and shortness of breath.  He also reports some chest pain with cough.  Patient has not used his inhalers today.  Chest x-ray is  improved relative to previous but has some persistent infiltrate.  Clinically, patient appears well.  He is on his baseline oxygen with 99% saturation and no respiratory distress.  Patient does not have any tachycardia and has normal blood pressure.  He is not using inhalers regularly.  At this time I have suspicion for COPD exacerbation.  Patient has had some persistent infiltrate but this is not worse than previous.  At this time I will have him take another course of Augmentin which she was discharged on from his last admission.  He is counseled to use his inhalers every 4 hours and every 2 hours as needed.  He is advised for close follow-up with his PCP.    Final Clinical Impressions(s) / ED Diagnoses       Final diagnoses:  COPD exacerbation ([place])  Severe comorbid illness    ED  Discharge Orders    None       Charlesetta Shanks, MD 11/13/18 2304

## 2018-11-13 NOTE — ED Notes (Signed)
ED Provider at bedside. 

## 2018-11-13 NOTE — Discharge Instructions (Addendum)
1.  Start Augmentin as prescribed. 2.  Use your albuterol inhaler every 2-4 hours while you are having increased cough or shortness of breath.  You can resume your regular regimen once symptoms are improved. 3.  Follow-up with your doctor within the next 3 to 4 days.

## 2018-11-13 NOTE — ED Notes (Signed)
Pt dc'd home with all belongings, driven home by friend,

## 2018-11-14 LAB — SARS CORONAVIRUS 2 (TAT 6-24 HRS): SARS Coronavirus 2: NEGATIVE

## 2018-11-16 ENCOUNTER — Inpatient Hospital Stay (HOSPITAL_COMMUNITY): Payer: Medicaid Other

## 2018-11-16 ENCOUNTER — Inpatient Hospital Stay (HOSPITAL_COMMUNITY)
Admission: AD | Admit: 2018-11-16 | Discharge: 2018-12-13 | DRG: 004 | Disposition: A | Discharge status: Home / Self Care | Payer: Medicaid Other | Source: Other Acute Inpatient Hospital | Attending: Internal Medicine | Admitting: Internal Medicine

## 2018-11-16 ENCOUNTER — Telehealth: Payer: Self-pay | Admitting: *Deleted

## 2018-11-16 DIAGNOSIS — J9621 Acute and chronic respiratory failure with hypoxia: Secondary | ICD-10-CM | POA: Diagnosis present

## 2018-11-16 DIAGNOSIS — E43 Unspecified severe protein-calorie malnutrition: Secondary | ICD-10-CM | POA: Diagnosis present

## 2018-11-16 DIAGNOSIS — Z7982 Long term (current) use of aspirin: Secondary | ICD-10-CM

## 2018-11-16 DIAGNOSIS — Z681 Body mass index (BMI) 19 or less, adult: Secondary | ICD-10-CM

## 2018-11-16 DIAGNOSIS — I10 Essential (primary) hypertension: Secondary | ICD-10-CM | POA: Diagnosis present

## 2018-11-16 DIAGNOSIS — E162 Hypoglycemia, unspecified: Secondary | ICD-10-CM | POA: Diagnosis not present

## 2018-11-16 DIAGNOSIS — F1190 Opioid use, unspecified, uncomplicated: Secondary | ICD-10-CM | POA: Diagnosis present

## 2018-11-16 DIAGNOSIS — Z8589 Personal history of malignant neoplasm of other organs and systems: Secondary | ICD-10-CM

## 2018-11-16 DIAGNOSIS — J690 Pneumonitis due to inhalation of food and vomit: Secondary | ICD-10-CM | POA: Diagnosis present

## 2018-11-16 DIAGNOSIS — R6521 Severe sepsis with septic shock: Secondary | ICD-10-CM | POA: Diagnosis present

## 2018-11-16 DIAGNOSIS — R52 Pain, unspecified: Secondary | ICD-10-CM | POA: Diagnosis not present

## 2018-11-16 DIAGNOSIS — Z9911 Dependence on respirator [ventilator] status: Secondary | ICD-10-CM | POA: Diagnosis not present

## 2018-11-16 DIAGNOSIS — R64 Cachexia: Secondary | ICD-10-CM | POA: Diagnosis present

## 2018-11-16 DIAGNOSIS — Z66 Do not resuscitate: Secondary | ICD-10-CM | POA: Diagnosis present

## 2018-11-16 DIAGNOSIS — Z7902 Long term (current) use of antithrombotics/antiplatelets: Secondary | ICD-10-CM

## 2018-11-16 DIAGNOSIS — Z978 Presence of other specified devices: Secondary | ICD-10-CM | POA: Diagnosis not present

## 2018-11-16 DIAGNOSIS — R9431 Abnormal electrocardiogram [ECG] [EKG]: Secondary | ICD-10-CM | POA: Diagnosis present

## 2018-11-16 DIAGNOSIS — R1310 Dysphagia, unspecified: Secondary | ICD-10-CM | POA: Diagnosis present

## 2018-11-16 DIAGNOSIS — L89152 Pressure ulcer of sacral region, stage 2: Secondary | ICD-10-CM | POA: Diagnosis present

## 2018-11-16 DIAGNOSIS — Z515 Encounter for palliative care: Secondary | ICD-10-CM | POA: Diagnosis not present

## 2018-11-16 DIAGNOSIS — J9622 Acute and chronic respiratory failure with hypercapnia: Secondary | ICD-10-CM | POA: Diagnosis present

## 2018-11-16 DIAGNOSIS — Z9889 Other specified postprocedural states: Secondary | ICD-10-CM

## 2018-11-16 DIAGNOSIS — Z923 Personal history of irradiation: Secondary | ICD-10-CM

## 2018-11-16 DIAGNOSIS — Z9111 Patient's noncompliance with dietary regimen: Secondary | ICD-10-CM

## 2018-11-16 DIAGNOSIS — C760 Malignant neoplasm of head, face and neck: Secondary | ICD-10-CM | POA: Diagnosis not present

## 2018-11-16 DIAGNOSIS — G4700 Insomnia, unspecified: Secondary | ICD-10-CM | POA: Diagnosis not present

## 2018-11-16 DIAGNOSIS — J9620 Acute and chronic respiratory failure, unspecified whether with hypoxia or hypercapnia: Secondary | ICD-10-CM | POA: Diagnosis present

## 2018-11-16 DIAGNOSIS — Y92230 Patient room in hospital as the place of occurrence of the external cause: Secondary | ICD-10-CM | POA: Diagnosis not present

## 2018-11-16 DIAGNOSIS — E878 Other disorders of electrolyte and fluid balance, not elsewhere classified: Secondary | ICD-10-CM | POA: Diagnosis present

## 2018-11-16 DIAGNOSIS — A4153 Sepsis due to Serratia: Principal | ICD-10-CM | POA: Diagnosis present

## 2018-11-16 DIAGNOSIS — R627 Adult failure to thrive: Secondary | ICD-10-CM | POA: Diagnosis present

## 2018-11-16 DIAGNOSIS — L89312 Pressure ulcer of right buttock, stage 2: Secondary | ICD-10-CM | POA: Diagnosis present

## 2018-11-16 DIAGNOSIS — C329 Malignant neoplasm of larynx, unspecified: Secondary | ICD-10-CM | POA: Diagnosis present

## 2018-11-16 DIAGNOSIS — D638 Anemia in other chronic diseases classified elsewhere: Secondary | ICD-10-CM | POA: Diagnosis present

## 2018-11-16 DIAGNOSIS — Z7189 Other specified counseling: Secondary | ICD-10-CM | POA: Diagnosis not present

## 2018-11-16 DIAGNOSIS — Z9289 Personal history of other medical treatment: Secondary | ICD-10-CM | POA: Diagnosis not present

## 2018-11-16 DIAGNOSIS — Z79899 Other long term (current) drug therapy: Secondary | ICD-10-CM

## 2018-11-16 DIAGNOSIS — T4275XA Adverse effect of unspecified antiepileptic and sedative-hypnotic drugs, initial encounter: Secondary | ICD-10-CM | POA: Diagnosis not present

## 2018-11-16 DIAGNOSIS — G92 Toxic encephalopathy: Secondary | ICD-10-CM | POA: Diagnosis not present

## 2018-11-16 DIAGNOSIS — R0902 Hypoxemia: Secondary | ICD-10-CM

## 2018-11-16 DIAGNOSIS — E871 Hypo-osmolality and hyponatremia: Secondary | ICD-10-CM | POA: Diagnosis present

## 2018-11-16 DIAGNOSIS — L89621 Pressure ulcer of left heel, stage 1: Secondary | ICD-10-CM | POA: Diagnosis present

## 2018-11-16 DIAGNOSIS — J156 Pneumonia due to other aerobic Gram-negative bacteria: Secondary | ICD-10-CM | POA: Diagnosis present

## 2018-11-16 DIAGNOSIS — J9601 Acute respiratory failure with hypoxia: Secondary | ICD-10-CM | POA: Diagnosis not present

## 2018-11-16 DIAGNOSIS — I952 Hypotension due to drugs: Secondary | ICD-10-CM | POA: Diagnosis not present

## 2018-11-16 DIAGNOSIS — Z930 Tracheostomy status: Secondary | ICD-10-CM | POA: Diagnosis not present

## 2018-11-16 DIAGNOSIS — R0989 Other specified symptoms and signs involving the circulatory and respiratory systems: Secondary | ICD-10-CM

## 2018-11-16 DIAGNOSIS — F419 Anxiety disorder, unspecified: Secondary | ICD-10-CM | POA: Diagnosis not present

## 2018-11-16 DIAGNOSIS — L89322 Pressure ulcer of left buttock, stage 2: Secondary | ICD-10-CM | POA: Diagnosis present

## 2018-11-16 DIAGNOSIS — J439 Emphysema, unspecified: Secondary | ICD-10-CM | POA: Diagnosis present

## 2018-11-16 DIAGNOSIS — Z8701 Personal history of pneumonia (recurrent): Secondary | ICD-10-CM

## 2018-11-16 DIAGNOSIS — Z931 Gastrostomy status: Secondary | ICD-10-CM | POA: Diagnosis not present

## 2018-11-16 DIAGNOSIS — Z9981 Dependence on supplemental oxygen: Secondary | ICD-10-CM

## 2018-11-16 DIAGNOSIS — G8929 Other chronic pain: Secondary | ICD-10-CM | POA: Diagnosis present

## 2018-11-16 DIAGNOSIS — Z8501 Personal history of malignant neoplasm of esophagus: Secondary | ICD-10-CM

## 2018-11-16 LAB — GLUCOSE, CAPILLARY: Glucose-Capillary: 72 mg/dL (ref 70–99)

## 2018-11-16 MED ORDER — MIDAZOLAM BOLUS VIA INFUSION
1.0000 mg | INTRAVENOUS | Status: DC | PRN
  Filled 2018-11-16: qty 2

## 2018-11-16 MED ORDER — FENTANYL 2500MCG IN NS 250ML (10MCG/ML) PREMIX INFUSION
25.0000 ug/h | INTRAVENOUS | Status: DC
  Administered 2018-11-17: 75 ug/h via INTRAVENOUS
  Administered 2018-11-17: 150 ug/h via INTRAVENOUS
  Filled 2018-11-16: qty 250

## 2018-11-16 MED ORDER — DOCUSATE SODIUM 50 MG/5ML PO LIQD
100.0000 mg | Freq: Two times a day (BID) | ORAL | Status: DC | PRN
  Administered 2018-11-22: 08:00:00 100 mg
  Filled 2018-11-16: qty 10

## 2018-11-16 MED ORDER — IPRATROPIUM-ALBUTEROL 0.5-2.5 (3) MG/3ML IN SOLN
3.0000 mL | Freq: Four times a day (QID) | RESPIRATORY_TRACT | Status: DC
  Administered 2018-11-17 (×2): 3 mL via RESPIRATORY_TRACT
  Filled 2018-11-16 (×2): qty 3

## 2018-11-16 MED ORDER — ENOXAPARIN SODIUM 30 MG/0.3ML ~~LOC~~ SOLN
30.0000 mg | SUBCUTANEOUS | Status: DC
  Administered 2018-11-17 – 2018-11-18 (×2): 30 mg via SUBCUTANEOUS
  Filled 2018-11-16 (×2): qty 0.3

## 2018-11-16 MED ORDER — FENTANYL CITRATE (PF) 100 MCG/2ML IJ SOLN: 25.0000 ug | Freq: Once | INTRAMUSCULAR | Status: DC

## 2018-11-16 MED ORDER — GABAPENTIN 250 MG/5ML PO SOLN
100.0000 mg | Freq: Three times a day (TID) | ORAL | Status: DC
  Administered 2018-11-17 – 2018-11-22 (×17): 100 mg
  Filled 2018-11-16 (×20): qty 2

## 2018-11-16 MED ORDER — ALBUTEROL SULFATE (2.5 MG/3ML) 0.083% IN NEBU: 2.5000 mg | INHALATION_SOLUTION | Freq: Four times a day (QID) | RESPIRATORY_TRACT | Status: DC | PRN

## 2018-11-16 MED ORDER — ONDANSETRON HCL 4 MG/2ML IJ SOLN
4.0000 mg | Freq: Four times a day (QID) | INTRAMUSCULAR | Status: DC | PRN
  Administered 2018-11-22 – 2018-12-13 (×30): 4 mg via INTRAVENOUS
  Filled 2018-11-16 (×30): qty 2

## 2018-11-16 MED ORDER — QUETIAPINE FUMARATE 50 MG PO TABS: 50.0000 mg | ORAL_TABLET | Freq: Two times a day (BID) | ORAL | Status: DC

## 2018-11-16 MED ORDER — CHLORHEXIDINE GLUCONATE CLOTH 2 % EX PADS
6.0000 | MEDICATED_PAD | Freq: Every day | CUTANEOUS | Status: DC
  Administered 2018-11-17: 6 via TOPICAL

## 2018-11-16 MED ORDER — PANTOPRAZOLE SODIUM 40 MG IV SOLR
40.0000 mg | Freq: Every day | INTRAVENOUS | Status: DC
  Administered 2018-11-17: 40 mg via INTRAVENOUS
  Filled 2018-11-16: qty 40

## 2018-11-16 MED ORDER — CLONAZEPAM 1 MG PO TABS
1.0000 mg | ORAL_TABLET | Freq: Two times a day (BID) | ORAL | Status: DC
  Administered 2018-11-17 – 2018-11-19 (×7): 1 mg
  Filled 2018-11-16 (×7): qty 1

## 2018-11-16 MED ORDER — MIDAZOLAM 50MG/50ML (1MG/ML) PREMIX INFUSION
2.0000 mg/h | INTRAVENOUS | Status: DC
  Administered 2018-11-17: 2 mg/h via INTRAVENOUS

## 2018-11-16 MED ORDER — FENTANYL BOLUS VIA INFUSION
25.0000 ug | INTRAVENOUS | Status: DC | PRN
  Administered 2018-11-17: 25 ug via INTRAVENOUS
  Filled 2018-11-16: qty 25

## 2018-11-16 MED ORDER — SODIUM CHLORIDE 0.9 % IV SOLN
INTRAVENOUS | Status: DC
  Administered 2018-11-17 (×2): via INTRAVENOUS

## 2018-11-16 NOTE — H&P (Signed)
NAME:  Tony Berry, MRN:  [id number], DOB:  11-02-1960, LOS: 0 ADMISSION DATE:  11/16/2018, CONSULTATION DATE:  8/25 REFERRING MD:  [facility], CHIEF COMPLAINT:  [place]   Brief History   58 year old male with head and neck cancer s/p radiation therapy complicated by dysphagia and recurrent aspiration. [facility]red to [name]/25 from [facility] ED where he required intubation in the setting of AMS and hypoxemic respiratory failure.   History of present illness   58 year old male with PMH as below, which is significant for head and neck cancer s/p radiation therapy, COPD, and recurrent aspiration. Followed by Dr. Valeta Harms in pulmonary clinic. He has PEG in place. He has recent admissions to [facility] and [facility] for recurrent aspiration most recently 8/4 when he required intubation. Also developed septic shock requiring pressors. He is supposed to use thickener to hopefully prevent aspiration, however, he has had issues obtaining/using this in the past.   8/25 he became unresponsive at home and his wife called EMS. Upon EMS arrival Mr Tony Berry remained unresponsive. He is on chronic opioids for pain and was given narcan by EMS with some improvement in symptoms, which was transient. Upon arrival to emergency department he was poorly responsive and remained hypoxemic requiring 15L non-rebreather to maintain adequate O2 sats. He was intubated with concern for further deterioration. Imaging demonstrated LLL opacification, which seemed to be consistent with several recent CXR. Started on low dose levophed for shock. Transferred to [facility].   Past Medical History   has a past medical history of Esophageal cancer ([place]), Head and neck cancer ([place]), and Hypertension.  Significant Hospital Events   8/25 admitted  Consults:    Procedures:  ETT 8/25 >  Significant Diagnostic Tests:    Micro Data:  COVID [facility]) 8/25 > NEG  Antimicrobials:    Interim history/subjective:     Objective   Blood pressure 102/66, resp. rate 16, height 5\' 6"  (1.676 m), weight 45.3 kg, SpO2 100 %.    Vent Mode: PRVC FiO2 (%):  [60 %] 60 % Set Rate:  [12 bmp-18 bmp] 12 bmp Vt Set:  [380 mL-400 mL] 380 mL PEEP:  [5 cmH20] 5 cmH20 Plateau Pressure:  [16 cmH20] 16 cmH20  No intake or output data in the 24 hours ending 11/16/18 2341 Filed Weights   11/16/18 2256  Weight: 45.3 kg    Examination: General: cachectic elderly male in NAD HENT: [place]/AT, pupils pinpoint, no appreciable JVD Lungs: Diminished L base. Otherwise clear. Synchronous with vent.  Cardiovascular: RRR, no MRG Abdomen: PEG site CDI Extremities: No acute deformity Neuro: RASS -4 sedated Skin: Sacral breakdown.  GU: Foley  Resolved Hospital Problem list     Assessment & Plan:   Acute on chronic hypoxemic/hypercabric respiratory failure: unclear if he has recurrent aspiration pneumonia or if this is related to unintentional overdose of home pain medications. He did have some response to narcan. CXR abnormalities are consistent on imaging back through June.  - Full vent support - Consider ABX, check PCT.  - Daily SBT and WUA - minute ventilation decreased based on OSH ABG  Acute encephalopathy:  Not clear whether this is secondary to respiratory complications or due to possible overdose, which could have potentially incited the respiratory complications.  - fentanyl and versed infusions for RASS goal -1 to -2.  - Daily WUA - Consider adding back methadone as he get closer to weaning/extubation. Needs lower dose!  - Gabapentin and konopin star  Shock: septic vs drug effect - Levophed low dose (currently 85mcg) and sedation weaning. Suspect this will improve. - EKG - Assess lactic, troponin  Dysphagia following therapy for head and neck cancer - Tube feeds via PEG  Hyponatremia Hypochloremia - NS @  164mL/Hr - Repeat BMP  Failure to thrive/deconditioning: patient is cachectic and has had two  intubations this month.  - Discussed with wife. Patient ok with short term intubation. No CPR/ACLS should he arrest.   ? Pulmonary nodules: note from outside hospital mentions Dr. Valeta Harms following for nodules, but I do not see Dr. Valeta Harms commenting on this in recent clinic notes. Wife reports some workup pending.  - monitor    Best practice:  Diet: Tube feeds Pain/Anxiety/Delirium protocol (if indicated): Fenatnyl infusion. Wean off versed.  VAP protocol (if indicated): Per prot DVT prophylaxis: lovenox GI prophylaxis: PPI Glucose control: SSI Mobility: BR Code Status: Limited  Family Communication: Wife updated via phone Disposition: ICU  Labs   CBC: Recent Labs  Lab 11/13/18 1950  WBC 8.1  NEUTROABS 6.4  HGB 10.6*  HCT 36.3*  MCV 95.3  PLT 99991111    Basic Metabolic Panel: Recent Labs  Lab 11/13/18 1950  NA 133*  K 4.1  CL 81*  CO2 43*  GLUCOSE 86  BUN 5*  CREATININE 0.43*  CALCIUM 9.0   GFR: Estimated Creatinine Clearance: 65.3 mL/min (A) (by C-G formula based on SCr of 0.43 mg/dL (L)). Recent Labs  Lab 11/13/18 1950  WBC 8.1    Liver Function Tests: No results for input(s): AST, ALT, ALKPHOS, BILITOT, PROT, ALBUMIN in the last 168 hours. No results for input(s): LIPASE, AMYLASE in the last 168 hours. No results for input(s): AMMONIA in the last 168 hours.  ABG    Component Value Date/Time   PHART 7.497 (H) 10/26/2018 2006   PCO2ART 53.0 (H) 10/26/2018 2006   PO2ART 49.0 (L) 10/26/2018 2006   HCO3 40.6 (H) 10/26/2018 2006   TCO2 42 (H) 10/26/2018 2006   O2SAT 83.0 10/26/2018 2006     Coagulation Profile: No results for input(s): INR, PROTIME in the last 168 hours.  Cardiac Enzymes: No results for input(s): CKTOTAL, CKMB, CKMBINDEX, TROPONINI in the last 168 hours.  HbA1C: Hgb A1c MFr Bld  Date/Time Value Ref Range Status  10/27/2018 09:58 AM 5.5 4.8 - 5.6 % Final    Comment:    (NOTE) Pre diabetes:          5.7%-6.4% Diabetes:               >6.4% Glycemic control for   <7.0% adults with diabetes     CBG: Recent Labs  Lab 11/16/18 2303  GLUCAP 72    Review of Systems:   unable  Past Medical History  He,  has a past medical history of Esophageal cancer ([place]), Head and neck cancer ([place]), and Hypertension.   Surgical History    Past Surgical History:  Procedure Laterality Date  . BACK SURGERY  2008, 2011  . FOOT SURGERY Right   . IR GASTROSTOMY TUBE MOD SED  04/08/2018  . LEG SURGERY Right      Social History   reports that he quit smoking about 8 months ago. His smoking use included cigarettes. He started smoking about 30 years ago. He has a 30.00 pack-year smoking history. He has quit using smokeless tobacco. He reports current alcohol use. He reports that he does not use drugs.   Family History   His family  history includes CAD in his father; COPD in his father, mother, and sister; Lung cancer in his mother; Pneumonia in his father. There is no history of Colon cancer.   Allergies No Known Allergies   Home Medications  Prior to Admission medications   Medication Sig Start Date End Date Taking? Authorizing Provider  albuterol (PROVENTIL) (2.5 MG/3ML) 0.083% nebulizer solution Take 2.5 mg by nebulization every 6 (six) hours as needed for wheezing or shortness of breath.     [provider]  albuterol (VENTOLIN HFA) 108 (90 Base) MCG/ACT inhaler Inhale 2 puffs into the lungs every 6 (six) hours as needed for wheezing or shortness of breath. 10/02/18   Mercy Riding, MD  amoxicillin-clavulanate (AUGMENTIN) 875-125 MG tablet Take 1 tablet by mouth 2 (two) times daily. One po bid x 7 days 11/13/18   Charlesetta Shanks, MD  aspirin 81 MG chewable tablet Chew 1 tablet (81 mg total) by mouth daily. Please do not take for five days before your gastrostomy tube placement procedure Patient taking differently: Place 81 mg into feeding tube daily.  03/16/18   Asencion Noble, MD  clopidogrel (PLAVIX) 75 MG tablet  Take 1 tablet (75 mg total) by mouth daily. Please do not take for the 5 days before your gastrostomy tube procedure Patient taking differently: Place 75 mg into feeding tube daily. Please do not take for the 5 days before your gastrostomy tube procedure 05/24/18   Revankar, Reita Cliche, MD  furosemide (LASIX) 20 MG tablet Take 1 tablet (20 mg total) by mouth 2 (two) times daily. Patient taking differently: Place 20 mg into feeding tube 2 (two) times daily as needed for fluid or edema.  10/01/18   Mercy Riding, MD  ipratropium-albuterol (DUONEB) 0.5-2.5 (3) MG/3ML SOLN Take 3 mLs by nebulization every 6 (six) hours as needed (shortness of breath/wheezing).    [provider]  Maltodextrin-Xanthan Gum (RESOURCE THICKENUP CLEAR) POWD Take 120 g by mouth as needed. Patient taking differently: Take by mouth as needed (to thicken liquids).  03/15/18   Asencion Noble, MD  METHADONE HCL PO Place 80 mg into feeding tube every morning.    [provider]  Nutritional Supplements (FEEDING SUPPLEMENT, JEVITY 1.2 CAL,) LIQD Place 474-711 mLs into feeding tube 2 (two) times daily. 2-3 cans twice daily    [provider]  ondansetron (ZOFRAN) 4 MG tablet Place 4 mg into feeding tube every 8 (eight) hours as needed for nausea or vomiting.     [provider]  OXYGEN Inhale 3 L into the lungs continuous.    [provider]  polyethylene glycol (MIRALAX / GLYCOLAX) packet Take 17 g by mouth daily. Patient not taking: Reported on 11/13/2018 03/16/18   Asencion Noble, MD  senna-docusate (SENOKOT-S) 8.6-50 MG tablet Place 1 tablet into feeding tube daily.    [provider]  sodium chloride (OCEAN) 0.65 % SOLN nasal spray Place 1 spray into both nostrils as needed for congestion.    [provider]  sodium chloride 1 g tablet Take 1 tablet (1 g total) by mouth 2 (two) times daily with a meal. Patient taking differently: Place 1 g into feeding tube 2 (two)  times daily with a meal.  10/01/18   Mercy Riding, MD  Tiotropium Bromide-Olodaterol (STIOLTO RESPIMAT) 2.5-2.5 MCG/ACT AERS Inhale 2 puffs into the lungs daily. Patient not taking: Reported on 11/13/2018 10/01/18   Mercy Riding, MD  umeclidinium-vilanterol (ANORO ELLIPTA) 62.5-25 MCG/INH AEPB  Inhale 1 puff into the lungs daily.    [provider]     Critical care time: 30 mins     Georgann Housekeeper, AGACNP-BC [place] Pager [phone] or [phone]  11/17/2018 12:22 AM

## 2018-11-16 NOTE — Telephone Encounter (Signed)
Called patient to discuss appt reminder/holding inhaler for PFT. Pts spouse answered and states EMS is there pt is unresponsive. May need to reschedule PFT. Nothing further needed.

## 2018-11-16 NOTE — Progress Notes (Deleted)
Synopsis: Referred in Feb 2020 for abnormal CT chest by Cher Nakai, MD  Subjective:   PATIENT ID: Tony Berry GENDER: male DOB: 06/14/1960, MRN: [id number]  No chief complaint on file.   PMH of head and neck cancer, recent admission to [facility] for recurrent aspiration pneumonia.  Patient was initially seen in January 2020 admitted for respiratory failure and treated for aspiration pneumonia.  CT scan with bibasilar infiltrates and small amount of mediastinal adenopathy.  CT imaging completed in February a few weeks later also reveals some resolution of the bilateral lower lobe infiltrates as well as smaller adenopathy.  Patient was referred to pulmonary clinic here for evaluation of possible metastatic disease to the chest.  Per the patient he was supposed to be using Thick-It for all thin with liquids.  He has been unable to obtain this due to cost or buying it over-the-counter.  He would like to have prescription of this today.  He has not had any outpatient follow-up with speech therapy.  At baseline he is dyspneic at rest.  He is continuing to use 2 to 4 L of nasal cannula O2 supplementation.  He has no longer smoking since his prior diagnosis of head neck cancer.  He is not sure whether or not he has had head neck cancer versus upper esophageal cancer he thinks may be both but has been presumptively cured after radiation therapy.  We will request records.  Patient denies chest pain, shortness of breath, hemoptysis.  He has been labeled with having COPD in the past however no prior PFTs.  OV 11/17/2018:   Past Medical History:  Diagnosis Date   Esophageal cancer ([place])    Head and neck cancer ([place])    Hypertension      Family History  Problem Relation Age of Onset   COPD Mother    Lung cancer Mother        on HOT    COPD Father    Pneumonia Father    CAD Father        CABG    COPD Sister    Colon cancer Neg Hx      Past Surgical History:  Procedure  Laterality Date   BACK SURGERY  2008, 2011   FOOT SURGERY Right    IR GASTROSTOMY TUBE MOD SED  04/08/2018   LEG SURGERY Right     Social History   Socioeconomic History   Marital status: Married    Spouse name: Not on file   Number of children: Not on file   Years of education: Not on file   Highest education level: Not on file  Occupational History   Occupation: Curator    Comment: Retired   Scientist, product/process development strain: Not on file   Food insecurity    Worry: Not on file    Inability: Not on Lexicographer needs    Medical: Not on file    Non-medical: Not on file  Tobacco Use   Smoking status: Former Smoker    Packs/day: 1.00    Years: 30.00    Pack years: 30.00    Types: Cigarettes    Start date: 03/11/1988    Quit date: 02/25/2018    Years since quitting: 0.7   Smokeless tobacco: Former Network engineer and Sexual Activity   Alcohol use: Yes    Comment: 30 years ago    Drug use: No   Sexual activity: Not on  file  Lifestyle   Physical activity    Days per week: Not on file    Minutes per session: Not on file   Stress: Not on file  Relationships   Social connections    Talks on phone: Not on file    Gets together: Not on file    Attends religious service: Not on file    Active member of club or organization: Not on file    Attends meetings of clubs or organizations: Not on file    Relationship status: Not on file   Intimate partner violence    Fear of current or ex partner: Not on file    Emotionally abused: Not on file    Physically abused: Not on file    Forced sexual activity: Not on file  Other Topics Concern   Not on file  Social History Narrative   Not on file     No Known Allergies   Outpatient Medications Prior to Visit  Medication Sig Dispense Refill   albuterol (PROVENTIL) (2.5 MG/3ML) 0.083% nebulizer solution Take 2.5 mg by nebulization every 6 (six) hours as needed for wheezing or shortness of  breath.      albuterol (VENTOLIN HFA) 108 (90 Base) MCG/ACT inhaler Inhale 2 puffs into the lungs every 6 (six) hours as needed for wheezing or shortness of breath. 8 g 0   amoxicillin-clavulanate (AUGMENTIN) 875-125 MG tablet Take 1 tablet by mouth 2 (two) times daily. One po bid x 7 days 14 tablet 0   aspirin 81 MG chewable tablet Chew 1 tablet (81 mg total) by mouth daily. Please do not take for five days before your gastrostomy tube placement procedure (Patient taking differently: Place 81 mg into feeding tube daily. ) 30 tablet 0   clopidogrel (PLAVIX) 75 MG tablet Take 1 tablet (75 mg total) by mouth daily. Please do not take for the 5 days before your gastrostomy tube procedure (Patient taking differently: Place 75 mg into feeding tube daily. Please do not take for the 5 days before your gastrostomy tube procedure) 90 tablet 1   furosemide (LASIX) 20 MG tablet Take 1 tablet (20 mg total) by mouth 2 (two) times daily. (Patient taking differently: Place 20 mg into feeding tube 2 (two) times daily as needed for fluid or edema. ) 180 tablet 0   ipratropium-albuterol (DUONEB) 0.5-2.5 (3) MG/3ML SOLN Take 3 mLs by nebulization every 6 (six) hours as needed (shortness of breath/wheezing).     Maltodextrin-Xanthan Gum (RESOURCE THICKENUP CLEAR) POWD Take 120 g by mouth as needed. (Patient taking differently: Take by mouth as needed (to thicken liquids). ) 2 Can 0   METHADONE HCL PO Place 80 mg into feeding tube every morning.     Nutritional Supplements (FEEDING SUPPLEMENT, JEVITY 1.2 CAL,) LIQD Place 474-711 mLs into feeding tube 2 (two) times daily. 2-3 cans twice daily     ondansetron (ZOFRAN) 4 MG tablet Place 4 mg into feeding tube every 8 (eight) hours as needed for nausea or vomiting.      OXYGEN Inhale 3 L into the lungs continuous.     polyethylene glycol (MIRALAX / GLYCOLAX) packet Take 17 g by mouth daily. (Patient not taking: Reported on 11/13/2018) 14 each 0   senna-docusate  (SENOKOT-S) 8.6-50 MG tablet Place 1 tablet into feeding tube daily.     sodium chloride (OCEAN) 0.65 % SOLN nasal spray Place 1 spray into both nostrils as needed for congestion.     sodium chloride 1  g tablet Take 1 tablet (1 g total) by mouth 2 (two) times daily with a meal. (Patient taking differently: Place 1 g into feeding tube 2 (two) times daily with a meal. ) 60 tablet 1   Tiotropium Bromide-Olodaterol (STIOLTO RESPIMAT) 2.5-2.5 MCG/ACT AERS Inhale 2 puffs into the lungs daily. (Patient not taking: Reported on 11/13/2018) 4 g 1   umeclidinium-vilanterol (ANORO ELLIPTA) 62.5-25 MCG/INH AEPB Inhale 1 puff into the lungs daily.     No facility-administered medications prior to visit.     Review of Systems  Constitutional: Negative for chills, fever, malaise/fatigue and weight loss.  HENT: Negative for hearing loss, sore throat and tinnitus.   Eyes: Negative for blurred vision and double vision.  Respiratory: Positive for cough, sputum production, shortness of breath and wheezing. Negative for hemoptysis and stridor.   Cardiovascular: Negative for chest pain, palpitations, orthopnea, leg swelling and PND.  Gastrointestinal: Negative for abdominal pain, constipation, diarrhea, heartburn, nausea and vomiting.  Genitourinary: Negative for dysuria, hematuria and urgency.  Musculoskeletal: Negative for joint pain and myalgias.  Skin: Negative for itching and rash.  Neurological: Negative for dizziness, tingling, weakness and headaches.  Endo/Heme/Allergies: Negative for environmental allergies. Does not bruise/bleed easily.  Psychiatric/Behavioral: Negative for depression. The patient is not nervous/anxious and does not have insomnia.   All other systems reviewed and are negative.    Objective:  Physical Exam Vitals signs reviewed.  Constitutional:      General: He is not in acute distress.    Appearance: He is well-developed.  HENT:     Head: Normocephalic and atraumatic.      Mouth/Throat:     Pharynx: No oropharyngeal exudate.  Eyes:     Conjunctiva/sclera: Conjunctivae normal.     Pupils: Pupils are equal, round, and reactive to light.  Neck:     Vascular: No JVD.     Trachea: No tracheal deviation.     Comments: Loss of supraclavicular fat Cardiovascular:     Rate and Rhythm: Normal rate and regular rhythm.     Heart sounds: S1 normal and S2 normal.     Comments: Distant heart tones Pulmonary:     Effort: No tachypnea or accessory muscle usage.     Breath sounds: No stridor. Decreased breath sounds (throughout all lung fields) and wheezing present. No rhonchi or rales.  Abdominal:     General: Bowel sounds are normal. There is no distension.     Palpations: Abdomen is soft.     Tenderness: There is no abdominal tenderness.  Musculoskeletal:        General: Deformity (muscle wasting ) present.  Skin:    General: Skin is warm and dry.     Capillary Refill: Capillary refill takes less than 2 seconds.     Findings: No rash.     Comments: Multiple individually placed tattoos  Neurological:     Mental Status: He is alert and oriented to person, place, and time.  Psychiatric:        Behavior: Behavior normal.      There were no vitals filed for this visit.   on RA BMI Readings from Last 3 Encounters:  10/31/18 18.33 kg/m  09/30/18 16.92 kg/m  05/17/18 18.53 kg/m   Wt Readings from Last 3 Encounters:  10/31/18 117 lb 1 oz (53.1 kg)  09/30/18 108 lb 0.4 oz (49 kg)  05/17/18 114 lb 12.8 oz (52.1 kg)     CBC    Component Value Date/Time   WBC  8.1 11/13/2018 1950   RBC 3.81 (L) 11/13/2018 1950   HGB 10.6 (L) 11/13/2018 1950   HCT 36.3 (L) 11/13/2018 1950   PLT 310 11/13/2018 1950   MCV 95.3 11/13/2018 1950   MCH 27.8 11/13/2018 1950   MCHC 29.2 (L) 11/13/2018 1950   RDW 13.2 11/13/2018 1950   LYMPHSABS 0.7 11/13/2018 1950   MONOABS 0.8 11/13/2018 1950   EOSABS 0.1 11/13/2018 1950   BASOSABS 0.0 11/13/2018 1950   Chest  Imaging: CT chest 04/06/2018: Mildly enlarged mediastinal and hilar adenopathy, significant paraseptal and centrilobular emphysema, bibasilar tree-in-bud opacities consistent with multifocal pneumonia and likely secondary to aspiration. The patient's images have been independently reviewed by me.    Pulmonary Functions Testing Results: No flowsheet data found.  FeNO: None   Pathology: None  Echocardiogram: None   Heart Catheterization: None     Assessment & Plan:   No diagnosis found.  Discussion:  This is a 58 year old gentleman with recurrent aspiration pneumonia, cachexia, protein caloric malnutrition from poor p.o. intake, status post PEG tube placement with a history of head neck cancer status post radiation therapy.  He is unsure if he had tonsillar cancer or esophageal cancer and I have none of these records from [place] today to be able to review.  I have reviewed the images today from [facility] as well as the ones completed in January [facility] at [name].  It does appear that the bibasilar infiltrates look improved.  The mediastinal adenopathy is still present but not enlarged or changed in the short interval of time for imaging.  I think the biggest thing to conquer today would be referral to speech therapy and continued speech evaluation in the outpatient.  He has been eating and drinking and not necessarily following his dietary restrictions.  I think that recurrent aspiration into the airway is the most fatal problem that he is experiencing.  I also placed an order for Thick-It as the patient was unable to purchase this over-the-counter. He has had a PEG tube recently placed that they have started using for feeding.  He also has a follow-up planned with gastroenterology to consider evaluation for potential esophageal dilatation for concern of underlying stricture.  I think we should stop his Trelegy.  I am not sure that he is gaining any benefit from this as he is likely too  weak to be able to pull in the medication necessary.  We will transition all of these medications to nebulized drugs.  I have placed these orders.  We will start him on Perforomist and Yupelri  Patient would like to establish care with oncology here.  I have placed a referral for medical oncology at [facility].  Greater than 50% of this patient's 45-minute office visit was been face-to-face discussing the above recommendations and treatment plan.   Current Outpatient Medications:    albuterol (PROVENTIL) (2.5 MG/3ML) 0.083% nebulizer solution, Take 2.5 mg by nebulization every 6 (six) hours as needed for wheezing or shortness of breath. , Disp: , Rfl:    albuterol (VENTOLIN HFA) 108 (90 Base) MCG/ACT inhaler, Inhale 2 puffs into the lungs every 6 (six) hours as needed for wheezing or shortness of breath., Disp: 8 g, Rfl: 0   amoxicillin-clavulanate (AUGMENTIN) 875-125 MG tablet, Take 1 tablet by mouth 2 (two) times daily. One po bid x 7 days, Disp: 14 tablet, Rfl: 0   aspirin 81 MG chewable tablet, Chew 1 tablet (81 mg total) by mouth daily. Please do not take  for five days before your gastrostomy tube placement procedure (Patient taking differently: Place 81 mg into feeding tube daily. ), Disp: 30 tablet, Rfl: 0   clopidogrel (PLAVIX) 75 MG tablet, Take 1 tablet (75 mg total) by mouth daily. Please do not take for the 5 days before your gastrostomy tube procedure (Patient taking differently: Place 75 mg into feeding tube daily. Please do not take for the 5 days before your gastrostomy tube procedure), Disp: 90 tablet, Rfl: 1   furosemide (LASIX) 20 MG tablet, Take 1 tablet (20 mg total) by mouth 2 (two) times daily. (Patient taking differently: Place 20 mg into feeding tube 2 (two) times daily as needed for fluid or edema. ), Disp: 180 tablet, Rfl: 0   ipratropium-albuterol (DUONEB) 0.5-2.5 (3) MG/3ML SOLN, Take 3 mLs by nebulization every 6 (six) hours as needed (shortness of  breath/wheezing)., Disp: , Rfl:    Maltodextrin-Xanthan Gum (RESOURCE THICKENUP CLEAR) POWD, Take 120 g by mouth as needed. (Patient taking differently: Take by mouth as needed (to thicken liquids). ), Disp: 2 Can, Rfl: 0   METHADONE HCL PO, Place 80 mg into feeding tube every morning., Disp: , Rfl:    Nutritional Supplements (FEEDING SUPPLEMENT, JEVITY 1.2 CAL,) LIQD, Place 474-711 mLs into feeding tube 2 (two) times daily. 2-3 cans twice daily, Disp: , Rfl:    ondansetron (ZOFRAN) 4 MG tablet, Place 4 mg into feeding tube every 8 (eight) hours as needed for nausea or vomiting. , Disp: , Rfl:    OXYGEN, Inhale 3 L into the lungs continuous., Disp: , Rfl:    polyethylene glycol (MIRALAX / GLYCOLAX) packet, Take 17 g by mouth daily. (Patient not taking: Reported on 11/13/2018), Disp: 14 each, Rfl: 0   senna-docusate (SENOKOT-S) 8.6-50 MG tablet, Place 1 tablet into feeding tube daily., Disp: , Rfl:    sodium chloride (OCEAN) 0.65 % SOLN nasal spray, Place 1 spray into both nostrils as needed for congestion., Disp: , Rfl:    sodium chloride 1 g tablet, Take 1 tablet (1 g total) by mouth 2 (two) times daily with a meal. (Patient taking differently: Place 1 g into feeding tube 2 (two) times daily with a meal. ), Disp: 60 tablet, Rfl: 1   Tiotropium Bromide-Olodaterol (STIOLTO RESPIMAT) 2.5-2.5 MCG/ACT AERS, Inhale 2 puffs into the lungs daily. (Patient not taking: Reported on 11/13/2018), Disp: 4 g, Rfl: 1   umeclidinium-vilanterol (ANORO ELLIPTA) 62.5-25 MCG/INH AEPB, Inhale 1 puff into the lungs daily., Disp: , Rfl:    Garner Nash, DO [facility] 11/16/2018 3:49 PM

## 2018-11-16 NOTE — Progress Notes (Signed)
eLink Physician-Brief Progress Note Patient Name: Tony Berry DOB: [birth year] MRN: [id number]   Date of Service  11/16/2018  HPI/Events of Note  58 yo M transferred from [facility] ED with acute hypoxemic respiratory failure presumed to be due to pneumonia.  eICU Interventions  New patient evaluation completed        Frederik Pear 11/16/2018, 11:18 PM

## 2018-11-17 ENCOUNTER — Inpatient Hospital Stay (HOSPITAL_COMMUNITY): Payer: Medicaid Other

## 2018-11-17 ENCOUNTER — Ambulatory Visit: Payer: Medicaid Other | Admitting: Pulmonary Disease

## 2018-11-17 DIAGNOSIS — Z931 Gastrostomy status: Secondary | ICD-10-CM

## 2018-11-17 DIAGNOSIS — C760 Malignant neoplasm of head, face and neck: Secondary | ICD-10-CM | POA: Diagnosis present

## 2018-11-17 DIAGNOSIS — J690 Pneumonitis due to inhalation of food and vomit: Secondary | ICD-10-CM

## 2018-11-17 DIAGNOSIS — R627 Adult failure to thrive: Secondary | ICD-10-CM | POA: Diagnosis present

## 2018-11-17 DIAGNOSIS — R64 Cachexia: Secondary | ICD-10-CM | POA: Diagnosis present

## 2018-11-17 DIAGNOSIS — I952 Hypotension due to drugs: Secondary | ICD-10-CM | POA: Diagnosis present

## 2018-11-17 DIAGNOSIS — Z978 Presence of other specified devices: Secondary | ICD-10-CM | POA: Diagnosis present

## 2018-11-17 LAB — URINALYSIS, ROUTINE W REFLEX MICROSCOPIC
Bilirubin Urine: NEGATIVE
Glucose, UA: NEGATIVE mg/dL
Hgb urine dipstick: NEGATIVE
Ketones, ur: NEGATIVE mg/dL
Nitrite: NEGATIVE
Protein, ur: NEGATIVE mg/dL
Specific Gravity, Urine: 1.011 (ref 1.005–1.030)
pH: 8 (ref 5.0–8.0)

## 2018-11-17 LAB — CBC WITH DIFFERENTIAL/PLATELET
Abs Immature Granulocytes: 0.04 10*3/uL (ref 0.00–0.07)
Basophils Absolute: 0 10*3/uL (ref 0.0–0.1)
Basophils Relative: 0 %
Eosinophils Absolute: 0 10*3/uL (ref 0.0–0.5)
Eosinophils Relative: 0 %
HCT: 32.4 % — ABNORMAL LOW (ref 39.0–52.0)
Hemoglobin: 10 g/dL — ABNORMAL LOW (ref 13.0–17.0)
Immature Granulocytes: 0 %
Lymphocytes Relative: 7 %
Lymphs Abs: 0.6 10*3/uL — ABNORMAL LOW (ref 0.7–4.0)
MCH: 27.9 pg (ref 26.0–34.0)
MCHC: 30.9 g/dL (ref 30.0–36.0)
MCV: 90.3 fL (ref 80.0–100.0)
Monocytes Absolute: 0.8 10*3/uL (ref 0.1–1.0)
Monocytes Relative: 8 %
Neutro Abs: 7.7 10*3/uL (ref 1.7–7.7)
Neutrophils Relative %: 85 %
Platelets: 243 10*3/uL (ref 150–400)
RBC: 3.59 MIL/uL — ABNORMAL LOW (ref 4.22–5.81)
RDW: 12.6 % (ref 11.5–15.5)
WBC: 9.1 10*3/uL (ref 4.0–10.5)
nRBC: 0 % (ref 0.0–0.2)

## 2018-11-17 LAB — GLUCOSE, CAPILLARY
Glucose-Capillary: 66 mg/dL — ABNORMAL LOW (ref 70–99)
Glucose-Capillary: 70 mg/dL (ref 70–99)
Glucose-Capillary: 70 mg/dL (ref 70–99)
Glucose-Capillary: 75 mg/dL (ref 70–99)
Glucose-Capillary: 75 mg/dL (ref 70–99)
Glucose-Capillary: 83 mg/dL (ref 70–99)
Glucose-Capillary: 83 mg/dL (ref 70–99)
Glucose-Capillary: 96 mg/dL (ref 70–99)

## 2018-11-17 LAB — MRSA PCR SCREENING: MRSA by PCR: NEGATIVE

## 2018-11-17 LAB — COMPREHENSIVE METABOLIC PANEL
ALT: 10 U/L (ref 0–44)
AST: 27 U/L (ref 15–41)
Albumin: 2 g/dL — ABNORMAL LOW (ref 3.5–5.0)
Alkaline Phosphatase: 42 U/L (ref 38–126)
Anion gap: 11 (ref 5–15)
BUN: 11 mg/dL (ref 6–20)
CO2: 36 mmol/L — ABNORMAL HIGH (ref 22–32)
Calcium: 8.2 mg/dL — ABNORMAL LOW (ref 8.9–10.3)
Chloride: 85 mmol/L — ABNORMAL LOW (ref 98–111)
Creatinine, Ser: 0.46 mg/dL — ABNORMAL LOW (ref 0.61–1.24)
GFR calc Af Amer: >60 mL/min (ref >60)
GFR calc non Af Amer: >60 mL/min (ref >60)
Glucose, Bld: 67 mg/dL — ABNORMAL LOW (ref 70–99)
Potassium: 4.1 mmol/L (ref 3.5–5.1)
Sodium: 132 mmol/L — ABNORMAL LOW (ref 135–145)
Total Bilirubin: 1.3 mg/dL — ABNORMAL HIGH (ref 0.3–1.2)
Total Protein: 5.9 g/dL — ABNORMAL LOW (ref 6.5–8.1)

## 2018-11-17 LAB — POCT I-STAT 7, (LYTES, BLD GAS, ICA,H+H)
Acid-Base Excess: 19 mmol/L — ABNORMAL HIGH (ref 0.0–2.0)
Bicarbonate: 46.4 mmol/L — ABNORMAL HIGH (ref 20.0–28.0)
Calcium, Ion: 1.19 mmol/L (ref 1.15–1.40)
HCT: 32 % — ABNORMAL LOW (ref 39.0–52.0)
Hemoglobin: 10.9 g/dL — ABNORMAL LOW (ref 13.0–17.0)
O2 Saturation: 99 %
Patient temperature: 99
Potassium: 3.4 mmol/L — ABNORMAL LOW (ref 3.5–5.1)
Sodium: 132 mmol/L — ABNORMAL LOW (ref 135–145)
TCO2: 48 mmol/L — ABNORMAL HIGH (ref 22–32)
pCO2 arterial: 70.2 mmHg (ref 32.0–48.0)
pH, Arterial: 7.429 (ref 7.350–7.450)
pO2, Arterial: 120 mmHg — ABNORMAL HIGH (ref 83.0–108.0)

## 2018-11-17 LAB — MAGNESIUM
Magnesium: 1.4 mg/dL — ABNORMAL LOW (ref 1.7–2.4)
Magnesium: 1.8 mg/dL (ref 1.7–2.4)

## 2018-11-17 LAB — LACTIC ACID, PLASMA
Lactic Acid, Venous: 1.8 mmol/L (ref 0.5–1.9)
Lactic Acid, Venous: 1.7 mmol/L (ref 0.5–1.9)

## 2018-11-17 LAB — CHLORIDE, URINE, RANDOM: Chloride Urine: 20 mmol/L

## 2018-11-17 LAB — PHOSPHORUS: Phosphorus: 2.7 mg/dL (ref 2.5–4.6)

## 2018-11-17 LAB — NA AND K (SODIUM & POTASSIUM), RAND UR
Potassium Urine: 47 mmol/L
Sodium, Ur: 23 mmol/L

## 2018-11-17 LAB — PROTIME-INR
INR: 1.2 (ref 0.8–1.2)
Prothrombin Time: 14.8 seconds (ref 11.4–15.2)

## 2018-11-17 LAB — CORTISOL: Cortisol, Plasma: 8.6 ug/dL

## 2018-11-17 LAB — PROCALCITONIN: Procalcitonin: 0.1 ng/mL

## 2018-11-17 LAB — TROPONIN I (HIGH SENSITIVITY)
Troponin I (High Sensitivity): 31 ng/L — ABNORMAL HIGH (ref <18)
Troponin I (High Sensitivity): 30 ng/L — ABNORMAL HIGH (ref <18)

## 2018-11-17 MED ORDER — MAGNESIUM SULFATE 2 GM/50ML IV SOLN
2.0000 g | Freq: Once | INTRAVENOUS | Status: AC
  Administered 2018-11-17: 10:00:00 2 g via INTRAVENOUS
  Filled 2018-11-17: qty 50

## 2018-11-17 MED ORDER — ARFORMOTEROL TARTRATE 15 MCG/2ML IN NEBU
15.0000 ug | INHALATION_SOLUTION | Freq: Two times a day (BID) | RESPIRATORY_TRACT | Status: DC
  Administered 2018-11-17 – 2018-11-22 (×12): 15 ug via RESPIRATORY_TRACT
  Filled 2018-11-17 (×11): qty 2

## 2018-11-17 MED ORDER — OXYCODONE HCL 5 MG PO TABS: 5.0000 mg | ORAL_TABLET | ORAL | Status: DC

## 2018-11-17 MED ORDER — BUDESONIDE 0.5 MG/2ML IN SUSP
0.5000 mg | Freq: Two times a day (BID) | RESPIRATORY_TRACT | Status: DC
  Administered 2018-11-17 – 2018-11-22 (×12): 0.5 mg via RESPIRATORY_TRACT
  Filled 2018-11-17 (×12): qty 2

## 2018-11-17 MED ORDER — PANTOPRAZOLE SODIUM 40 MG PO PACK
40.0000 mg | PACK | Freq: Every day | ORAL | Status: DC
  Administered 2018-11-17 – 2018-12-12 (×26): 40 mg
  Filled 2018-11-17 (×26): qty 20

## 2018-11-17 MED ORDER — OXYCODONE HCL 5 MG PO TABS
5.0000 mg | ORAL_TABLET | ORAL | Status: DC
  Administered 2018-11-17 – 2018-11-18 (×5): 5 mg
  Filled 2018-11-17 (×5): qty 1

## 2018-11-17 MED ORDER — OXYCODONE HCL 5 MG PO TABS
5.0000 mg | ORAL_TABLET | ORAL | Status: DC | PRN
  Administered 2018-11-17 – 2018-11-20 (×3): 5 mg
  Filled 2018-11-17 (×3): qty 1

## 2018-11-17 MED ORDER — NOREPINEPHRINE 4 MG/250ML-% IV SOLN
0.0000 ug/min | INTRAVENOUS | Status: DC
  Administered 2018-11-17: 3 ug/min via INTRAVENOUS

## 2018-11-17 MED ORDER — SODIUM CHLORIDE 0.9 % IV SOLN
2.0000 g | INTRAVENOUS | Status: AC
  Administered 2018-11-17 – 2018-11-21 (×5): 2 g via INTRAVENOUS
  Filled 2018-11-17 (×5): qty 20

## 2018-11-17 MED ORDER — CHLORHEXIDINE GLUCONATE 0.12% ORAL RINSE (MEDLINE KIT)
15.0000 mL | Freq: Two times a day (BID) | OROMUCOSAL | Status: DC
  Administered 2018-11-17 – 2018-11-23 (×15): 15 mL via OROMUCOSAL

## 2018-11-17 MED ORDER — PRO-STAT SUGAR FREE PO LIQD
30.0000 mL | Freq: Two times a day (BID) | ORAL | Status: DC
  Administered 2018-11-17 – 2018-11-23 (×14): 30 mL
  Filled 2018-11-17 (×13): qty 30

## 2018-11-17 MED ORDER — VITAL AF 1.2 CAL PO LIQD
1000.0000 mL | ORAL | Status: DC
  Administered 2018-11-17: 1000 mL

## 2018-11-17 MED ORDER — OXYCODONE HCL 5 MG PO TABS: 5.0000 mg | ORAL_TABLET | ORAL | Status: DC | PRN

## 2018-11-17 MED ORDER — ORAL CARE MOUTH RINSE
15.0000 mL | OROMUCOSAL | Status: DC
  Administered 2018-11-17 – 2018-11-23 (×61): 15 mL via OROMUCOSAL

## 2018-11-17 MED ORDER — IPRATROPIUM-ALBUTEROL 0.5-2.5 (3) MG/3ML IN SOLN
3.0000 mL | Freq: Three times a day (TID) | RESPIRATORY_TRACT | Status: DC
  Administered 2018-11-17 – 2018-11-18 (×3): 3 mL via RESPIRATORY_TRACT
  Filled 2018-11-17 (×2): qty 3

## 2018-11-17 MED ORDER — VITAL HIGH PROTEIN PO LIQD
1000.0000 mL | ORAL | Status: DC
  Administered 2018-11-17: 1000 mL

## 2018-11-17 MED ORDER — SODIUM CHLORIDE 0.9 % IV BOLUS
500.0000 mL | Freq: Once | INTRAVENOUS | Status: AC
  Administered 2018-11-17: 500 mL via INTRAVENOUS

## 2018-11-17 NOTE — Consult Note (Addendum)
[facility] Nurse wound consult note Reason for Consult: Consult requested for heels and sacrum.  Pt was noted to have stage 1 pressure injuries, present on admission to bilat heels.  Skin is intact and blanches when assessed today Wound type: Sacrum was noted to have a deep tissue pressure injury on admission.  This has evolved into a stage 2 pressure injury, pink and moist, .4X.4X.2cm Pressure Injury POA: Yes Plan:  Float heels to reduce pressure.  Foam dressing to protect from further injury to sacrum. Please re-consult if further assistance is needed.  Thank-you,  Julien Girt MSN, [place], [place], [place], [place]

## 2018-11-17 NOTE — Progress Notes (Signed)
Unable to receive sputum at this time. Will attempt again later. RN aware and will help collect if able.

## 2018-11-17 NOTE — Progress Notes (Signed)
Initial Nutrition Assessment  DOCUMENTATION CODES:   Severe malnutrition in context of chronic illness, Underweight  INTERVENTION:   Tube Feeding: Change to Vital AF 1.2 at 10 ml/hr Goal rate: Vital AF 1.2 at 50 ml/hr Provides 90 g of protein, 1440 kcals and 972 mL of free water Meets 100% estimated needs D/C Pro-Stat once able to begin titrating TF to goal rate  Monitor magnesium, potassium, and phosphorus daily for at least 5 occurences, MD to replete as needed, as pt is at risk for refeeding syndrome given severe malnutrition.  NUTRITION DIAGNOSIS:   Severe Malnutrition related to chronic illness(head and neck cancer with dysphagia and aspiration, COPD) as evidenced by severe fat depletion, severe muscle depletion.  GOAL:   Patient will meet greater than or equal to 90% of their needs  MONITOR:   TF tolerance, Vent status, Labs, Weight trends, Skin  REASON FOR ASSESSMENT:   Consult, Ventilator Enteral/tube feeding initiation and management  ASSESSMENT:   58 yo male admitted with acute on chronic respiratory failure secondary to aspiration requiring intubation, septic shock. PMH includes head and neck cancer with dysphagia and aspriation s/p PEG tube, COPD, malnutrition   Patient is currently intubated on ventilator support MV: 5.3 L/min Temp (24hrs), Avg:98.5 F (36.9 C), Min:98.5 F (36.9 C), Max:98.5 F (36.9 C)  Vital high Protein at 10 ml/hr and Pro-Stat 30 mL BID via G-tube  Unable to obtain diet and weight history from pt at this time  Pt has PEG tube but still takes po per MD notes and does not follow SLP recommendations  SLP evaluated pt 1 month ago and placed pt on Nectar Thick Liquid diet only, no solids. Unsure how compliant pt has been with his diet or his TF. Pt meets criteria for severe malnutrition on exam. Per home med list, pt takes 6 cartons of Jevity 1.2 per day. 1710 kcals, 79 g of protein.   Per weight encounters, pt has lost weight since  last admission. Current wt 45.3 kg; weight of 53.1 kg on 10/31/18, 49 kg on 09/30/18, 51-52 kg in 04/2018. >/= 8% wt loss in 1 month or so.   Labs: sodium 132 (L), potassium wdl, phosphorus wdl, magnesium wdl Meds: reviewed   NUTRITION - FOCUSED PHYSICAL EXAM:    Most Recent Value  Orbital Region  Severe depletion  Upper Arm Region  Severe depletion  Thoracic and Lumbar Region  Severe depletion  Buccal Region  Severe depletion  Temple Region  Severe depletion  Clavicle Bone Region  Severe depletion  Clavicle and Acromion Bone Region  Severe depletion  Scapular Bone Region  Severe depletion  Dorsal Hand  Unable to assess  Patellar Region  Severe depletion  Anterior Thigh Region  Severe depletion  Posterior Calf Region  Severe depletion  Edema (RD Assessment)  None       Diet Order:   Diet Order    None      EDUCATION NEEDS:   Not appropriate for education at this time  Skin:  Skin Integrity Issues:: Stage II, Stage I Stage I: b/l heels Stage II: sacrum  Last BM:  no documented BM  Height:   Ht Readings from Last 1 Encounters:  11/16/18 5\' 6"  (1.676 m)    Weight:   Wt Readings from Last 1 Encounters:  11/17/18 45.3 kg    Ideal Body Weight:  67.3 kg  BMI:  Body mass index is 16.12 kg/m.  Estimated Nutritional Needs:   Kcal:  1505 kcals  Protein:  80-105 g  Fluid:  >/= 1.5 L    [employer] MS, RDN, LDN, CNSC [phone] Pager  [phone] Weekend/On-Call Pager

## 2018-11-17 NOTE — Progress Notes (Addendum)
NAME:  Tony Berry, MRN:  [id number], DOB:  1960-08-29, LOS: 1 ADMISSION DATE:  11/16/2018, CONSULTATION DATE:  8/25 REFERRING MD:  [facility], CHIEF COMPLAINT:  Dypsea   Brief History   59 year old male with head and neck cancer s/p radiation therapy complicated by dysphagia and recurrent aspiration. [name]25 from [facility] ED where he required intubation in the setting of AMS and hypoxemic respiratory failure.   History of present illness   58 year old male with PMH as below, which is significant for head and neck cancer s/p radiation therapy, COPD, and recurrent aspiration. Followed by Dr. Valeta Harms in pulmonary clinic. He has PEG in place. He has recent admissions to [facility] and [facility] for recurrent aspiration most recently 8/4 when he required intubation. Also developed septic shock requiring pressors. He is supposed to use thickener to hopefully prevent aspiration, however, he has had issues obtaining/using this in the past.   8/25 he became unresponsive at home and his wife called EMS. Upon EMS arrival Mr Vasa remained unresponsive. He is on chronic opioids for pain and was given narcan by EMS with some improvement in symptoms, which was transient. Upon arrival to emergency department he was poorly responsive and remained hypoxemic requiring 15L non-rebreather to maintain adequate O2 sats. He was intubated with concern for further deterioration. Imaging demonstrated LLL opacification, which seemed to be consistent with several recent CXR. Started on low dose levophed for shock. Transferred to [facility].  He remains intubated with full Vent support  Past Medical History   has a past medical history of Esophageal cancer ([place]), Head and neck cancer ([place]), and Hypertension.  Significant Hospital Events   8/25 admitted  Consults:  8/26 Palliative Care  Procedures:  ETT 8/25 >  Significant Diagnostic Tests:    Micro Data:  COVID [facility]) 8/25 >> NEG  Respiratory Culture 8/26>>  Antimicrobials:  Ceftriaxone 8/26>  Interim history/subjective:  Failed SBT today, remains on full Vent support.  Objective   Blood pressure (!) 103/59, temperature 98.5 F (36.9 C), temperature source Axillary, resp. rate 16, height 5\' 6"  (1.676 m), weight 45.3 kg, SpO2 97 %.    Vent Mode: PRVC FiO2 (%):  [60 %] 60 % Set Rate:  [12 bmp-18 bmp] 12 bmp Vt Set:  [380 mL-400 mL] 380 mL PEEP:  [5 cmH20] 5 cmH20 Plateau Pressure:  [15 cmH20-16 cmH20] 15 cmH20   Intake/Output Summary (Last 24 hours) at 11/17/2018 0759 Last data filed at 11/17/2018 0700 Gross per 24 hour  Intake 1012.44 ml  Output 375 ml  Net 637.44 ml   Filed Weights   11/16/18 2256 11/17/18 0500  Weight: 45.3 kg 45.3 kg    General:  Chronically ill, cachectic  HEENT: MM pink/moist Neuro: opening eyes to command, not otherwise responsive CV: s1s2, regular rate and rhythm, no m/r/g PULM:  Rhonchi bilaterally GI: soft, bsx4 active  Extremities: warm/dry, no edema  Skin: covered heel pressure ulcers  Resolved Hospital Problem list     Assessment & Plan:  Acute on chronic hypoxemic/hypercabric respiratory failure likely secondary to chronic aspiration and underlying emphysema -Continues on full vent support, unable to wean this morning -Suspect his respiratory failure is secondary to ongoing aspiration PNA secondary to dysphagia and high dose methadone, palliative care has been involved in prior hospitalizations, he is limited DNR -Afebrile, no leukocytosis and PCT 0.10 suggest no acute bacterial infection requiring antibiotics, trach aspirate 8/4 grew Serratia, treated with Zosyn during  last hospitalization -Transient improvement with Narcan, opioids possibly exacerbating risk of aspiration P: -Repeat CXR today, consider CT chest to evaluate for PE, though clinically think aspiration is more likely -Repeat respiratory culture, start Ceftriaxone -Continue Full Vent support  -VAP  bundle -continue Brovana, Pulmicort, Duonebs -Repeat SBT and SAT daily -Consult palliative care, spoke with wife, she wants aggressive care, but pt has specifically stated he did not want a trach in the past    Acute encephalopathy -secondary to high dose methadone and hypercarbic respiratory failure -Remains sedated on Vent P: -Holding methadone, versed gtt d/c'd -Continue Fentanyl gtt to maintain RASS goal of -1 to -2 -Home Gabapentin and Klonopin continued    Shock -Etiology unclear, does not meet sepsis criteria -suspect may be secondary to medications  -improving -Trop mildly elevated and flat on repeat, doubt cardiogenic shock P: - Continue to wean Levophed   Hypomagnesemia and prolonged Qtc -EKG shows Qtc of 602 and Mag 1.4 P: -Mag 2g IV now and repeat level -Repeat EKG tomorrow and avoid Qtc prolonging medications  Chronic Dysphagia and aspiration secondary to head and neck Ca and failure to thrive  -Per wife, pt uses PEG but still drinks liquids po, uses thickener -Completed radiation therapy at [place] approximately two years ago  P: - Tube feeds via PEG -Suction -Consult Palliative Care, pt does not want trach   Hyponatremia Hypochloremia - NS @  165mL/Hr       Best practice:  Diet: Tube feeds Pain/Anxiety/Delirium protocol (if indicated): Fenatnyl infusion. Wean off versed.  VAP protocol (if indicated): Per prot DVT prophylaxis: lovenox GI prophylaxis: PPI Glucose control: SSI Mobility: BR Code Status: Limited  Family Communication: Wife updated via phone Disposition: ICU  Labs   CBC: Recent Labs  Lab 11/13/18 1950 11/17/18 0050 11/17/18 0129  WBC 8.1  --  9.1  NEUTROABS 6.4  --  7.7  HGB 10.6* 10.9* 10.0*  HCT 36.3* 32.0* 32.4*  MCV 95.3  --  90.3  PLT 310  --  0000000    Basic Metabolic Panel: Recent Labs  Lab 11/13/18 1950 11/17/18 0050 11/17/18 0417  NA 133* 132* 132*  K 4.1 3.4* 4.1  CL 81*  --  85*  CO2 43*  --  36*   GLUCOSE 86  --  67*  BUN 5*  --  11  CREATININE 0.43*  --  0.46*  CALCIUM 9.0  --  8.2*  MG  --   --  1.4*  PHOS  --   --  2.7   GFR: Estimated Creatinine Clearance: 65.3 mL/min (A) (by C-G formula based on SCr of 0.46 mg/dL (L)). Recent Labs  Lab 11/13/18 1950 11/17/18 0129 11/17/18 0417  PROCALCITON  --   --  0.10  WBC 8.1 9.1  --   LATICACIDVEN  --  1.8 1.7    Liver Function Tests: Recent Labs  Lab 11/17/18 0417  AST 27  ALT 10  ALKPHOS 42  BILITOT 1.3*  PROT 5.9*  ALBUMIN 2.0*   No results for input(s): LIPASE, AMYLASE in the last 168 hours. No results for input(s): AMMONIA in the last 168 hours.  ABG    Component Value Date/Time   PHART 7.429 11/17/2018 0050   PCO2ART 70.2 (HH) 11/17/2018 0050   PO2ART 120.0 (H) 11/17/2018 0050   HCO3 46.4 (H) 11/17/2018 0050   TCO2 48 (H) 11/17/2018 0050   O2SAT 99.0 11/17/2018 0050     Coagulation Profile: Recent Labs  Lab 11/17/18 0129  INR 1.2    Cardiac Enzymes: No results for input(s): CKTOTAL, CKMB, CKMBINDEX, TROPONINI in the last 168 hours.  HbA1C: Hgb A1c MFr Bld  Date/Time Value Ref Range Status  10/27/2018 09:58 AM 5.5 4.8 - 5.6 % Final    Comment:    (NOTE) Pre diabetes:          5.7%-6.4% Diabetes:              >6.4% Glycemic control for   <7.0% adults with diabetes     CBG: Recent Labs  Lab 11/16/18 2303 11/17/18 0047 11/17/18 0346  GLUCAP 72 70 70    Past Medical History  He,  has a past medical history of Esophageal cancer ([place]), Head and neck cancer ([place]), and Hypertension.     Otilio Carpen Moni Rothrock, PA-C [place] , pager# [phone]

## 2018-11-17 NOTE — Progress Notes (Signed)
10 cc of Versed left from bag wasted with Nira Conn, RN.

## 2018-11-18 ENCOUNTER — Inpatient Hospital Stay (HOSPITAL_COMMUNITY): Payer: Medicaid Other

## 2018-11-18 DIAGNOSIS — F1190 Opioid use, unspecified, uncomplicated: Secondary | ICD-10-CM

## 2018-11-18 LAB — POCT I-STAT 7, (LYTES, BLD GAS, ICA,H+H)
Acid-Base Excess: 12 mmol/L — ABNORMAL HIGH (ref 0.0–2.0)
Bicarbonate: 38.9 mmol/L — ABNORMAL HIGH (ref 20.0–28.0)
Calcium, Ion: 1.17 mmol/L (ref 1.15–1.40)
HCT: 34 % — ABNORMAL LOW (ref 39.0–52.0)
Hemoglobin: 11.6 g/dL — ABNORMAL LOW (ref 13.0–17.0)
O2 Saturation: 95 %
Patient temperature: 98.6
Potassium: 3.5 mmol/L (ref 3.5–5.1)
Sodium: 131 mmol/L — ABNORMAL LOW (ref 135–145)
TCO2: 41 mmol/L — ABNORMAL HIGH (ref 22–32)
pCO2 arterial: 59.4 mmHg — ABNORMAL HIGH (ref 32.0–48.0)
pH, Arterial: 7.424 (ref 7.350–7.450)
pO2, Arterial: 77 mmHg — ABNORMAL LOW (ref 83.0–108.0)

## 2018-11-18 LAB — GLUCOSE, CAPILLARY
Glucose-Capillary: 78 mg/dL (ref 70–99)
Glucose-Capillary: 77 mg/dL (ref 70–99)
Glucose-Capillary: 107 mg/dL — ABNORMAL HIGH (ref 70–99)
Glucose-Capillary: 105 mg/dL — ABNORMAL HIGH (ref 70–99)
Glucose-Capillary: 113 mg/dL — ABNORMAL HIGH (ref 70–99)

## 2018-11-18 LAB — BASIC METABOLIC PANEL
Anion gap: 7 (ref 5–15)
BUN: 14 mg/dL (ref 6–20)
CO2: 37 mmol/L — ABNORMAL HIGH (ref 22–32)
Calcium: 8 mg/dL — ABNORMAL LOW (ref 8.9–10.3)
Chloride: 88 mmol/L — ABNORMAL LOW (ref 98–111)
Creatinine, Ser: 0.37 mg/dL — ABNORMAL LOW (ref 0.61–1.24)
GFR calc Af Amer: >60 mL/min (ref >60)
GFR calc non Af Amer: >60 mL/min (ref >60)
Glucose, Bld: 91 mg/dL (ref 70–99)
Potassium: 3.5 mmol/L (ref 3.5–5.1)
Sodium: 132 mmol/L — ABNORMAL LOW (ref 135–145)

## 2018-11-18 LAB — MAGNESIUM: Magnesium: 1.5 mg/dL — ABNORMAL LOW (ref 1.7–2.4)

## 2018-11-18 LAB — CBC
HCT: 29.6 % — ABNORMAL LOW (ref 39.0–52.0)
Hemoglobin: 9.2 g/dL — ABNORMAL LOW (ref 13.0–17.0)
MCH: 27.6 pg (ref 26.0–34.0)
MCHC: 31.1 g/dL (ref 30.0–36.0)
MCV: 88.9 fL (ref 80.0–100.0)
Platelets: 192 10*3/uL (ref 150–400)
RBC: 3.33 MIL/uL — ABNORMAL LOW (ref 4.22–5.81)
RDW: 13.3 % (ref 11.5–15.5)
WBC: 9.6 10*3/uL (ref 4.0–10.5)
nRBC: 0 % (ref 0.0–0.2)

## 2018-11-18 LAB — PROCALCITONIN: Procalcitonin: 0.12 ng/mL

## 2018-11-18 MED ORDER — ROCURONIUM BROMIDE 10 MG/ML (PF) SYRINGE
PREFILLED_SYRINGE | INTRAVENOUS | Status: AC
  Filled 2018-11-18: qty 10

## 2018-11-18 MED ORDER — NALOXONE HCL 0.4 MG/ML IJ SOLN
INTRAMUSCULAR | Status: AC
  Administered 2018-11-18: 13:00:00 0.4 mg via INTRAVENOUS
  Filled 2018-11-18: qty 1

## 2018-11-18 MED ORDER — ENOXAPARIN SODIUM 40 MG/0.4ML ~~LOC~~ SOLN
40.0000 mg | SUBCUTANEOUS | Status: DC
  Administered 2018-11-19 – 2018-11-20 (×2): 40 mg via SUBCUTANEOUS
  Filled 2018-11-18 (×2): qty 0.4

## 2018-11-18 MED ORDER — FENTANYL CITRATE (PF) 100 MCG/2ML IJ SOLN
INTRAMUSCULAR | Status: AC
  Administered 2018-11-18: 14:00:00 100 ug via INTRAVENOUS
  Filled 2018-11-18: qty 2

## 2018-11-18 MED ORDER — FENTANYL CITRATE (PF) 100 MCG/2ML IJ SOLN
50.0000 ug | INTRAMUSCULAR | Status: DC | PRN
  Administered 2018-11-18 – 2018-11-20 (×5): 100 ug via INTRAVENOUS
  Filled 2018-11-18 (×5): qty 2

## 2018-11-18 MED ORDER — SODIUM CHLORIDE 0.9 % IV SOLN: INTRAVENOUS | Status: DC | PRN

## 2018-11-18 MED ORDER — MIDAZOLAM HCL 2 MG/2ML IJ SOLN
INTRAMUSCULAR | Status: AC
  Administered 2018-11-18: 14:00:00 1 mg via INTRAVENOUS
  Filled 2018-11-18: qty 4

## 2018-11-18 MED ORDER — ETOMIDATE 2 MG/ML IV SOLN
INTRAVENOUS | Status: AC
  Filled 2018-11-18: qty 20

## 2018-11-18 MED ORDER — FENTANYL CITRATE (PF) 100 MCG/2ML IJ SOLN
100.0000 ug | Freq: Once | INTRAMUSCULAR | Status: AC
  Administered 2018-11-18: 14:00:00 100 ug via INTRAVENOUS

## 2018-11-18 MED ORDER — METHADONE HCL 10 MG PO TABS
40.0000 mg | ORAL_TABLET | Freq: Every day | ORAL | Status: DC
  Administered 2018-11-18: 12:00:00 40 mg via ORAL
  Filled 2018-11-18: qty 4

## 2018-11-18 MED ORDER — HYDROMORPHONE HCL 1 MG/ML IJ SOLN: 1.0000 mg | INTRAMUSCULAR | Status: DC | PRN

## 2018-11-18 MED ORDER — SUCCINYLCHOLINE CHLORIDE 200 MG/10ML IV SOSY
PREFILLED_SYRINGE | INTRAVENOUS | Status: AC
  Filled 2018-11-18: qty 10

## 2018-11-18 MED ORDER — FENTANYL CITRATE (PF) 100 MCG/2ML IJ SOLN: 50.0000 ug | INTRAMUSCULAR | Status: DC | PRN

## 2018-11-18 MED ORDER — VECURONIUM BROMIDE 10 MG IV SOLR
INTRAVENOUS | Status: AC
  Filled 2018-11-18: qty 10

## 2018-11-18 MED ORDER — VITAL AF 1.2 CAL PO LIQD
1000.0000 mL | ORAL | Status: DC
  Administered 2018-11-18: 15:00:00 1000 mL
  Administered 2018-11-19: 50 mL

## 2018-11-18 MED ORDER — MIDAZOLAM HCL 2 MG/2ML IJ SOLN
1.0000 mg | Freq: Once | INTRAMUSCULAR | Status: AC
  Administered 2018-11-18: 14:00:00 1 mg via INTRAVENOUS

## 2018-11-18 MED ORDER — STERILE WATER FOR INJECTION IJ SOLN
INTRAMUSCULAR | Status: AC
  Filled 2018-11-18: qty 10

## 2018-11-18 MED ORDER — MAGNESIUM SULFATE 2 GM/50ML IV SOLN
2.0000 g | Freq: Once | INTRAVENOUS | Status: AC
  Administered 2018-11-18: 12:00:00 2 g via INTRAVENOUS
  Filled 2018-11-18: qty 50

## 2018-11-18 MED ORDER — SENNOSIDES-DOCUSATE SODIUM 8.6-50 MG PO TABS
1.0000 | ORAL_TABLET | Freq: Every day | ORAL | Status: DC
  Administered 2018-11-18 – 2018-12-13 (×14): 1
  Filled 2018-11-18 (×16): qty 1

## 2018-11-18 MED ORDER — IPRATROPIUM-ALBUTEROL 0.5-2.5 (3) MG/3ML IN SOLN
3.0000 mL | Freq: Four times a day (QID) | RESPIRATORY_TRACT | Status: DC
  Administered 2018-11-18 – 2018-11-20 (×8): 3 mL via RESPIRATORY_TRACT
  Filled 2018-11-18 (×7): qty 3

## 2018-11-18 MED ORDER — PROPOFOL 10 MG/ML IV BOLUS
INTRAVENOUS | Status: AC
  Filled 2018-11-18: qty 20

## 2018-11-18 MED ORDER — CHLORHEXIDINE GLUCONATE CLOTH 2 % EX PADS
6.0000 | MEDICATED_PAD | Freq: Every day | CUTANEOUS | Status: DC
  Administered 2018-11-19 – 2018-11-28 (×10): 6 via TOPICAL

## 2018-11-18 MED ORDER — NALOXONE HCL 0.4 MG/ML IJ SOLN
0.4000 mg | INTRAMUSCULAR | Status: DC | PRN
  Administered 2018-11-18: 13:00:00 0.4 mg via INTRAVENOUS

## 2018-11-18 NOTE — Progress Notes (Signed)
 Nutrition Follow-up  DOCUMENTATION CODES:   Severe malnutrition in context of chronic illness, Underweight  INTERVENTION:   Tube Feeding:  Vital AF 1.2 at 60 ml/hr Provides 1728 kcals, 108 g of protein and 1166 mL of free water Meets 100% estimated calorie and protein needs   NUTRITION DIAGNOSIS:   Severe Malnutrition related to chronic illness(head and neck cancer with dysphagia and aspiration, COPD) as evidenced by severe fat depletion, severe muscle depletion.  Being addressed via TF   GOAL:   Patient will meet greater than or equal to 90% of their needs  Progressing  MONITOR:   TF tolerance, Vent status, Labs, Weight trends, Skin  REASON FOR ASSESSMENT:   Consult, Ventilator Enteral/tube feeding initiation and management  ASSESSMENT:   58 yo male admitted with acute on chronic respiratory failure secondary to aspiration requiring intubation, septic shock. PMH includes head and neck cancer with dysphagia and aspriation s/p PEG tube, COPD, malnutrition   Plan for extubation today  Tolerating Vital AF 1.2 at 10 ml/hr via G-tube  Weight up to 47.2 kg; admission weight 45 kg; essential;y net neutral; +875 mL  Labs: sodium 132 (L), magnesium 1.5 (L) Meds: reviewed; mag sulfate   Diet Order:   Diet Order            Diet NPO time specified  Diet effective now              EDUCATION NEEDS:   Not appropriate for education at this time  Skin:  Skin Integrity Issues:: Stage II, Stage I Stage I: b/l heels; iliac crest Stage II: sacrum  Last BM:  no documented BM  Height:   Ht Readings from Last 1 Encounters:  11/16/18 5\' 6"  (1.676 m)    Weight:   Wt Readings from Last 1 Encounters:  11/18/18 47.2 kg    Ideal Body Weight:  67.3 kg  BMI:  Body mass index is 16.8 kg/m.  Estimated Nutritional Needs:   Kcal:  1575-1800  Protein:  80-105 g  Fluid:  >/= 1.5 L    Lorilei Horan MS, RDN, LDN, CNSC [phone] Pager  [phone]  Weekend/On-Call Pager

## 2018-11-18 NOTE — Procedures (Signed)
Intubation Procedure Note CAMRAN TIETJEN [id number] Mar 07, 1961  Procedure: Intubation Indications: Respiratory insufficiency  Procedure Details Consent: Unable to obtain consent because of emergent medical necessity. Time Out: Verified patient identification, verified procedure, site/side was marked, verified correct patient position, special equipment/implants available, medications/allergies/relevent history reviewed, required imaging and test results available.  Performed  Maximum sterile technique was used including antiseptics, cap, gloves, hand hygiene, mask and intubation box and N95.  3  Grade II view with anterior airway. ETT visualized passing between vocal cords.  Evaluation Hemodynamic Status: BP stable throughout; O2 sats: hypoxemic to 80s refractory to BVM due to thin, cachectic facial features Patient's Current Condition: stable Complications: No apparent complications Patient did tolerate procedure well. Chest X-ray ordered to verify placement.  CXR: pending.   Rodman Pickle, M.D. [facility] Pulmonary/Critical Care Medicine 11/18/2018 1:48 PM

## 2018-11-18 NOTE — Procedures (Signed)
Extubation Procedure Note  Patient Details:   Name: Tony Berry DOB: 02-13-1961 MRN: [id number]   Airway Documentation:    Vent end date: 11/18/18 Vent end time: 1151   Evaluation  O2 sats: stable throughout Complications: No apparent complications Patient did tolerate procedure well. Bilateral Breath Sounds: Clear, Diminished   Yes   Pt extubated per MD order and placed on 5L bubble humidified oxygen via [place]. Cuff leak heard prior to procedure. RN and RT at bedside. Pt able to speak after ETT removed. A&O x 4. No apparent complications at this time.   Esperanza Sheets T 11/18/2018, 11:53 AM

## 2018-11-18 NOTE — Progress Notes (Signed)
NAME:  Tony Berry, MRN:  [id number], DOB:  [birth year], LOS: 2 ADMISSION DATE:  11/16/2018, CONSULTATION DATE:  8/25 REFERRING MD:  [facility], CHIEF COMPLAINT:  Dypsea   Brief History   58 year old male with head and neck cancer s/p radiation therapy complicated by dysphagia and recurrent aspiration. [name] from [facility] ED where he required intubation in the setting of AMS and hypoxemic respiratory failure.   History of present illness   58 year old male with PMH as below, which is significant for head and neck cancer s/p radiation therapy, COPD, and recurrent aspiration. Followed by Dr. Valeta Harms in pulmonary clinic. He has PEG in place. He has recent admissions to [facility] and [facility] for recurrent aspiration most recently 8/4 when he required intubation. Also developed septic shock requiring pressors. He is supposed to use thickener to hopefully prevent aspiration, however, he has had issues obtaining/using this in the past.   8/25 he became unresponsive at home and his wife called EMS. Upon EMS arrival [name] remained unresponsive. He is on chronic opioids for pain and was given narcan by EMS with some improvement in symptoms, which was transient. Upon arrival to emergency department he was poorly responsive and remained hypoxemic requiring 15L non-rebreather to maintain adequate O2 sats. He was intubated with concern for further deterioration. Imaging demonstrated LLL opacification, which seemed to be consistent with several recent CXR. Started on low dose levophed for shock. Transferred to [facility].  Slowly weaning from Vent.  Past Medical History   has a past medical history of Esophageal cancer ([place]), Head and neck cancer ([name]), and Hypertension.  Significant Hospital Events   8/25 admitted  Consults:  8/26 Palliative Care  Procedures:  ETT 8/25 >  Significant Diagnostic Tests:  8/26 CXR>>increasing LLL opacity  Micro Data:  COVID  [facility]) 8/25 >> NEG Respiratory Culture 8/26>>  Antimicrobials:  Ceftriaxone 8/26>  Interim history/subjective:  Febrile overnight, but ore awake today and successfully weaning from the ventilator  Objective   Blood pressure 115/76, pulse 88, temperature 99 F (37.2 C), temperature source Oral, resp. rate 18, height 5\' 6"  (1.676 m), weight 47.2 kg, SpO2 97 %.    Vent Mode: PSV;CPAP FiO2 (%):  [40 %-50 %] 40 % Set Rate:  [12 bmp] 12 bmp Vt Set:  [380 mL] 380 mL PEEP:  [5 cmH20] 5 cmH20 Pressure Support:  [5 cmH20] 5 cmH20 Plateau Pressure:  [13 cmH20-15 cmH20] 13 cmH20   Intake/Output Summary (Last 24 hours) at 11/18/2018 0831 Last data filed at 11/18/2018 0800 Gross per 24 hour  Intake 1310.34 ml  Output 1145 ml  Net 165.34 ml   Filed Weights   11/16/18 2256 11/17/18 0500 11/18/18 0500  Weight: 45.3 kg 45.3 kg 47.2 kg    General:  Chronically ill, cachectic, intubated  HEENT: MM pink/moist Neuro: Able to lift head off the bed CV: s1s2, regular rate and rhythm, no m/r/g PULM:  Course breath sounds bilaterally GI: soft, bsx4 active  Extremities: warm/dry, no edema  Skin: covered heel pressure ulcers  Resolved Hospital Problem list   Encephalopathy Shock  Assessment & Plan:     Acute on chronic hypoxemic/hypercabric respiratory failure likely secondary to chronic aspiration, high dose Methadone and underlying emphysema -Weaning well from the Vent on PSV 5/5 40% -Tracheal culture and blood cultures pending -Awake and responsive  P: -Plan to extubate today, continue Ceftriaxone -Consult palliative care, spoke with wife, she wants aggressive care,  but pt has specifically stated he did not want a trach in the past    Acute encephalopathy -resolving -likely secondary to high dose methadone and hypercarbic respiratory failure P: -Holding methadone, continue scheduled and prn Oxycodone, likely resume Methadone once extubated, per wife they have been titrating  down, current dose 80mg  qd -Wean Fentanyl gtt -Home Gabapentin and Klonopin continued    Shock -Resolved and pt off Levophed -Suspect was secondary to PNA with high dose narcotics   Hypomagnesemia and prolonged Qtc -Qtc of 602 -Mag repleted with 2g IV yesterday P: -Continue to follow electrolytes and repeat EKG   Chronic Dysphagia and aspiration secondary to head and neck Ca and failure to thrive  -Per wife, pt uses PEG but still drinks liquids po, uses thickener -Completed radiation therapy at [place] approximately two years ago  P: - Tube feeds via PEG -Suction -Consult Palliative Care, pt does not want trach      Best practice:  Diet: Tube feeds Pain/Anxiety/Delirium protocol (if indicated): Fenatnyl infusion. Wean off versed.  VAP protocol (if indicated): Per prot DVT prophylaxis: lovenox GI prophylaxis: PPI Glucose control: SSI Mobility: BR Code Status: Limited  Family Communication: Wife updated via phone Disposition: ICU  Labs   CBC: Recent Labs  Lab 11/13/18 1950 11/17/18 0050 11/17/18 0129  WBC 8.1  --  9.1  NEUTROABS 6.4  --  7.7  HGB 10.6* 10.9* 10.0*  HCT 36.3* 32.0* 32.4*  MCV 95.3  --  90.3  PLT 310  --  0000000    Basic Metabolic Panel: Recent Labs  Lab 11/13/18 1950 11/17/18 0050 11/17/18 0417 11/17/18 1116  NA 133* 132* 132*  --   K 4.1 3.4* 4.1  --   CL 81*  --  85*  --   CO2 43*  --  36*  --   GLUCOSE 86  --  67*  --   BUN 5*  --  11  --   CREATININE 0.43*  --  0.46*  --   CALCIUM 9.0  --  8.2*  --   MG  --   --  1.4* 1.8  PHOS  --   --  2.7  --    GFR: Estimated Creatinine Clearance: 68 mL/min (A) (by C-G formula based on SCr of 0.46 mg/dL (L)). Recent Labs  Lab 11/13/18 1950 11/17/18 0129 11/17/18 0417 11/18/18 0354  PROCALCITON  --   --  0.10 0.12  WBC 8.1 9.1  --   --   LATICACIDVEN  --  1.8 1.7  --     Liver Function Tests: Recent Labs  Lab 11/17/18 0417  AST 27  ALT 10  ALKPHOS 42  BILITOT 1.3*  PROT  5.9*  ALBUMIN 2.0*   No results for input(s): LIPASE, AMYLASE in the last 168 hours. No results for input(s): AMMONIA in the last 168 hours.  ABG    Component Value Date/Time   PHART 7.429 11/17/2018 0050   PCO2ART 70.2 (HH) 11/17/2018 0050   PO2ART 120.0 (H) 11/17/2018 0050   HCO3 46.4 (H) 11/17/2018 0050   TCO2 48 (H) 11/17/2018 0050   O2SAT 99.0 11/17/2018 0050     Coagulation Profile: Recent Labs  Lab 11/17/18 0129  INR 1.2    Cardiac Enzymes: No results for input(s): CKTOTAL, CKMB, CKMBINDEX, TROPONINI in the last 168 hours.  HbA1C: Hgb A1c MFr Bld  Date/Time Value Ref Range Status  10/27/2018 09:58 AM 5.5 4.8 - 5.6 % Final    Comment:    (  NOTE) Pre diabetes:          5.7%-6.4% Diabetes:              >6.4% Glycemic control for   <7.0% adults with diabetes     CBG: Recent Labs  Lab 11/17/18 1523 11/17/18 2034 11/17/18 2346 11/18/18 0425 11/18/18 0805  GLUCAP 75 83 96 78 77    Past Medical History  He,  has a past medical history of Esophageal cancer ([place]), Head and neck cancer ([place]), and Hypertension.     Otilio Carpen Josuel Koeppen, PA-C [place] , pager# [phone]    CRITICAL CARE Performed by: Otilio Carpen Myiesha Edgar   Total critical care time: 33 minutes  Critical care time was exclusive of separately billable procedures and treating other patients.  Critical care was necessary to treat or prevent imminent or life-threatening deterioration, acute respiratory failure requiring ventilator support  Critical care was time spent personally by me on the following activities: development of treatment plan with patient and/or surrogate as well as nursing, discussions with consultants, evaluation of patient's response to treatment, examination of patient, obtaining history from patient or surrogate, ordering and performing treatments and interventions, ordering and review of laboratory studies, ordering and review of radiographic studies, pulse oximetry and  re-evaluation of patient's condition.

## 2018-11-18 NOTE — Progress Notes (Signed)
PCCM Interval  Note:  Pt re-evaluated after extubation and was awake and alert in no distress.  However, shortly thereafter was called to bedside to evaluate patient as he was altered with agonal breathing, oxygen saturations dropped into 80%'s.   Narcan 0.4mg  given without response. Dr. Loanne Drilling also evaluated and pt was re-intubated successfully.  -CXR and ABG pending,  -Continue full vent support  -PRN Fentanyl -Attempted to call pt's wife twice without answer

## 2018-11-19 ENCOUNTER — Inpatient Hospital Stay (HOSPITAL_COMMUNITY): Payer: Medicaid Other

## 2018-11-19 DIAGNOSIS — Z7189 Other specified counseling: Secondary | ICD-10-CM

## 2018-11-19 DIAGNOSIS — J9601 Acute respiratory failure with hypoxia: Secondary | ICD-10-CM

## 2018-11-19 DIAGNOSIS — J156 Pneumonia due to other aerobic Gram-negative bacteria: Secondary | ICD-10-CM

## 2018-11-19 LAB — GLUCOSE, CAPILLARY
Glucose-Capillary: 101 mg/dL — ABNORMAL HIGH (ref 70–99)
Glucose-Capillary: 101 mg/dL — ABNORMAL HIGH (ref 70–99)
Glucose-Capillary: 123 mg/dL — ABNORMAL HIGH (ref 70–99)
Glucose-Capillary: 124 mg/dL — ABNORMAL HIGH (ref 70–99)
Glucose-Capillary: 128 mg/dL — ABNORMAL HIGH (ref 70–99)
Glucose-Capillary: 144 mg/dL — ABNORMAL HIGH (ref 70–99)
Glucose-Capillary: 158 mg/dL — ABNORMAL HIGH (ref 70–99)

## 2018-11-19 LAB — BASIC METABOLIC PANEL
Anion gap: 7 (ref 5–15)
BUN: 18 mg/dL (ref 6–20)
CO2: 38 mmol/L — ABNORMAL HIGH (ref 22–32)
Calcium: 7.9 mg/dL — ABNORMAL LOW (ref 8.9–10.3)
Chloride: 88 mmol/L — ABNORMAL LOW (ref 98–111)
Creatinine, Ser: 0.42 mg/dL — ABNORMAL LOW (ref 0.61–1.24)
GFR calc Af Amer: >60 mL/min (ref >60)
GFR calc non Af Amer: >60 mL/min (ref >60)
Glucose, Bld: 156 mg/dL — ABNORMAL HIGH (ref 70–99)
Potassium: 3.4 mmol/L — ABNORMAL LOW (ref 3.5–5.1)
Sodium: 133 mmol/L — ABNORMAL LOW (ref 135–145)

## 2018-11-19 LAB — CBC
HCT: 30.2 % — ABNORMAL LOW (ref 39.0–52.0)
Hemoglobin: 9.1 g/dL — ABNORMAL LOW (ref 13.0–17.0)
MCH: 27.3 pg (ref 26.0–34.0)
MCHC: 30.1 g/dL (ref 30.0–36.0)
MCV: 90.7 fL (ref 80.0–100.0)
Platelets: 191 10*3/uL (ref 150–400)
RBC: 3.33 MIL/uL — ABNORMAL LOW (ref 4.22–5.81)
RDW: 13.3 % (ref 11.5–15.5)
WBC: 13.5 10*3/uL — ABNORMAL HIGH (ref 4.0–10.5)
nRBC: 0 % (ref 0.0–0.2)

## 2018-11-19 LAB — PHOSPHORUS: Phosphorus: 3.4 mg/dL (ref 2.5–4.6)

## 2018-11-19 LAB — MAGNESIUM: Magnesium: 1.7 mg/dL (ref 1.7–2.4)

## 2018-11-19 MED ORDER — VANCOMYCIN HCL IN DEXTROSE 750-5 MG/150ML-% IV SOLN
750.0000 mg | Freq: Two times a day (BID) | INTRAVENOUS | Status: DC
  Filled 2018-11-19: qty 150

## 2018-11-19 MED ORDER — SODIUM CHLORIDE 0.9 % IV BOLUS
500.0000 mL | Freq: Once | INTRAVENOUS | Status: AC
  Administered 2018-11-19: 10:00:00 500 mL via INTRAVENOUS

## 2018-11-19 MED ORDER — SODIUM CHLORIDE 0.9 % IV SOLN
INTRAVENOUS | Status: DC | PRN
  Administered 2018-11-22: 14:00:00 via INTRAVENOUS

## 2018-11-19 MED ORDER — ACETAMINOPHEN 160 MG/5ML PO SOLN
650.0000 mg | Freq: Four times a day (QID) | ORAL | Status: DC | PRN
  Administered 2018-11-19 – 2018-11-20 (×2): 650 mg
  Filled 2018-11-19 (×2): qty 20.3

## 2018-11-19 MED ORDER — INSULIN ASPART 100 UNIT/ML ~~LOC~~ SOLN
0.0000 [IU] | Freq: Three times a day (TID) | SUBCUTANEOUS | Status: DC
  Administered 2018-11-19: 12:00:00 1 [IU] via SUBCUTANEOUS

## 2018-11-19 MED ORDER — INSULIN ASPART 100 UNIT/ML ~~LOC~~ SOLN
0.0000 [IU] | SUBCUTANEOUS | Status: DC
  Administered 2018-11-20: 21:00:00 2 [IU] via SUBCUTANEOUS
  Administered 2018-11-20: 01:00:00 1 [IU] via SUBCUTANEOUS
  Administered 2018-11-21 (×3): 2 [IU] via SUBCUTANEOUS
  Administered 2018-11-21: 12:00:00 1 [IU] via SUBCUTANEOUS
  Administered 2018-11-21 (×3): 2 [IU] via SUBCUTANEOUS
  Administered 2018-11-22: 04:00:00 1 [IU] via SUBCUTANEOUS
  Administered 2018-11-22: 12:00:00 2 [IU] via SUBCUTANEOUS
  Administered 2018-11-22: 16:00:00 1 [IU] via SUBCUTANEOUS
  Administered 2018-11-22: 08:00:00 2 [IU] via SUBCUTANEOUS

## 2018-11-19 MED ORDER — SODIUM CHLORIDE 0.9 % IV BOLUS
500.0000 mL | Freq: Once | INTRAVENOUS | Status: AC
  Administered 2018-11-19: 08:00:00 500 mL via INTRAVENOUS

## 2018-11-19 MED ORDER — VITAL AF 1.2 CAL PO LIQD
1000.0000 mL | ORAL | Status: DC
  Administered 2018-11-20 – 2018-11-22 (×4): 1000 mL

## 2018-11-19 MED ORDER — VANCOMYCIN HCL IN DEXTROSE 1-5 GM/200ML-% IV SOLN
1000.0000 mg | Freq: Once | INTRAVENOUS | Status: AC
  Administered 2018-11-19: 09:00:00 1000 mg via INTRAVENOUS
  Filled 2018-11-19: qty 200

## 2018-11-19 MED ORDER — SODIUM CHLORIDE 0.9 % IV BOLUS
500.0000 mL | Freq: Once | INTRAVENOUS | Status: AC
  Administered 2018-11-19: 12:00:00 500 mL via INTRAVENOUS

## 2018-11-19 MED ORDER — INSULIN ASPART 100 UNIT/ML ~~LOC~~ SOLN: 0.0000 [IU] | Freq: Every day | SUBCUTANEOUS | Status: DC

## 2018-11-19 MED ORDER — POTASSIUM CHLORIDE 20 MEQ/15ML (10%) PO SOLN
20.0000 meq | ORAL | Status: AC
  Administered 2018-11-19 (×2): 20 meq
  Filled 2018-11-19 (×2): qty 15
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
## 2018-11-19 NOTE — Progress Notes (Signed)
[facility] Progress Note Patient Name: Tony Berry DOB: 1960/11/23 MRN: [id number]   Date of Service  11/19/2018  HPI/Events of Note  K+ 3.4  eICU Interventions  KCL 20 meq via PEG Q 4 hours  X 2 doses        Kerry Kass Chadric Kimberley 11/19/2018, 6:32 AM

## 2018-11-19 NOTE — Progress Notes (Signed)
Nutrition Follow-up  DOCUMENTATION CODES:   Severe malnutrition in context of chronic illness, Underweight  INTERVENTION:   Increase Vital AF 1.2 to 55 ml/hr via g-tube  Tube feeding regimen provides 1589 kcal (99% of needs), 99 grams of protein, and 1071 ml of H2O.   NUTRITION DIAGNOSIS:   Severe Malnutrition related to chronic illness(head and neck cancer with dysphagia and aspiration, COPD) as evidenced by severe fat depletion, severe muscle depletion.  Ongoing  GOAL:   Patient will meet greater than or equal to 90% of their needs  Progressing   MONITOR:   TF tolerance, Vent status, Labs, Weight trends, Skin  REASON FOR ASSESSMENT:   Consult, Ventilator Enteral/tube feeding initiation and management  ASSESSMENT:   58 yo male admitted with acute on chronic respiratory failure secondary to aspiration requiring intubation, septic shock. PMH includes head and neck cancer with dysphagia and aspriation s/p PEG tube, COPD, malnutrition  8/27- extubated and reintubated  Patient is currently intubated on ventilator support MV: 5.5 L/min Temp (24hrs), Avg:99.3 F (37.4 C), Min:96.7 F (35.9 C), Max:101.5 F (38.6 C)  Reviewed I/O's: +548 ml x 24 hours and +1.4 L since admission  UOP: 630 ml x 24 hours  Noted Vital AF 1.2 infusing @ 50 ml/hr via g-tube, which provides 1440 kcals, 90 grams protein, and 973 ml free water, which meets 90% of estimated kcal needs and 100% of estimated protein needs.   Palliative care following for goals of care discussions.   Labs reviewed: Na: 133, K: 3.4. CBGS: 128-158.   Diet Order:   Diet Order            Diet NPO time specified  Diet effective now              EDUCATION NEEDS:   Not appropriate for education at this time  Skin:  Skin Integrity Issues:: Stage II, Stage I Stage I: b/l heels; iliac crest Stage II: sacrum  Last BM:  Unknown  Height:   Ht Readings from Last 1 Encounters:  11/16/18 5\' 6"  (1.676 m)     Weight:   Wt Readings from Last 1 Encounters:  11/19/18 47.4 kg    Ideal Body Weight:  67.3 kg  BMI:  Body mass index is 16.87 kg/m.  Estimated Nutritional Needs:   Kcal:  1601  Protein:  80-95 grams  Fluid:  > 1.6 L    Danija Gosa A. Jimmye Norman, RD, LDN, [place] Registered Dietitian II Certified Diabetes Care and Education Specialist Pager: [phone] After hours Pager: [phone]

## 2018-11-19 NOTE — Progress Notes (Signed)
SLP Cancellation Note  Patient Details Name: Tony Berry MRN: [id number] DOB: 01/26/1961   Cancelled treatment:       Reason Eval/Treat Not Completed: Other (comment). Given reintubation, orders d/c'd. Please reorder when appropriate    Kortney Schoenfelder, Katherene Ponto 11/19/2018, 8:55 AM
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
## 2018-11-19 NOTE — Progress Notes (Signed)
NAME:  Tony Berry, MRN:  [id number], DOB:  1960-10-11, LOS: 3 ADMISSION DATE:  11/16/2018, CONSULTATION DATE:  8/25 REFERRING MD:  [facility], CHIEF COMPLAINT:  [place]   Brief History   58 year old male with head and neck cancer s/p radiation therapy complicated by dysphagia and recurrent aspiration. [name]25 from [facility] ED where he required intubation in the setting of AMS and hypoxemic respiratory failure. Extubated 8/27 and shortly thereafter required re-intubation.   History of present illness   58 year old male with PMH as below, which is significant for head and neck cancer s/p radiation therapy, COPD, and recurrent aspiration. Followed by Dr. Valeta Harms in pulmonary clinic. He has PEG in place. He has recent admissions to [facility] and [facility] for recurrent aspiration most recently 8/4 when he required intubation. Also developed septic shock requiring pressors. He is supposed to use thickener to hopefully prevent aspiration, however, he has had issues obtaining/using this in the past.   8/25 he became unresponsive at home and his wife called EMS. Upon EMS arrival [name] remained unresponsive. He is on chronic opioids for pain and was given narcan by EMS with some improvement in symptoms, which was transient. Upon arrival to emergency department he was poorly responsive and remained hypoxemic requiring 15L non-rebreather to maintain adequate O2 sats. He was intubated with concern for further deterioration. Imaging demonstrated LLL opacification, which seemed to be consistent with several recent CXR. Started on low dose levophed for shock. Transferred to [facility].  He was weaned from the ventilator with improving mental status, but had worsening respiratory distress on 8/27 and required re-intubation.  Past Medical History   has a past medical history of Esophageal cancer ([place]), Head and neck cancer ([place]), and Hypertension.  Significant Hospital Events    8/25 admitted 8/27, extubated then re-intubated  Consults:  8/26 Palliative Care  Procedures:  ETT 8/25 >8/27, 8/27>  Significant Diagnostic Tests:  8/26 CXR>>increasing LLL opacity  Micro Data:  COVID [facility]) 8/25 >> NEG MRSA screen 8/25>>neg Respiratory Culture 8/26>>gm + cocci>>  Antimicrobials:  Ceftriaxone 8/26>  Interim history/subjective:  Re-intubated yesterday afternoon, febrile to 101F overnight with soft BP this morning.  Objective   Blood pressure (!) 86/57, pulse 82, temperature 98.4 F (36.9 C), temperature source Axillary, resp. rate (!) 24, height 5\' 6"  (1.676 m), weight 47.4 kg, SpO2 97 %.    Vent Mode: PRVC FiO2 (%):  [40 %-100 %] 50 % Set Rate:  [12 bmp] 12 bmp Vt Set:  [380 mL] 380 mL PEEP:  [5 cmH20] 5 cmH20 Pressure Support:  [5 cmH20] 5 cmH20 Plateau Pressure:  [13 cmH20-18 cmH20] 16 cmH20   Intake/Output Summary (Last 24 hours) at 11/19/2018 0756 Last data filed at 11/19/2018 0700 Gross per 24 hour  Intake 1178.42 ml  Output 630 ml  Net 548.42 ml   Filed Weights   11/17/18 0500 11/18/18 0500 11/19/18 0500  Weight: 45.3 kg 47.2 kg 47.4 kg    General:  Thin and chronically ill-appearing M, Intubated, no distress HEENT: MM pink/moist Neuro: awake and alert, nodding to questions and moving all extremities CV: s1s2 RRR, no m/r/g PULM:  Decreased breath sounds bilaterally GI: soft, bsx4 active  Extremities: warm/dry, no edema  Skin: no rashes covered heel pressure ulcers   Resolved Hospital Problem list   Encephalopathy Shock  Assessment & Plan:   Acute on chronic hypoxemic/hypercabric respiratory failure likely secondary to chronic aspiration, high dose Methadone  and underlying emphysema -unsuccessful extubation, requiring re-intubation 2/28 -Tracheal culture and blood cultures pending -febrile overnight with increasing leukocytosis P: -Palliative care consult is pending.  After multiple intubations over the last month and  failed extubation this admission need to clarify goals.  - Wife stated pt does not want a trach.  Pt nods to confirm this, but when asked if he wants to be re-intubated if necessary he also confirms -Add Vancomycin given fever/leukocytosis while on Ceftriaxone, recent hospitalization and  trach culture growing Gm positive cocci    Acute encephalopathy secondary to acute on chronic respiratory failure  -improved yesterday, worsens with respiratory distress -Suspect high dose Methadone is contributing to somnolence and respiratory depression P: -Hold methadone, prn Fentanyl and Oxycodone, half dose of 40mg  resumed yesterday and suspect this dose may still be too high -Home Gabapentin and Klonopin continued    Shock -Resolved and pt off Levophed    Hypomagnesemia and prolonged Qtc -Mag repleted and Qtc improved to from 600 to 500 P: -Continue to follow electrolytes    Chronic Dysphagia and aspiration secondary to head and neck Ca and failure to thrive  -Per wife, pt uses PEG but still drinks liquids po, uses thickener -Completed radiation therapy at [facility] approximately two years ago  P: - Tube feeds via PEG -Suction -Consult Palliative Care, pt does not want trach      Best practice:  Diet: Tube feeds Pain/Anxiety/Delirium protocol (if indicated): Fenatnyl infusion. Wean off versed.  VAP protocol (if indicated): Per prot DVT prophylaxis: lovenox GI prophylaxis: PPI Glucose control: SSI Mobility: BR Code Status: Limited  Family Communication:Will attempt to contact pt's wife  Disposition: ICU  Labs   CBC: Recent Labs  Lab 11/13/18 1950 11/17/18 0050 11/17/18 0129 11/18/18 0853 11/18/18 1401 11/19/18 0307  WBC 8.1  --  9.1 9.6  --  13.5*  NEUTROABS 6.4  --  7.7  --   --   --   HGB 10.6* 10.9* 10.0* 9.2* 11.6* 9.1*  HCT 36.3* 32.0* 32.4* 29.6* 34.0* 30.2*  MCV 95.3  --  90.3 88.9  --  90.7  PLT 310  --  243 192  --  [id number]    Basic Metabolic Panel: Recent  Labs  Lab 11/13/18 1950 11/17/18 0050 11/17/18 0417 11/17/18 1116 11/18/18 0853 11/18/18 1401 11/19/18 0307  NA 133* 132* 132*  --  132* 131* 133*  K 4.1 3.4* 4.1  --  3.5 3.5 3.4*  CL 81*  --  85*  --  88*  --  88*  CO2 43*  --  36*  --  37*  --  38*  GLUCOSE 86  --  67*  --  91  --  156*  BUN 5*  --  11  --  14  --  18  CREATININE 0.43*  --  0.46*  --  0.37*  --  0.42*  CALCIUM 9.0  --  8.2*  --  8.0*  --  7.9*  MG  --   --  1.4* 1.8 1.5*  --  1.7  PHOS  --   --  2.7  --   --   --  3.4   GFR: Estimated Creatinine Clearance: 68.3 mL/min (A) (by C-G formula based on SCr of 0.42 mg/dL (L)). Recent Labs  Lab 11/13/18 1950 11/17/18 0129 11/17/18 0417 11/18/18 0354 11/18/18 0853 11/19/18 0307  PROCALCITON  --   --  0.10 0.12  --   --   WBC 8.1 9.1  --   --  9.6 13.5*  LATICACIDVEN  --  1.8 1.7  --   --   --     Liver Function Tests: Recent Labs  Lab 11/17/18 0417  AST 27  ALT 10  ALKPHOS 42  BILITOT 1.3*  PROT 5.9*  ALBUMIN 2.0*   No results for input(s): LIPASE, AMYLASE in the last 168 hours. No results for input(s): AMMONIA in the last 168 hours.  ABG    Component Value Date/Time   PHART 7.424 11/18/2018 1401   PCO2ART 59.4 (H) 11/18/2018 1401   PO2ART 77.0 (L) 11/18/2018 1401   HCO3 38.9 (H) 11/18/2018 1401   TCO2 41 (H) 11/18/2018 1401   O2SAT 95.0 11/18/2018 1401     Coagulation Profile: Recent Labs  Lab 11/17/18 0129  INR 1.2    Cardiac Enzymes: No results for input(s): CKTOTAL, CKMB, CKMBINDEX, TROPONINI in the last 168 hours.  HbA1C: Hgb A1c MFr Bld  Date/Time Value Ref Range Status  10/27/2018 09:58 AM 5.5 4.8 - 5.6 % Final    Comment:    (NOTE) Pre diabetes:          5.7%-6.4% Diabetes:              >6.4% Glycemic control for   <7.0% adults with diabetes     CBG: Recent Labs  Lab 11/18/18 1547 11/18/18 1938 11/19/18 0021 11/19/18 0356 11/19/18 0718  GLUCAP 105* 113* 124* 158* 128*    Past Medical History  He,  has a  past medical history of Esophageal cancer ([place]), Head and neck cancer ([place]), and Hypertension.     Otilio Carpen Wanya Bangura, PA-C [place] , pager# [phone]    CRITICAL CARE Performed by: Otilio Carpen Donterius Filley   Total critical care time: 35 minutes  Critical care time was exclusive of separately billable procedures and treating other patients.  Critical care was necessary to treat or prevent imminent or life-threatening deterioration, acute respiratory failure requiring ventilator support  Critical care was time spent personally by me on the following activities: development of treatment plan with patient and/or surrogate as well as nursing, discussions with consultants, evaluation of patient's response to treatment, examination of patient, obtaining history from patient or surrogate, ordering and performing treatments and interventions, ordering and review of laboratory studies, ordering and review of radiographic studies, pulse oximetry and re-evaluation of patient's condition.

## 2018-11-19 NOTE — Consult Note (Signed)
Consultation Note Date: 11/19/2018   Patient Name: Tony Berry  DOB: July 03, 1960  MRN: [id number]  Age / Sex: 58 y.o., male  PCP: Cher Nakai, MD Referring Physician: Margaretha Seeds, MD  Reason for Consultation: Establishing goals of care  HPI/Patient Profile: 58 y.o. male  with past medical history of esophageal cancer s/p radiation, recurrent aspiration, dysphagia, COPD, and PEG placement admitted on 11/16/2018 with respiratory failure and required intubation. He was extubated 8/27 but required reintubation shortly after. Recently admitted on 8/4 for respiratory failure and required intubation - seen by palliative team during that admission. This admission, imaging reveals LLL opacification. He was required pressors (off as of 8/28 but BP soft). 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 PMT consulted for [place].  Clinical Assessment and Goals of Care: I have reviewed medical records including EPIC notes, labs and imaging, and received report from RN - RN shares that BP remains soft - pressors may be restarted. Patient tolerating SBT this am. No family at bedside.   I then spoke with patient's spouse, [name],  to discuss diagnosis prognosis, GOC, EOL wishes, disposition and options.  [name] is familiar with palliative team from last admission. Spoke with [place] a couple of times regarding goals of care. [name] also shares that she is disabled and unable to come to the hospital - the patient's brother usually comes and visits him.    [name] shares that the patient never returned from baseline since previous admission - she tells me he remained weak and had difficulty breathing while he was home.    We discussed his current illness and what it means in the larger context of his on-going co-morbidities.  Natural disease trajectory and expectations at EOL were discussed. We specifically discussed the patient's dysphagia and recurrent aspiration.  Discussed multiple admissions and intubations d/t pna.   [name] shares how difficult this has been for her - telling me the patient has essentially been in and out of the hospital since November 2019.  I attempted to elicit values and goals of care important to the patient. [name] shares the patient would never want a tracheostomy - she agrees with this decisions. Apparently the patient's mother had a tracheostomy and this is why he feels so strongly about this. We discussed concern about the patient's respiratory function - discussed high probability of continued respiratory failure d/t chronic issues with aspiration. Discussed that intubation is only temporary and does not fix the greater problem. [name] acknowledges this and expresses understanding. She tells me her and the patient have not discussed how he would want to move forward with his care if he became more dependent on the ventilator.   She tells me that as long as the patient is able to make his own decisions she will support whatever he decides. We discussed that currently his mental status is altered and he is unable to participate in conversation. She hopes this improves and that he can potentially communicate to [place] by writing/nodding and make his wishes known.   We discussed having a PMT provider follow up with the patient as his mental status allows to discuss his goals of care. She agrees to this and again shares that she supports whatever he decides. 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   Questions and concerns were addressed. The family was encouraged to call with questions or concerns.   Primary Decision Maker NEXT OF KIN - spouse [name]  Zanetti - patient as mental status allows   SUMMARY OF RECOMMENDATIONS   - initial discussion with spouse - she confirmed that patient would never want a trach and she supports this - however, she has not thought about  the type of care patient would want if he continued to experience respiratory failure  - spouse shares she supports whatever the patient's decisions are and at this point she is unwilling to make decisions for him - she is hopeful he is either extubated and able to participate in [place] conversation or can communicate to [place] with writing/gestures to make his wishes known - she requests PMT provider follow up as his mental status improves - will request PMT member to follow up   Code Status/Advance Care Planning:  Limited code - no CPR/defib  Additional Recommendations (Limitations, Scope, Preferences):  No Tracheostomy  Prognosis:   Unable to determine  Discharge Planning: To Be Determined      Primary Diagnoses: Present on Admission: . Acute on chronic respiratory failure with hypoxia ([place])   I have reviewed the medical record, interviewed the patient and family, and examined the patient. The following aspects are pertinent.  Past Medical History:  Diagnosis Date  . Esophageal cancer ([place])   . Head and neck cancer ([place])   . Hypertension    Social History   Socioeconomic History  . Marital status: Married    Spouse name: Not on file  . Number of children: Not on file  . Years of education: Not on file  . Highest education level: Not on file  Occupational History  . Occupation: Curator    Comment: Retired   Scientific laboratory technician  . Financial resource strain: Not on file  . Food insecurity    Worry: Not on file    Inability: Not on file  . Transportation needs    Medical: Not on file    Non-medical: Not on file  Tobacco Use  . Smoking status: Former Smoker    Packs/day: 1.00    Years: 30.00    Pack years: 30.00    Types: Cigarettes    Start date: 03/11/1988    Quit date: 02/25/2018    Years since quitting: 0.7  . Smokeless tobacco: Former Network engineer and Sexual Activity  . Alcohol use: Yes    Comment: 30 years ago   . Drug use: No  . Sexual activity: Not on file   Lifestyle  . Physical activity    Days per week: Not on file    Minutes per session: Not on file  . Stress: Not on file  Relationships  . Social Herbalist on phone: Not on file    Gets together: Not on file    Attends religious service: Not on file    Active member of club or organization: Not on file    Attends meetings of clubs or organizations: Not on file    Relationship status: Not on file  Other Topics Concern  . Not on file  Social History Narrative  . Not on file   Family History  Problem Relation Age of Onset  . COPD Mother   . Lung cancer Mother        on HOT   . COPD Father   . Pneumonia Father   . CAD Father        CABG   . COPD Sister   . Colon cancer Neg Hx    Scheduled Meds: . arformoterol  15 mcg Nebulization BID  . budesonide (PULMICORT) nebulizer solution  0.5 mg Nebulization BID  . chlorhexidine gluconate (MEDLINE KIT)  15 mL Mouth Rinse BID  . Chlorhexidine Gluconate Cloth  6 each Topical Daily  . clonazePAM  1 mg Per Tube Q12H  . enoxaparin (LOVENOX) injection  40 mg Subcutaneous Q24H  . feeding supplement (PRO-STAT SUGAR FREE 64)  30 mL Per Tube BID  . feeding supplement (VITAL AF 1.2 CAL)  1,000 mL Per Tube Q24H  . gabapentin  100 mg Per Tube Q8H  . insulin aspart  0-5 Units Subcutaneous QHS  . insulin aspart  0-9 Units Subcutaneous TID WC  . ipratropium-albuterol  3 mL Nebulization Q6H  . mouth rinse  15 mL Mouth Rinse 10 times per day  . pantoprazole sodium  40 mg Per Tube QHS  . senna-docusate  1 tablet Per Tube Daily   Continuous Infusions: . sodium chloride 10 mL/hr at 11/19/18 0800  . cefTRIAXone (ROCEPHIN)  IV Stopped (11/19/18 1103)  . vancomycin     PRN Meds:.sodium chloride, acetaminophen (TYLENOL) oral liquid 160 mg/5 mL, docusate, fentaNYL (SUBLIMAZE) injection, fentaNYL (SUBLIMAZE) injection, HYDROmorphone (DILAUDID) injection, naLOXone (NARCAN)  injection, ondansetron (ZOFRAN) IV, oxyCODONE No Known Allergies   Vital Signs: BP (!) 93/52   Pulse 92   Temp 99 F (37.2 C) (Oral)   Resp 18   Ht '5\' 6"'  (1.676 m)   Wt 47.4 kg   SpO2 95%   BMI 16.87 kg/m  Pain Scale: CPOT   Pain Score: Asleep   SpO2: SpO2: 95 % O2 Device:SpO2: 95 % O2 Flow Rate: .O2 Flow Rate (L/min): 5 L/min  IO: Intake/output summary:   Intake/Output Summary (Last 24 hours) at 11/19/2018 1524 Last data filed at 11/19/2018 1400 Gross per 24 hour  Intake 3780.44 ml  Output 555 ml  Net 3225.44 ml    LBM: Last BM Date: (PTA) Baseline Weight: Weight: 45.3 kg Most recent weight: Weight: 47.4 kg     Palliative Assessment/Data: PPS 40%    The above conversation was completed via telephone due to the visitor restrictions during the COVID-19 pandemic. Thorough chart review and discussion with necessary members of the care team was completed as part of assessment. All issues were discussed and addressed but no physical exam was performed.  Time Total: 50 minutes Greater than 50%  of this time was spent counseling and coordinating care related to the above assessment and plan.  Juel Burrow, DNP, AGNP-C Palliative Medicine Team [phone] Pager: [phone]

## 2018-11-20 ENCOUNTER — Inpatient Hospital Stay (HOSPITAL_COMMUNITY): Payer: Medicaid Other

## 2018-11-20 LAB — POCT I-STAT 7, (LYTES, BLD GAS, ICA,H+H)
Acid-Base Excess: 16 mmol/L — ABNORMAL HIGH (ref 0.0–2.0)
Bicarbonate: 40.8 mmol/L — ABNORMAL HIGH (ref 20.0–28.0)
Calcium, Ion: 1.22 mmol/L (ref 1.15–1.40)
HCT: 28 % — ABNORMAL LOW (ref 39.0–52.0)
Hemoglobin: 9.5 g/dL — ABNORMAL LOW (ref 13.0–17.0)
O2 Saturation: 98 %
Patient temperature: 98.6
Potassium: 4.1 mmol/L (ref 3.5–5.1)
Sodium: 139 mmol/L (ref 135–145)
TCO2: 42 mmol/L — ABNORMAL HIGH (ref 22–32)
pCO2 arterial: 48 mmHg (ref 32.0–48.0)
pH, Arterial: 7.537 — ABNORMAL HIGH (ref 7.350–7.450)
pO2, Arterial: 100 mmHg (ref 83.0–108.0)

## 2018-11-20 LAB — BASIC METABOLIC PANEL
Anion gap: 6 (ref 5–15)
BUN: 15 mg/dL (ref 6–20)
CO2: 35 mmol/L — ABNORMAL HIGH (ref 22–32)
Calcium: 8 mg/dL — ABNORMAL LOW (ref 8.9–10.3)
Chloride: 95 mmol/L — ABNORMAL LOW (ref 98–111)
Creatinine, Ser: 0.34 mg/dL — ABNORMAL LOW (ref 0.61–1.24)
GFR calc Af Amer: >60 mL/min (ref >60)
GFR calc non Af Amer: >60 mL/min (ref >60)
Glucose, Bld: 103 mg/dL — ABNORMAL HIGH (ref 70–99)
Potassium: 3.9 mmol/L (ref 3.5–5.1)
Sodium: 136 mmol/L (ref 135–145)

## 2018-11-20 LAB — GLUCOSE, CAPILLARY
Glucose-Capillary: 96 mg/dL (ref 70–99)
Glucose-Capillary: 106 mg/dL — ABNORMAL HIGH (ref 70–99)
Glucose-Capillary: 98 mg/dL (ref 70–99)
Glucose-Capillary: 117 mg/dL — ABNORMAL HIGH (ref 70–99)
Glucose-Capillary: 157 mg/dL — ABNORMAL HIGH (ref 70–99)

## 2018-11-20 LAB — CBC
HCT: 29.8 % — ABNORMAL LOW (ref 39.0–52.0)
Hemoglobin: 8.9 g/dL — ABNORMAL LOW (ref 13.0–17.0)
MCH: 27.6 pg (ref 26.0–34.0)
MCHC: 29.9 g/dL — ABNORMAL LOW (ref 30.0–36.0)
MCV: 92.3 fL (ref 80.0–100.0)
Platelets: 182 10*3/uL (ref 150–400)
RBC: 3.23 MIL/uL — ABNORMAL LOW (ref 4.22–5.81)
RDW: 13.5 % (ref 11.5–15.5)
WBC: 8.5 10*3/uL (ref 4.0–10.5)
nRBC: 0 % (ref 0.0–0.2)

## 2018-11-20 MED ORDER — ROCURONIUM BROMIDE 10 MG/ML (PF) SYRINGE
PREFILLED_SYRINGE | INTRAVENOUS | Status: AC
  Administered 2018-11-20: 15:00:00 50 mg
  Filled 2018-11-20: qty 10

## 2018-11-20 MED ORDER — FENTANYL 2500MCG IN NS 250ML (10MCG/ML) PREMIX INFUSION
0.0000 ug/h | INTRAVENOUS | Status: DC
  Administered 2018-11-20: 17:00:00 25 ug/h via INTRAVENOUS
  Administered 2018-11-22: 100 ug/h via INTRAVENOUS
  Filled 2018-11-20 (×2): qty 250

## 2018-11-20 MED ORDER — ETOMIDATE 2 MG/ML IV SOLN
INTRAVENOUS | Status: AC
  Administered 2018-11-20: 10 mg
  Filled 2018-11-20: qty 10

## 2018-11-20 MED ORDER — ACETAMINOPHEN 160 MG/5ML PO SOLN
650.0000 mg | Freq: Four times a day (QID) | ORAL | Status: DC
  Administered 2018-11-20 – 2018-12-13 (×90): 650 mg
  Filled 2018-11-20 (×91): qty 20.3

## 2018-11-20 MED ORDER — METHADONE HCL 40 MG PO TBSO: 40.0000 mg | ORAL_TABLET | Freq: Every day | ORAL | Status: DC

## 2018-11-20 MED ORDER — MAGIC MOUTHWASH W/LIDOCAINE
5.0000 mL | Freq: Three times a day (TID) | ORAL | Status: DC
  Administered 2018-11-20: 5 mL via ORAL
  Filled 2018-11-20 (×3): qty 5

## 2018-11-20 MED ORDER — DEXMEDETOMIDINE HCL IN NACL 400 MCG/100ML IV SOLN
0.4000 ug/kg/h | INTRAVENOUS | Status: DC
  Administered 2018-11-20: 19:00:00 0.4 ug/kg/h via INTRAVENOUS
  Administered 2018-11-21 (×2): 1 ug/kg/h via INTRAVENOUS
  Administered 2018-11-22 (×2): 1.2 ug/kg/h via INTRAVENOUS
  Filled 2018-11-20 (×6): qty 100

## 2018-11-20 MED ORDER — DEXAMETHASONE SODIUM PHOSPHATE 4 MG/ML IJ SOLN
4.0000 mg | Freq: Four times a day (QID) | INTRAMUSCULAR | Status: AC
  Administered 2018-11-20 – 2018-11-22 (×7): 4 mg via INTRAVENOUS
  Filled 2018-11-20 (×8): qty 1

## 2018-11-20 MED ORDER — RACEPINEPHRINE HCL 2.25 % IN NEBU
INHALATION_SOLUTION | RESPIRATORY_TRACT | Status: AC
  Administered 2018-11-20: 12:00:00 0.5 mL
  Filled 2018-11-20: qty 0.5

## 2018-11-20 MED ORDER — IPRATROPIUM-ALBUTEROL 0.5-2.5 (3) MG/3ML IN SOLN
3.0000 mL | Freq: Four times a day (QID) | RESPIRATORY_TRACT | Status: DC
  Administered 2018-11-20 – 2018-11-22 (×8): 3 mL via RESPIRATORY_TRACT
  Filled 2018-11-20 (×8): qty 3

## 2018-11-20 NOTE — Procedures (Addendum)
Intubation Procedure Note JARETH RUHMANN [id number] 05/05/1960  Procedure: Intubation Indications: Respiratory insufficiency  Procedure Details Consent: Unable to obtain consent because of emergent medical necessity. (called wife but no answer, no voice mail. )  Time Out: Verified patient identification, verified procedure, site/side was marked, verified correct patient position, special equipment/implants available, medications/allergies/relevent history reviewed, required imaging and test results available.  Performed   Glidescope used :  ETT visualized passing between vocal cords.   Evaluation Hemodynamic Status: BP stable throughout; O2 sats: 94-96% throughout  Patient's Current Condition: stable Complications: No apparent complications Patient did tolerate procedure well. Chest X-ray ordered to verify placement.  CXR: pending.     Braedin Millhouse NP-C  [facility]  [phone]  11/20/2018

## 2018-11-20 NOTE — Progress Notes (Signed)
Upon entry of pt's room, RT noticed pt had a change in appearance. WOB noted, diminished breath sounds bilaterally L > R. Pt recently extubated this am, sounds of possible stridor heard. Racemic Neb and Duo given. RT, RN and MD at bedside. Pt placed on Bi-PAP mode AVAPS. Setting vT 500 RR 8 EPAP 4 MIN P: 10 MAX P: 30. vT's currently between 400-500. Pt is 100% triggering breaths. VE ranging between 4.0-6.0. PIP 25. RT will continue to monitor pt.

## 2018-11-20 NOTE — Progress Notes (Signed)
NAME:  Tony Berry, MRN:  [id number], DOB:  [birth year], LOS: 4 ADMISSION DATE:  11/16/2018, CONSULTATION DATE:  8/25 REFERRING MD:  [facility], CHIEF COMPLAINT:  Dypsea   Brief History   58 year old male with head and neck cancer s/p radiation therapy complicated by dysphagia and recurrent aspiration. [name]25 from [facility] ED where he required intubation in the setting of AMS and hypoxemic respiratory failure. Extubated 8/27 and shortly thereafter required re-intubation.   History of present illness   59 year old male with PMH as below, which is significant for head and neck cancer s/p radiation therapy, COPD, and recurrent aspiration. Followed by Dr. Valeta Harms in pulmonary clinic. He has PEG in place. He has recent admissions to [facility] and [facility] for recurrent aspiration most recently 8/4 when he required intubation. Also developed septic shock requiring pressors. He is supposed to use thickener to hopefully prevent aspiration, however, he has had issues obtaining/using this in the past.   8/25 he became unresponsive at home and his wife called EMS. Upon EMS arrival Tony Berry remained unresponsive. He is on chronic opioids for pain and was given narcan by EMS with some improvement in symptoms, which was transient. Upon arrival to emergency department he was poorly responsive and remained hypoxemic requiring 15L non-rebreather to maintain adequate O2 sats. He was intubated with concern for further deterioration. Imaging demonstrated LLL opacification, which seemed to be consistent with several recent CXR. Started on low dose levophed for shock. Transferred to [facility].  He was weaned from the ventilator with improving mental status, but had worsening respiratory distress on 8/27 and required re-intubation.  Past Medical History   has a past medical history of Esophageal cancer ([place]), Head and neck cancer ([place]), and Hypertension.  Significant Hospital Events    8/25 admitted 8/27, extubated then re-intubated  Consults:  8/26 Palliative Care  Procedures:  ETT 8/25 >8/27, 8/27>  Significant Diagnostic Tests:  8/26 CXR>>increasing LLL opacity  Micro Data:  COVID [facility]) 8/25 >> NEG MRSA screen 8/25>>neg Respiratory Culture 8/26>>gm + cocci>>  Antimicrobials:  Ceftriaxone 8/26>  Interim history/subjective:  No events, awake, wants tube out.  Diffuse chronic pain.  Objective   Blood pressure (!) 99/53, pulse 100, temperature 99 F (37.2 C), temperature source Oral, resp. rate (!) 22, height 5\' 6"  (1.676 m), weight 49.1 kg, SpO2 94 %.    Vent Mode: PSV;CPAP FiO2 (%):  [40 %] 40 % Set Rate:  [12 bmp] 12 bmp Vt Set:  [380 mL] 380 mL PEEP:  [5 cmH20] 5 cmH20 Pressure Support:  [5 cmH20] 5 cmH20 Plateau Pressure:  [12 cmH20-15 cmH20] 15 cmH20   Intake/Output Summary (Last 24 hours) at 11/20/2018 0901 Last data filed at 11/20/2018 0800 Gross per 24 hour  Intake 3584.48 ml  Output 725 ml  Net 2859.48 ml   Filed Weights   11/18/18 0500 11/19/18 0500 11/20/18 0300  Weight: 47.2 kg 47.4 kg 49.1 kg    GEN: middle aged man in NAD HEENT: ETT in place with small-moderate secretions CV: RRR, ext warm PULM: Scattered rhonci on L, no accessory muscle use GI: PEG in place, soft EXT: No edema NEURO: moves all 4 ext briskly to command PSYCH: RASS 0 SKIN: No rashes    Resolved Hospital Problem list   Encephalopathy Shock  Assessment & Plan:   # Acute on chronic hypoxemic/hypercabric respiratory failure likely secondary to acute serratia pneumonia and chronic aspiration syndrome in patient with  underlying COPD and chronic opiate use. # Protein calorie malnutrition- moderate to severe # Hx head and neck cancer causing dysphagia and PEG dependence - Stop all sedating meds, can do trial of methadone at severely reduced dose later today, maximize nonopiate options - Extubate, BIPAP qHS - OOB to chair, aggressive CPT - Trickle feeds  later today - Would probably do very well with trach and nocturnal vent use based on my eval, will address with him and wife if he fails again  Best practice:  Diet: Tube feeds Pain/Anxiety/Delirium protocol (if indicated): stop all VAP protocol (if indicated): Per prot DVT prophylaxis: lovenox GI prophylaxis: PPI Glucose control: SSI Mobility: BR Code Status: Limited - no CPR or shocks Family Communication:will call later today Disposition: ICU   The patient is critically ill with multiple organ systems failure and requires high complexity decision making for assessment and support, frequent evaluation and titration of therapies, application of advanced monitoring technologies and extensive interpretation of multiple databases. Critical Care Time devoted to patient care services described in this note independent of APP/resident time (if applicable)  is 40 minutes.   Erskine Emery MD [facility] 11/20/18 9:07 AM Personal pager: [phone] If unanswered, please page CCM On-call: [phone]

## 2018-11-20 NOTE — Progress Notes (Signed)
Daily Progress Note   Patient Name: Tony Berry       Date: 11/20/2018 DOB: Nov 18, 1960  Age: 58 y.o. MRN#: [id number] Attending Physician: Candee Furbish, MD Primary Care Physician: Cher Nakai, MD Admit Date: 11/16/2018  Reason for Consultation/Follow-up: Establishing goals of care  Subjective: I saw and examined Tony Berry this afternoon.  On entering room, he was on Bipap.  He would intermittently make eye contact and seemed to nod intermittently to simple questions but was not able to participate in any real conversation regarding goals.  I called and spoke with his wife, [name].  We reviewed that he was extubated, but now on Bipap.  Expressed concern that he may need to be reintubated if he continued to worsen.  She reports understanding but is hopeful he will stabilize and be able to make his wishes known.  Reports again that she supports whatever decisions he makes, but she wants to see if he I going to be able to make his own decisions.    She states that he has said that he would never want trach, but that she has discussed further with providers and, if it is possible that a trach may be reversible at some point in the future, she feels that this may cause him to reconsider.  Length of Stay: 4  Current Medications: Scheduled Meds:  . acetaminophen (TYLENOL) oral liquid 160 mg/5 mL  650 mg Per Tube Q6H  . arformoterol  15 mcg Nebulization BID  . budesonide (PULMICORT) nebulizer solution  0.5 mg Nebulization BID  . chlorhexidine gluconate (MEDLINE KIT)  15 mL Mouth Rinse BID  . Chlorhexidine Gluconate Cloth  6 each Topical Daily  . dexamethasone (DECADRON) injection  4 mg Intravenous Q6H  . enoxaparin (LOVENOX) injection  40 mg Subcutaneous Q24H  . feeding supplement (PRO-STAT  SUGAR FREE 64)  30 mL Per Tube BID  . feeding supplement (VITAL AF 1.2 CAL)  1,000 mL Per Tube Q24H  . gabapentin  100 mg Per Tube Q8H  . insulin aspart  0-9 Units Subcutaneous Q4H  . ipratropium-albuterol  3 mL Nebulization QID  . mouth rinse  15 mL Mouth Rinse 10 times per day  . pantoprazole sodium  40 mg Per Tube QHS  . senna-docusate  1 tablet Per Tube Daily  Continuous Infusions: . sodium chloride Stopped (11/20/18 0853)  . sodium chloride Stopped (11/19/18 2104)  . cefTRIAXone (ROCEPHIN)  IV Stopped (11/20/18 2993)  . dexmedetomidine (PRECEDEX) IV infusion 0.4 mcg/kg/hr (11/20/18 2200)  . fentaNYL infusion INTRAVENOUS 25 mcg/hr (11/20/18 2200)    PRN Meds: sodium chloride, sodium chloride, docusate, naLOXone (NARCAN)  injection, ondansetron (ZOFRAN) IV  Physical Exam         General: Somewhat lethargic.  On Bipap. Heart: Regular rate and rhythm. No murmur appreciated. Lungs: Good air movement w/ Bipap Abdomen: Soft, nontender, nondistended, positive bowel sounds.  Ext: No significant edema Skin: Warm and dry  Vital Signs: BP 99/62   Pulse 86   Temp 100.1 F (37.8 C) (Oral)   Resp 20   Ht '5\' 6"'  (1.676 m)   Wt 49.1 kg   SpO2 98%   BMI 17.47 kg/m  SpO2: SpO2: 98 % O2 Device: O2 Device: Ventilator O2 Flow Rate: O2 Flow Rate (L/min): 4 L/min  Intake/output summary:   Intake/Output Summary (Last 24 hours) at 11/20/2018 2243 Last data filed at 11/20/2018 2200 Gross per 24 hour  Intake 1698.17 ml  Output 1615 ml  Net 83.17 ml   LBM: Last BM Date: (PTA) Baseline Weight: Weight: 45.3 kg Most recent weight: Weight: 49.1 kg       Palliative Assessment/Data:    Flowsheet Rows     Most Recent Value  Intake Tab  Referral Department  Critical care  Unit at Time of Referral  ICU  Palliative Care Primary Diagnosis  Cancer  Date Notified  11/17/18  Palliative Care Type  Return patient Palliative Care  Reason for referral  Clarify Goals of Care  Date of  Admission  11/16/18  Date first seen by Palliative Care  11/19/18  # of days Palliative referral response time  2 Day(s)  # of days IP prior to Palliative referral  1  Clinical Assessment  Palliative Performance Scale Score  40%  Psychosocial & Spiritual Assessment  Palliative Care Outcomes  Patient/Family meeting held?  Yes  Who was at the meeting?  spouse  Palliative Care Outcomes  Clarified goals of care, Provided psychosocial or spiritual support      Patient Active Problem List   Diagnosis Date Noted  . Endotracheally intubated   . Head and neck cancer ([place])   . S/P percutaneous endoscopic gastrostomy (PEG) tube placement ([place])   . Cachexia ([place])   . Failure to thrive in adult   . Hypotension due to drugs   . Acute on chronic respiratory failure with hypoxia ([place]) 11/16/2018  . Encounter for imaging study to confirm orogastric (OG) tube placement   . History of ETT   . Palliative care by specialist   . Acute on chronic respiratory failure with hypoxia and hypercapnia ([place]) 10/26/2018  . Pressure injury of skin 10/26/2018  . Aspiration into airway 10/26/2018  . History of head and neck cancer 10/26/2018  . Fall at home, initial encounter 09/28/2018  . Goals of care, counseling/discussion   . DNR (do not resuscitate) discussion   . Chest discomfort 05/06/2018  . Dysphagia 04/08/2018  . Aspiration pneumonia ([place]) 04/04/2018  . Acute on chronic respiratory failure ([place]) 03/15/2018  . Carditis   . Troponin level elevated 03/11/2018  . Protein-calorie malnutrition, severe 03/11/2018  . Hyponatremia 03/11/2018  . Hypophosphatemia 03/11/2018  . Multifocal pneumonia 03/10/2018    Palliative Care Assessment & Plan   Patient Profile: 59 y.o. male  with past medical history of  esophageal cancer s/p radiation, recurrent aspiration, dysphagia, COPD, and PEG placement admitted on 11/16/2018 with respiratory failure and required intubation. He was extubated 8/27 but required  reintubation shortly after. Recently admitted on 8/4 for respiratory failure and required intubation - seen by palliative team during that admission. This admission, imaging reveals LLL opacification. He was required pressors (off as of 8/28 but BP soft). 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 PMT consulted for [place].  Assessment: Patient Active Problem List   Diagnosis Date Noted  . Endotracheally intubated   . Head and neck cancer ([place])   . S/P percutaneous endoscopic gastrostomy (PEG) tube placement ([place])   . Cachexia ([place])   . Failure to thrive in adult   . Hypotension due to drugs   . Acute on chronic respiratory failure with hypoxia ([place]) 11/16/2018  . Encounter for imaging study to confirm orogastric (OG) tube placement   . History of ETT   . Palliative care by specialist   . Acute on chronic respiratory failure with hypoxia and hypercapnia ([place]) 10/26/2018  . Pressure injury of skin 10/26/2018  . Aspiration into airway 10/26/2018  . History of head and neck cancer 10/26/2018  . Fall at home, initial encounter 09/28/2018  . Goals of care, counseling/discussion   . DNR (do not resuscitate) discussion   . Chest discomfort 05/06/2018  . Dysphagia 04/08/2018  . Aspiration pneumonia ([place]) 04/04/2018  . Acute on chronic respiratory failure ([place]) 03/15/2018  . Carditis   . Troponin level elevated 03/11/2018  . Protein-calorie malnutrition, severe 03/11/2018  . Hyponatremia 03/11/2018  . Hypophosphatemia 03/11/2018  . Multifocal pneumonia 03/10/2018    Recommendations/Plan:  Continue current care  Patient's wife remains hopeful that he will reach a point where he is able to participate in [place] conversation.  Palliative to f/u tomorrow.  Code Status:    Code Status Orders  (From admission, onward)         Start     Ordered   11/17/18 0017  Limited resuscitation (code)  Continuous    Question Answer Comment  In the event of cardiac or respiratory ARREST: Initiate  Code Blue, Call Rapid Response No   In the event of cardiac or respiratory ARREST: Perform CPR No   In the event of cardiac or respiratory ARREST: Perform Intubation/Mechanical Ventilation Yes   In the event of cardiac or respiratory ARREST: Use NIPPV/BiPAp only if indicated Yes   In the event of cardiac or respiratory ARREST: Administer ACLS medications if indicated Yes   In the event of cardiac or respiratory ARREST: Perform Defibrillation or Cardioversion if indicated No      11/17/18 0016        Code Status History    Date Active Date Inactive Code Status Order ID Comments User Context   11/16/2018 2342 11/17/2018 0016 Full Code [id number]  Kandice Hams, MD Inpatient   10/26/2018 1946 10/31/2018 1706 Full Code [id number]  Roxanne Mins, MD Inpatient   09/29/2018 0110 10/02/2018 1245 Partial Code [id number]  Toy Baker, MD Inpatient   04/04/2018 1449 04/12/2018 1448 Partial Code [id number]  Ina Homes, MD Inpatient   04/04/2018 1242 04/04/2018 1439 DNR [id number]  Ina Homes, MD ED   03/10/2018 1806 03/15/2018 2047 DNR [id number]  Katherine Roan, MD ED   Advance Care Planning Activity       Prognosis:   Guarded  Discharge Planning:  To Be Determined  Care plan was discussed with wife  Thank you for allowing the  Palliative Medicine Team to assist in the care of this patient.   Time In: 1300 Time Out: 1345 Total Time 45 Prolonged Time Billed No      Greater than 50%  of this time was spent counseling and coordinating care related to the above assessment and plan.  Micheline Rough, MD  Please contact Palliative Medicine Team phone at [phone] for questions and concerns.

## 2018-11-20 NOTE — Procedures (Signed)
Extubation Procedure Note  Patient Details:   Name: Tony Berry DOB: October 26, 1960 MRN: [id number]   Airway Documentation:    Vent end date: 11/20/18 Vent end time: 1014   Evaluation  O2 sats: stable throughout Complications: No apparent complications Patient did tolerate procedure well. Bilateral Breath Sounds: Clear, Diminished   Yes   Pt extubated per MD order and placed on 4L humidified [place]. Cuff leak heard prior to extubation. Bilateral breath sounds clear/diminished. RN and RT at bedside. ETT removed, pt is able to speak. Pt has complaints of throat pain, MD aware and will treat. Bi-PAP PRN on standby at bedside. RT will continue to monitor.   Esperanza Sheets T 11/20/2018, 10:20 AM

## 2018-11-20 NOTE — Progress Notes (Signed)
RT note- ABG obtained prior to intubation. PH-7.122 Pco2-97, Po2-152. All other values undetectable, however Dr. Tamala Julian was on the unit, decision was made to intubate due to patient's current disposition.

## 2018-11-21 ENCOUNTER — Inpatient Hospital Stay (HOSPITAL_COMMUNITY): Payer: Medicaid Other

## 2018-11-21 LAB — CULTURE, RESPIRATORY W GRAM STAIN: Special Requests: NORMAL

## 2018-11-21 LAB — GLUCOSE, CAPILLARY
Glucose-Capillary: 134 mg/dL — ABNORMAL HIGH (ref 70–99)
Glucose-Capillary: 151 mg/dL — ABNORMAL HIGH (ref 70–99)
Glucose-Capillary: 156 mg/dL — ABNORMAL HIGH (ref 70–99)
Glucose-Capillary: 163 mg/dL — ABNORMAL HIGH (ref 70–99)
Glucose-Capillary: 165 mg/dL — ABNORMAL HIGH (ref 70–99)
Glucose-Capillary: 183 mg/dL — ABNORMAL HIGH (ref 70–99)
Glucose-Capillary: 194 mg/dL — ABNORMAL HIGH (ref 70–99)

## 2018-11-21 MED ORDER — OXYCODONE HCL 5 MG PO TABS
10.0000 mg | ORAL_TABLET | ORAL | Status: DC | PRN
  Administered 2018-11-21 – 2018-11-22 (×6): 10 mg via ORAL
  Filled 2018-11-21 (×6): qty 2

## 2018-11-21 MED ORDER — FENTANYL CITRATE (PF) 100 MCG/2ML IJ SOLN
50.0000 ug | INTRAMUSCULAR | Status: DC | PRN
  Administered 2018-11-21 – 2018-11-24 (×4): 50 ug via INTRAVENOUS
  Filled 2018-11-21 (×3): qty 2

## 2018-11-21 MED ORDER — FENTANYL CITRATE (PF) 100 MCG/2ML IJ SOLN
200.0000 ug | Freq: Once | INTRAMUSCULAR | Status: DC
  Filled 2018-11-21: qty 4

## 2018-11-21 MED ORDER — ETOMIDATE 2 MG/ML IV SOLN
20.0000 mg | Freq: Once | INTRAVENOUS | Status: AC
  Administered 2018-11-21: 12:00:00 20 mg via INTRAVENOUS

## 2018-11-21 MED ORDER — ENOXAPARIN SODIUM 40 MG/0.4ML ~~LOC~~ SOLN
40.0000 mg | SUBCUTANEOUS | Status: DC
  Administered 2018-11-22 – 2018-11-25 (×4): 40 mg via SUBCUTANEOUS
  Filled 2018-11-21 (×4): qty 0.4

## 2018-11-21 MED ORDER — MIDAZOLAM HCL 2 MG/2ML IJ SOLN
5.0000 mg | Freq: Once | INTRAMUSCULAR | Status: AC
  Administered 2018-11-21: 12:00:00 2 mg via INTRAVENOUS
  Filled 2018-11-21: qty 6

## 2018-11-21 MED ORDER — ROCURONIUM BROMIDE 50 MG/5ML IV SOLN
50.0000 mg | Freq: Once | INTRAVENOUS | Status: AC
  Administered 2018-11-21: 12:00:00 50 mg via INTRAVENOUS
  Filled 2018-11-21: qty 5

## 2018-11-21 MED ORDER — FENTANYL BOLUS VIA INFUSION
100.0000 ug | Freq: Once | INTRAVENOUS | Status: AC
  Administered 2018-11-21: 100 ug via INTRAVENOUS

## 2018-11-21 NOTE — Progress Notes (Addendum)
NAME:  Tony Berry, MRN:  [id number], DOB:  1960/09/23, LOS: 5 ADMISSION DATE:  11/16/2018, CONSULTATION DATE:  8/25 REFERRING MD:  [facility], CHIEF COMPLAINT:  [place]   Brief History   58 year old male with head and neck cancer s/p radiation therapy complicated by dysphagia and recurrent aspiration. [name]25 from [facility] ED where he required intubation in the setting of AMS and hypoxemic respiratory failure. Extubated 8/27 and shortly thereafter required re-intubation.   History of present illness   58 year old male with PMH as below, which is significant for head and neck cancer s/p radiation therapy, COPD, and recurrent aspiration. Followed by Dr. Valeta Harms in pulmonary clinic. He has PEG in place. He has recent admissions to [facility] and [facility] for recurrent aspiration most recently 8/4 when he required intubation. Also developed septic shock requiring pressors. He is supposed to use thickener to hopefully prevent aspiration, however, he has had issues obtaining/using this in the past.   8/25 he became unresponsive at home and his wife called EMS. Upon EMS arrival Tony Berry remained unresponsive. He is on chronic opioids for pain and was given narcan by EMS with some improvement in symptoms, which was transient. Upon arrival to emergency department he was poorly responsive and remained hypoxemic requiring 15L non-rebreather to maintain adequate O2 sats. He was intubated with concern for further deterioration. Imaging demonstrated LLL opacification, which seemed to be consistent with several recent CXR. Started on low dose levophed for shock. Transferred to [facility].  He was weaned from the ventilator with improving mental status, but had worsening respiratory distress on 8/27 and required re-intubation.  Past Medical History   has a past medical history of Esophageal cancer ([place]), Head and neck cancer ([place]), and Hypertension.  Significant Hospital Events    8/25 admitted 8/27, extubated then re-intubated  Consults:  8/26 Palliative Care  Procedures:  ETT 8/25 >8/27, 8/27>  Significant Diagnostic Tests:  8/26 CXR>>increasing LLL opacity  Micro Data:  COVID [facility]) 8/25 >> NEG MRSA screen 8/25>>neg Respiratory Culture 8/26>>gm + cocci>>  Antimicrobials:  Ceftriaxone 8/26>  Interim history/subjective:  Failed extubation again yesterday due to some combination of upper airway obstruction No events, awake, now agreeable to trach.  Objective   Blood pressure 109/67, pulse 66, temperature (!) 96.5 F (35.8 C), temperature source Axillary, resp. rate 20, height 5\' 6"  (1.676 m), weight 45.2 kg, SpO2 98 %.    Vent Mode: PRVC FiO2 (%):  [40 %-60 %] 40 % Set Rate:  [20 bmp-24 bmp] 20 bmp Vt Set:  [510 mL] 510 mL PEEP:  [5 cmH20] 5 cmH20 Plateau Pressure:  [14 cmH20-18 cmH20] 18 cmH20   Intake/Output Summary (Last 24 hours) at 11/21/2018 0826 Last data filed at 11/21/2018 0600 Gross per 24 hour  Intake 1492.61 ml  Output 2050 ml  Net -557.39 ml   Filed Weights   11/19/18 0500 11/20/18 0300 11/21/18 0500  Weight: 47.4 kg 49.1 kg 45.2 kg    GEN: middle aged man in NAD HEENT: ETT in place with small-moderate secretions CV: RRR, ext warm PULM: Scattered rhonci on L, no accessory muscle use GI: PEG in place, soft EXT: No edema NEURO: moves all 4 ext briskly to command PSYCH: RASS 0 SKIN: No rashes    Resolved Hospital Problem list   Encephalopathy Shock  Assessment & Plan:   # Acute on chronic hypoxemic/hypercabric respiratory failure likely secondary to acute serratia pneumonia on top of chronic  aspiration syndrome- has been intubated multiple times.  All of this is mostly chronic due to his severe malnutrition, distorted upper airway anatomy from prior radiation, and deconditioning he cannot maintain his airway for forseeable futrue.  Explained at length with him and wife and they would like to proceed with tracheostomy  rather than comfort care. # Protein calorie malnutrition- moderate to severe # Questionable COPD diagnosis- no PFTs on file # Hx head and neck cancer causing dysphagia and PEG dependence  - Limit sedating meds - Ceftriaxone x 7 days - Hold TF - Trach this afternoon then aggressive mobility and vent wean - Nebs fine as ordered but not sure they are doing anything  Best practice:  Diet: Tube feeds Pain/Anxiety/Delirium protocol (if indicated): oxycodone PRN VAP protocol (if indicated): Per prot DVT prophylaxis: lovenox GI prophylaxis: PPI Glucose control: SSI Mobility: BR Code Status: Limited - no CPR or shocks Family Communication: called and updated wife Disposition: ICU  The patient is critically ill with multiple organ systems failure and requires high complexity decision making for assessment and support, frequent evaluation and titration of therapies, application of advanced monitoring technologies and extensive interpretation of multiple databases. Critical Care Time devoted to patient care services described in this note independent of APP/resident time (if applicable)  is 31 minutes.   Erskine Emery MD [place] [facility] 11/21/18 8:26 AM Personal pager: [phone] If unanswered, please page CCM On-call: [phone]

## 2018-11-21 NOTE — Procedures (Signed)
Bedside Tracheostomy Insertion Procedure Note   Patient Details:   Name: Tony Berry DOB: 08-26-1960 MRN: [id number]  Procedure: Tracheostomy  Pre Procedure Assessment: ET Tube Size:8.0 ET Tube secured at lip (cm):24 Bite block in place: No Breath Sounds: Clear and Diminished  Post Procedure Assessment: BP 136/79   Pulse 73   Temp (!) 96.5 F (35.8 C) (Axillary)   Resp 20   Ht 5\' 6"  (1.676 m)   Wt 45.2 kg   SpO2 99%   BMI 16.08 kg/m  O2 sats: stable throughout Complications: No apparent complications Patient did tolerate procedure well Tracheostomy Brand:Shiley Tracheostomy Style:Cuffed Tracheostomy Size: 6 Tracheostomy Secured [id number] & Sutures Tracheostomy Placement Confirmation:Trach cuff visualized and in place and Chest X ray ordered for placement    Ciro Backer 11/21/2018, 11:42 AM

## 2018-11-21 NOTE — Procedures (Addendum)
Procedure: Percutaneous Tracheostomy CPT 31600 Performed by: Dr. Erskine Emery Bronchoscopy Assistant: Dr. Nelda Marseille.  Indications: Chronic respiratory failure and need for ongoing mechanical ventilation.  Consent: Given patient's intubation and sedation, the patient was unable to provide consent. Discussed the procedure with the patient's decision maker, including the indications, risks, benefits, and alternatives. All questions were answered. Verbal witnessed consent was obtained and placed in the chart.  Preprocedure: Universal protocol was followed for this procedure. Prior to the initiation of sedation or the procedure, a timeout/"Pause for the Cause" was performed. The patient's identity was verified by confirming the patient's wrist band for name, date of birth, and medical record number. Everyone in the room was in agreement with the patient identify, the procedure to be performed, consent was in place and matched the planned procedure, and the procedure site. The area was cleaned with a CHG scrub and draped with large sterile barrier. Hand hygiene was performed, and cap, mask, sterile gown, and sterile gloves were worn. The patient was covered by a large sterile drape. Sterile technique was maintained for the entire procedure.  Anesthesia: The patient was intubated and sedated prior to the procedure. Additional midazolam, etomidate, fentanyl and rocuronium were given for sedation and paralysis with close attention to vital signs throughout procedure. Please refer to the accompanying procedural sedation form for additional details. Once the patient was adequately sedated and with continuous BIS monitoring, vecuronium was administered for paralysis.  Procedure: The patient was placed in the supine position. The anterior neck was prepped and draped in usual sterile fashion. 1% lidocaine was administered approximately 2 fingerbreadths above the sternal notch for local anesthesia. A 1.5-cm vertical incision  was then performed 2 fingerbreadths above the sternal notch. Using a curved Kelly, blunt dissection was performed down to the level of the pretracheal fascia. At this point, the bronchoscope was introduced through the endotracheal tube and the trachea was properly visualized. The endotracheal tube was then gradually withdrawn within the trachea under direct bronchoscopic visualization. Proper midline position was confirmed by bouncing the needle from the tracheostomy tray over the trachea with bronchoscopic examination. The needle was advanced into the trachea and proper positioning was confirmed with direct visualization. The needle was then removed leaving a white outer cannula in position. The wire from the tracheostomy tray was then advanced through the white outer cannula. The cannula was then removed. The small, blue dilator was then advanced over the wire into the trachea. Once proper dilatation was achieved, the dilator was removed. The large, tapered dilator was then advanced over the wire into the trachea. The dilator was removed leaving the wire and white inner cannula in position. A number 6-0 percutaneous Shiley tracheostomy tube was then advanced over the wire and white inner cannula into the trachea. Proper positioning was confirmed with bronchoscopic visualization. The tracheostomy tube was then sutured in place with four nylon sutures. It was further secured with a tracheostomy tie.  Estimated blood loss: Less than 5 mL.  Complications: None immediate.  CXR ordered.  Wife called after procedure and updated.

## 2018-11-21 NOTE — Procedures (Signed)
Bronchoscopy Procedure Note Tony Berry [id number] 1960-08-26  Procedure: Bronchoscopy Indications: Diagnostic evaluation of the airways  Procedure Details Consent: Risks of procedure as well as the alternatives and risks of each were explained to the (patient/caregiver).  Consent for procedure obtained. Time Out: Verified patient identification, verified procedure, site/side was marked, verified correct patient position, special equipment/implants available, medications/allergies/relevent history reviewed, required imaging and test results available.  Performed  In preparation for procedure, patient was given 100% FiO2 and bronchoscope lubricated. Sedation: Benzodiazepines, Muscle relaxants, Etomidate and Fentanyl   Airway entered and the following bronchi were examined: RUL, RML, RLL, LUL, LLL and Bronchi.   Bronchoscope removed.  , Patient placed back on 100% FiO2 at conclusion of procedure.    Evaluation Hemodynamic Status: BP stable throughout; O2 sats: stable throughout Patient's Current Condition: stable Specimens:  None Complications: No apparent complications Patient did tolerate procedure well.   Tony Berry 11/21/2018

## 2018-11-22 LAB — CULTURE, BLOOD (ROUTINE X 2)
Culture: NO GROWTH
Culture: NO GROWTH
Special Requests: ADEQUATE
Special Requests: ADEQUATE

## 2018-11-22 LAB — GLUCOSE, CAPILLARY
Glucose-Capillary: 149 mg/dL — ABNORMAL HIGH (ref 70–99)
Glucose-Capillary: 166 mg/dL — ABNORMAL HIGH (ref 70–99)
Glucose-Capillary: 196 mg/dL — ABNORMAL HIGH (ref 70–99)
Glucose-Capillary: 123 mg/dL — ABNORMAL HIGH (ref 70–99)
Glucose-Capillary: 87 mg/dL (ref 70–99)

## 2018-11-22 MED ORDER — IPRATROPIUM-ALBUTEROL 0.5-2.5 (3) MG/3ML IN SOLN: 3.0000 mL | RESPIRATORY_TRACT | Status: DC | PRN

## 2018-11-22 MED ORDER — MELATONIN 3 MG PO TABS
3.0000 mg | ORAL_TABLET | Freq: Every evening | ORAL | Status: DC | PRN
  Administered 2018-11-22: 22:00:00 3 mg
  Filled 2018-11-22 (×2): qty 1

## 2018-11-22 MED ORDER — OXYCODONE HCL 5 MG PO TABS
10.0000 mg | ORAL_TABLET | ORAL | Status: DC
  Administered 2018-11-22: 10 mg via ORAL
  Filled 2018-11-22: qty 2

## 2018-11-22 MED ORDER — NON FORMULARY: 3.0000 mg | Freq: Every evening | Status: DC | PRN

## 2018-11-22 MED ORDER — GUAIFENESIN 100 MG/5ML PO SOLN
5.0000 mL | ORAL | Status: DC | PRN
  Administered 2018-11-22 – 2018-11-23 (×5): 100 mg via ORAL
  Filled 2018-11-22 (×5): qty 10

## 2018-11-22 MED ORDER — MAGIC MOUTHWASH W/LIDOCAINE
5.0000 mL | Freq: Four times a day (QID) | ORAL | Status: DC
  Administered 2018-11-22 (×3): 5 mL via ORAL
  Filled 2018-11-22 (×7): qty 5

## 2018-11-22 MED ORDER — RAMELTEON 8 MG PO TABS
8.0000 mg | ORAL_TABLET | Freq: Every day | ORAL | Status: DC
  Filled 2018-11-22: qty 1

## 2018-11-22 MED ORDER — OXYCODONE HCL 5 MG PO TABS
10.0000 mg | ORAL_TABLET | ORAL | Status: DC | PRN
  Filled 2018-11-22: qty 2

## 2018-11-22 MED ORDER — OXYCODONE HCL 5 MG PO TABS
10.0000 mg | ORAL_TABLET | ORAL | Status: DC
  Administered 2018-11-22 – 2018-11-23 (×5): 10 mg
  Filled 2018-11-22 (×4): qty 2

## 2018-11-22 MED ORDER — ASPIRIN 81 MG PO CHEW
81.0000 mg | CHEWABLE_TABLET | Freq: Every day | ORAL | Status: DC
  Administered 2018-11-22 – 2018-12-13 (×22): 81 mg
  Filled 2018-11-22 (×22): qty 1

## 2018-11-22 MED ORDER — GABAPENTIN 250 MG/5ML PO SOLN
200.0000 mg | Freq: Three times a day (TID) | ORAL | Status: DC
  Administered 2018-11-22 – 2018-11-23 (×3): 200 mg
  Filled 2018-11-22 (×4): qty 4

## 2018-11-22 NOTE — Progress Notes (Signed)
NAME:  Tony Berry, MRN:  [id number], DOB:  [birth year], LOS: 6 ADMISSION DATE:  11/16/2018, CONSULTATION DATE:  8/25 REFERRING MD:  Tony Berry ED, CHIEF COMPLAINT:  Dypsea   Brief History   58 year old male with head and neck cancer s/p radiation therapy complicated by dysphagia and recurrent aspiration. Transferred to [facility] Berry 8/25 from [facility] ED where he required intubation in the setting of AMS and hypoxemic respiratory failure. Extubated 8/27 and shortly thereafter required re-intubation.   History of present illness   58 year old male with PMH as below, which is significant for head and neck cancer s/p radiation therapy, COPD, and recurrent aspiration. Followed by Dr. Valeta Berry in pulmonary clinic. He has PEG in place. He has recent admissions to [facility] for recurrent aspiration most recently 8/4 when he required intubation. Also developed septic shock requiring pressors. He is supposed to use thickener to hopefully prevent aspiration, however, he has had issues obtaining/using this in the past.   8/25 he became unresponsive at home and his wife called EMS. Upon EMS arrival Tony Berry remained unresponsive. He is on chronic opioids for pain and was given narcan by EMS with some improvement in symptoms, which was transient. Upon arrival to emergency department he was poorly responsive and remained hypoxemic requiring 15L non-rebreather to maintain adequate O2 sats. He was intubated with concern for further deterioration. Imaging demonstrated LLL opacification, which seemed to be consistent with several recent CXR. Started on low dose levophed for shock. Transferred to Tony Berry.  He was weaned from the ventilator with improving mental status, but had worsening respiratory distress on 8/27 and required re-intubation.  Past Medical History   has a past medical history of Esophageal cancer ([place]), Head and neck cancer ([place]), and Hypertension.  Significant Berry Events    8/25 admitted 8/27, extubated then re-intubated  Consults:  8/26 Palliative Care  Procedures:  ETT multiple times Trach 8/30  Significant Diagnostic Tests:  8/26 CXR>>increasing LLL opacity  Micro Data:  COVID Tony Berry) 8/25 >> NEG MRSA screen 8/25>>neg Respiratory Culture 8/26>>gm + cocci>>  Antimicrobials:  Ceftriaxone 8/26>  Interim history/subjective:  Doing well, some throat pain but manageable.  Starting PS trial.  Objective   Blood pressure 128/77, pulse 67, temperature 97.8 F (36.6 C), temperature source Oral, resp. rate 19, height 5\' 6"  (1.676 m), weight 48.8 kg, SpO2 99 %.    Vent Mode: PRVC FiO2 (%):  [40 %-100 %] 40 % Set Rate:  [20 bmp] 20 bmp Vt Set:  [510 mL] 510 mL PEEP:  [5 cmH20] 5 cmH20 Plateau Pressure:  [14 cmH20-20 cmH20] 18 cmH20   Intake/Output Summary (Last 24 hours) at 11/22/2018 0827 Last data filed at 11/22/2018 0600 Gross per 24 hour  Intake 1939.61 ml  Output 750 ml  Net 1189.61 ml   Filed Weights   11/20/18 0300 11/21/18 0500 11/22/18 0345  Weight: 49.1 kg 45.2 kg 48.8 kg    GEN: middle aged man in NAD HEENT: Trach in place, no major secretions CV: RRR, ext warm PULM: more clear today, no accessory muscle use GI: PEG in place, soft EXT: No edema, +muscle wasting NEURO: moves all 4 ext briskly to command PSYCH: RASS 0 SKIN: No rashes    Resolved Berry Problem list   Encephalopathy Shock  Assessment & Plan:   # Acute on chronic hypoxemic/hypercabric respiratory failure likely secondary to acute serratia pneumonia on top of chronic aspiration syndrome- previous PEG, now s/p trach looking  much better # Protein calorie malnutrition- moderate to severe # Questionable COPD diagnosis- no PFTs on file # Chronic pain- previously on methadone but has wanted off it for a while # Hx head and neck cancer causing dysphagia and PEG dependence  - DC precedex, increase gabapentin, start standing oxycodone as patient has been  requesting nearly continuously.  Start magic mouthwash for his chronic throat/chest pain likely related to esophageal scarring/damage from radiation therapy - Ceftriaxone x 7 days - Up to chair, PS trial, consider TC trials as early as tomorrow AM - Resume home ASA - Nebs fine as ordered but not sure they are doing anything  Best practice:  Diet: Tube feeds Pain/Anxiety/Delirium protocol (if indicated): oxycodone PRN VAP protocol (if indicated): Per prot DVT prophylaxis: lovenox GI prophylaxis: PPI Glucose control: SSI Mobility: BR Code Status: DNR Family Communication: called and updated wife Disposition: ICU  The patient is critically ill with multiple organ systems failure and requires high complexity decision making for assessment and support, frequent evaluation and titration of therapies, application of advanced monitoring technologies and extensive interpretation of multiple databases. Critical Care Time devoted to patient care services described in this note independent of APP/resident time (if applicable)  is 32 minutes.   Tony Emery MD [facility] 11/22/18 8:27 AM Personal pager: [phone] If unanswered, please page CCM On-call: [phone]

## 2018-11-22 NOTE — Progress Notes (Signed)
Physical Therapy Treatment Patient Details Name: Tony Berry MRN: [id number] DOB: 01/26/1961 Today's Date: 11/22/2018    History of Present Illness 58 year old male with PMH head and neck cancer s/p radiation therapy, COPD, and recurrent aspiration. He has PEG in place. He has recent admissions to [facility] and [facility] for recurrent aspiration most recently 8/4 when he required intubation.8/25 he became unresponsive at home and his wife called EMS. Found to haveAcute on chronic hypoxemic/hypercabric respiratory failure likely secondary to chronic aspiration, high dose Methadone and underlying emphysema andAcute encephalopathy secondary to acute on chronic respiratory failure. Intubated 8/25, extubated 8/27 and reintubated same day    PT Comments    Patient progressing to OOB to chair this session and much improved respiratory status on trach collar.  Feel he remains appropriate for STSNF prior to d/c home.  PT to follow acutely.    Follow Up Recommendations  SNF     Equipment Recommendations  None recommended by PT    Recommendations for Other Services       Precautions / Restrictions Precautions Precautions: Fall Precaution Comments: PEG tube baseline; new tracheostomy    Mobility  Bed Mobility Overal bed mobility: Needs Assistance Bed Mobility: Supine to Sit     Supine to sit: Min assist        Transfers Overall transfer level: Needs assistance Equipment used: 1 person hand held assist Transfers: Sit to/from [facility] Sit to Stand: Min assist Stand pivot transfers: Min assist       General transfer comment: stood with A for balance and lines, then step to recliner assist wtih HHA and min A for balance  Ambulation/Gait                 Stairs             Wheelchair Mobility    Modified Rankin (Stroke Patients Only)       Balance Overall balance assessment: Needs assistance   Sitting balance-Leahy Scale:  Fair     Standing balance support: No upper extremity supported Standing balance-Leahy Scale: Poor Standing balance comment: min a for balance                            Cognition Arousal/Alertness: Awake/alert Behavior During Therapy: WFL for tasks assessed/performed Overall Cognitive Status: Difficult to assess                                        Exercises General Exercises - Lower Extremity Gluteal Sets: Strengthening;10 reps;Seated Hip Flexion/Marching: Strengthening;10 reps;Seated    General Comments General comments (skin integrity, edema, etc.): new tracheostomy, pt demonstrating no respiratory distress, but c/o throat pain      Pertinent Vitals/Pain Pain Assessment: Faces Faces Pain Scale: Hurts even more Pain Location: throat Pain Descriptors / Indicators: Grimacing;Guarding Pain Intervention(s): Monitored during session;Patient requesting pain meds-RN notified    Home Living                      Prior Function            PT Goals (current goals can now be found in the care plan section) Progress towards PT goals: Progressing toward goals    Frequency    Min 2X/week      PT Plan Current plan remains appropriate  Co-evaluation              AM-PAC PT "6 Clicks" Mobility   Outcome Measure  Help needed turning from your back to your side while in a flat bed without using bedrails?: A Little Help needed moving from lying on your back to sitting on the side of a flat bed without using bedrails?: A Little Help needed moving to and from a bed to a chair (including a wheelchair)?: A Little Help needed standing up from a chair using your arms (e.g., wheelchair or bedside chair)?: A Little Help needed to walk in hospital room?: Total Help needed climbing 3-5 steps with a railing? : Total 6 Click Score: 14    End of Session Equipment Utilized During Treatment: (trachcollar) Activity Tolerance: Patient  tolerated treatment well Patient left: in chair;with chair alarm set;with call bell/phone within reach   PT Visit Diagnosis: Other abnormalities of gait and mobility (R26.89);Muscle weakness (generalized) (M62.81);Adult, failure to thrive (R62.7)     Time: [id number] PT Time Calculation (min) (ACUTE ONLY): 21 min  Charges:  $Therapeutic Activity: 8-22 mins                     Magda Kiel, [place] [phone] 11/22/2018    Reginia Naas 11/22/2018, 5:39 PM

## 2018-11-22 NOTE — Progress Notes (Signed)
eLink Physician-Brief Progress Note Patient Name: Tony Berry DOB: March 04, 1961 MRN: [id number]   Date of Service  11/22/2018  HPI/Events of Note  Notified by RN that patient was prescribed Ramelteon but could not be crushed to give through the PEG tube. RN also requesting cough medication.  eICU Interventions  Trial of melatonin. Guaifenesin ordered to be given prn.     Intervention Category Minor Interventions: Other:  Elsie Lincoln 11/22/2018, 8:43 PM

## 2018-11-23 LAB — BASIC METABOLIC PANEL
Anion gap: 8 (ref 5–15)
BUN: 24 mg/dL — ABNORMAL HIGH (ref 6–20)
CO2: 30 mmol/L (ref 22–32)
Calcium: 8.4 mg/dL — ABNORMAL LOW (ref 8.9–10.3)
Chloride: 97 mmol/L — ABNORMAL LOW (ref 98–111)
Creatinine, Ser: 0.32 mg/dL — ABNORMAL LOW (ref 0.61–1.24)
GFR calc Af Amer: >60 mL/min (ref >60)
GFR calc non Af Amer: >60 mL/min (ref >60)
Glucose, Bld: 102 mg/dL — ABNORMAL HIGH (ref 70–99)
Potassium: 3.9 mmol/L (ref 3.5–5.1)
Sodium: 135 mmol/L (ref 135–145)

## 2018-11-23 LAB — GLUCOSE, CAPILLARY
Glucose-Capillary: 100 mg/dL — ABNORMAL HIGH (ref 70–99)
Glucose-Capillary: 104 mg/dL — ABNORMAL HIGH (ref 70–99)
Glucose-Capillary: 110 mg/dL — ABNORMAL HIGH (ref 70–99)
Glucose-Capillary: 143 mg/dL — ABNORMAL HIGH (ref 70–99)
Glucose-Capillary: 86 mg/dL (ref 70–99)
Glucose-Capillary: 95 mg/dL (ref 70–99)

## 2018-11-23 MED ORDER — CLONAZEPAM 0.5 MG PO TABS
1.0000 mg | ORAL_TABLET | Freq: Every day | ORAL | Status: DC | PRN
  Administered 2018-11-23 – 2018-12-01 (×9): 1 mg
  Filled 2018-11-23 (×9): qty 2

## 2018-11-23 MED ORDER — TEMAZEPAM 15 MG PO CAPS: 15.0000 mg | ORAL_CAPSULE | Freq: Every evening | ORAL | Status: DC | PRN

## 2018-11-23 MED ORDER — GABAPENTIN 250 MG/5ML PO SOLN
300.0000 mg | Freq: Three times a day (TID) | ORAL | Status: DC
  Administered 2018-11-23 – 2018-12-13 (×59): 300 mg
  Filled 2018-11-23 (×71): qty 6

## 2018-11-23 MED ORDER — QUETIAPINE FUMARATE 25 MG PO TABS
50.0000 mg | ORAL_TABLET | Freq: Every day | ORAL | Status: DC
  Administered 2018-11-23 – 2018-12-09 (×17): 50 mg via ORAL
  Filled 2018-11-23: qty 2
  Filled 2018-11-23: qty 1
  Filled 2018-11-23: qty 2
  Filled 2018-11-23: qty 1
  Filled 2018-11-23: qty 2
  Filled 2018-11-23 (×2): qty 1
  Filled 2018-11-23 (×7): qty 2
  Filled 2018-11-23 (×2): qty 1
  Filled 2018-11-23: qty 2

## 2018-11-23 MED ORDER — OXYCODONE HCL 5 MG PO TABS
10.0000 mg | ORAL_TABLET | ORAL | Status: DC | PRN
  Administered 2018-11-23 – 2018-12-01 (×26): 10 mg
  Filled 2018-11-23 (×28): qty 2

## 2018-11-23 MED ORDER — OXYCODONE HCL 5 MG PO TABS
15.0000 mg | ORAL_TABLET | ORAL | Status: DC
  Administered 2018-11-23 – 2018-12-03 (×62): 15 mg
  Filled 2018-11-23 (×63): qty 3

## 2018-11-23 MED ORDER — VITAL AF 1.2 CAL PO LIQD
1000.0000 mL | ORAL | Status: AC
  Administered 2018-11-23 – 2018-11-26 (×6): 1000 mL

## 2018-11-23 MED ORDER — CHLORHEXIDINE GLUCONATE 0.12 % MT SOLN
15.0000 mL | Freq: Two times a day (BID) | OROMUCOSAL | Status: DC
  Administered 2018-11-24 – 2018-12-13 (×32): 15 mL via OROMUCOSAL
  Filled 2018-11-23 (×34): qty 15

## 2018-11-23 MED ORDER — GUAIFENESIN 100 MG/5ML PO SOLN
5.0000 mL | ORAL | Status: DC | PRN
  Administered 2018-11-24 – 2018-12-08 (×6): 100 mg
  Filled 2018-11-23 (×3): qty 10
  Filled 2018-11-23: qty 5
  Filled 2018-11-23: qty 10

## 2018-11-23 MED ORDER — ORAL CARE MOUTH RINSE
15.0000 mL | Freq: Two times a day (BID) | OROMUCOSAL | Status: DC
  Administered 2018-11-24 – 2018-12-13 (×30): 15 mL via OROMUCOSAL

## 2018-11-23 MED ORDER — CLONAZEPAM 0.1 MG/ML ORAL SUSPENSION: 0.5000 mg | Freq: Every day | ORAL | Status: DC

## 2018-11-23 NOTE — Progress Notes (Signed)
Called and updated wife and brother regarding plan of care.

## 2018-11-23 NOTE — Progress Notes (Signed)
 Nutrition Follow-up  DOCUMENTATION CODES:   Severe malnutrition in context of chronic illness, Underweight  INTERVENTION:   Tube Feeding:  Increase Vital AF 1.2 to 65 ml/hr Provides 1872 kcals, 117 g of protein and 1264 mL of free water Meets 100% estimated calorie and protein needs   NUTRITION DIAGNOSIS:   Severe Malnutrition related to chronic illness(head and neck cancer with dysphagia and aspiration, COPD) as evidenced by severe fat depletion, severe muscle depletion.  Being addressed via TF   GOAL:   Patient will meet greater than or equal to 90% of their needs  Progressing  MONITOR:   TF tolerance, Vent status, Labs, Weight trends, Skin  REASON FOR ASSESSMENT:   Consult, Ventilator Enteral/tube feeding initiation and management  ASSESSMENT:   58 yo male admitted with acute on chronic respiratory failure secondary to aspiration requiring intubation, septic shock. PMH includes head and neck cancer with dysphagia and aspriation s/p PEG tube, COPD, malnutrition  8/25 Admitted, Intubated 8/27 Extubated, Re-intubated 8/30 Trach placed 8/31 Trach collar  Pt has been tolerating TC since noon yesterday Starting PMV trials with SLP  Vital 1.2 at 55 ml/hr via G-tube   Labs: CBGs 87-123 Meds: reviewed    Diet Order:   Diet Order            Diet NPO time specified  Diet effective midnight              EDUCATION NEEDS:   Not appropriate for education at this time  Skin:  Skin Integrity Issues:: Stage II, Stage I Stage I: b/l heels; iliac crest Stage II: sacrum  Last BM:  9/1  Height:   Ht Readings from Last 1 Encounters:  11/16/18 5\' 6"  (1.676 m)    Weight:   Wt Readings from Last 1 Encounters:  11/23/18 49.3 kg    Ideal Body Weight:  67.3 kg  BMI:  Body mass index is 17.54 kg/m.  Estimated Nutritional Needs:   Kcal:  1715-1960 kcals  Protein:  88-108 g  Fluid:  >/= 1.7 L   Donja Tipping MS, RDN, LDN, CNSC [phone] Pager   [phone] Weekend/On-Call Pager

## 2018-11-23 NOTE — Progress Notes (Signed)
NAME:  Tony Berry, MRN:  [id number], DOB:  1960/06/02, LOS: 7 ADMISSION DATE:  11/16/2018, CONSULTATION DATE:  8/25 REFERRING MD:  Tony Berry ED, CHIEF COMPLAINT:  Dypsea   Brief History   57 year old male with head and neck cancer s/p radiation therapy complicated by dysphagia and recurrent aspiration. Transferred to Tony Berry 8/25 from [facility] ED where he required intubation in the setting of AMS and hypoxemic respiratory failure. Extubated 8/27 and shortly thereafter required re-intubation.   History of present illness   58 year old male with PMH as below, which is significant for head and neck cancer s/p radiation therapy, COPD, and recurrent aspiration. Followed by Tony Berry in pulmonary clinic. He has PEG in place. He has recent admissions to [facility] and [facility] for recurrent aspiration most recently 8/4 when he required intubation. Also developed septic shock requiring pressors. He is supposed to use thickener to hopefully prevent aspiration, however, he has had issues obtaining/using this in the past.   8/25 he became unresponsive at home and his wife called EMS. Upon EMS arrival Tony Berry remained unresponsive. He is on chronic opioids for pain and was given narcan by EMS with some improvement in symptoms, which was transient. Upon arrival to emergency department he was poorly responsive and remained hypoxemic requiring 15L non-rebreather to maintain adequate O2 sats. He was intubated with concern for further deterioration. Imaging demonstrated LLL opacification, which seemed to be consistent with several recent CXR. Started on low dose levophed for shock. Transferred to Tony Berry.  He was weaned from the ventilator with improving mental status, but had worsening respiratory distress on 8/27 and required re-intubation.  Past Medical History   has a past medical history of Esophageal cancer ([place]), Head and neck cancer ([place]), and Hypertension.  Significant Hospital Events    8/25 admitted 8/27, extubated then re-intubated  Consults:  8/26 Palliative Care  Procedures:  ETT multiple times Trach 8/30  Significant Diagnostic Tests:    Micro Data:  COVID Tony Berry) 8/25 >> NEG MRSA screen 8/25>>neg Respiratory Culture 8/26>>serratia  Antimicrobials:  Ceftriaxone 8/26 x 7 days  Interim history/subjective:  Doing great.  On TC since around noon yesterday.  Still anxious and in pain, did not sleep well.  Objective   Blood pressure 115/67, pulse 77, temperature 98.7 F (37.1 C), temperature source Oral, resp. rate 12, height 5\' 6"  (1.676 m), weight 49.3 kg, SpO2 95 %.    Vent Mode: PSV;CPAP FiO2 (%):  [35 %-40 %] 35 % PEEP:  [50 cmH20] 50 cmH20 Pressure Support:  [10 cmH20] 10 cmH20   Intake/Output Summary (Last 24 hours) at 11/23/2018 0742 Last data filed at 11/23/2018 [id number] Gross per 24 hour  Intake 1464.2 ml  Output 1035 ml  Net 429.2 ml   Filed Weights   11/21/18 0500 11/22/18 0345 11/23/18 0500  Weight: 45.2 kg 48.8 kg 49.3 kg    GEN: middle aged man in NAD HEENT: Trach in place, no major secretions CV: RRR, ext warm PULM: clear, no accessory muscle use GI: PEG in place, soft EXT: No edema, +muscle wasting NEURO: moves all 4 ext briskly to command PSYCH: RASS 0, anxious SKIN: No rashes    Resolved Hospital Problem list   Encephalopathy Shock  Assessment & Plan:   # Acute on chronic hypoxemic/hypercabric respiratory failure likely secondary to acute serratia pneumonia on top of chronic aspiration syndrome- previous PEG, now s/p trach looking much better # Protein calorie malnutrition- moderate to severe #  Questionable COPD diagnosis- no PFTs on file, no noticeable benefit to breathing with nebs # Chronic pain- previously on methadone but has wanted off it for a while; on standing oxycodone/tylenol/gabapentin # Hx head and neck cancer causing dysphagia and PEG dependence # Insomnia- melatonin no benefit, cannot crush ramelteon   - Increase standing oxycodone, gabapentin - Stop magic mouthwash, no benefit - Trial of daily klonipin PRN - Nebs PRN - OOB to chair - Continue TF, f/u AM BMP - Recommended for SNF by PT - Has to stay in ICU 48 hours post trach for some reason so will keep another day; will ask TRH to take over as primary and will stay on for intermittent trach f/u - Needs trach sutures out around 9/7  Best practice:  Diet: Tube feeds Pain/Anxiety/Delirium protocol (if indicated): see above VAP protocol (if indicated): Per prot DVT prophylaxis: lovenox GI prophylaxis: PPI Glucose control: SSI Mobility: BR Code Status: DNR Family Communication: will call Disposition: ICU  Tony Emery MD [facility] 11/23/18 7:42 AM Personal pager: [phone] If unanswered, please page CCM On-call: [phone]

## 2018-11-23 NOTE — TOC Initial Note (Signed)
Transition of Care [facility]) - Initial/Assessment Note    Patient Details  Name: Tony Berry MRN: [id number] Date of Birth: March 06, 1961  [facility] CM/SW Contact:    Bartholomew Crews, RN Phone Number: [phone] 11/23/2018, 3:48 PM  Clinical Narrative:                 Spoke with spouse on the phone to discuss transition of care needs. Spouse realizes that patient needs rehab care. Discussed Medicaid and lack of rehab benefit if going to nursing home. Patient does not qualify for disability or Medicare, however, CM was able to find out that patient's Medicaid does have disability and would pay for patient to stay in a facility for a minimum of 30 days. Patient would be unlikely to get any rehab except possibly an evaluation d/t lack of rehab benefit with Medicaid. Discussed home health benefits to provide up to 3 visits a year, but Medicaid will typically cover inpatient rehab is patient meets criteria or outpatient physical therapy. Spouse does not want patient to go to a nursing home if rehab cannot be offered. CM to follow for transition of care needs.   Expected Discharge Plan: Long Term Nursing Home Barriers to Discharge: Continued Medical Work up   Patient Goals and CMS Choice   CMS Medicare.gov Compare Post Acute Care list provided to:: Patient Choice offered to / list presented to : Patient  Expected Discharge Plan and Services Expected Discharge Plan: [name]   Discharge Planning Services: CM Consult   Living arrangements for the past 2 months: Apartment                                      Prior Living Arrangements/Services Living arrangements for the past 2 months: Apartment Lives with:: Self, Spouse                   Activities of Daily Living      Permission Sought/Granted                  Emotional Assessment              Admission diagnosis:  RESPIRATORY FAILURE Patient Active Problem List   Diagnosis Date  Noted  . Endotracheally intubated   . Head and neck cancer ([place])   . S/P percutaneous endoscopic gastrostomy (PEG) tube placement ([place])   . Cachexia ([place])   . Failure to thrive in adult   . Hypotension due to drugs   . Acute on chronic respiratory failure with hypoxia ([place]) 11/16/2018  . Encounter for imaging study to confirm orogastric (OG) tube placement   . History of ETT   . Palliative care by specialist   . Acute on chronic respiratory failure with hypoxia and hypercapnia ([place]) 10/26/2018  . Pressure injury of skin 10/26/2018  . Aspiration into airway 10/26/2018  . History of head and neck cancer 10/26/2018  . Fall at home, initial encounter 09/28/2018  . Goals of care, counseling/discussion   . DNR (do not resuscitate) discussion   . Chest discomfort 05/06/2018  . Dysphagia 04/08/2018  . Aspiration pneumonia ([place]) 04/04/2018  . Acute on chronic respiratory failure ([place]) 03/15/2018  . Carditis   . Troponin level elevated 03/11/2018  . Protein-calorie malnutrition, severe 03/11/2018  . Hyponatremia 03/11/2018  . Hypophosphatemia 03/11/2018  . Multifocal pneumonia 03/10/2018   PCP:  Cher Nakai,  MD Pharmacy:   [place], [place] - [address] [place] [address] Phone: [phone] Fax: [place], [place] [address] [place] [address] Phone: [phone] Fax: [phone]     Social Determinants of Health (SDOH) Interventions    Readmission Risk Interventions Readmission Risk Prevention Plan 10/01/2018  Transportation Screening Complete  PCP or Specialist Appt within 3-5 Days Complete  HRI or [place] Complete  Social Work Consult for [place] Planning/Counseling Complete  Palliative Care Screening Complete  Medication Review Press photographer) Complete  Some recent data might be hidden

## 2018-11-23 NOTE — Evaluation (Signed)
Passy-Muir Speaking Valve - Evaluation Patient Details  Name: Tony Berry MRN: [id number] Date of Birth: [birth year]  Today's Date: 11/23/2018 Time: 1020-1100 SLP Time Calculation (min) (ACUTE ONLY): 40 min  Past Medical History:  Past Medical History:  Diagnosis Date  . Esophageal cancer ([place])   . Head and neck cancer ([place])   . Hypertension    Past Surgical History:  Past Surgical History:  Procedure Laterality Date  . BACK SURGERY  2008, 2011  . FOOT SURGERY Right   . IR GASTROSTOMY TUBE MOD SED  04/08/2018  . LEG SURGERY Right    HPI:  Patient is a 58 y.o. male with PMH: head and neck cancer s/p radiation, chronic respiratory failure on 3L O2 at baseline, HTN, hyponatremia, severe protient calorie malnutrition, PVD, dysphagia, recent admission last month for aspiration PNA but without intubation, has a PEG but continues to eat and drink and reportedly not following SLP's recommendations. Most recent MBS was completed 03/12/18 as inpatient with results: chronic pharyngeal dysphagia  with consistent penetration of all bolus consistencies into larynx; portion of penetrates passed through vocal folds and into trachea resulting in sensed aspiration. As nectar thick liquids were tolerated slightly better than thin liquids, recommendation was for Dysphagia 2 (fine chop), nectar thick liquids, with additional recommendation for ENT repeat laryngoscopy.   Assessment / Plan / Recommendation Clinical Impression  Pt was seen at bedside to assess readiness for Passy-Muir Speaking Valve, s/p #6 Shiley cuffed trach placement 11/21/2018. Respiratory therapy was present for cuff deflation and suctioning, which pt tolerated well. Pt was educated regarding purpose and function of PMSV. Pt tolerated finger occlusion and was able to bring up bloody secretions which were suctioned from his mouth. O2 sats remained in the 90's, but dropped from 94 to 90 following valve placement. Pt tolerated valve for less  than 30 seconds x2 before requesting it be removed. No vocalization was elicited. Suspect exhaled air building in lower airway as indicated with burst of air when valve removed, possibly related to swelling associated with newly placed trach. Pt was encouraged to continue trials for increased tolerance. PMSV left in pt room for use with speech therapy only. Will continue to follow pt acutely.  SLP Visit Diagnosis: Aphonia (R49.1)    SLP Assessment  Patient needs continued Speech Language Pathology Services    Follow Up Recommendations  Other (comment)(TBD)    Frequency and Duration min 2x/week  2 weeks    PMSV Trial PMSV was placed for: Less than one minute at a time Able to redirect subglottic air through upper airway: Yes Able to Attain Phonation: No attempt to phonate Able to Expectorate Secretions: Yes Level of Secretion Expectoration with PMSV: Oral Breath Support for Phonation: Mildly decreased Intelligibility: Unable to assess (comment) Respirations During Trial: 20 SpO2 During Trial: 90 % Pulse During Trial: 88 Behavior: Alert;Controlled;Cooperative;Expresses self well;Good eye contact;Responsive to questions   Tracheostomy Tube  #6 Shiley cuffed.   Vent Dependency  FiO2 (%): 28 %(Titrated to 28%, sats 94%.)    Cuff Deflation Trial    Celia B. Quentin Ore [facility], CCC-SLP Speech Language Pathologist [phone]  Tolerated Cuff Deflation: Yes Behavior: Alert;Controlled;Cooperative;Expresses self well;Good eye contact        Shonna Chock 11/23/2018, 11:22 AM

## 2018-11-24 DIAGNOSIS — Z930 Tracheostomy status: Secondary | ICD-10-CM

## 2018-11-24 LAB — GLUCOSE, CAPILLARY
Glucose-Capillary: 104 mg/dL — ABNORMAL HIGH (ref 70–99)
Glucose-Capillary: 106 mg/dL — ABNORMAL HIGH (ref 70–99)
Glucose-Capillary: 109 mg/dL — ABNORMAL HIGH (ref 70–99)
Glucose-Capillary: 112 mg/dL — ABNORMAL HIGH (ref 70–99)
Glucose-Capillary: 115 mg/dL — ABNORMAL HIGH (ref 70–99)
Glucose-Capillary: 117 mg/dL — ABNORMAL HIGH (ref 70–99)
Glucose-Capillary: 125 mg/dL — ABNORMAL HIGH (ref 70–99)

## 2018-11-24 NOTE — Progress Notes (Signed)
  Speech Language Pathology Treatment: Tony Berry Speaking valve  Patient Details Name: Tony Berry MRN: [id number] DOB: October 03, 1960 Today's Date: 11/24/2018 Time: 1000-1030 SLP Time Calculation (min) (ACUTE ONLY): 30 min  Assessment / Plan / Recommendation Clinical Impression  PT seen for skilled ST intervention targeting goals for increased tolerance of Passy-Muir speaking valve. Pt seated in recliner, cuff deflated upon arrival of SLP. Respiratory therapy in to suction pt prior to valve trial. SLP reviewed process of PMSV trial with pt to educate and reduce apprehension. Pt accepted brief trials of PMSV placement (less than 1 minute each) with stable vital signs. Pt was able to expectorate secretions orally with valve in place, however, no vocalization was elicited. Pt requested removal of valve after only a few seconds. Air stacking noted upon removal of valve. SLP returned valve to container for use with speech therapy next time. RN informed of pt performance today.    HPI HPI: Patient is a 58 y.o. male with PMH: head and neck cancer s/p radiation, chronic respiratory failure on 3L O2 at baseline, HTN, hyponatremia, severe protient calorie malnutrition, PVD, dysphagia, recent admission last month for aspiration PNA but without intubation, has a PEG but continues to eat and drink and reportedly not following SLP's recommendations. Most recent MBS was completed 03/12/18 as inpatient with results: chronic pharyngeal dysphagia  with consistent penetration of all bolus consistencies into larynx; portion of penetrates passed through vocal folds and into trachea resulting in sensed aspiration. As nectar thick liquids were tolerated slightly better than thin liquids, recommendation was for Dysphagia 2 (fine chop), nectar thick liquids, with additional recommendation for ENT repeat laryngoscopy.      SLP Plan  Continue with current plan of care       Recommendations   Continue ST intervention for  increased tolerance of speaking valve.      Patient may use Passy-Muir Speech Valve: with SLP only PMSV Supervision: Full MD: Please consider changing trach tube to : Cuffless         Oral Care Recommendations: Oral care QID;Staff/trained caregiver to provide oral care Follow up Recommendations: (TBD) SLP Visit Diagnosis: Aphonia (R49.1) Plan: Continue with current plan of care       [place] B. Quentin Ore [facility], [place] Speech Language Pathologist [phone]  Shonna Chock 11/24/2018, 10:32 AM

## 2018-11-24 NOTE — Progress Notes (Signed)
  Speech Language Pathology Treatment: [name] Speaking valve  Patient Details Name: Tony Berry MRN: [id number] DOB: [birth year] Today's Date: 11/24/2018 Time: 1510-1530 SLP Time Calculation (min) (ACUTE ONLY): 20 min  Assessment / Plan / Recommendation Clinical Impression  RN paged SLP per pt request to try PMSV again this afternoon. Upon arrival of SLP to unit, RN reported having just suctioned. Pt was alert, seated upright in recliner, and was willing to try PMSV again this afternoon. Cuff remains deflated. Pt tolerates valve for a maximum of 15 seconds at a time, and indicates he gets "No air". Pt is unable to redirect subglottic air through upper airway with valve in place, or with finger occlusion, and did not expectorate secretions orally this session. Pt is able to communicate in writing with whiteboard, as well as mouthing words. He appears understandably frustrated with his lack of success with speaking valve. Will continue per plan of care.    HPI HPI: Patient is a 58 y.o. male with PMH: head and neck cancer s/p radiation, chronic respiratory failure on 3L O2 at baseline, HTN, hyponatremia, severe protient calorie malnutrition, PVD, dysphagia, recent admission last month for aspiration PNA but without intubation, has a PEG but continues to eat and drink and reportedly not following SLP's recommendations. Most recent MBS was completed 03/12/18 as inpatient with results: chronic pharyngeal dysphagia  with consistent penetration of all bolus consistencies into larynx; portion of penetrates passed through vocal folds and into trachea resulting in sensed aspiration. As nectar thick liquids were tolerated slightly better than thin liquids, recommendation was for Dysphagia 2 (fine chop), nectar thick liquids, with additional recommendation for ENT repeat laryngoscopy.      SLP Plan  Continue with current plan of care       Recommendations         Patient may use Passy-Muir Speech  Valve: with SLP only PMSV Supervision: Full MD: Please consider changing trach tube to : Cuffless         Oral Care Recommendations: Oral care QID;Staff/trained caregiver to provide oral care Follow up Recommendations: (TBD) SLP Visit Diagnosis: Aphonia (R49.1) Plan: Continue with current plan of care       [place] B. Quentin O[facility], CCC-SLP Speech Language Pathologist [phone]  Shonna Chock 11/24/2018, 3:34 PM

## 2018-11-24 NOTE — Progress Notes (Signed)
Evaluated by PCCM today. [facility] will pick up patient tomorrow.

## 2018-11-24 NOTE — Progress Notes (Addendum)
NAME:  Tony Berry, MRN:  [id number], DOB:  09-Dec-1960, LOS: 8 ADMISSION DATE:  11/16/2018, CONSULTATION DATE:  8/25 REFERRING MD:  [facility], CHIEF COMPLAINT:  Dypsea   Brief History   58 year old male with head and neck cancer s/p radiation therapy complicated by dysphagia and recurrent aspiration. [name]25 from [facility] ED where he required intubation in the setting of AMS and hypoxemic respiratory failure. Extubated 8/27 and shortly thereafter required re-intubation.   History of present illness   58 year old male with PMH as below, which is significant for head and neck cancer s/p radiation therapy, COPD, and recurrent aspiration. Followed by Dr. Valeta Harms in pulmonary clinic. He has PEG in place. He has recent admissions to [facility] and [facility] for recurrent aspiration most recently 8/4 when he required intubation. Also developed septic shock requiring pressors. He is supposed to use thickener to hopefully prevent aspiration, however, he has had issues obtaining/using this in the past.   8/25 he became unresponsive at home and his wife called EMS. Upon EMS arrival Mr Carmel remained unresponsive. He is on chronic opioids for pain and was given narcan by EMS with some improvement in symptoms, which was transient. Upon arrival to emergency department he was poorly responsive and remained hypoxemic requiring 15L non-rebreather to maintain adequate O2 sats. He was intubated with concern for further deterioration. Imaging demonstrated LLL opacification, which seemed to be consistent with several recent CXR. Started on low dose levophed for shock. Transferred to [facility].  He was weaned from the ventilator with improving mental status, but had worsening respiratory distress on 8/27 and required re-intubation.  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  Significant Hospital Events    8/25 admitted 8/27, extubated then re-intubated  Consults:  8/26 Palliative Care  Procedures:  ETT multiple times Trach 8/30  Significant Diagnostic Tests:    Micro Data:  COVID [facility]) 8/25 >> NEG MRSA screen 8/25>>neg Respiratory Culture 8/26>>serratia  Antimicrobials:  Ceftriaxone 8/26 x 7 days  Interim history/subjective:  No distress on trach collar  Objective   Blood pressure 115/71, pulse 96, temperature 99.1 F (37.3 C), temperature source Oral, resp. rate 15, height 5\' 6"  (1.676 m), weight 44.8 kg, SpO2 93 %.    FiO2 (%):  [28 %] 28 %   Intake/Output Summary (Last 24 hours) at 11/24/2018 1113 Last data filed at 11/24/2018 0900 Gross per 24 hour  Intake 1410 ml  Output 1250 ml  Net 160 ml   Filed Weights   11/22/18 0345 11/23/18 0500 11/24/18 0500  Weight: 48.8 kg 49.3 kg 44.8 kg   General this is a 58 year old male who looks much older than stated age she is in no acute distress currently sitting up in chair HEENT mucous membranes moist, temporal wasting, tracheostomy unremarkable Pulmonary: Scattered rhonchi no accessory use Cardiac: Regular rate and rhythm Abdomen: Soft nontender, PEG unremarkable Extremities: Warm and dry brisk cap refill Neuro: Intact   Resolved Hospital Problem list   Encephalopathy Shock  Assessment & Plan:   Acute on chronic hypoxemic/hypercabric respiratory failure likely secondary to acute serratia pneumonia on top of chronic aspiration syndrome-in pt w/ head and neck cancer.   -Completed antibiotics -Looks comfortable on tracheostomy size 6 Plan Continue routine tracheostomy care Can remove tracheostomy sutures after 9/6  Mobilize  Supplemental oxygen  SLP working on [employer] valve trials, he may  do a little better with this once transition to cuffless  He is a small man, we may be able to consider downsizing to size 4 eventually, but I do not think he is a candidate for decannulation  Protein calorie  malnutrition- moderate to severe Plan Continue supplemental replacement  Questionable COPD diagnosis- no PFTs on file, no noticeable benefit to breathing with nebs Plan Continue PRN's  Chronic pain- previously on methadone but has wanted off it for a while; on standing Plan Continuing oxycodone/Tylenol/gabapentin     Best practice:  Diet: Tube feeds Pain/Anxiety/Delirium protocol (if indicated): see above VAP protocol (if indicated): Per prot DVT prophylaxis: lovenox GI prophylaxis: PPI Glucose control: SSI Mobility: BR Code Status: DNR Family Communication: will call Disposition: Okay to move out of the intensive care we will continue to follow for tracheostomy needs, will see again on Friday  Anaya Bovee E Alley Neils ACNP-BC [place] Pager # [phone] OR # [phone] if no answer

## 2018-11-24 NOTE — Progress Notes (Signed)
Occupational Therapy Treatment Patient Details Name: Tony Berry MRN: [id number] DOB: 03-Aug-1960 Today's Date: 11/24/2018    History of present illness 58 year old male with PMH head and neck cancer s/p radiation therapy, COPD, and recurrent aspiration. He has PEG in place. He has recent admissions to [facility] and [facility] for recurrent aspiration most recently 8/4 when he required intubation.8/25 he became unresponsive at home and his wife called EMS. Found to haveAcute on chronic hypoxemic/hypercabric respiratory failure likely secondary to chronic aspiration, high dose Methadone and underlying emphysema andAcute encephalopathy secondary to acute on chronic respiratory failure. Intubated 8/25, extubated 8/27 and reintubated same day   OT comments  Pt in urine soaked bed, but had not alerted nursing. Assisted OOB to chair and with wiping off skin with min to min guard assist. Pt also performed seated grooming with set up. Pt demonstrated ability to don socks with set up at EOB. Progressing steadily.  Follow Up Recommendations  SNF;Supervision/Assistance - 24 hour    Equipment Recommendations       Recommendations for Other Services      Precautions / Restrictions Precautions Precautions: Fall Precaution Comments: PEG tube baseline; new tracheostomy Restrictions Weight Bearing Restrictions: No       Mobility Bed Mobility Overal bed mobility: Needs Assistance Bed Mobility: Supine to Sit     Supine to sit: Supervision     General bed mobility comments: for safety and lines  Transfers Overall transfer level: Needs assistance Equipment used: None Transfers: Sit to/from Stand;Stand Pivot Transfers Sit to Stand: Min guard Stand pivot transfers: Min guard       General transfer comment: assist for lines, min guard assist, pt with limp due to leg length discrepancy    Balance Overall balance assessment: Needs assistance   Sitting balance-Leahy Scale:  Good Sitting balance - Comments: no LOB with donning socks   Standing balance support: No upper extremity supported Standing balance-Leahy Scale: Poor Standing balance comment: min guard, pt with leg length discrepancy                           ADL either performed or assessed with clinical judgement   ADL Overall ADL's : Needs assistance/impaired     Grooming: Brushing hair;Wash/dry hands;Wash/dry face;Sitting;Set up               Lower Body Dressing: Set up;Sitting/lateral leans Lower Body Dressing Details (indicate cue type and reason): socks Toilet Transfer: Min guard;Stand-pivot;BSC   Toileting- Clothing Manipulation and Hygiene: Minimal assistance;Sit to/from stand               Vision       Perception     Praxis      Cognition Arousal/Alertness: Awake/alert Behavior During Therapy: Flat affect Overall Cognitive Status: Difficult to assess                                          Exercises     Shoulder Instructions       General Comments      Pertinent Vitals/ Pain       Pain Assessment: Faces Faces Pain Scale: Hurts a little bit Pain Location: throat Pain Descriptors / Indicators: Sore Pain Intervention(s): Monitored during session;Repositioned;Premedicated before session  Home Living  Prior Functioning/Environment              Frequency  Min 2X/week        Progress Toward Goals  OT Goals(current goals can now be found in the care plan section)  Progress towards OT goals: Progressing toward goals  Acute Rehab OT Goals Patient Stated Goal: unable to state OT Goal Formulation: With patient Time For Goal Achievement: 12/03/18 Potential to Achieve Goals: Good  Plan Discharge plan remains appropriate    Co-evaluation                 AM-PAC OT "6 Clicks" Daily Activity     Outcome Measure   Help from another person eating  meals?: Total Help from another person taking care of personal grooming?: A Little Help from another person toileting, which includes using toliet, bedpan, or urinal?: A Little Help from another person bathing (including washing, rinsing, drying)?: A Little Help from another person to put on and taking off regular upper body clothing?: A Little Help from another person to put on and taking off regular lower body clothing?: A Little 6 Click Score: 16    End of Session Equipment Utilized During Treatment: Gait belt;Oxygen  OT Visit Diagnosis: Other abnormalities of gait and mobility (R26.89);Muscle weakness (generalized) (M62.81)   Activity Tolerance Patient tolerated treatment well   Patient Left in chair;with call bell/phone within reach;with chair alarm set   Nurse Communication          Time: [phone] OT Time Calculation (min): 18 min  Charges: OT General Charges $OT Visit: 1 Visit OT Treatments $Self Care/Home Management : 8-22 mins  Nestor Lewandowsky, OTR/L Acute Rehabilitation Services Pager: [phone] Office: [phone]   Malka So 11/24/2018, 9:19 AM

## 2018-11-25 DIAGNOSIS — Z9289 Personal history of other medical treatment: Secondary | ICD-10-CM

## 2018-11-25 DIAGNOSIS — E43 Unspecified severe protein-calorie malnutrition: Secondary | ICD-10-CM

## 2018-11-25 LAB — GLUCOSE, CAPILLARY
Glucose-Capillary: 113 mg/dL — ABNORMAL HIGH (ref 70–99)
Glucose-Capillary: 106 mg/dL — ABNORMAL HIGH (ref 70–99)
Glucose-Capillary: 98 mg/dL (ref 70–99)

## 2018-11-25 MED ORDER — ENOXAPARIN SODIUM 30 MG/0.3ML ~~LOC~~ SOLN
30.0000 mg | SUBCUTANEOUS | Status: DC
  Administered 2018-11-26 – 2018-12-13 (×15): 30 mg via SUBCUTANEOUS
  Filled 2018-11-25 (×17): qty 0.3

## 2018-11-25 NOTE — Progress Notes (Signed)
Physical Therapy Treatment Patient Details Name: Tony Berry MRN: [id number] DOB: [birth year] Today's Date: 11/25/2018    History of Present Illness 58 year old male with PMH head and neck cancer s/p radiation therapy, COPD, and recurrent aspiration. He has PEG in place. He has recent admissions to [facility] and [facility] for recurrent aspiration most recently 8/4 when he required intubation.8/25 he became unresponsive at home and his wife called EMS. Found to haveAcute on chronic hypoxemic/hypercabric respiratory failure likely secondary to chronic aspiration, high dose Methadone and underlying emphysema andAcute encephalopathy secondary to acute on chronic respiratory failure. Intubated 8/25, extubated 8/27 and reintubated same day. Trached 8/30 and now on trach collar    PT Comments    Pt making steady progress. Continues to have poor activity tolerance. Continue to recommend SNF.    Follow Up Recommendations  SNF     Equipment Recommendations  None recommended by PT    Recommendations for Other Services       Precautions / Restrictions Precautions Precautions: Fall Precaution Comments: PEG tube baseline; new tracheostomy Restrictions Weight Bearing Restrictions: No    Mobility  Bed Mobility Overal bed mobility: Needs Assistance Bed Mobility: Supine to Sit     Supine to sit: Supervision     General bed mobility comments: for safety and lines  Transfers Overall transfer level: Needs assistance Equipment used: 4-wheeled walker Transfers: Sit to/from Stand Sit to Stand: Min assist         General transfer comment: Assist for balance  Ambulation/Gait Ambulation/Gait assistance: Min assist;+2 safety/equipment Gait Distance (Feet): 30 Feet Assistive device: 4-wheeled walker Gait Pattern/deviations: Step-through pattern;Decreased stride length;Trunk flexed     General Gait Details: Assist for balance and support. Pt amb on RA with trach. Fatigued  quickly and asked to returned to room.   Stairs             Wheelchair Mobility    Modified Rankin (Stroke Patients Only)       Balance Overall balance assessment: Needs assistance Sitting-balance support: No upper extremity supported;Feet supported Sitting balance-Leahy Scale: Good     Standing balance support: Bilateral upper extremity supported Standing balance-Leahy Scale: Poor Standing balance comment: rollator and min guard for static standing                            Cognition Arousal/Alertness: Awake/alert Behavior During Therapy: Flat affect Overall Cognitive Status: Difficult to assess                                 General Comments: Followed 1 step commands.Writing statements/questions. Bed soaked with urine and pt had not alerted anyone      Exercises      General Comments        Pertinent Vitals/Pain Pain Assessment: Faces Faces Pain Scale: Hurts a little bit Pain Location: stomach Pain Descriptors / Indicators: Grimacing Pain Intervention(s): Limited activity within patient's tolerance;Monitored during session    Home Living                      Prior Function            PT Goals (current goals can now be found in the care plan section) Acute Rehab PT Goals Patient Stated Goal: unable to state Progress towards PT goals: Progressing toward goals    Frequency    Min  2X/week      PT Plan Current plan remains appropriate    Co-evaluation              AM-PAC PT "6 Clicks" Mobility   Outcome Measure  Help needed turning from your back to your side while in a flat bed without using bedrails?: A Little Help needed moving from lying on your back to sitting on the side of a flat bed without using bedrails?: A Little Help needed moving to and from a bed to a chair (including a wheelchair)?: A Little Help needed standing up from a chair using your arms (e.g., wheelchair or bedside chair)?: A  Little Help needed to walk in hospital room?: A Little   6 Click Score: 15    End of Session Equipment Utilized During Treatment: Gait belt Activity Tolerance: Patient limited by fatigue Patient left: in chair;with call bell/phone within reach;with chair alarm set Nurse Communication: Mobility status PT Visit Diagnosis: Other abnormalities of gait and mobility (R26.89);Muscle weakness (generalized) (M62.81);Adult, failure to thrive (R62.7)     Time: [id number] PT Time Calculation (min) (ACUTE ONLY): 24 min  Charges:  $Gait Training: 23-37 mins                     [place] Pager [phone] Office [place] 11/25/2018, 3:54 PM

## 2018-11-25 NOTE — Progress Notes (Signed)
PROGRESS NOTE    Tony Berry   [id number]  DOB: Jul 28, 1960  DOA: 11/16/2018 PCP: Tony Nakai, MD   Brief Narrative:  Tony Berry is a 58 year old male with a history of head and neck cancer who is status post radiation therapy and has issues with recurrent aspiration pneumonia,  PEG tube with severe protein calorie malnutrition, on chronic opioids for pain. The patient was found unresponsive by family with agonal breathing.  He was intubated at the [facility] ED and transferred to [facility].  In the ICU, he was treated for pneumonia and septic shock. He was extubated on 8/27 but had to be reintubated the same day due to severe hypoxia.  He underwent tracheostomy on 8/30.  Subjective: The patient has no complaints this morning  Assessment & Plan:   Principal Problem:   Acute on chronic respiratory failure with hypoxia /  Tracheostomy tube present  ?  History of COPD -The patient has problems with chronic aspiration and apparently has been intubated multiple times -He currently has a cuffed #6 Shiley tracheostomy and is working with a Industrial/product designer -Per pulmonary critical care, tracheostomy sutures can be removed on 9/6 -He is DNR as of 8/31  Active Problems:    S/P percutaneous endoscopic gastrostomy (PEG) tube placement /dysphagia   Protein-calorie malnutrition, severe -Continue tube feeds     Head and neck cancer  -Status post radiation -Is receiving oxycodone 10 mg Q4hrs and  15 mg Q4 PRN for pain-also receiving Neurontin  Anxiety The patient is receiving Klonopin daily and Seroquel nightly   Time spent in minutes: 35 DVT prophylaxis: Lovenox Code Status: DO NOT RESUSCITATE Family Communication:  Disposition Plan: Awaiting skilled nursing facility bed Consultants:   Pulmonary critical care  Palliative care Procedures:   Intubation/extubation  Tracheostomy Antimicrobials:  Anti-infectives (From admission, onward)   Start     Dose/Rate Route  Frequency Ordered Stop   11/19/18 2000  vancomycin (VANCOCIN) IVPB 750 mg/150 ml premix  Status:  Discontinued     750 mg 150 mL/hr over 60 Minutes Intravenous Every 12 hours 11/19/18 0814 11/19/18 1550   11/19/18 0815  vancomycin (VANCOCIN) IVPB 1000 mg/200 mL premix     1,000 mg 200 mL/hr over 60 Minutes Intravenous  Once 11/19/18 0813 11/19/18 1024   11/17/18 0945  cefTRIAXone (ROCEPHIN) 2 g in sodium chloride 0.9 % 100 mL IVPB     2 g 200 mL/hr over 30 Minutes Intravenous Every 24 hours 11/17/18 0934 11/21/18 0904       Objective: Vitals:   11/25/18 0800 11/25/18 0840 11/25/18 0900 11/25/18 1000  BP: 93/63 93/63 99/61  106/83  Pulse: 85 86 83 83  Resp: 15 14 14 16   Temp:      TempSrc:      SpO2: 95% 95% 95% 98%  Weight:      Height:        Intake/Output Summary (Last 24 hours) at 11/25/2018 1121 Last data filed at 11/25/2018 0600 Gross per 24 hour  Intake 1235 ml  Output 1350 ml  Net -115 ml   Filed Weights   11/23/18 0500 11/24/18 0500 11/25/18 0317  Weight: 49.3 kg 44.8 kg 46.1 kg    Examination: General exam: Appears comfortable  HEENT: PERRLA, oral mucosa moist, no sclera icterus or thrush Respiratory system: Clear to auscultation. Respiratory effort normal. Trach present Cardiovascular system: S1 & S2 heard, RRR.   Gastrointestinal system: Abdomen soft, non-tender, nondistended. Normal bowel sounds. PEG tube present Central  nervous system: Alert and oriented. No focal neurological deficits. Extremities: No cyanosis, clubbing or edema Skin: No rashes or ulcers Psychiatry:  Mood & affect appropriate.     Data Reviewed: I have personally reviewed following labs and imaging studies  CBC: Recent Labs  Lab 11/18/18 1401 11/19/18 0307 11/20/18 0329 11/20/18 1716  WBC  --  13.5* 8.5  --   HGB 11.6* 9.1* 8.9* 9.5*  HCT 34.0* 30.2* 29.8* 28.0*  MCV  --  90.7 92.3  --   PLT  --  191 182  --    Basic Metabolic Panel: Recent Labs  Lab 11/18/18 1401  11/19/18 0307 11/20/18 0329 11/20/18 1716 11/23/18 0346  NA 131* 133* 136 139 135  K 3.5 3.4* 3.9 4.1 3.9  CL  --  88* 95*  --  97*  CO2  --  38* 35*  --  30  GLUCOSE  --  156* 103*  --  102*  BUN  --  18 15  --  24*  CREATININE  --  0.42* 0.34*  --  0.32*  CALCIUM  --  7.9* 8.0*  --  8.4*  MG  --  1.7  --   --   --   PHOS  --  3.4  --   --   --    GFR: Estimated Creatinine Clearance: 66.4 mL/min (A) (by C-G formula based on SCr of 0.32 mg/dL (L)). Liver Function Tests: No results for input(s): AST, ALT, ALKPHOS, BILITOT, PROT, ALBUMIN in the last 168 hours. No results for input(s): LIPASE, AMYLASE in the last 168 hours. No results for input(s): AMMONIA in the last 168 hours. Coagulation Profile: No results for input(s): INR, PROTIME in the last 168 hours. Cardiac Enzymes: No results for input(s): CKTOTAL, CKMB, CKMBINDEX, TROPONINI in the last 168 hours. BNP (last 3 results) No results for input(s): PROBNP in the last 8760 hours. HbA1C: No results for input(s): HGBA1C in the last 72 hours. CBG: Recent Labs  Lab 11/24/18 1537 11/24/18 2018 11/24/18 2336 11/25/18 0305 11/25/18 0755  GLUCAP 117* 115* 125* 113* 106*   Lipid Profile: No results for input(s): CHOL, HDL, LDLCALC, TRIG, CHOLHDL, LDLDIRECT in the last 72 hours. Thyroid Function Tests: No results for input(s): TSH, T4TOTAL, FREET4, T3FREE, THYROIDAB in the last 72 hours. Anemia Panel: No results for input(s): VITAMINB12, FOLATE, FERRITIN, TIBC, IRON, RETICCTPCT in the last 72 hours. Urine analysis:    Component Value Date/Time   COLORURINE YELLOW 11/17/2018 0100   APPEARANCEUR CLOUDY (A) 11/17/2018 0100   LABSPEC 1.011 11/17/2018 0100   PHURINE 8.0 11/17/2018 0100   GLUCOSEU NEGATIVE 11/17/2018 0100   HGBUR NEGATIVE 11/17/2018 0100   BILIRUBINUR NEGATIVE 11/17/2018 0100   KETONESUR NEGATIVE 11/17/2018 0100   PROTEINUR NEGATIVE 11/17/2018 0100   UROBILINOGEN 0.2 05/20/2007 0952   NITRITE NEGATIVE  11/17/2018 0100   LEUKOCYTESUR TRACE (A) 11/17/2018 0100   Sepsis Labs: @LABRCNTIP (procalcitonin:4,lacticidven:4) ) Recent Results (from the past 240 hour(s))  MRSA PCR Screening     Status: None   Collection Time: 11/16/18 10:54 PM   Specimen: Nasopharyngeal  Result Value Ref Range Status   MRSA by PCR NEGATIVE NEGATIVE Final    Comment:        The GeneXpert MRSA Assay (FDA approved for NASAL specimens only), is one component of a comprehensive MRSA colonization surveillance program. It is not intended to diagnose MRSA infection nor to guide or monitor treatment for MRSA infections. Performed at [facility], [place]  [address], [place], [place] [address]   Culture, blood (routine x 2)     Status: None   Collection Time: 11/17/18 10:48 AM   Specimen: BLOOD RIGHT ARM  Result Value Ref Range Status   Specimen Description BLOOD RIGHT ARM  Final   Special Requests   Final    BOTTLES DRAWN AEROBIC AND ANAEROBIC Blood Culture adequate volume   Culture   Final    NO GROWTH 5 DAYS Performed at [facility], [address], [place], [place] [address]    Report Status 11/22/2018 FINAL  Final  Culture, blood (routine x 2)     Status: None   Collection Time: 11/17/18 10:49 AM   Specimen: BLOOD LEFT ARM  Result Value Ref Range Status   Specimen Description BLOOD LEFT ARM  Final   Special Requests   Final    BOTTLES DRAWN AEROBIC ONLY Blood Culture adequate volume   Culture   Final    NO GROWTH 5 DAYS Performed at [facility], [place] [address], [place], [place] [address]    Report Status 11/22/2018 FINAL  Final  Culture, respiratory (non-expectorated)     Status: None   Collection Time: 11/17/18 10:57 AM   Specimen: Tracheal Aspirate; Respiratory  Result Value Ref Range Status   Specimen Description TRACHEAL ASPIRATE  Final   Special Requests Normal  Final   Gram Stain   Final    RARE WBC PRESENT, PREDOMINANTLY PMN RARE GRAM POSITIVE COCCI Performed at [facility], [place] [address], [place], [place] [address]    Culture FEW SERRATIA MARCESCENS  Final   Report Status 11/21/2018 FINAL  Final   Organism ID, Bacteria SERRATIA MARCESCENS  Final      Susceptibility   Serratia marcescens - MIC*    CEFAZOLIN >=64 RESISTANT Resistant     CEFEPIME <=1 SENSITIVE Sensitive     CEFTAZIDIME <=1 SENSITIVE Sensitive     CEFTRIAXONE <=1 SENSITIVE Sensitive     CIPROFLOXACIN <=0.25 SENSITIVE Sensitive     GENTAMICIN <=1 SENSITIVE Sensitive     TRIMETH/SULFA <=20 SENSITIVE Sensitive     * FEW SERRATIA MARCESCENS         Radiology Studies: No results found.    Scheduled Meds: . acetaminophen (TYLENOL) oral liquid 160 mg/5 mL  650 mg Per Tube Q6H  . aspirin  81 mg Per Tube Daily  . chlorhexidine  15 mL Mouth Rinse BID  . Chlorhexidine Gluconate Cloth  6 each Topical Daily  . enoxaparin (LOVENOX) injection  40 mg Subcutaneous Q24H  . feeding supplement (VITAL AF 1.2 CAL)  1,000 mL Per Tube Q24H  . gabapentin  300 mg Per Tube Q8H  . mouth rinse  15 mL Mouth Rinse q12n4p  . oxyCODONE  15 mg Per Tube Q4H  . pantoprazole sodium  40 mg Per Tube QHS  . QUEtiapine  50 mg Oral QHS  . senna-docusate  1 tablet Per Tube Daily   Continuous Infusions: . sodium chloride Stopped (11/20/18 0853)  . sodium chloride Stopped (11/22/18 1900)     LOS: 9 days      Debbe Odea, MD [facility] Pager: [email or web address] Password [email or web address] 11/25/2018, 11:21 AM

## 2018-11-25 NOTE — Progress Notes (Signed)
Chaplain received notification from [name] that he had been struggling emotionally. Nurse said pt had been very angry that he was intubated. He did not want a trachiotomy. Tony Berry has been begging the staff to overdose him on medications so that his life will end. He is tired of living in so much pain. Chaplain spoke with [facility] briefly and discovered that he is [name], but does not want his pastor to call. He did request a Bible, and the chaplain provided Tony Berry with one. Tony Berry said, "Thak you." Chaplain prayed with [facility] and will check in on him as needed.   Chaplain Resident, Evelene Croon, M. Div. Pager # [phone]

## 2018-11-26 LAB — GLUCOSE, CAPILLARY
Glucose-Capillary: 95 mg/dL (ref 70–99)
Glucose-Capillary: 120 mg/dL — ABNORMAL HIGH (ref 70–99)
Glucose-Capillary: 116 mg/dL — ABNORMAL HIGH (ref 70–99)

## 2018-11-26 MED ORDER — OSMOLITE 1.5 CAL PO LIQD
356.0000 mL | Freq: Four times a day (QID) | ORAL | Status: DC
  Administered 2018-11-27 – 2018-12-04 (×18): 356 mL
  Filled 2018-11-26 (×32): qty 474

## 2018-11-26 MED ORDER — FREE WATER
120.0000 mL | Freq: Four times a day (QID) | Status: DC
  Administered 2018-11-27 – 2018-12-08 (×39): 120 mL
  Administered 2018-12-08: 60 mL
  Administered 2018-12-08 – 2018-12-13 (×18): 120 mL

## 2018-11-26 NOTE — Progress Notes (Signed)
  Speech Language Pathology Treatment: [name] Speaking valve  Patient Details Name: Tony Berry MRN: [id number] DOB: 10-06-1960 Today's Date: 11/26/2018 Time: [id number] SLP Time Calculation (min) (ACUTE ONLY): 10 min  Assessment / Plan / Recommendation Clinical Impression  Pt participated in trials of PMV.  PMV placed for 10 to 15 seconds at a time.  No change in vital signs noted during or immediately after these brief trials, but pt indicated inability to pass air and breath stacking was noted on removal of valve.  With effort pt attempted to phonate with wet gurgling sounds noted.  Pt denied that he was getting any air out. Pt attempted to cough with valve in place and SLP orally suctioned significant amount of secretions.  Phonatory attempt improved following this. On second cough trial, less secretions were removed.  Pt still unable to generate good voicing and denied passage of air through upper airway.   Immediately prior to SLP departure, pt indicated he wanted to say something.  With thumb occlusion, pt whisper short phrase to SLP which was successfully understood on second attempt.  Pt appeared pleased.  There is plan to change trach to Shiley 4 cuffless trach on Monday.  This will hopefully improve success with PMV.  MD has also recommend pt get out of bed to walk to facilitate discharge, which may also help with clearing secretions and improving strength overall.   HPI HPI: Patient is a 58 y.o. male with PMH: head and neck cancer s/p radiation, chronic respiratory failure on 3L O2 at baseline, HTN, hyponatremia, severe protient calorie malnutrition, PVD, dysphagia, recent admission last month for aspiration PNA but without intubation, has a PEG but continues to eat and drink and reportedly not following SLP's recommendations. Most recent MBS was completed 03/12/18 as inpatient with results: chronic pharyngeal dysphagia  with consistent penetration of all bolus consistencies into larynx;  portion of penetrates passed through vocal folds and into trachea resulting in sensed aspiration. As nectar thick liquids were tolerated slightly better than thin liquids, recommendation was for Dysphagia 2 (fine chop), nectar thick liquids, with additional recommendation for ENT repeat laryngoscopy.      SLP Plan  Continue with current plan of care       Recommendations         Patient may use Passy-Muir Speech Valve: with SLP only PMSV Supervision: Full MD: Please consider changing trach tube to : Smaller size;Cuffless         Oral Care Recommendations: Oral care QID;Staff/trained caregiver to provide oral care Follow up Recommendations: (Continue ST at next level of care) SLP Visit Diagnosis: Aphonia (R49.1) Plan: Continue with current plan of care       [place], [place], [place] Office: [phone] 11/26/2018, 10:06 AM

## 2018-11-26 NOTE — Progress Notes (Signed)
NAME:  Tony Berry, MRN:  [id number], DOB:  08/31/1960, LOS: 84 ADMISSION DATE:  11/16/2018, CONSULTATION DATE:  8/25 REFERRING MD:  [facility], CHIEF COMPLAINT:  Dypsea   Brief History   58 year old male with head and neck cancer s/p radiation therapy complicated by dysphagia and recurrent aspiration. [facility] to [name]25 from [facility] ED where he required intubation in the setting of AMS and hypoxemic respiratory failure. Extubated 8/27 and shortly thereafter required re-intubation.   History of present illness   58 year old male with PMH as below, which is significant for head and neck cancer s/p radiation therapy, COPD, and recurrent aspiration. Followed by Dr. Valeta Harms in pulmonary clinic. He has PEG in place. He has recent admissions to [facility] for recurrent aspiration most recently 8/4 when he required intubation. Also developed septic shock requiring pressors. He is supposed to use thickener to hopefully prevent aspiration, however, he has had issues obtaining/using this in the past.   8/25 he became unresponsive at home and his wife called EMS. Upon EMS arrival Mr Silos remained unresponsive. He is on chronic opioids for pain and was given narcan by EMS with some improvement in symptoms, which was transient. Upon arrival to emergency department he was poorly responsive and remained hypoxemic requiring 15L non-rebreather to maintain adequate O2 sats. He was intubated with concern for further deterioration. Imaging demonstrated LLL opacification, which seemed to be consistent with several recent CXR. Started on low dose levophed for shock. Transferred to [facility].  He was weaned from the ventilator with improving mental status, but had worsening respiratory distress on 8/27 and required re-intubation.  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  Significant Hospital Events    8/25 admitted 8/27, extubated then re-intubated  Consults:  8/26 Palliative Care  Procedures:  ETT multiple times Trach 8/30  Significant Diagnostic Tests:    Micro Data:  COVID [facility]) 8/25 >> NEG MRSA screen 8/25>>neg Respiratory Culture 8/26>>serratia  Antimicrobials:  Ceftriaxone 8/26 x 7 days  Interim history/subjective:  No distress wants to go home  Objective   Blood pressure 104/63, pulse 87, temperature 98.2 F (36.8 C), temperature source Oral, resp. rate 17, height 5\' 6"  (1.676 m), weight 46 kg, SpO2 94 %.    FiO2 (%):  [28 %] 28 %   Intake/Output Summary (Last 24 hours) at 11/26/2018 0853 Last data filed at 11/26/2018 0600 Gross per 24 hour  Intake 1560 ml  Output no documentation  Net 1560 ml   Filed Weights   11/24/18 0500 11/25/18 0317 11/26/18 0438  Weight: 44.8 kg 46.1 kg 46 kg   General this is a 58 year old chronically ill appearing white male he is sitting up in the bed and in no acute distress HEENT normocephalic atraumatic he does have some temporal wasting his mucous membranes are moist his size 6 tracheostomy is cuffed the cuff is deflated site is unremarkable Pulmonary: Coarse scattered rhonchi does have a rhonchorous cough Cardiac: Regular rate and rhythm Abdomen: Soft, not tender, PEG site is unremarkable Extremities: Warm and dry brisk capillary refill strong pulses Neuro: Awake oriented no focal deficits moves all extremities GU: Voids  Resolved Hospital Problem list   Encephalopathy Shock  Assessment & Plan:   Acute on chronic hypoxemic/hypercabric respiratory failure likely secondary to acute serratia pneumonia on top of chronic aspiration syndrome-in pt w/ head and neck cancer. Protein  calorie malnutrition Probable COPD Chronic pain   Discussion -Completed antibiotics -Looks comfortable on tracheostomy size 6 -Mr. Castles's tracheostomy is still quite fresh.  I like to give him a couple more days to let the stoma mature.  I  am also concerned about his current tracheal secretions.  He is anxious to leave the hospital but we need to get the sutures at the tracheostomy and also make sure he is proficient at taking care of his trach.  He is actively working with SLP therapy with Passy-Muir valve.  I suspect the optimal plan for him will be to get him home with a 4 cuffless trach my plan will be to change this on Monday    Plan Continue routine tracheostomy care We will plan on removing sutures and changing trach out to size 4 cuffless on 9/6 this may also help him with PMV work Continue to keep him nothing by mouth, I do think he can have ice chips as long as he does not cough or have difficulty with this he understands he is at risk for recurrent aspiration Yet Continue tube feeds Continue bronchodilators as needed I think he could probably go home early next week but we need to be sure he is proficient at tracheostomy care We will plan on seeing him Monday to assist with tracheostomy change   Erick Colace ACNP-BC [place] Pager # [phone] OR # [phone] if no answer

## 2018-11-26 NOTE — Progress Notes (Signed)
PROGRESS NOTE    Tony Berry   [id number]  DOB: 1960/04/10  DOA: 11/16/2018 PCP: Cher Nakai, MD   Brief Narrative:  Tony Berry is a 58 year old male with a history of head and neck cancer who is status post radiation therapy and has issues with recurrent aspiration pneumonia,  PEG tube with severe protein calorie malnutrition, on chronic opioids for pain. The patient was found unresponsive by family with agonal breathing.  He was intubated at the [facility] ED and transferred to [facility].  In the ICU, he was treated for pneumonia and septic shock. He was extubated on 8/27 but had to be reintubated the same day due to severe hypoxia.  He underwent tracheostomy on 8/30.  Subjective: The patient has no complaints this morning  Assessment & Plan:   Principal Problem:   Acute on chronic respiratory failure with hypoxia /  Tracheostomy tube present  ?  History of COPD -The patient has problems with chronic aspiration and apparently has been intubated multiple times -He currently has a cuffed #6 Shiley tracheostomy and is working with a Industrial/product designer -Per pulmonary critical care, tracheostomy sutures can be removed on 9/6 -He is DNR as of 8/31 - he is receiving trach teaching   Active Problems:    S/P percutaneous endoscopic gastrostomy (PEG) tube placement /dysphagia   Protein-calorie malnutrition, severe -Continue tube feeds    Head and neck cancer  -Status post radiation -Is receiving oxycodone 10 mg Q4hrs and  15 mg Q4 PRN for pain-also receiving Neurontin  Anxiety The patient is receiving Klonopin daily and Seroquel nightly   Time spent in minutes: 35 DVT prophylaxis: Lovenox Code Status: DO NOT RESUSCITATE Family Communication:  Disposition Plan: Awaiting skilled nursing facility bed Consultants:   Pulmonary critical care  Palliative care Procedures:   Intubation/extubation  Tracheostomy Antimicrobials:  Anti-infectives (From admission, onward)    Start     Dose/Rate Route Frequency Ordered Stop   11/19/18 2000  vancomycin (VANCOCIN) IVPB 750 mg/150 ml premix  Status:  Discontinued     750 mg 150 mL/hr over 60 Minutes Intravenous Every 12 hours 11/19/18 0814 11/19/18 1550   11/19/18 0815  vancomycin (VANCOCIN) IVPB 1000 mg/200 mL premix     1,000 mg 200 mL/hr over 60 Minutes Intravenous  Once 11/19/18 0813 11/19/18 1024   11/17/18 0945  cefTRIAXone (ROCEPHIN) 2 g in sodium chloride 0.9 % 100 mL IVPB     2 g 200 mL/hr over 30 Minutes Intravenous Every 24 hours 11/17/18 0934 11/21/18 0904       Objective: Vitals:   11/26/18 0900 11/26/18 1000 11/26/18 1143 11/26/18 1508  BP: 108/61 97/62    Pulse: 90 83 81 84  Resp: (!) 22 18 20 18   Temp:      TempSrc:      SpO2: 96% 96% 98%   Weight:      Height:        Intake/Output Summary (Last 24 hours) at 11/26/2018 1622 Last data filed at 11/26/2018 1333 Gross per 24 hour  Intake 1560 ml  Output 250 ml  Net 1310 ml   Filed Weights   11/24/18 0500 11/25/18 0317 11/26/18 0438  Weight: 44.8 kg 46.1 kg 46 kg    Examination: General exam: Appears comfortable  HEENT: PERRLA, oral mucosa moist, no sclera icterus or thrush- trach in place Respiratory system: Clear to auscultation. Respiratory effort normal. Cardiovascular system: S1 & S2 heard,  No murmurs  Gastrointestinal system: Abdomen soft,  non-tender, nondistended. Normal bowel sounds PEG tube in place  Central nervous system: Alert and oriented. No focal neurological deficits. Extremities: No cyanosis, clubbing or edema Skin: No rashes or ulcers Psychiatry:  Mood & affect appropriate.    Data Reviewed: I have personally reviewed following labs and imaging studies  CBC: Recent Labs  Lab 11/20/18 0329 11/20/18 1716  WBC 8.5  --   HGB 8.9* 9.5*  HCT 29.8* 28.0*  MCV 92.3  --   PLT 182  --    Basic Metabolic Panel: Recent Labs  Lab 11/20/18 0329 11/20/18 1716 11/23/18 0346  NA 136 139 135  K 3.9 4.1 3.9  CL  95*  --  97*  CO2 35*  --  30  GLUCOSE 103*  --  102*  BUN 15  --  24*  CREATININE 0.34*  --  0.32*  CALCIUM 8.0*  --  8.4*   GFR: Estimated Creatinine Clearance: 66.3 mL/min (A) (by C-G formula based on SCr of 0.32 mg/dL (L)). Liver Function Tests: No results for input(s): AST, ALT, ALKPHOS, BILITOT, PROT, ALBUMIN in the last 168 hours. No results for input(s): LIPASE, AMYLASE in the last 168 hours. No results for input(s): AMMONIA in the last 168 hours. Coagulation Profile: No results for input(s): INR, PROTIME in the last 168 hours. Cardiac Enzymes: No results for input(s): CKTOTAL, CKMB, CKMBINDEX, TROPONINI in the last 168 hours. BNP (last 3 results) No results for input(s): PROBNP in the last 8760 hours. HbA1C: No results for input(s): HGBA1C in the last 72 hours. CBG: Recent Labs  Lab 11/25/18 0755 11/25/18 1156 11/26/18 0758 11/26/18 1158 11/26/18 1548  GLUCAP 106* 98 95 120* 116*   Lipid Profile: No results for input(s): CHOL, HDL, LDLCALC, TRIG, CHOLHDL, LDLDIRECT in the last 72 hours. Thyroid Function Tests: No results for input(s): TSH, T4TOTAL, FREET4, T3FREE, THYROIDAB in the last 72 hours. Anemia Panel: No results for input(s): VITAMINB12, FOLATE, FERRITIN, TIBC, IRON, RETICCTPCT in the last 72 hours. Urine analysis:    Component Value Date/Time   COLORURINE YELLOW 11/17/2018 0100   APPEARANCEUR CLOUDY (A) 11/17/2018 0100   LABSPEC 1.011 11/17/2018 0100   PHURINE 8.0 11/17/2018 0100   GLUCOSEU NEGATIVE 11/17/2018 0100   HGBUR NEGATIVE 11/17/2018 0100   BILIRUBINUR NEGATIVE 11/17/2018 0100   KETONESUR NEGATIVE 11/17/2018 0100   PROTEINUR NEGATIVE 11/17/2018 0100   UROBILINOGEN 0.2 05/20/2007 0952   NITRITE NEGATIVE 11/17/2018 0100   LEUKOCYTESUR TRACE (A) 11/17/2018 0100   Sepsis Labs: @LABRCNTIP (procalcitonin:4,lacticidven:4) ) Recent Results (from the past 240 hour(s))  MRSA PCR Screening     Status: None   Collection Time: 11/16/18 10:54 PM    Specimen: Nasopharyngeal  Result Value Ref Range Status   MRSA by PCR NEGATIVE NEGATIVE Final    Comment:        The GeneXpert MRSA Assay (FDA approved for NASAL specimens only), is one component of a comprehensive MRSA colonization surveillance program. It is not intended to diagnose MRSA infection nor to guide or monitor treatment for MRSA infections. Performed at [facility], [place] [address], [place], [place] [address]   Culture, blood (routine x 2)     Status: None   Collection Time: 11/17/18 10:48 AM   Specimen: BLOOD RIGHT ARM  Result Value Ref Range Status   Specimen Description BLOOD RIGHT ARM  Final   Special Requests   Final    BOTTLES DRAWN AEROBIC AND ANAEROBIC Blood Culture adequate volume   Culture   Final  NO GROWTH 5 DAYS Performed at [facility], [place] [address] [place], [place] [address]    Report Status 11/22/2018 FINAL  Final  Culture, blood (routine x 2)     Status: None   Collection Time: 11/17/18 10:49 AM   Specimen: BLOOD LEFT ARM  Result Value Ref Range Status   Specimen Description BLOOD LEFT ARM  Final   Special Requests   Final    BOTTLES DRAWN AEROBIC ONLY Blood Culture adequate volume   Culture   Final    NO GROWTH 5 DAYS Performed at [facility], [place] [address] [place], [place] [address]    Report Status 11/22/2018 FINAL  Final  Culture, respiratory (non-expectorated)     Status: None   Collection Time: 11/17/18 10:57 AM   Specimen: Tracheal Aspirate; Respiratory  Result Value Ref Range Status   Specimen Description TRACHEAL ASPIRATE  Final   Special Requests Normal  Final   Gram Stain   Final    RARE WBC PRESENT, PREDOMINANTLY PMN RARE GRAM POSITIVE COCCI Performed at [facility], [place] [address], [place], [place] [address]    Culture FEW SERRATIA MARCESCENS  Final   Report Status 11/21/2018 FINAL  Final   Organism ID, Bacteria SERRATIA MARCESCENS  Final      Susceptibility   Serratia marcescens -  MIC*    CEFAZOLIN >=64 RESISTANT Resistant     CEFEPIME <=1 SENSITIVE Sensitive     CEFTAZIDIME <=1 SENSITIVE Sensitive     CEFTRIAXONE <=1 SENSITIVE Sensitive     CIPROFLOXACIN <=0.25 SENSITIVE Sensitive     GENTAMICIN <=1 SENSITIVE Sensitive     TRIMETH/SULFA <=20 SENSITIVE Sensitive     * FEW SERRATIA MARCESCENS         Radiology Studies: No results found.    Scheduled Meds: . acetaminophen (TYLENOL) oral liquid 160 mg/5 mL  650 mg Per Tube Q6H  . aspirin  81 mg Per Tube Daily  . chlorhexidine  15 mL Mouth Rinse BID  . Chlorhexidine Gluconate Cloth  6 each Topical Daily  . enoxaparin (LOVENOX) injection  30 mg Subcutaneous Q24H  . [START ON 11/27/2018] feeding supplement (OSMOLITE 1.5 CAL)  356 mL Per Tube QID  . [START ON 11/27/2018] free water  120 mL Per Tube QID  . gabapentin  300 mg Per Tube Q8H  . mouth rinse  15 mL Mouth Rinse q12n4p  . oxyCODONE  15 mg Per Tube Q4H  . pantoprazole sodium  40 mg Per Tube QHS  . QUEtiapine  50 mg Oral QHS  . senna-docusate  1 tablet Per Tube Daily   Continuous Infusions: . sodium chloride Stopped (11/20/18 0853)  . sodium chloride Stopped (11/22/18 1900)     LOS: 10 days      Debbe Odea, MD [facility] Pager: [email or web address] Password [email or web address] 11/26/2018, 4:22 PM

## 2018-11-26 NOTE — Progress Notes (Signed)
Nutrition Follow-up  DOCUMENTATION CODES:   Severe malnutrition in context of chronic illness, Underweight  INTERVENTION:   -D/c Vital AF 1.2  -Initiate bolus feedings of 356 ml (1.5 cans) of Osmolite 1.5 QID  60 ml free water flush before and after each TF administration  Tube feeding regimen provides 2130 kcal (100% of needs), 89 grams of protein, and 1566 ml of H2O.   NUTRITION DIAGNOSIS:   Severe Malnutrition related to chronic illness(head and neck cancer with dysphagia and aspiration, COPD) as evidenced by severe fat depletion, severe muscle depletion.  Ongoing  GOAL:   Patient will meet greater than or equal to 90% of their needs  Met with TF  MONITOR:   TF tolerance, Vent status, Labs, Weight trends, Skin  REASON FOR ASSESSMENT:   Consult, Ventilator Enteral/tube feeding initiation and management  ASSESSMENT:   58 yo male admitted with acute on chronic respiratory failure secondary to aspiration requiring intubation, septic shock. PMH includes head and neck cancer with dysphagia and aspriation s/p PEG tube, COPD, malnutrition  8/25 Admitted, Intubated 8/27 Extubated, Re-intubated 8/30 Trach placed 8/31 Trach collar  Pt remains on trach collar. He has been working with SLP for [facility] trials with slow progress. Pt remains NPO, with ice chips for comfort.   Per PCCM notes, plan for trach change to size 4 cuffless on 11/28/18.   Pt sitting up in bed, consuming ice chips without difficulty. Pt appeared frustrated at time of visit. RD made many efforts to communicate with pt to help identify needs without success. He denies wanting any food by mouth (other than ice chips, which he reports no problems with currently). He also denies any nausea, abdominal pain, or vomiting with TF.   Attempted to elicit further diet history from pt. It has been suspected that pt had been non-compliant with TF and PO diet PTA. RD made efforts to communicate with pt with close-ended  questions, head nods, and hand gestures without success. Offered pt a pen and paper to write with to help with communication, but pt refused. He continued to make a hand motion signaling a phone motion, however, unable to identify what pt meant. Asked permission from pt to obtain further history from family, however, pt refused.   Vital AF 1.2 infusing @ 65 ml/hr via PEG, which provides 1872 kcals and 117 grams protein, meeting 100% of estimated kcal and protein needs. Given that pt has been on trach collar, will transition to bolus feedings in preparation for home (per PCCM notes, may be sometime next week).   Therapies recommending SNF placement, however, it appears pt desires to go home.   Labs reviewed: CBGS: 95-113.   Diet Order:   Diet Order            Diet NPO time specified  Diet effective midnight              EDUCATION NEEDS:   Not appropriate for education at this time  Skin:  Skin Integrity Issues:: Stage II, Stage I Stage I: b/l heels; iliac crest Stage II: sacrum  Last BM:  11/26/18  Height:   Ht Readings from Last 1 Encounters:  11/16/18 _0  (1.676 m)    Weight:   Wt Readings from Last 1 Encounters:  11/26/18 46 kg    Ideal Body Weight:  67.3 kg  BMI:  Body mass index is 16.37 kg/m.  Estimated Nutritional Needs:   Kcal:  1800-2000  Protein:  90-105 grams  Fluid:  >  1.8 L    Aniruddh Ciavarella A. Jimmye Norman, RD, LDN, [employer] Registered Dietitian II Certified Diabetes Care and Education Specialist Pager: [phone] After hours Pager: [phone]

## 2018-11-27 LAB — GLUCOSE, CAPILLARY
Glucose-Capillary: 129 mg/dL — ABNORMAL HIGH (ref 70–99)
Glucose-Capillary: 139 mg/dL — ABNORMAL HIGH (ref 70–99)

## 2018-11-27 LAB — CBC
HCT: 30 % — ABNORMAL LOW (ref 39.0–52.0)
Hemoglobin: 9.2 g/dL — ABNORMAL LOW (ref 13.0–17.0)
MCH: 27.1 pg (ref 26.0–34.0)
MCHC: 30.7 g/dL (ref 30.0–36.0)
MCV: 88.2 fL (ref 80.0–100.0)
Platelets: 396 10*3/uL (ref 150–400)
RBC: 3.4 MIL/uL — ABNORMAL LOW (ref 4.22–5.81)
RDW: 13.6 % (ref 11.5–15.5)
WBC: 8.6 10*3/uL (ref 4.0–10.5)
nRBC: 0 % (ref 0.0–0.2)

## 2018-11-27 LAB — BASIC METABOLIC PANEL
Anion gap: 7 (ref 5–15)
BUN: 15 mg/dL (ref 6–20)
CO2: 30 mmol/L (ref 22–32)
Calcium: 8.6 mg/dL — ABNORMAL LOW (ref 8.9–10.3)
Chloride: 99 mmol/L (ref 98–111)
Creatinine, Ser: 0.31 mg/dL — ABNORMAL LOW (ref 0.61–1.24)
GFR calc Af Amer: >60 mL/min (ref >60)
GFR calc non Af Amer: >60 mL/min (ref >60)
Glucose, Bld: 122 mg/dL — ABNORMAL HIGH (ref 70–99)
Potassium: 3.9 mmol/L (ref 3.5–5.1)
Sodium: 136 mmol/L (ref 135–145)

## 2018-11-27 MED ORDER — PROMETHAZINE HCL 25 MG/ML IJ SOLN
12.5000 mg | Freq: Once | INTRAMUSCULAR | Status: AC | PRN
  Administered 2018-11-27: 12.5 mg via INTRAVENOUS
  Filled 2018-11-27: qty 1

## 2018-11-27 NOTE — Progress Notes (Signed)
PROGRESS NOTE    Tony Berry   [id number]  DOB: [birth year]  DOA: 11/16/2018 PCP: Cher Nakai, MD   Brief Narrative:  Tony Berry is a 58 year old male with a history of head and neck cancer who is status post radiation therapy and has issues with recurrent aspiration pneumonia,  PEG tube with severe protein calorie malnutrition, on chronic opioids for pain. The patient was found unresponsive by family with agonal breathing.  He was intubated at the [facility] ED and transferred to [facility].  In the ICU, he was treated for pneumonia and septic shock. He was extubated on 8/27 but had to be reintubated the same day due to severe hypoxia.  He underwent tracheostomy on 8/30.  Subjective: No complaints.   Assessment & Plan:   Principal Problem:   Acute on chronic respiratory failure with hypoxia /  Tracheostomy tube present  ?  History of COPD -The patient has problems with chronic aspiration and apparently has been intubated multiple times -He currently has a cuffed #6 Shiley tracheostomy and is working with a Industrial/product designer -Per pulmonary critical care, tracheostomy sutures can be removed on 9/6 -He is DNR as of 8/31 - he is receiving trach teaching   Active Problems:    S/P percutaneous endoscopic gastrostomy (PEG) tube placement /dysphagia   Protein-calorie malnutrition, severe -Continue tube feeds    Head and neck cancer  -Status post radiation -Is receiving oxycodone 10 mg Q4hrs and  15 mg Q4 PRN for pain-also receiving Neurontin  Anxiety The patient is receiving Klonopin daily and Seroquel nightly   Time spent in minutes: 35 DVT prophylaxis: Lovenox Code Status: DO NOT RESUSCITATE Family Communication:  Disposition Plan: Awaiting skilled nursing facility bed Consultants:   Pulmonary critical care  Palliative care Procedures:   Intubation/extubation  Tracheostomy Antimicrobials:  Anti-infectives (From admission, onward)   Start     Dose/Rate  Route Frequency Ordered Stop   11/19/18 2000  vancomycin (VANCOCIN) IVPB 750 mg/150 ml premix  Status:  Discontinued     750 mg 150 mL/hr over 60 Minutes Intravenous Every 12 hours 11/19/18 0814 11/19/18 1550   11/19/18 0815  vancomycin (VANCOCIN) IVPB 1000 mg/200 mL premix     1,000 mg 200 mL/hr over 60 Minutes Intravenous  Once 11/19/18 0813 11/19/18 1024   11/17/18 0945  cefTRIAXone (ROCEPHIN) 2 g in sodium chloride 0.9 % 100 mL IVPB     2 g 200 mL/hr over 30 Minutes Intravenous Every 24 hours 11/17/18 0934 11/21/18 0904       Objective: Vitals:   11/27/18 1200 11/27/18 1230 11/27/18 1234 11/27/18 1300  BP:    97/67  Pulse: 83 92  97  Resp: (!) 22 18  19   Temp:   97.6 F (36.4 C)   TempSrc:   Oral   SpO2: 100% 95%  96%  Weight:      Height:        Intake/Output Summary (Last 24 hours) at 11/27/2018 1338 Last data filed at 11/27/2018 1300 Gross per 24 hour  Intake 1866 ml  Output 600 ml  Net 1266 ml   Filed Weights   11/25/18 0317 11/26/18 0438 11/27/18 0500  Weight: 46.1 kg 46 kg 45 kg    Examination: General exam: Appears comfortable  HEENT: PERRLA, oral mucosa moist, no sclera icterus or thrush- trach in place Respiratory system: Clear to auscultation. Respiratory effort normal. Cardiovascular system: S1 & S2 heard,  No murmurs  Gastrointestinal system: Abdomen soft, non-tender,  nondistended. Normal bowel sounds  - PEG in place Central nervous system: Alert and oriented. No focal neurological deficits. Extremities: No cyanosis, clubbing or edema Skin: No rashes or ulcers Psychiatry:  Mood & affect appropriate.   Data Reviewed: I have personally reviewed following labs and imaging studies  CBC: Recent Labs  Lab 11/20/18 1716 11/27/18 0453  WBC  --  8.6  HGB 9.5* 9.2*  HCT 28.0* 30.0*  MCV  --  88.2  PLT  --  [id number]   Basic Metabolic Panel: Recent Labs  Lab 11/20/18 1716 11/23/18 0346 11/27/18 0453  NA 139 135 136  K 4.1 3.9 3.9  CL  --  97* 99   CO2  --  30 30  GLUCOSE  --  102* 122*  BUN  --  24* 15  CREATININE  --  0.32* 0.31*  CALCIUM  --  8.4* 8.6*   GFR: Estimated Creatinine Clearance: 64.8 mL/min (A) (by C-G formula based on SCr of 0.31 mg/dL (L)). Liver Function Tests: No results for input(s): AST, ALT, ALKPHOS, BILITOT, PROT, ALBUMIN in the last 168 hours. No results for input(s): LIPASE, AMYLASE in the last 168 hours. No results for input(s): AMMONIA in the last 168 hours. Coagulation Profile: No results for input(s): INR, PROTIME in the last 168 hours. Cardiac Enzymes: No results for input(s): CKTOTAL, CKMB, CKMBINDEX, TROPONINI in the last 168 hours. BNP (last 3 results) No results for input(s): PROBNP in the last 8760 hours. HbA1C: No results for input(s): HGBA1C in the last 72 hours. CBG: Recent Labs  Lab 11/26/18 0758 11/26/18 1158 11/26/18 1548 11/27/18 0834 11/27/18 1222  GLUCAP 95 120* 116* 129* 139*   Lipid Profile: No results for input(s): CHOL, HDL, LDLCALC, TRIG, CHOLHDL, LDLDIRECT in the last 72 hours. Thyroid Function Tests: No results for input(s): TSH, T4TOTAL, FREET4, T3FREE, THYROIDAB in the last 72 hours. Anemia Panel: No results for input(s): VITAMINB12, FOLATE, FERRITIN, TIBC, IRON, RETICCTPCT in the last 72 hours. Urine analysis:    Component Value Date/Time   COLORURINE YELLOW 11/17/2018 0100   APPEARANCEUR CLOUDY (A) 11/17/2018 0100   LABSPEC 1.011 11/17/2018 0100   PHURINE 8.0 11/17/2018 0100   GLUCOSEU NEGATIVE 11/17/2018 0100   HGBUR NEGATIVE 11/17/2018 0100   BILIRUBINUR NEGATIVE 11/17/2018 0100   KETONESUR NEGATIVE 11/17/2018 0100   PROTEINUR NEGATIVE 11/17/2018 0100   UROBILINOGEN 0.2 05/20/2007 0952   NITRITE NEGATIVE 11/17/2018 0100   LEUKOCYTESUR TRACE (A) 11/17/2018 0100   Sepsis Labs: @LABRCNTIP (procalcitonin:4,lacticidven:4) ) No results found for this or any previous visit (from the past 240 hour(s)).       Radiology Studies: No results found.     Scheduled Meds: . acetaminophen (TYLENOL) oral liquid 160 mg/5 mL  650 mg Per Tube Q6H  . aspirin  81 mg Per Tube Daily  . chlorhexidine  15 mL Mouth Rinse BID  . Chlorhexidine Gluconate Cloth  6 each Topical Daily  . enoxaparin (LOVENOX) injection  30 mg Subcutaneous Q24H  . feeding supplement (OSMOLITE 1.5 CAL)  356 mL Per Tube QID  . free water  120 mL Per Tube QID  . gabapentin  300 mg Per Tube Q8H  . mouth rinse  15 mL Mouth Rinse q12n4p  . oxyCODONE  15 mg Per Tube Q4H  . pantoprazole sodium  40 mg Per Tube QHS  . QUEtiapine  50 mg Oral QHS  . senna-docusate  1 tablet Per Tube Daily   Continuous Infusions: . sodium chloride Stopped (11/20/18 0853)  .  sodium chloride Stopped (11/22/18 1900)     LOS: 11 days      Debbe Odea, MD [facility] Pager: [email or web address] Password [email or web address] 11/27/2018, 1:38 PM

## 2018-11-28 MED ORDER — LORAZEPAM 2 MG/ML IJ SOLN
0.5000 mg | Freq: Once | INTRAMUSCULAR | Status: AC
  Administered 2018-11-28: 0.5 mg via INTRAVENOUS
  Filled 2018-11-28: qty 1

## 2018-11-28 NOTE — Progress Notes (Signed)
Trach sutures removed per CCM order.  Pt tolerated procedure well with no distress.  RN @ bedside.

## 2018-11-28 NOTE — Progress Notes (Signed)
Patient is becoming very agitated at this time requesting ice chips however ordered NPO at this time. Patients is now refusing osmolite bolus feeds and states that the physician previously stated he could have ice chips however no note was documented. Explained to patient the need for nutrition and supplementation, however patient is adamant about the ice chips. This RN will page hospitalist and inquire about ice chips. Patient agrees to receiving the evening bolus however still refuses this afternoon bolus.

## 2018-11-28 NOTE — Progress Notes (Signed)
Was giving patient his 15 mg of oxycodone when the pill crushing broke from the bottom and the pills fell on the floor. This was witnessed by the patient and his brother terry. Pulled another 15 mg of oxycodone and notified charge RN teresa of incident.

## 2018-11-28 NOTE — Progress Notes (Signed)
PROGRESS NOTE    Tony Berry   [id number]  DOB: [birth year]  DOA: 11/16/2018 PCP: Cher Nakai, MD   Brief Narrative:  Tony Berry is a 58 year old male with a history of head and neck cancer who is status post radiation therapy and has issues with recurrent aspiration pneumonia,  PEG tube with severe protein calorie malnutrition, on chronic opioids for pain. The patient was found unresponsive by family with agonal breathing.  He was intubated at the [facility] ED and transferred to [facility].  In the ICU, he was treated for pneumonia and septic shock. He was extubated on 8/27 but had to be reintubated the same day due to severe hypoxia.  He underwent tracheostomy on 8/30.  Subjective: Trach sutures removed today.  He has no complaints.  Assessment & Plan:   Principal Problem:   Acute on chronic respiratory failure with hypoxia /  Tracheostomy tube present  ?  History of COPD -The patient has problems with chronic aspiration and apparently has been intubated multiple times -He currently has a cuffed #6 Shiley tracheostomy and is working with a Industrial/product designer -Per pulmonary critical care, tracheostomy sutures can be removed on 9/6 -He is DNR as of 8/31 - he is receiving trach teaching   Active Problems:    S/P percutaneous endoscopic gastrostomy (PEG) tube placement /dysphagia   Protein-calorie malnutrition, severe -Continue tube feeds    Head and neck cancer  -Status post radiation -Is receiving oxycodone 10 mg Q4hrs and  15 mg Q4 PRN for pain-also receiving Neurontin  Anxiety The patient is receiving Klonopin daily and Seroquel nightly   Time spent in minutes: 35 DVT prophylaxis: Lovenox Code Status: DO NOT RESUSCITATE Family Communication:  Disposition Plan:  will go home on Monday or Tuesday seen by pulmonary i/critical care Consultants:   Pulmonary critical care  Palliative care Procedures:   Intubation/extubation  Tracheostomy Antimicrobials:   Anti-infectives (From admission, onward)   Start     Dose/Rate Route Frequency Ordered Stop   11/19/18 2000  vancomycin (VANCOCIN) IVPB 750 mg/150 ml premix  Status:  Discontinued     750 mg 150 mL/hr over 60 Minutes Intravenous Every 12 hours 11/19/18 0814 11/19/18 1550   11/19/18 0815  vancomycin (VANCOCIN) IVPB 1000 mg/200 mL premix     1,000 mg 200 mL/hr over 60 Minutes Intravenous  Once 11/19/18 0813 11/19/18 1024   11/17/18 0945  cefTRIAXone (ROCEPHIN) 2 g in sodium chloride 0.9 % 100 mL IVPB     2 g 200 mL/hr over 30 Minutes Intravenous Every 24 hours 11/17/18 0934 11/21/18 0904       Objective: Vitals:   11/28/18 0900 11/28/18 1000 11/28/18 1100 11/28/18 1200  BP: 98/63 94/64 106/66 92/60  Pulse: 74 70 75 76  Resp: 14 11 15 15   Temp:    97.9 F (36.6 C)  TempSrc:    Oral  SpO2: 96% 96% 96% 96%  Weight:      Height:        Intake/Output Summary (Last 24 hours) at 11/28/2018 1356 Last data filed at 11/28/2018 1200 Gross per 24 hour  Intake 1132 ml  Output 800 ml  Net 332 ml   Filed Weights   11/26/18 0438 11/27/18 0500 11/28/18 0316  Weight: 46 kg 45 kg 43 kg    Examination: General exam: Appears comfortable  HEENT: PERRLA, oral mucosa moist, no sclera icterus or thrush-trach in place Respiratory system: Clear to auscultation. Respiratory effort normal. Cardiovascular system: S1 &  S2 heard,  No murmurs  Gastrointestinal system: Abdomen soft, non-tender, nondistended. Normal bowel sounds-PEG tube in place Central nervous system: Alert and oriented. No focal neurological deficits. Extremities: No cyanosis, clubbing or edema Skin: No rashes or ulcers Psychiatry:  Mood & affect appropriate.   Data Reviewed: I have personally reviewed following labs and imaging studies  CBC: Recent Labs  Lab 11/27/18 0453  WBC 8.6  HGB 9.2*  HCT 30.0*  MCV 88.2  PLT [id number]   Basic Metabolic Panel: Recent Labs  Lab 11/23/18 0346 11/27/18 0453  NA 135 136  K 3.9 3.9  CL  97* 99  CO2 30 30  GLUCOSE 102* 122*  BUN 24* 15  CREATININE 0.32* 0.31*  CALCIUM 8.4* 8.6*   GFR: Estimated Creatinine Clearance: 62 mL/min (A) (by C-G formula based on SCr of 0.31 mg/dL (L)). Liver Function Tests: No results for input(s): AST, ALT, ALKPHOS, BILITOT, PROT, ALBUMIN in the last 168 hours. No results for input(s): LIPASE, AMYLASE in the last 168 hours. No results for input(s): AMMONIA in the last 168 hours. Coagulation Profile: No results for input(s): INR, PROTIME in the last 168 hours. Cardiac Enzymes: No results for input(s): CKTOTAL, CKMB, CKMBINDEX, TROPONINI in the last 168 hours. BNP (last 3 results) No results for input(s): PROBNP in the last 8760 hours. HbA1C: No results for input(s): HGBA1C in the last 72 hours. CBG: Recent Labs  Lab 11/26/18 0758 11/26/18 1158 11/26/18 1548 11/27/18 0834 11/27/18 1222  GLUCAP 95 120* 116* 129* 139*   Lipid Profile: No results for input(s): CHOL, HDL, LDLCALC, TRIG, CHOLHDL, LDLDIRECT in the last 72 hours. Thyroid Function Tests: No results for input(s): TSH, T4TOTAL, FREET4, T3FREE, THYROIDAB in the last 72 hours. Anemia Panel: No results for input(s): VITAMINB12, FOLATE, FERRITIN, TIBC, IRON, RETICCTPCT in the last 72 hours. Urine analysis:    Component Value Date/Time   COLORURINE YELLOW 11/17/2018 0100   APPEARANCEUR CLOUDY (A) 11/17/2018 0100   LABSPEC 1.011 11/17/2018 0100   PHURINE 8.0 11/17/2018 0100   GLUCOSEU NEGATIVE 11/17/2018 0100   HGBUR NEGATIVE 11/17/2018 0100   BILIRUBINUR NEGATIVE 11/17/2018 0100   KETONESUR NEGATIVE 11/17/2018 0100   PROTEINUR NEGATIVE 11/17/2018 0100   UROBILINOGEN 0.2 05/20/2007 0952   NITRITE NEGATIVE 11/17/2018 0100   LEUKOCYTESUR TRACE (A) 11/17/2018 0100   Sepsis Labs: @LABRCNTIP (procalcitonin:4,lacticidven:4) ) No results found for this or any previous visit (from the past 240 hour(s)).       Radiology Studies: No results found.    Scheduled Meds: .  acetaminophen (TYLENOL) oral liquid 160 mg/5 mL  650 mg Per Tube Q6H  . aspirin  81 mg Per Tube Daily  . chlorhexidine  15 mL Mouth Rinse BID  . Chlorhexidine Gluconate Cloth  6 each Topical Daily  . enoxaparin (LOVENOX) injection  30 mg Subcutaneous Q24H  . feeding supplement (OSMOLITE 1.5 CAL)  356 mL Per Tube QID  . free water  120 mL Per Tube QID  . gabapentin  300 mg Per Tube Q8H  . mouth rinse  15 mL Mouth Rinse q12n4p  . oxyCODONE  15 mg Per Tube Q4H  . pantoprazole sodium  40 mg Per Tube QHS  . QUEtiapine  50 mg Oral QHS  . senna-docusate  1 tablet Per Tube Daily   Continuous Infusions: . sodium chloride Stopped (11/20/18 0853)  . sodium chloride Stopped (11/22/18 1900)     LOS: 12 days      Debbe Odea, MD [facility] Pager: [email or web address] Password [email or web address]  11/28/2018, 1:56 PM

## 2018-11-29 ENCOUNTER — Encounter (HOSPITAL_COMMUNITY): Payer: Self-pay

## 2018-11-29 LAB — GLUCOSE, CAPILLARY
Glucose-Capillary: 59 mg/dL — ABNORMAL LOW (ref 70–99)
Glucose-Capillary: 120 mg/dL — ABNORMAL HIGH (ref 70–99)

## 2018-11-29 NOTE — Progress Notes (Signed)
Physical Therapy Treatment Patient Details Name: Tony Berry MRN: [id number] DOB: Jun 22, 1960 Today's Date: 11/29/2018    History of Present Illness 58 year old male with PMH head and neck cancer s/p radiation therapy, COPD, and recurrent aspiration. He has PEG in place. He has recent admissions to [facility] and [facility] for recurrent aspiration most recently 8/4 when he required intubation.8/25 he became unresponsive at home and his wife called EMS. Found to haveAcute on chronic hypoxemic/hypercabric respiratory failure likely secondary to chronic aspiration, high dose Methadone and underlying emphysema andAcute encephalopathy secondary to acute on chronic respiratory failure. Intubated 8/25, extubated 8/27 and reintubated same day. Trached 8/30 and now on trach collar    PT Comments    Pt is progressing well with increased gait distance today. His trach sounds productive with expectorated sputum.  His lowest accurate O2 sat during gait was 87% and HR 109 on RA.  28% TC 5 L returned to pt at end of session. He is very flat, repeatedly asking to speak with MD and wants to go home.  PT will continue to follow acutely for safe mobility progression  Follow Up Recommendations  SNF     Equipment Recommendations  None recommended by PT    Recommendations for Other Services   NA     Precautions / Restrictions Precautions Precautions: Fall Precaution Comments: PEG tube baseline; new tracheostomy    Mobility  Bed Mobility Overal bed mobility: Modified Independent                Transfers Overall transfer level: Needs assistance Equipment used: 4-wheeled walker Transfers: Sit to/from Stand Sit to Stand: Min guard         General transfer comment: Min guard assist for safety and balance.   Ambulation/Gait Ambulation/Gait assistance: Min guard Gait Distance (Feet): 150 Feet Assistive device: 4-wheeled walker Gait Pattern/deviations: Step-through pattern;Trunk  flexed;Staggering right;Staggering left   Gait velocity interpretation: 1.31 - 2.62 ft/sec, indicative of limited community ambulator General Gait Details: Pt with mildly staggering gait pattern, good speed, O2 sats lowest accurate sat was 87% on RA during gait.  HR 109.           Balance Overall balance assessment: Needs assistance Sitting-balance support: Feet supported;No upper extremity supported Sitting balance-Leahy Scale: Good     Standing balance support: No upper extremity supported;Bilateral upper extremity supported;Single extremity supported Standing balance-Leahy Scale: Fair Standing balance comment: close supervision in static standing, anything dynamic requires support                            Cognition Arousal/Alertness: Awake/alert Behavior During Therapy: Flat affect Overall Cognitive Status: Impaired/Different from baseline Area of Impairment: Safety/judgement                         Safety/Judgement: Decreased awareness of safety     General Comments: Pt wanting to go home, per RN notes, she asked if he could do his own suctioning, he could not, she asked who was going to suction him if he was home alone?  Pt continues to want to go home.  He requested to speak to the MD, so I messaged and paged Dr. Wynelle Cleveland             Pertinent Vitals/Pain Pain Assessment: No/denies pain       Prior Function            PT Goals (  current goals can now be found in the care plan section) Acute Rehab PT Goals Patient Stated Goal: to go home Progress towards PT goals: Progressing toward goals    Frequency    Min 3X/week      PT Plan Current plan remains appropriate;Frequency needs to be updated       AM-PAC PT "6 Clicks" Mobility   Outcome Measure  Help needed turning from your back to your side while in a flat bed without using bedrails?: None Help needed moving from lying on your back to sitting on the side of a flat bed without  using bedrails?: None Help needed moving to and from a bed to a chair (including a wheelchair)?: None Help needed standing up from a chair using your arms (e.g., wheelchair or bedside chair)?: None Help needed to walk in hospital room?: A Little Help needed climbing 3-5 steps with a railing? : A Little 6 Click Score: 22    End of Session Equipment Utilized During Treatment: Gait belt Activity Tolerance: Patient limited by fatigue Patient left: in bed;with call bell/phone within reach Nurse Communication: Mobility status;Other (comment)(request to speak to MD) PT Visit Diagnosis: Other abnormalities of gait and mobility (R26.89);Muscle weakness (generalized) (M62.81);Adult, failure to thrive (R62.7)     Time: [id number] PT Time Calculation (min) (ACUTE ONLY): 23 min  Charges:  $Gait Training: 23-37 mins                    Danisa Kopec B. Boston Cookson, PT, DPT  Acute Rehabilitation [phone] pager [phone] office  @ [facility]: [phone]   11/29/2018, 1:47 PM

## 2018-11-29 NOTE — Progress Notes (Signed)
PROGRESS NOTE    Tony Berry   [id number]  DOB: Aug 07, 1960  DOA: 11/16/2018 PCP: Cher Nakai, MD   Brief Narrative:  Tony Berry is a 58 year old male with a history of head and neck cancer who is status post radiation therapy and has issues with recurrent aspiration pneumonia,  PEG tube with severe protein calorie malnutrition, on chronic opioids for pain. The patient was found unresponsive by family with agonal breathing.  He was intubated at the [facility] ED and transferred to [facility]  In the ICU, he was treated for pneumonia and septic shock. He was extubated on 8/27 but had to be reintubated the same day due to severe hypoxia.  He underwent tracheostomy on 8/30.  Subjective: Other than wanting to go home, he has no complaints.  Assessment & Plan:   Principal Problem:   Acute on chronic respiratory failure with hypoxia /  Tracheostomy tube present  ?  History of COPD -The patient has problems with chronic aspiration and apparently has been intubated multiple times-apparently continues to eat and therefore continues to aspirate -He currently has a cuffed #6 Shiley tracheostomy and is working with a Industrial/product designer -Per pulmonary critical care, tracheostomy sutures removed on 9/6 -He is DNR as of 8/31 - he is receiving trach teaching-I have been told by case management that none of his supplies can be delivered until tomorrow as it is a holiday today -Per pulmonary he is allowed to have ice chips for now-pulmonary states that he is not safe for discharge yet they will evaluate him and okay him for discharge  Active Problems:    S/P percutaneous endoscopic gastrostomy (PEG) tube placement /dysphagia   Protein-calorie malnutrition, severe -Continue tube feeds-he is recommended to stay n.p.o. as he continues to aspirate   Head and neck cancer  -Status post radiation -Is receiving oxycodone 10 mg Q4hrs and  15 mg Q4 PRN for pain-also receiving Neurontin  Anxiety  The patient is receiving Klonopin daily and Seroquel nightly   Time spent in minutes: 35 DVT prophylaxis: Lovenox Code Status: DO NOT RESUSCITATE Family Communication:  Disposition Plan: Likely home tomorrow Consultants:   Pulmonary critical care  Palliative care Procedures:   Intubation/extubation  Tracheostomy Antimicrobials:  Anti-infectives (From admission, onward)   Start     Dose/Rate Route Frequency Ordered Stop   11/19/18 2000  vancomycin (VANCOCIN) IVPB 750 mg/150 ml premix  Status:  Discontinued     750 mg 150 mL/hr over 60 Minutes Intravenous Every 12 hours 11/19/18 0814 11/19/18 1550   11/19/18 0815  vancomycin (VANCOCIN) IVPB 1000 mg/200 mL premix     1,000 mg 200 mL/hr over 60 Minutes Intravenous  Once 11/19/18 0813 11/19/18 1024   11/17/18 0945  cefTRIAXone (ROCEPHIN) 2 g in sodium chloride 0.9 % 100 mL IVPB     2 g 200 mL/hr over 30 Minutes Intravenous Every 24 hours 11/17/18 0934 11/21/18 0904       Objective: Vitals:   11/29/18 0831 11/29/18 1100 11/29/18 1108 11/29/18 1341  BP: 95/64 99/62 99/62    Pulse: 83  100   Resp: 19  (!) 22   Temp:  98.5 F (36.9 C)    TempSrc:  Oral    SpO2: 96%  92% (!) 87%  Weight:      Height:       No intake or output data in the 24 hours ending 11/29/18 1353 Filed Weights   11/26/18 0438 11/27/18 0500 11/28/18 0316  Weight: 46  kg 45 kg 43 kg    Examination: General exam: Appears comfortable  HEENT: PERRLA, oral mucosa moist, no sclera icterus or thrush-trach in place Respiratory system: Clear to auscultation. Respiratory effort normal. Cardiovascular system: S1 & S2 heard,  No murmurs  Gastrointestinal system: Abdomen soft, non-tender, nondistended. Normal bowel sounds-PEG tube in place Central nervous system: Alert and oriented. No focal neurological deficits. Extremities: No cyanosis, clubbing or edema Skin: No rashes or ulcers Psychiatry:  Mood & affect appropriate.   Data Reviewed: I have personally  reviewed following labs and imaging studies  CBC: Recent Labs  Lab 11/27/18 0453  WBC 8.6  HGB 9.2*  HCT 30.0*  MCV 88.2  PLT [id number]   Basic Metabolic Panel: Recent Labs  Lab 11/23/18 0346 11/27/18 0453  NA 135 136  K 3.9 3.9  CL 97* 99  CO2 30 30  GLUCOSE 102* 122*  BUN 24* 15  CREATININE 0.32* 0.31*  CALCIUM 8.4* 8.6*   GFR: Estimated Creatinine Clearance: 62 mL/min (A) (by C-G formula based on SCr of 0.31 mg/dL (L)). Liver Function Tests: No results for input(s): AST, ALT, ALKPHOS, BILITOT, PROT, ALBUMIN in the last 168 hours. No results for input(s): LIPASE, AMYLASE in the last 168 hours. No results for input(s): AMMONIA in the last 168 hours. Coagulation Profile: No results for input(s): INR, PROTIME in the last 168 hours. Cardiac Enzymes: No results for input(s): CKTOTAL, CKMB, CKMBINDEX, TROPONINI in the last 168 hours. BNP (last 3 results) No results for input(s): PROBNP in the last 8760 hours. HbA1C: No results for input(s): HGBA1C in the last 72 hours. CBG: Recent Labs  Lab 11/26/18 0758 11/26/18 1158 11/26/18 1548 11/27/18 0834 11/27/18 1222  GLUCAP 95 120* 116* 129* 139*   Lipid Profile: No results for input(s): CHOL, HDL, LDLCALC, TRIG, CHOLHDL, LDLDIRECT in the last 72 hours. Thyroid Function Tests: No results for input(s): TSH, T4TOTAL, FREET4, T3FREE, THYROIDAB in the last 72 hours. Anemia Panel: No results for input(s): VITAMINB12, FOLATE, FERRITIN, TIBC, IRON, RETICCTPCT in the last 72 hours. Urine analysis:    Component Value Date/Time   COLORURINE YELLOW 11/17/2018 0100   APPEARANCEUR CLOUDY (A) 11/17/2018 0100   LABSPEC 1.011 11/17/2018 0100   PHURINE 8.0 11/17/2018 0100   GLUCOSEU NEGATIVE 11/17/2018 0100   HGBUR NEGATIVE 11/17/2018 0100   BILIRUBINUR NEGATIVE 11/17/2018 0100   KETONESUR NEGATIVE 11/17/2018 0100   PROTEINUR NEGATIVE 11/17/2018 0100   UROBILINOGEN 0.2 05/20/2007 0952   NITRITE NEGATIVE 11/17/2018 0100    LEUKOCYTESUR TRACE (A) 11/17/2018 0100   Sepsis Labs: @LABRCNTIP (procalcitonin:4,lacticidven:4) ) No results found for this or any previous visit (from the past 240 hour(s)).       Radiology Studies: No results found.    Scheduled Meds: . acetaminophen (TYLENOL) oral liquid 160 mg/5 mL  650 mg Per Tube Q6H  . aspirin  81 mg Per Tube Daily  . chlorhexidine  15 mL Mouth Rinse BID  . enoxaparin (LOVENOX) injection  30 mg Subcutaneous Q24H  . feeding supplement (OSMOLITE 1.5 CAL)  356 mL Per Tube QID  . free water  120 mL Per Tube QID  . gabapentin  300 mg Per Tube Q8H  . mouth rinse  15 mL Mouth Rinse q12n4p  . oxyCODONE  15 mg Per Tube Q4H  . pantoprazole sodium  40 mg Per Tube QHS  . QUEtiapine  50 mg Oral QHS  . senna-docusate  1 tablet Per Tube Daily   Continuous Infusions: . sodium chloride Stopped (11/20/18 0853)  .  sodium chloride Stopped (11/22/18 1900)     LOS: 13 days      Debbe Odea, MD [facility] Pager: [email or web address] Password [email or web address] 11/29/2018, 1:53 PM

## 2018-11-29 NOTE — Progress Notes (Signed)
Pt called nurse into room and stated he was going home. This nurse asked pt when, pt replied today. Asked pt how he was getting home, pt stated he would get a ride. Communication sent to MD to notify of pt statement. During this time nurse was called back to room by pt. Nurse asked pt what they needed. Pt stated suctioning. Pt asked who will suction pt when they get home. Pt stated I will. Nurse asked pt to show how they will suction themselves. Pt unable to get suctioning to tracheal airway for suctioning of secretions. Pt put suction underneath chin area. Pt was then informed suction is now contaminated and has to be thrown away. Pt disconnected suction and handed it to nurse to be thrown away. Nurse than asked pt ao who will suction you went you get home. Pt face irritable with no verbal response. Pt then asked will they still try to go home today? Pt responded no. MD updated to make aware.

## 2018-11-29 NOTE — Progress Notes (Signed)
Pt requested ice cream down his tube. Informed pt this nurse will not be putting ice cream down his tube nor recommend that he does. Pt asked what other things pt put through his tube. Pt states food. Asked pt if he put things like hamburger meat down his tube. Pt shook head yes and stated it's food, it's going to the stomach. Pt advised about the potential clotting of tube and complications.Pt body language showed understanding, but not agreeance.

## 2018-11-30 ENCOUNTER — Other Ambulatory Visit: Payer: Self-pay

## 2018-11-30 MED ORDER — LORAZEPAM 2 MG/ML IJ SOLN
0.5000 mg | Freq: Once | INTRAMUSCULAR | Status: AC
  Administered 2018-11-30: 0.5 mg via INTRAVENOUS
  Filled 2018-11-30: qty 1

## 2018-11-30 NOTE — Progress Notes (Signed)
   NAME:  Tony Berry, MRN:  [id number], DOB:  1960-12-04, LOS: 69 ADMISSION DATE:  11/16/2018, CONSULTATION DATE:  8/25 REFERRING MD:  [facility], CHIEF COMPLAINT:  Dypsea   Brief History   58 year old male with head and neck cancer s/p radiation therapy complicated by dysphagia and recurrent aspiration. [name]25 from [facility] ED where he required intubation in the setting of AMS and hypoxemic respiratory failure. Extubated 8/27 and shortly thereafter required re-intubation.    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  Significant Hospital Events   8/25 admitted 8/27, extubated then re-intubated  Consults:  8/26 Palliative Care  Procedures:  ETT multiple times Trach 8/30  Significant Diagnostic Tests:    Micro Data:  COVID [facility]) 8/25 >> NEG MRSA screen 8/25>>neg Respiratory Culture 8/26>>serratia  Antimicrobials:  Ceftriaxone 8/26 x 7 days  Interim history/subjective:  No distress, wants to go home. FiO2 increased to 35% last night for unclear reasons.   Objective   Blood pressure (!) 88/66, pulse 78, temperature 98 F (36.7 C), temperature source Oral, resp. rate 16, height 5\' 6"  (1.676 m), weight 45.8 kg, SpO2 100 %.    FiO2 (%):  [28 %-35 %] 28 %   Intake/Output Summary (Last 24 hours) at 11/30/2018 0825 Last data filed at 11/29/2018 1957 Gross per 24 hour  Intake -  Output 500 ml  Net -500 ml   Filed Weights   11/27/18 0500 11/28/18 0316 11/30/18 0304  Weight: 45 kg 43 kg 45.8 kg   General:  Middle aged male in [place] Neuro:  Alert, oriented, non-focal HEENT:  [place]/AT, No JVD noted, PERRL Cardiovascular:  RRR, no MRG Lungs:  Coarse bases. Strong cough. Thick clear secretions.  Abdomen:  Soft, non-distended Musculoskeletal:  No acute deformity Skin:  Intact, MMM  Resolved Hospital Problem list   Encephalopathy Shock  Assessment & Plan:   Acute  on chronic hypoxemic/hypercabric respiratory failure likely secondary to acute serratia pneumonia on top of chronic aspiration syndrome-in pt w/ head and neck cancer. Protein calorie malnutrition Probable COPD Chronic pain  Discussion -Completed antibiotics -Looks comfortable on tracheostomy size 6 -Tracheostomy placed on 8/30, sutures out over the weekend. Based on his progression he is ready for trach change to 4 cuffless. The only caveat is that he required increase of FiO2 to 35% last night. It is not clear to my why this was necessary, it may not have been. He has now been turned back to 28%, will re-eval in a couple hours and if tolerating well will plan to change trach.   Plan Continue routine tracheostomy care Plan to change trach today to 4 cuffless assuming he tolerated FiO2 return to 28%.  NPO continue. Would prefer SLP eval after trach change.  Continue tube feeds Continue bronchodilators as needed If trach change goes well today, would anticipate for discharge tomorrow.  Will need outpatient trach clinic follow up.    Georgann Housekeeper, AGACNP-BC [place] Pager [phone] or [phone]  11/30/2018 8:32 AM

## 2018-11-30 NOTE — Progress Notes (Signed)
PROGRESS NOTE    Tony Berry   [id number]  DOB: May 30, 1960  DOA: 11/16/2018 PCP: Cher Nakai, MD   Brief Narrative:  Tony Berry is a 58 year old male with a history of head and neck cancer who is status post radiation therapy and has issues with recurrent aspiration pneumonia,  PEG tube with severe protein calorie malnutrition, on chronic opioids for pain. The patient was found unresponsive by family with agonal breathing.  He was intubated at the [facility] ED and transferred to [facility].  In the ICU, he was treated for pneumonia and septic shock. He was extubated on 8/27 but had to be reintubated the same day due to severe hypoxia.  He underwent tracheostomy on 8/30.  His trach was downgraded today.  According to case management, because this is a new trach, he needs 2 people to learn how to take care of it.  The patient has not been receptive to teaching in the hospital and his wife states that she is not home most of the time because she needs to work.  Case management is trying to see if some home care can be arranged otherwise he will need to go to SNF.  Of note, the patient has been demanding to leave and at times threatening to leave AMA.  Subjective: He has no complaints for me today.  Asking when his trach is going to be downgraded  Assessment & Plan:   Principal Problem:   Acute on chronic respiratory failure with hypoxia /  Tracheostomy tube present  ?  History of COPD -The patient has problems with chronic aspiration and apparently has been intubated multiple times-apparently continues to eat and therefore continues to aspirate -He currently has a cuffed #6 Shiley tracheostomy and is working with a Industrial/product designer -Per pulmonary critical care, tracheostomy sutures removed on 9/6 -He is DNR as of 8/31 - he is receiving trach teaching-I have been told by case management that none of his supplies can be delivered until tomorrow as it is a holiday today -Per pulmonary  he is allowed to have ice chips for now-pulmonary states that he is not safe for discharge yet-trach has been downgraded today  Active Problems:    S/P percutaneous endoscopic gastrostomy (PEG) tube placement /dysphagia   Protein-calorie malnutrition, severe -Continue tube feeds-he is recommended to stay n.p.o. as he continues to aspirate -Of note, the patient has been noncompliant with being n.p.o. at home   Head and neck cancer  -Status post radiation -Is receiving oxycodone 10 mg Q4hrs and  15 mg Q4 PRN for pain-also receiving Neurontin  Anxiety The patient is receiving Klonopin daily and Seroquel nightly   Time spent in minutes: 35 DVT prophylaxis: Lovenox Code Status: DO NOT RESUSCITATE Family Communication:  Disposition Plan: Will go home either when home care is arranged or when the SNF is arranged Consultants:   Pulmonary critical care  Palliative care Procedures:   Intubation/extubation  Tracheostomy Antimicrobials:  Anti-infectives (From admission, onward)   Start     Dose/Rate Route Frequency Ordered Stop   11/19/18 2000  vancomycin (VANCOCIN) IVPB 750 mg/150 ml premix  Status:  Discontinued     750 mg 150 mL/hr over 60 Minutes Intravenous Every 12 hours 11/19/18 0814 11/19/18 1550   11/19/18 0815  vancomycin (VANCOCIN) IVPB 1000 mg/200 mL premix     1,000 mg 200 mL/hr over 60 Minutes Intravenous  Once 11/19/18 0813 11/19/18 1024   11/17/18 0945  cefTRIAXone (ROCEPHIN) 2 g in  sodium chloride 0.9 % 100 mL IVPB     2 g 200 mL/hr over 30 Minutes Intravenous Every 24 hours 11/17/18 0934 11/21/18 0904       Objective: Vitals:   11/30/18 0820 11/30/18 1100 11/30/18 1134 11/30/18 1225  BP:  91/60 91/60   Pulse: 78 91 91 77  Resp: 16 20 18 17   Temp:  97.9 F (36.6 C)    TempSrc:  Oral    SpO2: 100% 97% 98% 98%  Weight:      Height:        Intake/Output Summary (Last 24 hours) at 11/30/2018 1321 Last data filed at 11/30/2018 1000 Gross per 24 hour   Intake 60 ml  Output 500 ml  Net -440 ml   Filed Weights   11/27/18 0500 11/28/18 0316 11/30/18 0304  Weight: 45 kg 43 kg 45.8 kg    Examination: General exam: Appears comfortable  HEENT: PERRLA, oral mucosa moist, no sclera icterus or thrush-trach in place Respiratory system: Clear to auscultation. Respiratory effort normal. Cardiovascular system: S1 & S2 heard,  No murmurs  Gastrointestinal system: Abdomen soft, non-tender, nondistended. Normal bowel sounds-PEG tube in place Central nervous system: Alert and oriented. No focal neurological deficits. Extremities: No cyanosis, clubbing or edema Skin: No rashes or ulcers Psychiatry:  Mood & affect appropriate.    Data Reviewed: I have personally reviewed following labs and imaging studies  CBC: Recent Labs  Lab 11/27/18 0453  WBC 8.6  HGB 9.2*  HCT 30.0*  MCV 88.2  PLT [id number]   Basic Metabolic Panel: Recent Labs  Lab 11/27/18 0453  NA 136  K 3.9  CL 99  CO2 30  GLUCOSE 122*  BUN 15  CREATININE 0.31*  CALCIUM 8.6*   GFR: Estimated Creatinine Clearance: 66 mL/min (A) (by C-G formula based on SCr of 0.31 mg/dL (L)). Liver Function Tests: No results for input(s): AST, ALT, ALKPHOS, BILITOT, PROT, ALBUMIN in the last 168 hours. No results for input(s): LIPASE, AMYLASE in the last 168 hours. No results for input(s): AMMONIA in the last 168 hours. Coagulation Profile: No results for input(s): INR, PROTIME in the last 168 hours. Cardiac Enzymes: No results for input(s): CKTOTAL, CKMB, CKMBINDEX, TROPONINI in the last 168 hours. BNP (last 3 results) No results for input(s): PROBNP in the last 8760 hours. HbA1C: No results for input(s): HGBA1C in the last 72 hours. CBG: Recent Labs  Lab 11/26/18 1548 11/27/18 0834 11/27/18 1222 11/29/18 1728 11/29/18 1800  GLUCAP 116* 129* 139* 59* 120*   Lipid Profile: No results for input(s): CHOL, HDL, LDLCALC, TRIG, CHOLHDL, LDLDIRECT in the last 72 hours. Thyroid  Function Tests: No results for input(s): TSH, T4TOTAL, FREET4, T3FREE, THYROIDAB in the last 72 hours. Anemia Panel: No results for input(s): VITAMINB12, FOLATE, FERRITIN, TIBC, IRON, RETICCTPCT in the last 72 hours. Urine analysis:    Component Value Date/Time   COLORURINE YELLOW 11/17/2018 0100   APPEARANCEUR CLOUDY (A) 11/17/2018 0100   LABSPEC 1.011 11/17/2018 0100   PHURINE 8.0 11/17/2018 0100   GLUCOSEU NEGATIVE 11/17/2018 0100   HGBUR NEGATIVE 11/17/2018 0100   BILIRUBINUR NEGATIVE 11/17/2018 0100   KETONESUR NEGATIVE 11/17/2018 0100   PROTEINUR NEGATIVE 11/17/2018 0100   UROBILINOGEN 0.2 05/20/2007 0952   NITRITE NEGATIVE 11/17/2018 0100   LEUKOCYTESUR TRACE (A) 11/17/2018 0100   Sepsis Labs: @LABRCNTIP (procalcitonin:4,lacticidven:4) ) No results found for this or any previous visit (from the past 240 hour(s)).       Radiology Studies: No results found.  Scheduled Meds: . acetaminophen (TYLENOL) oral liquid 160 mg/5 mL  650 mg Per Tube Q6H  . aspirin  81 mg Per Tube Daily  . chlorhexidine  15 mL Mouth Rinse BID  . enoxaparin (LOVENOX) injection  30 mg Subcutaneous Q24H  . feeding supplement (OSMOLITE 1.5 CAL)  356 mL Per Tube QID  . free water  120 mL Per Tube QID  . gabapentin  300 mg Per Tube Q8H  . mouth rinse  15 mL Mouth Rinse q12n4p  . oxyCODONE  15 mg Per Tube Q4H  . pantoprazole sodium  40 mg Per Tube QHS  . QUEtiapine  50 mg Oral QHS  . senna-docusate  1 tablet Per Tube Daily   Continuous Infusions: . sodium chloride Stopped (11/20/18 0853)  . sodium chloride Stopped (11/22/18 1900)     LOS: 14 days      Debbe Odea, MD [facility] Pager: [email or web address] Password [email or web address] 11/30/2018, 1:21 PM

## 2018-11-30 NOTE — TOC Progression Note (Signed)
Transition of Care [facility]) - Progression Note    Patient Details  Name: Tony Berry MRN: [id number] Date of Birth: 13-Nov-1960  Transition of [facility] CM/SW Contact  Pollie Friar, RN Phone Number: 11/30/2018, 2:23 PM  Clinical Narrative:    Wife feels the best option currently is for patient to d/c to rehab prior to home but the patient is adamant that he is going home and is refusing rehab. Wife is willing to try and get him home but she is gone during most days at least 8 hours to stay with their grandson. [facility] trach team is going to look at the patient and speak with the family and see if he qualifies for private duty RN through his medicaid.  Wife will need to be trach trained prior to d/c. She is going to try and get a ride up here on Sat. She will let CM know about timing.  Will need DME for home: rollator, suction, oxygen supplies for trach, and trach supplies.  ToC following.  Expected Discharge Plan: Long Term Nursing Home Barriers to Discharge: Continued Medical Work up  Expected Discharge Plan and Services Expected Discharge Plan: [place]   Discharge Planning Services: CM Consult   Living arrangements for the past 2 months: Apartment                                       Social Determinants of Health (SDOH) Interventions    Readmission Risk Interventions Readmission Risk Prevention Plan 10/01/2018  Transportation Screening Complete  PCP or Specialist Appt within 3-5 Days Complete  HRI or [place] Complete  Social Work Consult for [place] Planning/Counseling Complete  Palliative Care Screening Complete  Medication Review Press photographer) Complete  Some recent data might be hidden

## 2018-11-30 NOTE — Progress Notes (Signed)
Occupational Therapy Treatment Patient Details Name: Tony Berry MRN: [id number] DOB: [birth year] Today's Date: 11/30/2018    History of present illness 58 year old male with PMH head and neck cancer s/p radiation therapy, COPD, and recurrent aspiration. He has PEG in place. He has recent admissions to [facility] and [facility] for recurrent aspiration most recently 8/4 when he required intubation.8/25 he became unresponsive at home and his wife called EMS. Found to haveAcute on chronic hypoxemic/hypercabric respiratory failure likely secondary to chronic aspiration, high dose Methadone and underlying emphysema andAcute encephalopathy secondary to acute on chronic respiratory failure. Intubated 8/25, extubated 8/27 and reintubated same day. Trached 8/30 and now on trach collar   OT comments  Pt progressing well. Pt tolerating standing at sink for 2 mins with SOB, but O2 staying >90% on 6L O2 through trach. Pt agreeable to therapy. Ambulating in room and hallway with minguardA for IV lines~125 feet. Pt agitated that he is unable to go home yet. Pt doing very well with ADL and mobility. Pt using means of pressing on trach for talking. Pt requires post acute rehab for energy conservation and challenging ADL/mobility.   Follow Up Recommendations  SNF;Home health OT;Supervision/Assistance - 24 hour(with ability to meet necessary medical needs)    Equipment Recommendations  3 in 1 bedside commode    Recommendations for Other Services      Precautions / Restrictions Precautions Precautions: Fall Precaution Comments: PEG tube baseline; new tracheostomy Restrictions Weight Bearing Restrictions: No       Mobility Bed Mobility Overal bed mobility: Modified Independent                Transfers Overall transfer level: Needs assistance Equipment used: 4-wheeled walker Transfers: Sit to/from Stand Sit to Stand: Min guard Stand pivot transfers: Min guard       General transfer  comment: Min guard assist for safety and balance.     Balance Overall balance assessment: Needs assistance Sitting-balance support: Feet supported;No upper extremity supported Sitting balance-Leahy Scale: Good     Standing balance support: No upper extremity supported;During functional activity Standing balance-Leahy Scale: Fair Standing balance comment: close supervisionA for IV lines and O2 tubing                           ADL either performed or assessed with clinical judgement   ADL Overall ADL's : Needs assistance/impaired Eating/Feeding: NPO Eating/Feeding Details (indicate cue type and reason): PEG at baseline; NPO currently                                 Functional mobility during ADLs: Min guard(mostly for IV/line management and O2) General ADL Comments: pt tolerating standing at sink for 2 mins with SOB, but O2 staying >90% on 6L O2 through trach     Vision Baseline Vision/History: No visual deficits Vision Assessment?: No apparent visual deficits   Perception     Praxis      Cognition Arousal/Alertness: Awake/alert Behavior During Therapy: Flat affect Overall Cognitive Status: Impaired/Different from baseline Area of Impairment: Safety/judgement                         Safety/Judgement: Decreased awareness of safety     General Comments: Pt agitated throughout session wanting a drink through his peg tube- pt's RN alerted of needs. pt denying need for ice  chips.        Exercises     Shoulder Instructions       General Comments Pt agreeable to therapy. Ambulating in room and hallway with minguardA for IV lines~125 feet. Pt agitated that he is unable to go home yet.    Pertinent Vitals/ Pain       Pain Assessment: Faces Faces Pain Scale: Hurts a little bit Pain Location: stomach Pain Descriptors / Indicators: Grimacing Pain Intervention(s): Monitored during session  Home Living Family/patient expects to be  discharged to:: Private residence Living Arrangements: Spouse/significant other                                      Prior Functioning/Environment Level of Independence: Independent            Frequency  Min 2X/week        Progress Toward Goals  OT Goals(current goals can now be found in the care plan section)     Acute Rehab OT Goals Patient Stated Goal: to go home OT Goal Formulation: With patient Time For Goal Achievement: 12/14/18 Potential to Achieve Goals: Good  Plan      Co-evaluation                 AM-PAC OT "6 Clicks" Daily Activity     Outcome Measure   Help from another person eating meals?: Total Help from another person taking care of personal grooming?: A Little Help from another person toileting, which includes using toliet, bedpan, or urinal?: A Little Help from another person bathing (including washing, rinsing, drying)?: A Little Help from another person to put on and taking off regular upper body clothing?: None Help from another person to put on and taking off regular lower body clothing?: None 6 Click Score: 18    End of Session Equipment Utilized During Treatment: Gait belt;Oxygen      Activity Tolerance Patient tolerated treatment well   Patient Left in bed;with call bell/phone within reach   Nurse Communication Mobility status;Other (comment)(need for liquid/drink through peg as requested by pt)        Time: [id number] OT Time Calculation (min): 21 min  Charges: OT General Charges $OT Visit: 1 Visit OT Treatments $Self Care/Home Management : 8-22 mins  Darryl Nestle) Marsa Aris OTR/L Acute Rehabilitation Services Pager: [phone] Office: [phone]   Audie Pinto 11/30/2018, 4:11 PM

## 2018-11-30 NOTE — Progress Notes (Addendum)
PCCM INTERVAL PROGRESS NOTE  Patient tolerated 28% all morning just as he had for several days prior.  Trach changed to 4 shiley cuffless from 6 cuffed over tube changer. Patient complaining of discomfort related to removal of 6 cuffed. Otherwise no complications. Patient tolerated well.   Phonation much improved with trach occlusion as compared with #6. Consider additional SLP eval.    Georgann Housekeeper, AGACNP-BC [place] Pager [phone] or [phone]  11/30/2018 12:29 PM

## 2018-11-30 NOTE — Procedures (Signed)
Tracheostomy Change Note  Patient Details:   Name: Tony Berry DOB: 28-Aug-1960 MRN: [id number]    Airway Documentation:     Evaluation  O2 sats: stable throughout Complications: No apparent complications Patient did tolerate procedure well. Bilateral Breath Sounds: Clear, Diminished    Lurline Idol was changed to a #4 cuffless shiley over a tube exchanger without any complications. Lurline Idol was secured with trach ties, good color change noted on CO2 detector. RT was assisted by Eddie Dibbles, NP with CCM.   Claretta Fraise 11/30/2018, 12:25 PM

## 2018-12-01 MED ORDER — LORAZEPAM 2 MG/ML IJ SOLN
0.5000 mg | Freq: Once | INTRAMUSCULAR | Status: AC
  Administered 2018-12-01: 0.5 mg via INTRAVENOUS
  Filled 2018-12-01: qty 1

## 2018-12-01 MED ORDER — LORAZEPAM 2 MG/ML IJ SOLN
0.5000 mg | INTRAMUSCULAR | Status: DC | PRN
  Administered 2018-12-01 – 2018-12-06 (×17): 0.5 mg via INTRAVENOUS
  Filled 2018-12-01 (×18): qty 1

## 2018-12-01 NOTE — Progress Notes (Signed)
Pt restless. Requested Ativan. Msg sent to Dr.Schorr for orders

## 2018-12-01 NOTE — TOC Progression Note (Signed)
Transition of Care (TOC) - Progression Note    Patient Details  Name: Tony Berry MRN: [id number] Date of Birth: 04/10/1960  Transition of Care (TOC) CM/SW Contact   F , RN Phone Number: 12/01/2018, 3:32 PM  Clinical Narrative:    [facility] trach team met with the patients wife today and went over the needs for home. Currently the patient's home is not in good enough condition to support the DME he is going to need. [facility] is going to reach out to charity companies and attempt to get repairs done to the home. This may take as long as 2 weeks. MD is aware.  Pts wife and son are going to meet with Respiratory therapy on Sat 2pm for trach education. CM will updated the charge Rn on [address] [place].  TOC following.   Expected Discharge Plan: Long Term Nursing Home Barriers to Discharge: Continued Medical Work up  Expected Discharge Plan and Services Expected Discharge Plan: Long Term Nursing Home   Discharge Planning Services: CM Consult   Living arrangements for the past 2 months: Apartment                                       Social Determinants of Health (SDOH) Interventions    Readmission Risk Interventions Readmission Risk Prevention Plan 10/01/2018  Transportation Screening Complete  PCP or Specialist Appt within 3-5 Days Complete  HRI or Home Care Consult Complete  Social Work Consult for Recovery Care Planning/Counseling Complete  Palliative Care Screening Complete  Medication Review (RN Care Manager) Complete  Some recent data might be hidden   

## 2018-12-01 NOTE — Progress Notes (Signed)
PROGRESS NOTE    Tony Berry  [id number] DOB: [birth year] DOA: 11/16/2018 PCP: Cher Nakai, MD    Brief Narrative:   58 year old gentleman with prior history of head and neck cancer status post radiation therapy, esophageal cancer, recurrent aspiration pneumonia, COPD, hypertension, noncompliant to aspiration precautions presented to ED on 8/25 unresponsive and hypoxemic requiring intubation.  And he was found to have left lower lobe opacification consistent with aspiration pneumonia and was started on  Assessment & Plan:   Principal Problem:   Acute on chronic respiratory failure with hypoxia (HCC) Active Problems:   Protein-calorie malnutrition, severe   Endotracheally intubated   Head and neck cancer (HCC)   S/P percutaneous endoscopic gastrostomy (PEG) tube placement ([place])   Cachexia ([place])   Failure to thrive in adult   Hypotension due to drugs   Tracheostomy tube present ([place])    Acute on chronic respiratory failure with hypoxia status post multiple intubation/extubation followed by trach placement Probably secondary to recurrent aspiration pneumonias as patient is noncompliant to his diet. He is currently has cuffless #6 and SLP eval pending for [facility] valve use.  Suspect patient will go home and he will start taking oral intake despite n.p.o. status.  And he is at high risk for recurrent aspiration pneumonias, and worsening respiratory status and death. This was discussed in detail with the patient,but he continues to request food via oral despite cautionary advice.  Will request palliative care consult for goals of care .     Status post PEG placement/dysphagia with severe protein calorie malnutrition Continue with tube feeds and he is recommended to stay n.p.o. as per SLP.   History of head and neck cancer status post radiation Pain control with oxycodone and Neurontin.  Anemia of chronic disease Transfuse PRBC to keep hemoglobin greater than 7.    Anxiety continue with Klonopin and Seroquel.  Cachexia/failure to thrive Status post PEG placement and on tube feeds.   Stage II sacral pressure injury Wound care consulted and recommendations given.  DVT prophylaxis: Lovenox Code Status: DNR Family Communication: None at bedside Disposition Plan: Home versus SNF, waiting for home care to be arranged by case management.  Consultants:   PCCM  Palliative care   Procedures: MULTIPLE ETT.  S/p tracheostomy  On 8/30  s/p cuffless shiley 6 on 11/29/2018.    Antimicrobials: completed rocephin for 7 days.   Subjective: Wants ice cream via the PEG tube.  Wants to know when he can go home.   Objective: Vitals:   12/01/18 0600 12/01/18 0729 12/01/18 0731 12/01/18 0808  BP:  (!) 99/57    Pulse: 100 99 96 (!) 104  Resp: (!) 22 20 20 13   Temp:  99.1 F (37.3 C)    TempSrc:  Oral    SpO2: 94% 94% 97% 95%  Weight:      Height:        Intake/Output Summary (Last 24 hours) at 12/01/2018 1022 Last data filed at 12/01/2018 0512 Gross per 24 hour  Intake 495 ml  Output 1525 ml  Net -1030 ml   Filed Weights   11/28/18 0316 11/30/18 0304 12/01/18 0305  Weight: 43 kg 45.8 kg 46.1 kg    Examination:  General exam: Cachectic looking gentleman, s/p trach on RA , has 4 Shiley cuff less,  Respiratory system: Coarse breath sounds, air entry fair, scattered rhonchi, no wheezing. Cardiovascular system: S1 & S2 heard, RRR.  No pedal edema. Gastrointestinal system: Abdomen is nondistended, soft and  nontender. Normal bowel sounds heard. S/P peg , PEG site appears okay.  Central nervous system: Alert and oriented. Grossly non focal.  Extremities: no pedal edema.  Skin: stage 1 pressure injuries on the bilateral heels, with intact skin , stage 2 pressure injury over the sacrum.  Psychiatry: . Mood & affect appropriate.     Data Reviewed: I have personally reviewed following labs and imaging studies  CBC: Recent Labs  Lab 11/27/18  0453  WBC 8.6  HGB 9.2*  HCT 30.0*  MCV 88.2  PLT [id number]   Basic Metabolic Panel: Recent Labs  Lab 11/27/18 0453  NA 136  K 3.9  CL 99  CO2 30  GLUCOSE 122*  BUN 15  CREATININE 0.31*  CALCIUM 8.6*   GFR: Estimated Creatinine Clearance: 66.4 mL/min (A) (by C-G formula based on SCr of 0.31 mg/dL (L)). Liver Function Tests: No results for input(s): AST, ALT, ALKPHOS, BILITOT, PROT, ALBUMIN in the last 168 hours. No results for input(s): LIPASE, AMYLASE in the last 168 hours. No results for input(s): AMMONIA in the last 168 hours. Coagulation Profile: No results for input(s): INR, PROTIME in the last 168 hours. Cardiac Enzymes: No results for input(s): CKTOTAL, CKMB, CKMBINDEX, TROPONINI in the last 168 hours. BNP (last 3 results) No results for input(s): PROBNP in the last 8760 hours. HbA1C: No results for input(s): HGBA1C in the last 72 hours. CBG: Recent Labs  Lab 11/26/18 1548 11/27/18 0834 11/27/18 1222 11/29/18 1728 11/29/18 1800  GLUCAP 116* 129* 139* 59* 120*   Lipid Profile: No results for input(s): CHOL, HDL, LDLCALC, TRIG, CHOLHDL, LDLDIRECT in the last 72 hours. Thyroid Function Tests: No results for input(s): TSH, T4TOTAL, FREET4, T3FREE, THYROIDAB in the last 72 hours. Anemia Panel: No results for input(s): VITAMINB12, FOLATE, FERRITIN, TIBC, IRON, RETICCTPCT in the last 72 hours. Sepsis Labs: No results for input(s): PROCALCITON, LATICACIDVEN in the last 168 hours.  No results found for this or any previous visit (from the past 240 hour(s)).       Radiology Studies: No results found.      Scheduled Meds: . acetaminophen (TYLENOL) oral liquid 160 mg/5 mL  650 mg Per Tube Q6H  . aspirin  81 mg Per Tube Daily  . chlorhexidine  15 mL Mouth Rinse BID  . enoxaparin (LOVENOX) injection  30 mg Subcutaneous Q24H  . feeding supplement (OSMOLITE 1.5 CAL)  356 mL Per Tube QID  . free water  120 mL Per Tube QID  . gabapentin  300 mg Per Tube Q8H   . mouth rinse  15 mL Mouth Rinse q12n4p  . oxyCODONE  15 mg Per Tube Q4H  . pantoprazole sodium  40 mg Per Tube QHS  . QUEtiapine  50 mg Oral QHS  . senna-docusate  1 tablet Per Tube Daily   Continuous Infusions: . sodium chloride Stopped (11/20/18 0853)  . sodium chloride Stopped (11/22/18 1900)     LOS: 15 days    Time spent: 36 minutes.     Hosie Poisson, MD [facility] Pager [phone]   If 7PM-7AM, please contact night-coverage [email or web address] Password [email or web address] 12/01/2018, 10:22 AM

## 2018-12-01 NOTE — Progress Notes (Signed)
  Speech Language Pathology Treatment: [name] Speaking valve  Patient Details Name: Tony Berry MRN: [id number] DOB: 1960/05/17 Today's Date: 12/01/2018 Time: [id number] SLP Time Calculation (min) (ACUTE ONLY): 13 min  Assessment / Plan / Recommendation Clinical Impression  Pt now has a #4 cuffless trach and shows good improvement with PMV tolerance. PMV was placed for 10 minutes today, removed due to pt lethargy and not because of any signs of intolerance. No back pressure was observed. His voice is dysphonic (question what his baseline is?); Mod cues provided for increased volume and pausing between words to increase intelligibility. Recommend wearing PMV intermittently during the day when pt is more alert and full staff supervision can be provided. SLP will f/u for additional trials and education.    HPI HPI: Patient is a 58 y.o. male with PMH: head and neck cancer s/p radiation, chronic respiratory failure on 3L O2 at baseline, HTN, hyponatremia, severe protient calorie malnutrition, PVD, dysphagia, recent admission last month for aspiration PNA but without intubation, has a PEG but continues to eat and drink and reportedly not following SLP's recommendations. Most recent MBS was completed 03/12/18 as inpatient with results: chronic pharyngeal dysphagia  with consistent penetration of all bolus consistencies into larynx; portion of penetrates passed through vocal folds and into trachea resulting in sensed aspiration. As nectar thick liquids were tolerated slightly better than thin liquids, recommendation was for Dysphagia 2 (fine chop), nectar thick liquids, with additional recommendation for ENT repeat laryngoscopy.      SLP Plan  Continue with current plan of care       Recommendations         Patient may use Passy-Muir Speech Valve: Intermittently with supervision PMSV Supervision: Full         Oral Care Recommendations: Oral care QID Follow up Recommendations: Skilled  Nursing facility SLP Visit Diagnosis: Aphonia (R49.1) Plan: Continue with current plan of care       GO                Venita Sheffield Perrin Gens 12/01/2018, 2:15 PM  Pollyann Glen, M.A. [place] Acute Environmental education officer [phone] Office [phone]

## 2018-12-01 NOTE — Progress Notes (Signed)
When I came into rm pt had a partial can of Coke on the table. I explained that he wasn't supposed to have anything by mouth but ice. Pt stated he poured it into his GTube. Explained that he shouldn't be putting it into his GTube either.

## 2018-12-01 NOTE — Progress Notes (Signed)
Physical Therapy Treatment Patient Details Name: Tony Berry MRN: [id number] DOB: 06/19/1960 Today's Date: 12/01/2018    History of Present Illness 58 year old male with PMH head and neck cancer s/p radiation therapy, COPD, and recurrent aspiration. He has PEG in place. He has recent admissions to [facility] and [facility] for recurrent aspiration most recently 8/4 when he required intubation.8/25 he became unresponsive at home and his wife called EMS. Found to haveAcute on chronic hypoxemic/hypercabric respiratory failure likely secondary to chronic aspiration, high dose Methadone and underlying emphysema andAcute encephalopathy secondary to acute on chronic respiratory failure. Intubated 8/25, extubated 8/27 and reintubated same day. Trached 8/30 and now on trach collar    PT Comments    Patient with decreased alertness today and mildly confused regarding getting to eat lunch.  Needed assist to stand due to R foot sliding out from under him then limited gait due to pain and fatigue.  Still no greater than min A, but appropriate for SNF level rehab.  If patient refusing rehab will need HHPT, aide and 24 hour assist available.  PT to follow acutely.   Follow Up Recommendations  SNF     Equipment Recommendations       Recommendations for Other Services       Precautions / Restrictions Precautions Precautions: Fall Precaution Comments: PEG tube baseline; new tracheostomy    Mobility  Bed Mobility Overal bed mobility: Modified Independent                Transfers Overall transfer level: Needs assistance Equipment used: Rolling walker (2 wheeled) Transfers: Sit to/from Stand Sit to Stand: Min assist         General transfer comment: min A for balance and to block R foot as sliding forward wearing slippers  Ambulation/Gait Ambulation/Gait assistance: Min assist Gait Distance (Feet): 120 Feet Assistive device: Rolling walker (2 wheeled) Gait Pattern/deviations:  Step-to pattern;Step-through pattern;Shuffle;Trunk flexed;Drifts right/left     General Gait Details: RA throughout ambulation with SpO2 at lowest 88%.  Mild R limited stance time, quickly fatigues with ambulation min A for balance throughout   Stairs             Wheelchair Mobility    Modified Rankin (Stroke Patients Only)       Balance Overall balance assessment: Needs assistance Sitting-balance support: Feet supported;No upper extremity supported Sitting balance-Leahy Scale: Good     Standing balance support: Bilateral upper extremity supported   Standing balance comment: needing assist for safety in standing when without UE support                            Cognition Arousal/Alertness: Lethargic Behavior During Therapy: Flat affect Overall Cognitive Status: Impaired/Different from baseline Area of Impairment: Safety/judgement;Memory;Orientation                 Orientation Level: Disoriented to;Situation   Memory: Decreased short-term memory   Safety/Judgement: Decreased awareness of safety;Decreased awareness of deficits     General Comments: asking where his lunch is and re-oriented to ice chips only      Exercises      General Comments        Pertinent Vitals/Pain Pain Assessment: Faces Faces Pain Scale: Hurts even more Pain Location: neck, throat Pain Descriptors / Indicators: Discomfort Pain Intervention(s): Monitored during session;Patient requesting pain meds-RN notified    Home Living  Prior Function            PT Goals (current goals can now be found in the care plan section) Progress towards PT goals: Progressing toward goals    Frequency    Min 3X/week      PT Plan Current plan remains appropriate    Co-evaluation              AM-PAC PT "6 Clicks" Mobility   Outcome Measure  Help needed turning from your back to your side while in a flat bed without using  bedrails?: None Help needed moving from lying on your back to sitting on the side of a flat bed without using bedrails?: None Help needed moving to and from a bed to a chair (including a wheelchair)?: A Little Help needed standing up from a chair using your arms (e.g., wheelchair or bedside chair)?: A Little Help needed to walk in hospital room?: A Little Help needed climbing 3-5 steps with a railing? : A Little 6 Click Score: 20    End of Session   Activity Tolerance: Patient limited by fatigue Patient left: in bed;with call bell/phone within reach;with bed alarm set   PT Visit Diagnosis: Other abnormalities of gait and mobility (R26.89);Muscle weakness (generalized) (M62.81);Adult, failure to thrive (R62.7)     Time: 1232-1247 PT Time Calculation (min) (ACUTE ONLY): 15 min  Charges:  $Gait Training: 8-22 mins                     Magda Kiel, [place] [phone] 12/01/2018    Reginia Naas 12/01/2018, 1:48 PM

## 2018-12-01 NOTE — Progress Notes (Signed)
Daily Progress Note   Patient Name: Tony Berry       Date: 12/01/2018 DOB: 1960-06-29  Age: 58 y.o. MRN#: [id number] Attending Physician: Hosie Poisson, MD Primary Care Physician: Cher Nakai, MD Admit Date: 11/16/2018  Reason for Consultation/Follow-up: Establishing goals of care  Subjective: I saw and examined Tony Berry this afternoon.  He is lying in bed with trach collar in place.  He appears anxious during encounter and reports being frustrated.    We discussed his clinical course as well as options for care moving forward.  See below.  Length of Stay: 15  Current Medications: Scheduled Meds:  . acetaminophen (TYLENOL) oral liquid 160 mg/5 mL  650 mg Per Tube Q6H  . aspirin  81 mg Per Tube Daily  . chlorhexidine  15 mL Mouth Rinse BID  . enoxaparin (LOVENOX) injection  30 mg Subcutaneous Q24H  . feeding supplement (OSMOLITE 1.5 CAL)  356 mL Per Tube QID  . free water  120 mL Per Tube QID  . gabapentin  300 mg Per Tube Q8H  . mouth rinse  15 mL Mouth Rinse q12n4p  . oxyCODONE  15 mg Per Tube Q4H  . pantoprazole sodium  40 mg Per Tube QHS  . QUEtiapine  50 mg Oral QHS  . senna-docusate  1 tablet Per Tube Daily    Continuous Infusions: . sodium chloride Stopped (11/20/18 0853)  . sodium chloride Stopped (11/22/18 1900)    PRN Meds: sodium chloride, sodium chloride, docusate, guaiFENesin, ipratropium-albuterol, LORazepam, naLOXone (NARCAN)  injection, ondansetron (ZOFRAN) IV, oxyCODONE  Physical Exam         General: Somewhat lethargic.  On Bipap. Heart: Regular rate and rhythm. No murmur appreciated. Lungs: Good air movement w/ Bipap Abdomen: Soft, nontender, nondistended, positive bowel sounds.  Ext: No significant edema Skin: Warm and dry  Vital Signs: BP 104/69  (BP Location: Right Arm)   Pulse (!) 103   Temp 98.4 F (36.9 C) (Oral)   Resp 19   Ht 5\' 6"  (1.676 m)   Wt 46.1 kg   SpO2 96%   BMI 16.40 kg/m  SpO2: SpO2: 96 % O2 Device: O2 Device: Tracheostomy Collar O2 Flow Rate: O2 Flow Rate (L/min): 5 L/min  Intake/output summary:   Intake/Output Summary (Last 24 hours) at 12/01/2018 2255 Last data filed  at 12/01/2018 0800 Gross per 24 hour  Intake 135 ml  Output 600 ml  Net -465 ml   LBM: Last BM Date: 11/28/18 Baseline Weight: Weight: 45.3 kg Most recent weight: Weight: 46.1 kg       Palliative Assessment/Data:    Flowsheet Rows     Most Recent Value  Intake Tab  Referral Department  Critical care  Unit at Time of Referral  ICU  Palliative Care Primary Diagnosis  Cancer  Date Notified  11/17/18  Palliative Care Type  Return patient Palliative Care  Reason for referral  Clarify Goals of Care  Date of Admission  11/16/18  Date first seen by Palliative Care  11/19/18  # of days Palliative referral response time  2 Day(s)  # of days IP prior to Palliative referral  1  Clinical Assessment  Palliative Performance Scale Score  40%  Psychosocial & Spiritual Assessment  Palliative Care Outcomes  Patient/Family meeting held?  Yes  Who was at the meeting?  spouse  Palliative Care Outcomes  Clarified goals of care, Provided psychosocial or spiritual support      Patient Active Problem List   Diagnosis Date Noted  . Tracheostomy tube present ([place])   . Endotracheally intubated   . Head and neck cancer ([place])   . S/P percutaneous endoscopic gastrostomy (PEG) tube placement ([place])   . Cachexia ([place])   . Failure to thrive in adult   . Hypotension due to drugs   . Acute on chronic respiratory failure with hypoxia ([place]) 11/16/2018  . Encounter for imaging study to confirm orogastric (OG) tube placement   . History of ETT   . Palliative care by specialist   . Acute on chronic respiratory failure with hypoxia and hypercapnia ([place])  10/26/2018  . Pressure injury of skin 10/26/2018  . Aspiration into airway 10/26/2018  . History of head and neck cancer 10/26/2018  . Fall at home, initial encounter 09/28/2018  . Goals of care, counseling/discussion   . DNR (do not resuscitate) discussion   . Chest discomfort 05/06/2018  . Dysphagia 04/08/2018  . Aspiration pneumonia ([place]) 04/04/2018  . Acute on chronic respiratory failure ([place]) 03/15/2018  . Carditis   . Troponin level elevated 03/11/2018  . Protein-calorie malnutrition, severe 03/11/2018  . Hyponatremia 03/11/2018  . Hypophosphatemia 03/11/2018  . Multifocal pneumonia 03/10/2018    Palliative Care Assessment & Plan   Patient Profile: 58 y.o. male  with past medical history of esophageal cancer s/p radiation, recurrent aspiration, dysphagia, COPD, and PEG placement admitted on 11/16/2018 with respiratory failure and required intubation. He was extubated 8/27 but required reintubation shortly after. Recently admitted on 8/4 for respiratory failure and required intubation - seen by palliative team during that admission. This admission, imaging reveals LLL opacification. He was required pressors (off as of 8/28 but BP soft). 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 PMT consulted for [place].  Assessment: Patient Active Problem List   Diagnosis Date Noted  . Tracheostomy tube present ([place])   . Endotracheally intubated   . Head and neck cancer ([place])   . S/P percutaneous endoscopic gastrostomy (PEG) tube placement ([place])   . Cachexia ([place])   . Failure to thrive in adult   . Hypotension due to drugs   . Acute on chronic respiratory failure with hypoxia ([place]) 11/16/2018  . Encounter for imaging study to confirm orogastric (OG) tube placement   . History of ETT   . Palliative care by specialist   . Acute  on chronic respiratory failure with hypoxia and hypercapnia ([place]) 10/26/2018  . Pressure injury of skin 10/26/2018  . Aspiration into airway 10/26/2018  . History  of head and neck cancer 10/26/2018  . Fall at home, initial encounter 09/28/2018  . Goals of care, counseling/discussion   . DNR (do not resuscitate) discussion   . Chest discomfort 05/06/2018  . Dysphagia 04/08/2018  . Aspiration pneumonia ([place]) 04/04/2018  . Acute on chronic respiratory failure ([place]) 03/15/2018  . Carditis   . Troponin level elevated 03/11/2018  . Protein-calorie malnutrition, severe 03/11/2018  . Hyponatremia 03/11/2018  . Hypophosphatemia 03/11/2018  . Multifocal pneumonia 03/10/2018    Recommendations/Plan:  Anxiety: Reports worse and that medication he was ordered last night (Ativan 0.5mg ) was more effective for him.  Stop klonopin and will plan trial of ativan 0.5mg  prn.  He reports 1) being home, 2) maintaining independence, and 3) being able to have some intake are most important things to him.  We discussed recurrent aspiration and reason for NPO status.  Continue current care.  I will reach out to other members of care team tomorrow for input to better determine his likely course moving forward- he had originally consented to trach with understanding it may be temporary.  Not sure if we have better idea of likelihood that he will be able to have trach reversed at some point.   After getting more input, plan for another meeting tomorrow.    Code Status:    Code Status Orders  (From admission, onward)         Start     Ordered   11/17/18 0017  Limited resuscitation (code)  Continuous    Question Answer Comment  In the event of cardiac or respiratory ARREST: Initiate Code Blue, Call Rapid Response No   In the event of cardiac or respiratory ARREST: Perform CPR No   In the event of cardiac or respiratory ARREST: Perform Intubation/Mechanical Ventilation Yes   In the event of cardiac or respiratory ARREST: Use NIPPV/BiPAp only if indicated Yes   In the event of cardiac or respiratory ARREST: Administer ACLS medications if indicated Yes   In the event of  cardiac or respiratory ARREST: Perform Defibrillation or Cardioversion if indicated No      11/17/18 0016        Code Status History    Date Active Date Inactive Code Status Order ID Comments User Context   11/16/2018 2342 11/17/2018 0016 Full Code [id number]  Kandice Hams, MD Inpatient   10/26/2018 1946 10/31/2018 1706 Full Code [id number]  Roxanne Mins, MD Inpatient   09/29/2018 0110 10/02/2018 1245 Partial Code [id number]  Toy Baker, MD Inpatient   04/04/2018 1449 04/12/2018 1448 Partial Code [id number]  Ina Homes, MD Inpatient   04/04/2018 1242 04/04/2018 1439 DNR [id number]  Ina Homes, MD ED   03/10/2018 1806 03/15/2018 2047 DNR [id number]  Katherine Roan, MD ED   Advance Care Planning Activity       Prognosis:   Guarded  Discharge Planning:  To Be Determined  Care plan was discussed with patient, bedside RN  Thank you for allowing the Palliative Medicine Team to assist in the care of this patient.   Time In: 1800 Time Out: 1850 Total Time 50 Prolonged Time Billed No      Greater than 50%  of this time was spent counseling and coordinating care related to the above assessment and plan.  Micheline Rough, MD  Please contact Palliative Medicine Team  phone at [phone] for questions and concerns.

## 2018-12-01 NOTE — Progress Notes (Signed)
Pt would like the Flu shot before dischg from hospital

## 2018-12-01 NOTE — Progress Notes (Signed)
Pt c/o stomach and neck pain (where trach is) pt also c/o feeling nauseated. Gave pt prn dose of Oxy IR 10 mg per GT and went to give IV Zofran and pt had pulled out his IV. New site started RAC 22g and Zofran given. Pt asleep before IV was started.

## 2018-12-01 NOTE — Progress Notes (Signed)
Pt given now dose of IV Ativan at 0540. Spoke with pt for 10 min. Pt tearful. Stated all he wants to do is eat normal food and drink. Wanting to know why he can't do that. Explained about the "flap" in his throat that covers the tube that goes into his lungs so the food and drink doesn't go into his lung and he gets Aspiration Pneumonia and his isn't working correctly so it allows whatever he's drinking or eating to get into his lungs. Explained also sometimes when a person has Acid Reflux or GERD bad enough the acid comes up into their throat and then goes down into their lungs and they get Aspiration Pneumonia that way. Pt voiced understanding but still wants to eat or drink. I advised pt not to because there's only so many times you can get Aspiration Pneumonia before he won't be able to be treated. He stated that's fine with him. I told him if he really wants to eat and drink and he's going to do it no matter what he needs to make sure his family and himself doesn't panic when he can't breathe and call EMS. I told him it is very painful and anxiety provoking to "literally sufficate" because you can't breathe because of the Pneumonia. I told him it is a horrible, painful, and anxious way to die. I instructed him to talk to his doctors and family more about his wishes if eating and drinking is really what he wants to do.

## 2018-12-02 LAB — GLUCOSE, CAPILLARY: Glucose-Capillary: 72 mg/dL (ref 70–99)

## 2018-12-02 NOTE — Progress Notes (Signed)
Speech Pathology  Spoke again with pt, who confirmed that he does NOT want ice cream to eat, he wants to let it melt and then deliver via PEG. We talked again about how difficult his situation is; provided encouragement.  D/W RN.  Stayce Delancy L. [name], [place] Office number [phone] Pager [phone]

## 2018-12-02 NOTE — TOC Progression Note (Signed)
Transition of Care [facility]) - Progression Note    Patient Details  Name: Tony Berry MRN: [id number] Date of Birth: 12/26/1960  [facility] CM/SW Contact  Pollie Friar, RN Phone Number: 12/02/2018, 2:56 PM  Clinical Narrative:    CM updated the patient about the plan and he voiced understanding.  CM did a [facility] referral to see about getting the patients home repaired sooner.  TOC following.   Expected Discharge Plan: Long Term Nursing Home Barriers to Discharge: Continued Medical Work up  Expected Discharge Plan and Services Expected Discharge Plan: [place]   Discharge Planning Services: CM Consult   Living arrangements for the past 2 months: Apartment                                       Social Determinants of Health (SDOH) Interventions    Readmission Risk Interventions Readmission Risk Prevention Plan 10/01/2018  Transportation Screening Complete  PCP or Specialist Appt within 3-5 Days Complete  HRI or [place] Complete  Social Work Consult for [place] Planning/Counseling Complete  Palliative Care Screening Complete  Medication Review Press photographer) Complete  Some recent data might be hidden

## 2018-12-02 NOTE — Progress Notes (Signed)
PROGRESS NOTE    Tony Berry  [id number] DOB: Jul 27, 1960 DOA: 11/16/2018 PCP: Cher Nakai, MD    Brief Narrative:   58 year old gentleman with prior history of head and neck cancer status post radiation therapy, esophageal cancer, recurrent aspiration pneumonia, COPD, hypertension, noncompliant to aspiration precautions presented to ED on 8/25 unresponsive and hypoxemic requiring intubation.  And he was found to have left lower lobe opacification consistent with aspiration pneumonia and was started on broad-spectrum antibiotics.  He was extubated on 8/27  But re intubated and underwent tracheostomy on 8/30.  PT eval recommended SNF.  But patient is adamantly refusing to go to rehab and wants to go home.  Meanwhile social worker case management working to provide home health services on discharge. PCCM changed his trach to cuffless Shiley 6 on 11/30/2018.  Currently waiting for disposition.    Assessment & Plan:   Principal Problem:   Acute on chronic respiratory failure with hypoxia (HCC) Active Problems:   Protein-calorie malnutrition, severe   Endotracheally intubated   Head and neck cancer (HCC)   S/P percutaneous endoscopic gastrostomy (PEG) tube placement (HCC)   Cachexia (HCC)   Failure to thrive in adult   Hypotension due to drugs   Tracheostomy tube present (HCC)    Acute on chronic respiratory failure with hypoxia status post multiple intubation/extubation followed by trach placement Probably secondary to recurrent aspiration pneumonias as patient is noncompliant to his diet. He is currently has cuffless #6 and SLP eval requested for [facility] valve use.  Suspect patient will go home and he will start taking oral intake despite n.p.o. status.  And he is at high risk for recurrent aspiration pneumonias, and worsening respiratory status and death. This was discussed in detail with the patient,but he continues to request food via oral despite cautionary advice.  Will  request palliative care consult for goals of care . Today pt does not appear to be in distressand is requesting ice cream via the PEG tube.     Status post PEG placement/dysphagia with severe protein calorie malnutrition Continue with tube feeds and he is recommended to stay n.p.o. as per SLP.   History of head and neck cancer status post radiation Pain control with oxycodone and Neurontin. No changes in medications.   Anemia of chronic disease Transfuse PRBC to keep hemoglobin greater than 7. H&H stable around 9.2/30   Anxiety continue with Klonopin and Seroquel.  Cachexia/failure to thrive Status post PEG placement and on tube feeds. Patient is requesting ice cream via PEG tube.  Nutrition on board and assisting with management.   Stage II sacral pressure injury Wound care consulted and recommendations given.  One episode of hypoglycemia late yesterday. Recommended frequent nutritional supplementation to prevent hypoglycemia.    DVT prophylaxis: Lovenox Code Status: DNR Family Communication: None at bedside Disposition Plan: Home health, waiting for home care to be arranged by case management.  Consultants:   PCCM  Palliative care   Procedures: MULTIPLE ETT.  S/p tracheostomy  On 8/30  s/p cuffless shiley 6 on 11/29/2018.    Antimicrobials: completed rocephin for 7 days.   Subjective: No chest pain, shortness of breath, nausea vomiting or abdominal pain.  Objective: Vitals:   12/02/18 0800 12/02/18 0900 12/02/18 1107 12/02/18 1247  BP:  90/60 106/73 (!) 126/105  Pulse:  85 92 80  Resp:  15 14 (!) 26  Temp:    97.7 F (36.5 C)  TempSrc:    Oral  SpO2:  93%  94% 90%  Weight: 46 kg     Height:        Intake/Output Summary (Last 24 hours) at 12/02/2018 1306 Last data filed at 12/01/2018 2300 Gross per 24 hour  Intake -  Output 600 ml  Net -600 ml   Filed Weights   11/30/18 0304 12/01/18 0305 12/02/18 0800  Weight: 45.8 kg 46.1 kg 46 kg     Examination:  General exam: Cachectic looking gentleman, s/p trach on RA , has 6 Shiley cuff less,  Respiratory system: Bilateral air entry fair, coarse breath sounds, no wheezing or rhonchi Cardiovascular system: S1-S2 heard, regular rate rhythm, no pedal edema Gastrointestinal system: Abdomen is soft, nontender and nondistended with good bowel sounds, PEG site appears good Central nervous system: Alert and oriented to person and place, able to answer all questions appropriately Extremities: No pedal edema, cyanosis or clubbing Skin: stage 1 pressure injuries on the bilateral heels, with intact skin , stage 2 pressure injury over the sacrum.  Psychiatry: .  Patient is slightly anxious today    Data Reviewed: I have personally reviewed following labs and imaging studies  CBC: Recent Labs  Lab 11/27/18 0453  WBC 8.6  HGB 9.2*  HCT 30.0*  MCV 88.2  PLT [id number]   Basic Metabolic Panel: Recent Labs  Lab 11/27/18 0453  NA 136  K 3.9  CL 99  CO2 30  GLUCOSE 122*  BUN 15  CREATININE 0.31*  CALCIUM 8.6*   GFR: Estimated Creatinine Clearance: 66.3 mL/min (A) (by C-G formula based on SCr of 0.31 mg/dL (L)). Liver Function Tests: No results for input(s): AST, ALT, ALKPHOS, BILITOT, PROT, ALBUMIN in the last 168 hours. No results for input(s): LIPASE, AMYLASE in the last 168 hours. No results for input(s): AMMONIA in the last 168 hours. Coagulation Profile: No results for input(s): INR, PROTIME in the last 168 hours. Cardiac Enzymes: No results for input(s): CKTOTAL, CKMB, CKMBINDEX, TROPONINI in the last 168 hours. BNP (last 3 results) No results for input(s): PROBNP in the last 8760 hours. HbA1C: No results for input(s): HGBA1C in the last 72 hours. CBG: Recent Labs  Lab 11/27/18 0834 11/27/18 1222 11/29/18 1728 11/29/18 1800 12/02/18 1207  GLUCAP 129* 139* 59* 120* 72   Lipid Profile: No results for input(s): CHOL, HDL, LDLCALC, TRIG, CHOLHDL, LDLDIRECT in the last  72 hours. Thyroid Function Tests: No results for input(s): TSH, T4TOTAL, FREET4, T3FREE, THYROIDAB in the last 72 hours. Anemia Panel: No results for input(s): VITAMINB12, FOLATE, FERRITIN, TIBC, IRON, RETICCTPCT in the last 72 hours. Sepsis Labs: No results for input(s): PROCALCITON, LATICACIDVEN in the last 168 hours.  No results found for this or any previous visit (from the past 240 hour(s)).       Radiology Studies: No results found.      Scheduled Meds: . acetaminophen (TYLENOL) oral liquid 160 mg/5 mL  650 mg Per Tube Q6H  . aspirin  81 mg Per Tube Daily  . chlorhexidine  15 mL Mouth Rinse BID  . enoxaparin (LOVENOX) injection  30 mg Subcutaneous Q24H  . feeding supplement (OSMOLITE 1.5 CAL)  356 mL Per Tube QID  . free water  120 mL Per Tube QID  . gabapentin  300 mg Per Tube Q8H  . mouth rinse  15 mL Mouth Rinse q12n4p  . oxyCODONE  15 mg Per Tube Q4H  . pantoprazole sodium  40 mg Per Tube QHS  . QUEtiapine  50 mg Oral QHS  . senna-docusate  1 tablet Per Tube Daily   Continuous Infusions: . sodium chloride Stopped (11/20/18 0853)  . sodium chloride Stopped (11/22/18 1900)     LOS: 16 days    Time spent: 34 minutes.     Hosie Poisson, MD [facility] Pager [phone]   If 7PM-7AM, please contact night-coverage [email or web address] Password [email or web address] 12/02/2018, 1:06 PM

## 2018-12-02 NOTE — Progress Notes (Signed)
Daily Progress Note   Patient Name: Tony Berry       Date: 12/02/2018 DOB: 1961-03-12  Age: 58 y.o. MRN#: [id number] Attending Physician: Hosie Poisson, MD Primary Care Physician: Cher Nakai, MD Admit Date: 11/16/2018  Reason for Consultation/Follow-up: Establishing goals of care  Subjective: I saw and examined Mr. Sitzman this afternoon.  Sitting in bed with trach collar.    We discussed his clinical course as well as options for care moving forward.    We discussed realistic options moving forward.  If his goal is to add as much time to his life as possible, recommendation continue NPO and work to transition home with home health.  If his goal is to live life as he wants, including PO intake, I recommended we work to liberalize his diet and transition home with hospice as he is going to aspirate moving forward and this is not a "fixable" problem.  He reports today  Being very frustrated and angry. States staff treats him "like a baby" and he wants to do his own tube feeds and wants to have ice cream to place in his PEG.  I discussed with Dr. Karleen Hampshire and placed orders for these.  He is certainly still a risk to aspirate even without PO intake, and I suspect that he is going to take PO at some point even if he desires to continue aggressive care.  During my conversation today, he was very clear in understanding the risk associated with this, and I think that he has the capacity to make decisions.     Length of Stay: 16  Current Medications: Scheduled Meds:  . acetaminophen (TYLENOL) oral liquid 160 mg/5 mL  650 mg Per Tube Q6H  . aspirin  81 mg Per Tube Daily  . chlorhexidine  15 mL Mouth Rinse BID  . enoxaparin (LOVENOX) injection  30 mg Subcutaneous Q24H  . feeding supplement (OSMOLITE  1.5 CAL)  356 mL Per Tube QID  . free water  120 mL Per Tube QID  . gabapentin  300 mg Per Tube Q8H  . mouth rinse  15 mL Mouth Rinse q12n4p  . oxyCODONE  15 mg Per Tube Q4H  . pantoprazole sodium  40 mg Per Tube QHS  . QUEtiapine  50 mg Oral QHS  . senna-docusate  1 tablet  Per Tube Daily    Continuous Infusions: . sodium chloride Stopped (11/20/18 0853)  . sodium chloride Stopped (11/22/18 1900)    PRN Meds: sodium chloride, sodium chloride, docusate, guaiFENesin, ipratropium-albuterol, LORazepam, naLOXone (NARCAN)  injection, ondansetron (ZOFRAN) IV, oxyCODONE  Physical Exam         General: Awake and alert. Trach collar and PMV in place Heart: Regular rate and rhythm. No murmur appreciated. Lungs: Good air movement Abdomen: Soft, nontender, nondistended, positive bowel sounds.  Ext: No significant edema Skin: Warm and dry  Vital Signs: BP 98/66 (BP Location: Right Arm)   Pulse 82   Temp 97.8 F (36.6 C) (Oral)   Resp 15   Ht 5\' 6"  (1.676 m)   Wt 46 kg   SpO2 94%   BMI 16.37 kg/m  SpO2: SpO2: 94 % O2 Device: O2 Device: Tracheostomy Collar O2 Flow Rate: O2 Flow Rate (L/min): 5 L/min  Intake/output summary:   Intake/Output Summary (Last 24 hours) at 12/02/2018 2318 Last data filed at 12/02/2018 2213 Gross per 24 hour  Intake -  Output 400 ml  Net -400 ml   LBM: Last BM Date: 11/28/18 Baseline Weight: Weight: 45.3 kg Most recent weight: Weight: 46 kg       Palliative Assessment/Data:    Flowsheet Rows     Most Recent Value  Intake Tab  Referral Department  Critical care  Unit at Time of Referral  ICU  Palliative Care Primary Diagnosis  Cancer  Date Notified  11/17/18  Palliative Care Type  Return patient Palliative Care  Reason for referral  Clarify Goals of Care  Date of Admission  11/16/18  Date first seen by Palliative Care  11/19/18  # of days Palliative referral response time  2 Day(s)  # of days IP prior to Palliative referral  1  Clinical  Assessment  Palliative Performance Scale Score  40%  Psychosocial & Spiritual Assessment  Palliative Care Outcomes  Patient/Family meeting held?  Yes  Who was at the meeting?  spouse  Palliative Care Outcomes  Clarified goals of care, Provided psychosocial or spiritual support      Patient Active Problem List   Diagnosis Date Noted  . Tracheostomy tube present ([place])   . Endotracheally intubated   . Head and neck cancer ([place])   . S/P percutaneous endoscopic gastrostomy (PEG) tube placement ([place])   . Cachexia ([place])   . Failure to thrive in adult   . Hypotension due to drugs   . Acute on chronic respiratory failure with hypoxia ([place]) 11/16/2018  . Encounter for imaging study to confirm orogastric (OG) tube placement   . History of ETT   . Palliative care by specialist   . Acute on chronic respiratory failure with hypoxia and hypercapnia ([place]) 10/26/2018  . Pressure injury of skin 10/26/2018  . Aspiration into airway 10/26/2018  . History of head and neck cancer 10/26/2018  . Fall at home, initial encounter 09/28/2018  . Goals of care, counseling/discussion   . DNR (do not resuscitate) discussion   . Chest discomfort 05/06/2018  . Dysphagia 04/08/2018  . Aspiration pneumonia ([place]) 04/04/2018  . Acute on chronic respiratory failure ([place]) 03/15/2018  . Carditis   . Troponin level elevated 03/11/2018  . Protein-calorie malnutrition, severe 03/11/2018  . Hyponatremia 03/11/2018  . Hypophosphatemia 03/11/2018  . Multifocal pneumonia 03/10/2018    Palliative Care Assessment & Plan   Patient Profile: 58 y.o. male  with past medical history of esophageal cancer s/p radiation, recurrent  aspiration, dysphagia, COPD, and PEG placement admitted on 11/16/2018 with respiratory failure and required intubation. He was extubated 8/27 but required reintubation shortly after. Recently admitted on 8/4 for respiratory failure and required intubation - seen by palliative team during that admission.  This admission, imaging reveals LLL opacification. He was required pressors (off as of 8/28 but BP soft). PMT consulted for [place].  Assessment: Patient Active Problem List   Diagnosis Date Noted  . Tracheostomy tube present ([place])   . Endotracheally intubated   . Head and neck cancer ([place])   . S/P percutaneous endoscopic gastrostomy (PEG) tube placement ([place])   . Cachexia ([place])   . Failure to thrive in adult   . Hypotension due to drugs   . Acute on chronic respiratory failure with hypoxia ([place]) 11/16/2018  . Encounter for imaging study to confirm orogastric (OG) tube placement   . History of ETT   . Palliative care by specialist   . Acute on chronic respiratory failure with hypoxia and hypercapnia ([place]) 10/26/2018  . Pressure injury of skin 10/26/2018  . Aspiration into airway 10/26/2018  . History of head and neck cancer 10/26/2018  . Fall at home, initial encounter 09/28/2018  . Goals of care, counseling/discussion   . DNR (do not resuscitate) discussion   . Chest discomfort 05/06/2018  . Dysphagia 04/08/2018  . Aspiration pneumonia ([place]) 04/04/2018  . Acute on chronic respiratory failure ([place]) 03/15/2018  . Carditis   . Troponin level elevated 03/11/2018  . Protein-calorie malnutrition, severe 03/11/2018  . Hyponatremia 03/11/2018  . Hypophosphatemia 03/11/2018  . Multifocal pneumonia 03/10/2018    Recommendations/Plan:  Anxiety: Reports Ativan 0.5mg  more effective for him.    He reports 1) being home, 2) maintaining independence, and 3) being able to have some intake are most important things to him.  We discussed again regarding recurrent aspiration and reason for NPO status.  He will consider options of transition home with hospice vs adherence to recommended regimen.  He is VERY frustrated today.  I have asked staff to allow him to participate in his own feedings and to have ice cream to place in his PEG as desired (he has been doing this himself at home).  We discussed  again risk if he takes in PO and he expresses understanding.  Staff has expressed concern that he may try to sneak PO, and this is a real possibility, however he certainly understands that this places him at high risk for decompensation and death.   Code Status:    Code Status Orders  (From admission, onward)         Start     Ordered   11/17/18 0017  Limited resuscitation (code)  Continuous    Question Answer Comment  In the event of cardiac or respiratory ARREST: Initiate Code Blue, Call Rapid Response No   In the event of cardiac or respiratory ARREST: Perform CPR No   In the event of cardiac or respiratory ARREST: Perform Intubation/Mechanical Ventilation Yes   In the event of cardiac or respiratory ARREST: Use NIPPV/BiPAp only if indicated Yes   In the event of cardiac or respiratory ARREST: Administer ACLS medications if indicated Yes   In the event of cardiac or respiratory ARREST: Perform Defibrillation or Cardioversion if indicated No      11/17/18 0016        Code Status History    Date Active Date Inactive Code Status Order ID Comments User Context   11/16/2018 2342 11/17/2018 [id number]  Full Code [id number]  Kandice Hams, MD Inpatient   10/26/2018 1946 10/31/2018 1706 Full Code [id number]  Roxanne Mins, MD Inpatient   09/29/2018 0110 10/02/2018 1245 Partial Code [id number]  Toy Baker, MD Inpatient   04/04/2018 1449 04/12/2018 1448 Partial Code [id number]  Ina Homes, MD Inpatient   04/04/2018 1242 04/04/2018 1439 DNR [id number]  Ina Homes, MD ED   03/10/2018 1806 03/15/2018 2047 DNR [id number]  Katherine Roan, MD ED   Advance Care Planning Activity       Prognosis:   Guarded  Discharge Planning:  To Be Determined  Care plan was discussed with patient, bedside RN  Thank you for allowing the Palliative Medicine Team to assist in the care of this patient.   Time In: 1700 Time Out: 1745 Total Time 45 Prolonged Time Billed No      Greater than  50%  of this time was spent counseling and coordinating care related to the above assessment and plan.  Micheline Rough, MD  Please contact Palliative Medicine Team phone at [phone] for questions and concerns.

## 2018-12-02 NOTE — Progress Notes (Signed)
Nutrition Follow-up  DOCUMENTATION CODES:   Severe malnutrition in context of chronic illness, Underweight  INTERVENTION:   -Continue bolus feedings of 356 ml (1.5 cans) of Osmolite 1.5 QID -60 ml free water flush before and after each TF administration  Tube feeding regimen provides 2130 kcal (100% of needs), 89 grams of protein, and 1566 ml of H2O.   NUTRITION DIAGNOSIS:   Severe Malnutrition related to chronic illness(head and neck cancer with dysphagia and aspiration, COPD) as evidenced by severe fat depletion, severe muscle depletion.  Ongoing.  GOAL:   Patient will meet greater than or equal to 90% of their needs  Meeting with TF.  MONITOR:   TF tolerance, Vent status, Labs, Weight trends, Skin  ASSESSMENT:   58 yo male admitted with acute on chronic respiratory failure secondary to aspiration requiring intubation, septic shock. PMH includes head and neck cancer with dysphagia and aspriation s/p PEG tube, COPD, malnutrition  8/25 Admitted, Intubated 8/27 Extubated, Re-intubated 8/30 Trach placed 8/31 Trach collar  **RD working remotely**  Patient continues to work with SLP for [employer]. Pt with chronic dysphagia. Pt is NPO. Has been asking staff for PO.  Pt is tolerating bolus feeds of Osmolite 1.5.  Palliative care working with pt to determine [place].  Admission weight: 99 lbs. Current weight: 101 lbs. I/Os: +5.7L since 8/27 UOP 9/9: 600 ml  Medications: IV Zofran Labs reviewed:  CBGs: 59-120  Diet Order:   Diet Order            Diet NPO time specified Except for: Ice Chips  Diet effective midnight              EDUCATION NEEDS:   Not appropriate for education at this time  Skin:  Skin Integrity Issues:: Stage II, Stage I Stage I: b/l heels; iliac crest Stage II: sacrum  Last BM:  9/6  Height:   Ht Readings from Last 1 Encounters:  11/16/18 5\' 6"  (1.676 m)    Weight:   Wt Readings from Last 1 Encounters:  12/01/18 46.1 kg    Ideal  Body Weight:  67.3 kg  BMI:  Body mass index is 16.4 kg/m.  Estimated Nutritional Needs:   Kcal:  1800-2000  Protein:  90-105 grams  Fluid:  > 1.8 L  Clayton Bibles, MS, RD, LDN Inpatient Clinical Dietitian Pager: [phone] After Hours Pager: [phone]

## 2018-12-02 NOTE — Progress Notes (Signed)
  Speech Language Pathology Treatment: [name] Speaking valve  Patient Details Name: CHRITOPHER UMANA MRN: [id number] DOB: 19-Feb-1961 Today's Date: 12/02/2018 Time: [id number] SLP Time Calculation (min) (ACUTE ONLY): 10 min  Assessment / Plan / Recommendation Clinical Impression  F/u for PMV management.  Pt known to this SLP from Dec 2019 admission.  He demonstrates excellent toleration of PMV, and was using it upon my entry to his room.  Voice is low volume and of poor quality. VS are stable with valve in place.  We reviewed conditions under which he should remove valve (during sleep, when uncomfortable or if having difficulty breathing).  Pt demonstrated independence in placing and removing valve. We reviewed methods for cleaning and storing valve; pt verbalized understanding.    Pt has been repeatedly asking staff for ice cream, but remains NPO except for ice chips.   Reviewed Dr. Kirstie Mirza note; plan is to meet with pt again today.  Hopefully pt can resume some limited PO consumption, despite aspiration, to allow for some improved quality of life.    SLP will follow up for education with Mrs. Plotner re: PMV. We were not ordered for swallowing this admission - I don't know that there is any more we can offer in way of help;; unfortunately, Mr. Lineback dysphagia is severe and chronic.    HPI HPI: Patient is a 58 y.o. male with PMH: head and neck cancer s/p radiation, chronic respiratory failure on 3L O2 at baseline, HTN, hyponatremia, severe protient calorie malnutrition, PVD, dysphagia, recent admission last month for aspiration PNA but without intubation, has a PEG but continues to eat and drink and reportedly not following SLP's recommendations. Most recent MBS was completed 03/12/18 as inpatient with results: chronic pharyngeal dysphagia  with consistent penetration of all bolus consistencies into larynx; portion of penetrates passed through vocal folds and into trachea resulting in sensed  aspiration. As nectar thick liquids were tolerated slightly better than thin liquids, recommendation was for Dysphagia 2 (fine chop), nectar thick liquids, with additional recommendation for ENT repeat laryngoscopy.      SLP Plan  Continue with current plan of care       Recommendations  Diet recommendations: NPO      Patient may use Passy-Muir Speech Valve: During all waking hours (remove during sleep) PMSV Supervision: Intermittent         Oral Care Recommendations: Oral care QID SLP Visit Diagnosis: Aphonia (R49.1) Plan: Continue with current plan of care       GO              Ethridge Sollenberger L. [name], [place] CCC/SLP Acute Rehabilitation Services Office number [phone] Pager [phone]  [name] 12/02/2018, 10:20 AM

## 2018-12-03 ENCOUNTER — Inpatient Hospital Stay (HOSPITAL_COMMUNITY): Payer: Medicaid Other

## 2018-12-03 DIAGNOSIS — Z930 Tracheostomy status: Secondary | ICD-10-CM

## 2018-12-03 DIAGNOSIS — Z515 Encounter for palliative care: Secondary | ICD-10-CM

## 2018-12-03 DIAGNOSIS — Z9889 Other specified postprocedural states: Secondary | ICD-10-CM

## 2018-12-03 MED ORDER — ENSURE ENLIVE PO LIQD
237.0000 mL | Freq: Three times a day (TID) | ORAL | Status: DC
  Administered 2018-12-03 – 2018-12-04 (×2): 237 mL via ORAL

## 2018-12-03 MED ORDER — OXYCODONE HCL 5 MG/5ML PO SOLN
20.0000 mg | ORAL | Status: DC | PRN
  Administered 2018-12-07 – 2018-12-09 (×5): 20 mg
  Filled 2018-12-03 (×6): qty 20

## 2018-12-03 MED ORDER — HYDROMORPHONE HCL 1 MG/ML IJ SOLN
1.0000 mg | INTRAMUSCULAR | Status: DC | PRN
  Administered 2018-12-03 – 2018-12-08 (×24): 1 mg via INTRAVENOUS
  Filled 2018-12-03 (×24): qty 1

## 2018-12-03 MED ORDER — RESOURCE THICKENUP CLEAR PO POWD
ORAL | Status: DC | PRN
  Filled 2018-12-03: qty 125

## 2018-12-03 MED ORDER — OXYCODONE HCL 5 MG/5ML PO SOLN
20.0000 mg | ORAL | Status: DC
  Administered 2018-12-03 – 2018-12-09 (×33): 20 mg
  Filled 2018-12-03 (×32): qty 20

## 2018-12-03 NOTE — Progress Notes (Signed)
Daily Progress Note   Patient Name: Tony Berry       Date: 12/03/2018 DOB: November 27, 1960  Age: 58 y.o. MRN#: [id number] Attending Physician: Hosie Poisson, MD Primary Care Physician: Cher Nakai, MD Admit Date: 11/16/2018  Reason for Consultation/Follow-up: Establishing goals of care  Subjective: I saw and examined Tony Berry this afternoon.  Sitting in bed with trach collar.    We discussed his clinical course as well as options for care moving forward.   He has been talking more about returning to his home with hospice with other staff, including during his conversation with pulmonary today.  He continues to report 1) being home, 2) maintaining independence, 3) having pain control and 4) being able to have some intake remain the most important things to him.  We discussed again regarding recurrent aspiration and reason for NPO status.    He has been consider options of transition home with hospice vs adherence to recommended regimen and he reports today that he has "absolutely no life" here in the hospital and would like to transition home with hospice support.  He remains frustrated today.  He has been demanding PO intake, and we again reviewed risk if he takes in PO and he expresses understanding.  We discussed that any liberalization of his diet would be with the understanding that the goal moving forward would be for comfort with transition home with the support of hospice.  Discussed that if he chooses to take p.o. intake and his diet is liberalized for comfort, we would not treat the next pneumonia, including if he aspirates here in the hospital.  I confirmed with him again his DNR status and that we would plan to liberalize his diet with understanding that we would not be treating any  aspiration pneumonia that occurs.  He expressed again that this was his desire and he understands risks.    I then called his wife per his request to discuss his decision to transition home with hospice support.  She reports she disagrees with this decision as she has been working hard to have the house repaired in order to allow him to come home with home health.  I advised her that they need to have a discussion as a family as he is awake, alert, and understands risk and benefits and is  requesting p.o. intake for comfort and to go home with hospice support.    She is planning to come up for trach training tomorrow at 2 PM and I offered to come at that time.  She understands that I have liberalized his diet per her request and he is likely going to take p.o. intake with understanding of risk of aspiration.  She also expressed understanding risk of aspiration, stated she disagreed with him on this, but understood that he was making his own decisions regarding intake.  Length of Stay: 17  Current Medications: Scheduled Meds:   acetaminophen (TYLENOL) oral liquid 160 mg/5 mL  650 mg Per Tube Q6H   aspirin  81 mg Per Tube Daily   chlorhexidine  15 mL Mouth Rinse BID   enoxaparin (LOVENOX) injection  30 mg Subcutaneous Q24H   feeding supplement (ENSURE ENLIVE)  237 mL Oral TID BM   feeding supplement (OSMOLITE 1.5 CAL)  356 mL Per Tube QID   free water  120 mL Per Tube QID   gabapentin  300 mg Per Tube Q8H   mouth rinse  15 mL Mouth Rinse q12n4p   oxyCODONE  20 mg Per Tube Q4H   pantoprazole sodium  40 mg Per Tube QHS   QUEtiapine  50 mg Oral QHS   senna-docusate  1 tablet Per Tube Daily    Continuous Infusions:  sodium chloride Stopped (11/20/18 0853)   sodium chloride Stopped (11/22/18 1900)    PRN Meds: sodium chloride, sodium chloride, docusate, guaiFENesin, HYDROmorphone (DILAUDID) injection, ipratropium-albuterol, LORazepam, ondansetron (ZOFRAN) IV,  oxyCODONE  Physical Exam         General: Awake and alert. Trach collar and PMV in place Heart: Regular rate and rhythm. No murmur appreciated. Lungs: Good air movement Abdomen: Soft, nontender, nondistended, positive bowel sounds.  Ext: No significant edema Skin: Warm and dry  Vital Signs: BP 109/74    Pulse 94    Temp 98.1 F (36.7 C) (Oral)    Resp (!) 27    Ht 5\' 6"  (1.676 m)    Wt 46 kg    SpO2 94%    BMI 16.37 kg/m  SpO2: SpO2: 94 % O2 Device: O2 Device: Room Air O2 Flow Rate: O2 Flow Rate (L/min): (S) 8 L/min(per patients sats when patient was asleep )  Intake/output summary:   Intake/Output Summary (Last 24 hours) at 12/03/2018 1806 Last data filed at 12/02/2018 2213 Gross per 24 hour  Intake --  Output 400 ml  Net -400 ml   LBM: Last BM Date: 11/28/18 Baseline Weight: Weight: 45.3 kg Most recent weight: Weight: 46 kg       Palliative Assessment/Data:    Flowsheet Rows     Most Recent Value  Intake Tab  Referral Department  Critical care  Unit at Time of Referral  ICU  Palliative Care Primary Diagnosis  Cancer  Date Notified  11/17/18  Palliative Care Type  Return patient Palliative Care  Reason for referral  Clarify Goals of Care  Date of Admission  11/16/18  Date first seen by Palliative Care  11/19/18  # of days Palliative referral response time  2 Day(s)  # of days IP prior to Palliative referral  1  Clinical Assessment  Palliative Performance Scale Score  40%  Psychosocial & Spiritual Assessment  Palliative Care Outcomes  Patient/Family meeting held?  Yes  Who was at the meeting?  spouse  Palliative Care Outcomes  Clarified goals of care, Provided psychosocial or spiritual  support      Patient Active Problem List   Diagnosis Date Noted   Tracheostomy status [facility])    Palliative care encounter    Tracheostomy tube present ([place])    Endotracheally intubated    Head and neck cancer ([place])    S/P percutaneous endoscopic gastrostomy (PEG)  tube placement ([place])    Cachexia ([place])    Failure to thrive in adult    Hypotension due to drugs    Acute on chronic respiratory failure with hypoxia ([place]) 11/16/2018   Encounter for imaging study to confirm orogastric (OG) tube placement    History of ETT    Palliative care by specialist    Acute on chronic respiratory failure with hypoxia and hypercapnia ([place]) 10/26/2018   Pressure injury of skin 10/26/2018   Aspiration into airway 10/26/2018   History of head and neck cancer 10/26/2018   Fall at home, initial encounter 09/28/2018   Goals of care, counseling/discussion    DNR (do not resuscitate) discussion    Chest discomfort 05/06/2018   Dysphagia 04/08/2018   Aspiration pneumonia ([place]) 04/04/2018   Acute on chronic respiratory failure ([place]) 03/15/2018   Carditis    Troponin level elevated 03/11/2018   Protein-calorie malnutrition, severe 03/11/2018   Hyponatremia 03/11/2018   Hypophosphatemia 03/11/2018   Multifocal pneumonia 03/10/2018    Palliative Care Assessment & Plan   Patient Profile: 58 y.o. male  with past medical history of esophageal cancer s/p radiation, recurrent aspiration, dysphagia, COPD, and PEG placement admitted on 11/16/2018 with respiratory failure and required intubation. He was extubated 8/27 but required reintubation shortly after. Recently admitted on 8/4 for respiratory failure and required intubation - seen by palliative team during that admission. This admission, imaging reveals LLL opacification. He was required pressors (off as of 8/28 but BP soft). PMT consulted for [place].  Assessment: Patient Active Problem List   Diagnosis Date Noted   Tracheostomy status [facility])    Palliative care encounter    Tracheostomy tube present ([place])    Endotracheally intubated    Head and neck cancer ([place])    S/P percutaneous endoscopic gastrostomy (PEG) tube placement ([place])    Cachexia ([place])    Failure to thrive in adult     Hypotension due to drugs    Acute on chronic respiratory failure with hypoxia ([place]) 11/16/2018   Encounter for imaging study to confirm orogastric (OG) tube placement    History of ETT    Palliative care by specialist    Acute on chronic respiratory failure with hypoxia and hypercapnia ([place]) 10/26/2018   Pressure injury of skin 10/26/2018   Aspiration into airway 10/26/2018   History of head and neck cancer 10/26/2018   Fall at home, initial encounter 09/28/2018   Goals of care, counseling/discussion    DNR (do not resuscitate) discussion    Chest discomfort 05/06/2018   Dysphagia 04/08/2018   Aspiration pneumonia ([place]) 04/04/2018   Acute on chronic respiratory failure ([place]) 03/15/2018   Carditis    Troponin level elevated 03/11/2018   Protein-calorie malnutrition, severe 03/11/2018   Hyponatremia 03/11/2018   Hypophosphatemia 03/11/2018   Multifocal pneumonia 03/10/2018    Recommendations/Plan: -DNR -Ordered liberalization of his diet with the understanding that the goal moving forward is for comfort with transition home with the support of hospice.  He understands that we will not treat for another aspiration pneumonia moving forward, even if he aspirates here in the hospital. - I discussed with dietary and we removed restrictions as  he can have diet for comfort with understanding that he is going to aspirate. -He would like to work to transition home with hospice support.  I placed a referral to care management. -I called his wife afterward per his request and she states that she disagrees with plan to transition home with hospice.  She feels that if he is going to leave the hospital with hospice, he would be better served to transition to residential hospice.  She would like to discuss further with him tonight and I offered to stop by when she comes to the hospital for trach care training tomorrow.  She understands that his diet has been liberalized per his  request with understanding that he is going to aspirate at some point moving forward.  Code Status: DNR  Prognosis:   Poor  Discharge Planning: To Be Determined -he has expressed desire for transition to allow comfort feeds and go home with hospice support.  As he is able to make his own decisions, will continue to work to facilitate this, however, his wife reports concerns with this plan and noted that maybe he would be better served to go to residential hospice.  They are to discuss this evening.  Care plan was discussed with patient, bedside RN  Thank you for allowing the Palliative Medicine Team to assist in the care of this patient.   Time In: 1620 Time Out: 1740 Total Time 80 Prolonged Time Billed yes      Greater than 50%  of this time was spent counseling and coordinating care related to the above assessment and plan.  Micheline Rough, MD  Please contact Palliative Medicine Team phone at [phone] for questions and concerns.

## 2018-12-03 NOTE — Progress Notes (Addendum)
PROGRESS NOTE    Tony Berry  [id number] DOB: 1960-04-30 DOA: 11/16/2018 PCP: Cher Nakai, MD    Brief Narrative:   58 year old gentleman with prior history of head and neck cancer status post radiation therapy, esophageal cancer, recurrent aspiration pneumonia, COPD, hypertension, noncompliant to aspiration precautions presented to ED on 8/25 unresponsive and hypoxemic requiring intubation.  And he was found to have left lower lobe opacification consistent with aspiration pneumonia and was started on broad-spectrum antibiotics.  He was extubated on 8/27  But re intubated and underwent tracheostomy on 8/30.  PT eval recommended SNF.  But patient is adamantly refusing to go to rehab and wants to go home.  Meanwhile social worker case management working to provide home health services on discharge. PCCM changed his trach to cuffless Shiley 4 on 11/30/2018.  Currently waiting for disposition. Meanwhile palliative care consulted for goals of care, discussions going on.  RN reports that pateint tried to take food by mouth, had choking episode and he was put on 8 lit of [place] oxygen and on trach collar. Currently he is on RA.  Educated him this am , that he is NPO and cannot take anything by mouth.  He is non compliant to dietary restrictions by mouth and adamant about going home soon and continues to refuse SNF.     Assessment & Plan:   Principal Problem:   Acute on chronic respiratory failure with hypoxia (HCC) Active Problems:   Protein-calorie malnutrition, severe   Endotracheally intubated   Head and neck cancer (HCC)   S/P percutaneous endoscopic gastrostomy (PEG) tube placement (HCC)   Cachexia (HCC)   Failure to thrive in adult   Hypotension due to drugs   Tracheostomy tube present (HCC)    Acute on chronic respiratory failure with hypoxia status post multiple intubation/extubation followed by trach placement Probably secondary to recurrent aspiration pneumonias as patient is  noncompliant to his diet. He is currently has cuffless #4 and SLP eval requested for [facility] valve use and is currently using it.  Suspect patient will go home and he will start taking oral intake despite n.p.o. status.  And he is at high risk for recurrent aspiration pneumonias, and worsening respiratory status and death. This was discussed in detail with the patient,but he continues to request food via oral despite cautionary advice.  Will request palliative care consult for goals of care .  RN reports that pateint tried to take food by mouth last night, had choking episode and he was put on 8 lit of [place] oxygen and on trach collar. Currently he is on RA. Will get a CXR to rule out aspiration pneumonia   Status post PEG placement/dysphagia with severe protein calorie malnutrition Continue with tube feeds and he is recommended to stay n.p.o. as per SLP.   History of head and neck cancer status post radiation Pain control with oxycodone and Neurontin. No changes in medications.   Anemia of chronic disease Transfuse PRBC to keep hemoglobin greater than 7. H&H stable around 9.2/30.   Anxiety  continue with Klonopin and Seroquel.  Cachexia/failure to thrive Status post PEG placement and on tube feeds. Patient is requesting ice cream via PEG tube.  Nutrition on board and assisting with management.   Stage II sacral pressure injury Wound care consulted and recommendations given.  One episode of hypoglycemia late yesterday evening  Recommended frequent nutritional supplementation to prevent hypoglycemia. Discussed with RN.    DVT prophylaxis: Lovenox Code Status: DNR Family Communication:  None at bedside Disposition Plan: Home health, waiting for home care to be arranged by case management.  Consultants:   PCCM  Palliative care   Procedures: MULTIPLE ETT.  S/p tracheostomy  On 8/30  s/p cuffless shiley 6 on 11/29/2018.    Antimicrobials: completed rocephin for 7 days.     Subjective: RN reports that patient requested scrambled eggs by mouth.  We educated him regarding the NPO status again.  He denies any chest pain or sob.  No nausea or vomiting.   Objective: Vitals:   12/03/18 0437 12/03/18 0600 12/03/18 0717 12/03/18 0723  BP:  120/89 95/82 102/68  Pulse: 81 85 89 89  Resp: (!) 22 20 19 19   Temp:    98.1 F (36.7 C)  TempSrc:    Oral  SpO2: 96% 91% 97% 92%  Weight:      Height:        Intake/Output Summary (Last 24 hours) at 12/03/2018 0840 Last data filed at 12/02/2018 2213 Gross per 24 hour  Intake --  Output 400 ml  Net -400 ml   Filed Weights   11/30/18 0304 12/01/18 0305 12/02/18 0800  Weight: 45.8 kg 46.1 kg 46 kg    Examination:  General exam: Cachectic looking gentleman, s/p trach on RA , has 4 Shiley cuff less, not in any distress.  Respiratory system: coarse breath sounds bilaterally, air entry fair.  Cardiovascular system: s1s2, heard, RRR, no JVD.  Gastrointestinal system: abd is soft NT ND BS+ Central nervous system: alert and oriented and able to answer all questions appropriately.  Extremities: no pedal edema, cyanosis or clubbing.  Skin: stage 1 pressure injuries on the bilateral heels, with intact skin , stage 2 pressure injury over the sacrum.  Psychiatry: mood appropriate.     Data Reviewed: I have personally reviewed following labs and imaging studies  CBC: Recent Labs  Lab 11/27/18 0453  WBC 8.6  HGB 9.2*  HCT 30.0*  MCV 88.2  PLT [id number]   Basic Metabolic Panel: Recent Labs  Lab 11/27/18 0453  NA 136  K 3.9  CL 99  CO2 30  GLUCOSE 122*  BUN 15  CREATININE 0.31*  CALCIUM 8.6*   GFR: Estimated Creatinine Clearance: 66.3 mL/min (A) (by C-G formula based on SCr of 0.31 mg/dL (L)). Liver Function Tests: No results for input(s): AST, ALT, ALKPHOS, BILITOT, PROT, ALBUMIN in the last 168 hours. No results for input(s): LIPASE, AMYLASE in the last 168 hours. No results for input(s): AMMONIA in the  last 168 hours. Coagulation Profile: No results for input(s): INR, PROTIME in the last 168 hours. Cardiac Enzymes: No results for input(s): CKTOTAL, CKMB, CKMBINDEX, TROPONINI in the last 168 hours. BNP (last 3 results) No results for input(s): PROBNP in the last 8760 hours. HbA1C: No results for input(s): HGBA1C in the last 72 hours. CBG: Recent Labs  Lab 11/27/18 0834 11/27/18 1222 11/29/18 1728 11/29/18 1800 12/02/18 1207  GLUCAP 129* 139* 59* 120* 72   Lipid Profile: No results for input(s): CHOL, HDL, LDLCALC, TRIG, CHOLHDL, LDLDIRECT in the last 72 hours. Thyroid Function Tests: No results for input(s): TSH, T4TOTAL, FREET4, T3FREE, THYROIDAB in the last 72 hours. Anemia Panel: No results for input(s): VITAMINB12, FOLATE, FERRITIN, TIBC, IRON, RETICCTPCT in the last 72 hours. Sepsis Labs: No results for input(s): PROCALCITON, LATICACIDVEN in the last 168 hours.  No results found for this or any previous visit (from the past 240 hour(s)).       Radiology Studies: No  results found.      Scheduled Meds:  acetaminophen (TYLENOL) oral liquid 160 mg/5 mL  650 mg Per Tube Q6H   aspirin  81 mg Per Tube Daily   chlorhexidine  15 mL Mouth Rinse BID   enoxaparin (LOVENOX) injection  30 mg Subcutaneous Q24H   feeding supplement (OSMOLITE 1.5 CAL)  356 mL Per Tube QID   free water  120 mL Per Tube QID   gabapentin  300 mg Per Tube Q8H   mouth rinse  15 mL Mouth Rinse q12n4p   oxyCODONE  15 mg Per Tube Q4H   pantoprazole sodium  40 mg Per Tube QHS   QUEtiapine  50 mg Oral QHS   senna-docusate  1 tablet Per Tube Daily   Continuous Infusions:  sodium chloride Stopped (11/20/18 0853)   sodium chloride Stopped (11/22/18 1900)     LOS: 17 days       Hosie Poisson, MD [facility] Pager [phone]   If 7PM-7AM, please contact night-coverage [email or web address] Password [email or web address] 12/03/2018, 8:40 AM

## 2018-12-03 NOTE — Progress Notes (Signed)
Patient refused to have bed alarm and chair alarm on. He was educated that the alarms were used for his safety. He was educated that because of his progressive status he has to be attached to the stepdown monitor for his care. He was educated about his fall risk but continued to refuse alarms. He did report he will  Bed alarm and chair alarm cut off at this time. He says he will call out for help when he needs it.
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
## 2018-12-03 NOTE — Progress Notes (Addendum)
NAME:  Tony Berry, MRN:  [id number], DOB:  1960/10/13, LOS: 73 ADMISSION DATE:  11/16/2018, CONSULTATION DATE:  8/25 REFERRING MD:  [facility], CHIEF COMPLAINT:  Dypsea   Brief History   58 year old male with head and neck cancer s/p radiation therapy complicated by dysphagia and recurrent aspiration. [name]25 from [facility] ED where he required intubation in the setting of AMS and hypoxemic respiratory failure. Extubated 8/27 and shortly thereafter required re-intubation.    Past Medical History   has a past medical history of Esophageal cancer ([place]), Head and neck cancer ([place]), and Hypertension.  Significant Hospital Events   8/25 admitted 8/27, extubated then re-intubated  Consults:  8/26 Palliative Care  Procedures:  ETT multiple times Trach 8/30  Significant Diagnostic Tests:    Micro Data:  COVID [facility]) 8/25 >> NEG MRSA screen 8/25>>neg Respiratory Culture 8/26>>serratia  Antimicrobials:  Ceftriaxone 8/26 x 7 days  Interim history/subjective:  Trach change to 4 shiley cuffless on 9/8.Utilizing PMSV with improved phonation but continued dysphonia per SLP with unclear baseline speech quality.  RN reports that patient tried to take food by mouth, had choking episode and he was put on 8L of [place] oxygen and on trach collar overnight. Currently he is on RA. He wants to eat and continues to eat despite recommendations to remain NPO and TF only. He voices understanding and acceptance of aspiration risks and that he can ultimately die as a result of ongoing aspiration.  Objective   Blood pressure 102/68, pulse 89, temperature 98.1 F (36.7 C), temperature source Oral, resp. rate 19, height 5\' 6"  (1.676 m), weight 46 kg, SpO2 92 %.    FiO2 (%):  [28 %-35 %] 35 %   Intake/Output Summary (Last 24 hours) at 12/03/2018 0943 Last data filed at 12/02/2018 2213 Gross per 24 hour  Intake -  Output 400 ml  Net -400 ml   Filed Weights   11/30/18 0304  12/01/18 0305 12/02/18 0800  Weight: 45.8 kg 46.1 kg 46 kg   General: cachectic, appears much older that chronological age  HENT: Temporal wasting, PERRL. Moist mucus membranes Neck: Trach site CDI, no drainage. PMSV in place.  CV: RRR. S1S2. No MRG. +2 distal pulses Lungs: BBS coarse, FNL, symmetrical ABD: concave GU: No Foley EXT: MAE well. No edema Skin: PWD.  Neuro: A&Ox3. CN II-XII in tact. No focal deficits     Resolved Hospital Problem list   Encephalopathy Shock  Assessment & Plan:   Acute on chronic hypoxemic/hypercabric respiratory failure likely secondary to acute serratia pneumonia on top of chronic aspiration syndrome-in pt w/ head and neck cancer. Protein calorie malnutrition Probable COPD Chronic pain  Discussion -doing well with current 4 cuffless trach with improved phonation utilizing PMSV. Continues to aspirate per SLP.  -considering options of going home with hospice vs going to facility. He is leaning toward hospice as he wants to eat and see his grandchild. He is accepting of risks associated with aspiration if he chooses to eat.  Plan Continue routine tracheostomy care Feeding per [place] as established with pt and palliative care Will need outpatient trach clinic follow up. Please call [facility] office to schedule once clear disposition is established.     PCCM will sign off. Thank you for the opportunity to participate in this patient's care. Please contact if we can be of further assistance.  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  Overnight, no new complaints.  On exam, size 4 cuffless shiley in place with PMV on board with decreased BS diffusely.  I reviewed CXR myself, trach is ok and chronic changes noted.  Discussed with PCCM-NP.  Trach status:  - Maintain 4 cuffless  - No decannulation  - Need f/u with trach clinic upon discharge   Hypoxemia:  - Titrate O2 for sat of 88-92%  - TC  Laryngeal cancer:  - Palliation  PCCM will sign off, please call back if needed.  Patient seen and examined, agree with above note.  I dictated the care and orders written for this patient under my direction.  Rush Farmer, [place]

## 2018-12-04 MED ORDER — JEVITY 1.5 CAL/FIBER PO LIQD
356.0000 mL | Freq: Four times a day (QID) | ORAL | Status: DC
  Administered 2018-12-04 – 2018-12-07 (×10): 356 mL
  Filled 2018-12-04 (×16): qty 474

## 2018-12-04 NOTE — Progress Notes (Addendum)
PROGRESS NOTE    Tony Berry  [id number] DOB: 1960-05-23 DOA: 11/16/2018 PCP: Cher Nakai, MD    Brief Narrative:   58 year old gentleman with prior history of head and neck cancer status post radiation therapy, esophageal cancer, recurrent aspiration pneumonia, COPD, hypertension, noncompliant to aspiration precautions presented to ED on 8/25 unresponsive and hypoxemic requiring intubation.  And he was found to have left lower lobe opacification consistent with aspiration pneumonia and was started on broad-spectrum antibiotics.  He was extubated on 8/27  But re intubated and underwent tracheostomy on 8/30.  PT eval recommended SNF.  But patient is adamantly refusing to go to rehab and wants to go home.  Meanwhile social worker case management working to provide home health services on discharge. PCCM changed his trach to cuffless Shiley 4 on 11/30/2018.  Currently waiting for disposition. Meanwhile palliative care consulted for goals of care, discussions going on.  RN reports that pateint tried to take food by mouth, had choking episode and he was put on 8 lit of [place] oxygen and on trach collar. Currently he is on RA.  Educated him this am , that he is NPO and cannot take anything by mouth.  He is non compliant to dietary restrictions by mouth and adamant about going home soon and continues to refuse SNF.  He had [place] meeting with Dr Domingo Cocking and transitioned to hospice and wants to go ahead and eat without any restrictions and understands the risk of aspiration, leading to aspiration pneumonia and death. He understands that and he does not wish to have any antibiotics if he ends up having aspiration pneumonia.     Assessment & Plan:   Principal Problem:   Acute on chronic respiratory failure with hypoxia (HCC) Active Problems:   Protein-calorie malnutrition, severe   Endotracheally intubated   Head and neck cancer (HCC)   S/P percutaneous endoscopic gastrostomy (PEG) tube placement  (HCC)   Cachexia (HCC)   Failure to thrive in adult   Hypotension due to drugs   Tracheostomy tube present ([place])   Tracheostomy status ([place])   Palliative care encounter    Acute on chronic respiratory failure with hypoxia status post multiple intubation/extubation followed by trach placement Probably secondary to recurrent aspiration pneumonias as patient is noncompliant to his diet. He is currently has cuffless #4 ,  SLP eval requested for [facility] valve use and is currently using it.  Suspect patient will go home and he will start taking oral intake despite n.p.o. status.  And he is at high risk for recurrent aspiration pneumonias, and worsening respiratory status and death. This was discussed in detail with the patient,but he continues to request food via oral despite cautionary advice. Hence requested palliative care consult for goals of care and he was transitioned to hospice. He also  wants to go ahead and eat without any restrictions and understands the risk of aspiration, leading to aspiration pneumonia and death. He understands that and he does not wish to have any antibiotics if he ends up having aspiration pneumonia.   Status post PEG placement/dysphagia with severe protein calorie malnutrition Continue with tube feeds as per patient's request.    History of head and neck cancer status post radiation Pain control with oxycodone and Neurontin. No changes in medications.   Anemia of chronic disease Transfuse PRBC to keep hemoglobin greater than 7. H&H stable around 9.2/30. he is refusing any blood draws at this time.    Anxiety  continue with Klonopin and  Seroquel.   Cachexia/failure to thrive Status post PEG placement and on tube feeds and oral intake. .  Nutrition on board and assisting with management.   Stage II sacral pressure injury Wound care consulted and recommendations given.     DVT prophylaxis: Lovenox Code Status: DNR Family Communication: None at  bedside Disposition Plan: home with hospice when appropriate. Wife wants him to go to residential hospice and he does not want to , they have another meeting later this afternoon.   Consultants:   PCCM  Palliative care   Procedures: MULTIPLE ETT.  S/p tracheostomy  On 8/30  s/p cuffless shiley  on 11/29/2018.    Antimicrobials: completed rocephin for 7 days.   Subjective: No chest pain or sob. Wants to go home with hospice.  Objective: Vitals:   12/03/18 2338 12/04/18 0446 12/04/18 0741 12/04/18 0756  BP: 101/67 98/79  109/77  Pulse: 69 78 84 96  Resp: _0 Temp: 97.7 F (36.5 C) 97.6 F (36.4 C)  98.4 F (36.9 C)  TempSrc: Oral Axillary  Oral  SpO2: 98% 95% 95% 92%  Weight:      Height:        Intake/Output Summary (Last 24 hours) at 12/04/2018 1114 Last data filed at 12/04/2018 0142 Gross per 24 hour  Intake -  Output 1200 ml  Net -1200 ml   Filed Weights   11/30/18 0304 12/01/18 0305 12/02/18 0800  Weight: 45.8 kg 46.1 kg 46 kg    Examination:  General exam: cachetic looking gentleman, s/p trach on RA, not in distress.  Respiratory system: coarse breath sounds bilaterally, diminished at bases, no wheezing heard.  Cardiovascular system: S1S2 HEARD, RRR, no pedal edema.  Gastrointestinal system: abdomen is soft, non tender non distended bowel sounds good.  Central nervous system: alert and oriented  Extremities: no pedal edema.  Skin:  Stage 1 pressure ulcer on the bilateral heels, stage 2 pressure injury over the sacrum.  Psychiatry: mood is appropriate.    Data Reviewed: I have personally reviewed following labs and imaging studies  CBC: No results for input(s): WBC, NEUTROABS, HGB, HCT, MCV, PLT in the last 168 hours. Basic Metabolic Panel: No results for input(s): NA, K, CL, CO2, GLUCOSE, BUN, CREATININE, CALCIUM, MG, PHOS in the last 168 hours. GFR: Estimated Creatinine Clearance: 66.3 mL/min (A) (by C-G formula based on SCr of 0.31 mg/dL  (L)). Liver Function Tests: No results for input(s): AST, ALT, ALKPHOS, BILITOT, PROT, ALBUMIN in the last 168 hours. No results for input(s): LIPASE, AMYLASE in the last 168 hours. No results for input(s): AMMONIA in the last 168 hours. Coagulation Profile: No results for input(s): INR, PROTIME in the last 168 hours. Cardiac Enzymes: No results for input(s): CKTOTAL, CKMB, CKMBINDEX, TROPONINI in the last 168 hours. BNP (last 3 results) No results for input(s): PROBNP in the last 8760 hours. HbA1C: No results for input(s): HGBA1C in the last 72 hours. CBG: Recent Labs  Lab 11/27/18 1222 11/29/18 1728 11/29/18 1800 12/02/18 1207  GLUCAP 139* 59* 120* 72   Lipid Profile: No results for input(s): CHOL, HDL, LDLCALC, TRIG, CHOLHDL, LDLDIRECT in the last 72 hours. Thyroid Function Tests: No results for input(s): TSH, T4TOTAL, FREET4, T3FREE, THYROIDAB in the last 72 hours. Anemia Panel: No results for input(s): VITAMINB12, FOLATE, FERRITIN, TIBC, IRON, RETICCTPCT in the last 72 hours. Sepsis Labs: No results for input(s): PROCALCITON, LATICACIDVEN in the last 168 hours.  No results found for this or any previous  visit (from the past 240 hour(s)).       Radiology Studies: Dg Chest 2 View  Result Date: 12/03/2018 CLINICAL DATA:  Choking, tracheostomy, recurrent aspiration EXAM: CHEST - 2 VIEW COMPARISON:  11/21/2018 FINDINGS: The heart size and mediastinal contours are within normal limits. No significant change heterogeneous, somewhat nodular airspace opacity of the left mid lung and right lung base, generally in keeping with nodular airspace disease seen on CT dated 09/18/2018. Tracheostomy. Disc degenerative disease of the thoracic spine. IMPRESSION: No significant change heterogeneous, somewhat nodular airspace opacity of the left mid lung and right lung base, generally in keeping with nodular airspace disease seen on CT dated 09/18/2018. Tracheostomy. Electronically Signed    By: Eddie Candle M.D.   On: 12/03/2018 11:17        Scheduled Meds: . acetaminophen (TYLENOL) oral liquid 160 mg/5 mL  650 mg Per Tube Q6H  . aspirin  81 mg Per Tube Daily  . chlorhexidine  15 mL Mouth Rinse BID  . enoxaparin (LOVENOX) injection  30 mg Subcutaneous Q24H  . feeding supplement (ENSURE ENLIVE)  237 mL Oral TID BM  . feeding supplement (OSMOLITE 1.5 CAL)  356 mL Per Tube QID  . free water  120 mL Per Tube QID  . gabapentin  300 mg Per Tube Q8H  . mouth rinse  15 mL Mouth Rinse q12n4p  . oxyCODONE  20 mg Per Tube Q4H  . pantoprazole sodium  40 mg Per Tube QHS  . QUEtiapine  50 mg Oral QHS  . senna-docusate  1 tablet Per Tube Daily   Continuous Infusions: . sodium chloride Stopped (11/20/18 0853)  . sodium chloride Stopped (11/22/18 1900)     LOS: 18 days       Hosie Poisson, MD [facility] Pager [phone]   If 7PM-7AM, please contact night-coverage [email or web address] Password [facility] 12/04/2018, 11:14 AM    Addendum:  Dr Domingo Cocking  Met with the patient and family and they have decided to go home with home health and remain NPO and transition to tube feeds for nutrition.   Hosie Poisson, MD [phone]

## 2018-12-04 NOTE — Progress Notes (Signed)
Nutrition Follow-up / Consult RD working remotely.  DOCUMENTATION CODES:   Severe malnutrition in context of chronic illness, Underweight  INTERVENTION:    Change TF formula to Jevity 1.5, give 356 ml (1.5 cans) QID.   Continue 60 ml free water flush before and after each TF bolus.   Provides 2130 kcal, 91 gm protein, 32 gm fiber, 1080 ml free water daily.  NUTRITION DIAGNOSIS:   Severe Malnutrition related to chronic illness(head and neck cancer with dysphagia and aspiration, COPD) as evidenced by severe fat depletion, severe muscle depletion.  Ongoing   GOAL:   Patient will meet greater than or equal to 90% of their needs  Met with TF  MONITOR:   TF tolerance, Vent status, Labs, Weight trends, Skin  ASSESSMENT:   58 yo male admitted with acute on chronic respiratory failure secondary to aspiration requiring intubation, septic shock. PMH includes head and neck cancer with dysphagia and aspriation s/p PEG tube, COPD, malnutrition  Patient decided on comfort care yesterday, but changed his mind and does not want comfort care any more. Discussed nutrition care with Dr. Domingo Cocking of Palliative Medicine. Currently receiving Osmolite 1.5 356 ml QID via PEG. Patient reports that he was on Jevity TF PTA and had less GI distress with Jevity than he is currently having with Osmolite TF. RD to change TF order to Jevity 1.5 boluses.  No recent labs. Medications reviewed. No weight available since 9/10.   Diet Order:   Diet Order            Diet NPO time specified Except for: Ice Chips  Diet effective now              EDUCATION NEEDS:   Not appropriate for education at this time  Skin:  Skin Integrity Issues:: Stage II, Stage I Stage I: b/l heels; iliac crest Stage II: sacrum  Last BM:  9/12  Height:   Ht Readings from Last 1 Encounters:  11/16/18 '5\' 6"'  (1.676 m)    Weight:   Wt Readings from Last 1 Encounters:  12/02/18 46 kg    Ideal Body Weight:  67.3  kg  BMI:  Body mass index is 16.37 kg/m.  Estimated Nutritional Needs:   Kcal:  1800-2000  Protein:  90-105 grams  Fluid:  > 1.8 L    Molli Barrows, RD, LDN, CNSC Pager [phone] After Hours Pager [phone]

## 2018-12-04 NOTE — TOC Progression Note (Signed)
Transition of Care [facility]) - Progression Note    Patient Details  Name: Tony Berry MRN: [id number] Date of Birth: 03-03-1961  Transition of [facility]) CM/SW [place], [place] Phone Number: 12/04/2018, 8:17 AM  Clinical Narrative:   CSW following for discharge plan. CSW discussed case with PMT MD who was planning to see patient. CSW provided update on latest status of patient's discharge plan (wife and son coming tomorrow for trach training, patient continuing to want to eat, housing barriers to getting the patient home) and MD planning to go and see patient to determine his goals moving forward. Plans may be changing to home with hospice, pending patient's wishes. CSW to continue to follow.    Expected Discharge Plan: Long Term Nursing Home Barriers to Discharge: Continued Medical Work up  Expected Discharge Plan and Services Expected Discharge Plan: [place]   Discharge Planning Services: CM Consult   Living arrangements for the past 2 months: Apartment                                       Social Determinants of Health (SDOH) Interventions    Readmission Risk Interventions Readmission Risk Prevention Plan 10/01/2018  Transportation Screening Complete  PCP or Specialist Appt within 3-5 Days Complete  HRI or [place] Complete  Social Work Consult for [place] Planning/Counseling Complete  Palliative Care Screening Complete  Medication Review [employer]) Complete  Some recent data might be hidden

## 2018-12-04 NOTE — Progress Notes (Signed)
Daily Progress Note   Patient Name: Tony Berry       Date: 12/04/2018 DOB: [birth year]  Age: 58 y.o. MRN#: [id number] Attending Physician: Hosie Poisson, MD Primary Care Physician: Cher Nakai, MD Admit Date: 11/16/2018  Reason for Consultation/Follow-up: Establishing goals of care  Subjective: I saw and examined Tony Berry this afternoon.  Sitting in bed with trach collar.  Wife and son in room.  We discussed his clinical course as well as options for care moving forward.   See below.  Length of Stay: 18  Current Medications: Scheduled Meds:  . acetaminophen (TYLENOL) oral liquid 160 mg/5 mL  650 mg Per Tube Q6H  . aspirin  81 mg Per Tube Daily  . chlorhexidine  15 mL Mouth Rinse BID  . enoxaparin (LOVENOX) injection  30 mg Subcutaneous Q24H  . feeding supplement (JEVITY 1.5 CAL/FIBER)  356 mL Per Tube QID  . free water  120 mL Per Tube QID  . gabapentin  300 mg Per Tube Q8H  . mouth rinse  15 mL Mouth Rinse q12n4p  . oxyCODONE  20 mg Per Tube Q4H  . pantoprazole sodium  40 mg Per Tube QHS  . QUEtiapine  50 mg Oral QHS  . senna-docusate  1 tablet Per Tube Daily    Continuous Infusions: . sodium chloride Stopped (11/20/18 0853)  . sodium chloride Stopped (11/22/18 1900)    PRN Meds: sodium chloride, sodium chloride, docusate, guaiFENesin, HYDROmorphone (DILAUDID) injection, ipratropium-albuterol, LORazepam, ondansetron (ZOFRAN) IV, oxyCODONE, Resource ThickenUp Clear  Physical Exam         General: Awake and alert. Trach collar and PMV in place Heart: Regular rate and rhythm. No murmur appreciated. Lungs: Good air movement Abdomen: Soft, nontender, nondistended, positive bowel sounds.  Ext: No significant edema Skin: Warm and dry  Vital Signs: BP 110/73 (BP  Location: Right Arm)   Pulse 85   Temp 98.3 F (36.8 C) (Oral)   Resp 14   Ht 5\' 6"  (1.676 m)   Wt 46 kg   SpO2 94%   BMI 16.37 kg/m  SpO2: SpO2: 94 % O2 Device: O2 Device: Room Air O2 Flow Rate: O2 Flow Rate (L/min): (S) 8 L/min(per patients sats when patient was asleep )  Intake/output summary:   Intake/Output Summary (Last 24 hours) at 12/04/2018 2107 Last  data filed at 12/04/2018 0142 Gross per 24 hour  Intake -  Output 1200 ml  Net -1200 ml   LBM: Last BM Date: 12/04/18 Baseline Weight: Weight: 45.3 kg Most recent weight: Weight: 46 kg       Palliative Assessment/Data:    Flowsheet Rows     Most Recent Value  Intake Tab  Referral Department  Critical care  Unit at Time of Referral  ICU  Palliative Care Primary Diagnosis  Cancer  Date Notified  11/17/18  Palliative Care Type  Return patient Palliative Care  Reason for referral  Clarify Goals of Care  Date of Admission  11/16/18  Date first seen by Palliative Care  11/19/18  # of days Palliative referral response time  2 Day(s)  # of days IP prior to Palliative referral  1  Clinical Assessment  Palliative Performance Scale Score  40%  Psychosocial & Spiritual Assessment  Palliative Care Outcomes  Patient/Family meeting held?  Yes  Who was at the meeting?  spouse  Palliative Care Outcomes  Clarified goals of care, Provided psychosocial or spiritual support      Patient Active Problem List   Diagnosis Date Noted  . Tracheostomy status ([place])   . Palliative care encounter   . Tracheostomy tube present ([place])   . Endotracheally intubated   . Head and neck cancer ([place])   . S/P percutaneous endoscopic gastrostomy (PEG) tube placement ([place])   . Cachexia ([place])   . Failure to thrive in adult   . Hypotension due to drugs   . Acute on chronic respiratory failure with hypoxia ([place]) 11/16/2018  . Encounter for imaging study to confirm orogastric (OG) tube placement   . History of ETT   . Palliative care by  specialist   . Acute on chronic respiratory failure with hypoxia and hypercapnia ([place]) 10/26/2018  . Pressure injury of skin 10/26/2018  . Aspiration into airway 10/26/2018  . History of head and neck cancer 10/26/2018  . Fall at home, initial encounter 09/28/2018  . Goals of care, counseling/discussion   . DNR (do not resuscitate) discussion   . Chest discomfort 05/06/2018  . Dysphagia 04/08/2018  . Aspiration pneumonia ([place]) 04/04/2018  . Acute on chronic respiratory failure ([place]) 03/15/2018  . Carditis   . Troponin level elevated 03/11/2018  . Protein-calorie malnutrition, severe 03/11/2018  . Hyponatremia 03/11/2018  . Hypophosphatemia 03/11/2018  . Multifocal pneumonia 03/10/2018    Palliative Care Assessment & Plan   Patient Profile: 58 y.o. male  with past medical history of esophageal cancer s/p radiation, recurrent aspiration, dysphagia, COPD, and PEG placement admitted on 11/16/2018 with respiratory failure and required intubation. He was extubated 8/27 but required reintubation shortly after. Recently admitted on 8/4 for respiratory failure and required intubation - seen by palliative team during that admission. This admission, imaging reveals LLL opacification. He was required pressors (off as of 8/28 but BP soft). PMT consulted for [place].  Assessment: Patient Active Problem List   Diagnosis Date Noted  . Tracheostomy status ([place])   . Palliative care encounter   . Tracheostomy tube present ([place])   . Endotracheally intubated   . Head and neck cancer ([place])   . S/P percutaneous endoscopic gastrostomy (PEG) tube placement ([place])   . Cachexia ([place])   . Failure to thrive in adult   . Hypotension due to drugs   . Acute on chronic respiratory failure with hypoxia ([place]) 11/16/2018  . Encounter for imaging study to confirm orogastric (OG) tube  placement   . History of ETT   . Palliative care by specialist   . Acute on chronic respiratory failure with hypoxia and hypercapnia  ([place]) 10/26/2018  . Pressure injury of skin 10/26/2018  . Aspiration into airway 10/26/2018  . History of head and neck cancer 10/26/2018  . Fall at home, initial encounter 09/28/2018  . Goals of care, counseling/discussion   . DNR (do not resuscitate) discussion   . Chest discomfort 05/06/2018  . Dysphagia 04/08/2018  . Aspiration pneumonia ([place]) 04/04/2018  . Acute on chronic respiratory failure ([place]) 03/15/2018  . Carditis   . Troponin level elevated 03/11/2018  . Protein-calorie malnutrition, severe 03/11/2018  . Hyponatremia 03/11/2018  . Hypophosphatemia 03/11/2018  . Multifocal pneumonia 03/10/2018    Recommendations/Plan: -DNR - He reports that he gets sick to his stomach with osmolite.  Requests transition to [place].  I called and discussed with dietary.  Appreciate dietary assistance greatly. - Long discussion with patient and his family regarding options moving forward.  His wife is not in agreement with plan to transition home with hospice.  His wife wants him to work to get home with home health for one month trial and then reassess his quality of life.  If he does not find quality of life acceptable, she would be in agreement with plan to remain home with hospice support.  While he still desires to take in PO intake, Tony Berry reports understanding that he cannot take in PO if his goal is to return home with home health rather than hospice support.  He is in agreement with plan to return to NPO except ice chips and work to get home with home health as they have been arranging previously.  Code Status: DNR  Prognosis:   Poor  Discharge Planning: To Be Determined - After discussion with his family, Tony Berry now requests to transition back to plan for home with home health once arrangements can be made.  Care plan was discussed with patient, bedside RN  Thank you for allowing the Palliative Medicine Team to assist in the care of this patient.   Time In: 1240 Time  Out: 1350 Total Time 70 Prolonged Time Billed yes      Greater than 50%  of this time was spent counseling and coordinating care related to the above assessment and plan.  Micheline Rough, MD  Please contact Palliative Medicine Team phone at [phone] for questions and concerns.

## 2018-12-04 NOTE — Plan of Care (Signed)
  Problem: Clinical Measurements: Goal: Respiratory complications will improve Outcome: Progressing   Problem: Activity: Goal: Risk for activity intolerance will decrease Outcome: Progressing   Problem: Respiratory: Goal: Ability to maintain a clear airway and adequate ventilation will improve Outcome: Progressing   Problem: Education: Goal: Knowledge of General Education information will improve Description: Including pain rating scale, medication(s)/side effects and non-pharmacologic comfort measures Outcome: Not Progressing   Problem: Health Behavior/Discharge Planning: Goal: Ability to manage health-related needs will improve Outcome: Not Progressing

## 2018-12-05 NOTE — Progress Notes (Signed)
Pt only tolerated 100 mL of scheduled tube feed. Pt had some ice cream prior to feeding.   12/05/18 11:07 PM Undrea Archbold Assunta Gambles, RN

## 2018-12-05 NOTE — Progress Notes (Signed)
PROGRESS NOTE    Tony Berry  [id number] DOB: Sep 13, 1960 DOA: 11/16/2018 PCP: Cher Nakai, MD    Brief Narrative:   58 year old gentleman with prior history of head and neck cancer status post radiation therapy, esophageal cancer, recurrent aspiration pneumonia, COPD, hypertension, noncompliant to aspiration precautions presented to ED on 8/25 unresponsive and hypoxemic requiring intubation.  And he was found to have left lower lobe opacification consistent with aspiration pneumonia and was started on broad-spectrum antibiotics.  He was extubated on 8/27  But re intubated and underwent tracheostomy on 8/30.  PT eval recommended SNF.  But patient is adamantly refusing to go to rehab and wants to go home.  Meanwhile social worker case management working to provide home health services on discharge. PCCM changed his trach to cuffless Shiley 4 on 11/30/2018.  Currently waiting for disposition. Meanwhile palliative care consulted for goals of care, discussions going on.  RN reports that pateint tried to take food by mouth, had choking episode and he was put on 8 lit of  oxygen and on trach collar. Currently he is on RA.  Educated him this am , that he is NPO and cannot take anything by mouth.  He is non compliant to dietary restrictions by mouth and adamant about going home soon and continues to refuse SNF.  He had [place] meeting with Dr Domingo Cocking and initially transitioned to hospice and wants to go ahead and eat without any restrictions and understands the risk of aspiration, leading to aspiration pneumonia and death. He had another meeting on 12/04/2018 with family and Dr Domingo Cocking and changed his mind and wanted to go home with home health, instead of home hospice. He will remain NPO and take only nutrition via the PEG tube.    Pt seen and examined today and denies any new complaints.   Assessment & Plan:   Principal Problem:   Acute on chronic respiratory failure with hypoxia (HCC) Active  Problems:   Protein-calorie malnutrition, severe   Endotracheally intubated   Head and neck cancer (HCC)   S/P percutaneous endoscopic gastrostomy (PEG) tube placement (HCC)   Cachexia (HCC)   Failure to thrive in adult   Hypotension due to drugs   Tracheostomy tube present ([place])   Tracheostomy status ([place])   Palliative care encounter    Acute on chronic respiratory failure with hypoxia status post multiple intubation/extubation followed by trach placement Probably secondary to recurrent aspiration pneumonias as patient is noncompliant to his diet. He is currently has cuffless #4 ,  SLP eval requested for [facility] valve use and is currently using it.  Suspect patient will go home and he will start taking oral intake despite n.p.o. status.  And he is at high risk for recurrent aspiration pneumonias, and worsening respiratory status and death. This was discussed in detail with the patient,but he continues to request food via oral despite cautionary advice.    He had [place] meeting with Dr Domingo Cocking and initially transitioned to hospice and wants to go ahead and eat without any restrictions and understands the risk of aspiration, leading to aspiration pneumonia and death. He had another meeting on 12/04/2018 with family and Dr Domingo Cocking and changed his mind and wanted to go home with home health, instead of home hospice. He will remain NPO and take only nutrition via the PEG tube.    Status post PEG placement/dysphagia with severe protein calorie malnutrition Continue with tube feeds as per patient's request.    History of head and  neck cancer status post radiation Pain control with oxycodone and Neurontin. No changes in medications.   Anemia of chronic disease Transfuse PRBC to keep hemoglobin greater than 7. H&H stable around 9.2/30. he is refusing any blood draws at this time.    Anxiety  continue with Klonopin and Seroquel.   Cachexia/failure to thrive Status post PEG placement  and on tube feeds and oral intake. .  Nutrition on board and assisting with management.   Stage II sacral pressure injury Wound care consulted and recommendations given.     DVT prophylaxis: Lovenox Code Status: DNR Family Communication: None at bedside Disposition Plan: home with home health when arranged.   Consultants:   PCCM  Palliative care   Procedures: MULTIPLE ETT.  S/p tracheostomy  On 8/30  s/p cuffless shiley  on 11/29/2018.    Antimicrobials: completed rocephin for 7 days.   Subjective: No complaints. Looking for ward to going home.  Objective: Vitals:   12/05/18 1250 12/05/18 1350 12/05/18 1504 12/05/18 1508  BP:   109/60   Pulse: 85 95 (!) 107 (!) 105  Resp:  18 18 (!) 22  Temp:      TempSrc:      SpO2: (!) 88% 95% 90% 94%  Weight:      Height:        Intake/Output Summary (Last 24 hours) at 12/05/2018 1543 Last data filed at 12/05/2018 0200 Gross per 24 hour  Intake -  Output 800 ml  Net -800 ml   Filed Weights   11/30/18 0304 12/01/18 0305 12/02/18 0800  Weight: 45.8 kg 46.1 kg 46 kg    Examination:  General exam: Cachectic looking gentleman status post trach on room air, not in any kind of distress Respiratory system: Coarse breath sounds bilaterally and diminished, no wheezing or rhonchi Cardiovascular system: S1-S2 heard, regular rate rhythm, no pedal edema Gastrointestinal system: Abdomen is soft, nontender, nondistended with good bowel sounds Central nervous system: Alert and oriented and grossly nonfocal Extremities: Pedal edema Skin: Stage I pressure ulcer on bilateral heels stage II pressure injury over the sacrum.  Bandaged Psychiatry: Mood is appropriate   Data Reviewed: I have personally reviewed following labs and imaging studies  CBC: No results for input(s): WBC, NEUTROABS, HGB, HCT, MCV, PLT in the last 168 hours. Basic Metabolic Panel: No results for input(s): NA, K, CL, CO2, GLUCOSE, BUN, CREATININE, CALCIUM, MG,  PHOS in the last 168 hours. GFR: Estimated Creatinine Clearance: 66.3 mL/min (A) (by C-G formula based on SCr of 0.31 mg/dL (L)). Liver Function Tests: No results for input(s): AST, ALT, ALKPHOS, BILITOT, PROT, ALBUMIN in the last 168 hours. No results for input(s): LIPASE, AMYLASE in the last 168 hours. No results for input(s): AMMONIA in the last 168 hours. Coagulation Profile: No results for input(s): INR, PROTIME in the last 168 hours. Cardiac Enzymes: No results for input(s): CKTOTAL, CKMB, CKMBINDEX, TROPONINI in the last 168 hours. BNP (last 3 results) No results for input(s): PROBNP in the last 8760 hours. HbA1C: No results for input(s): HGBA1C in the last 72 hours. CBG: Recent Labs  Lab 11/29/18 1728 11/29/18 1800 12/02/18 1207  GLUCAP 59* 120* 72   Lipid Profile: No results for input(s): CHOL, HDL, LDLCALC, TRIG, CHOLHDL, LDLDIRECT in the last 72 hours. Thyroid Function Tests: No results for input(s): TSH, T4TOTAL, FREET4, T3FREE, THYROIDAB in the last 72 hours. Anemia Panel: No results for input(s): VITAMINB12, FOLATE, FERRITIN, TIBC, IRON, RETICCTPCT in the last 72 hours. Sepsis Labs: No  results for input(s): PROCALCITON, LATICACIDVEN in the last 168 hours.  No results found for this or any previous visit (from the past 240 hour(s)).       Radiology Studies: No results found.      Scheduled Meds: . acetaminophen (TYLENOL) oral liquid 160 mg/5 mL  650 mg Per Tube Q6H  . aspirin  81 mg Per Tube Daily  . chlorhexidine  15 mL Mouth Rinse BID  . enoxaparin (LOVENOX) injection  30 mg Subcutaneous Q24H  . feeding supplement (JEVITY 1.5 CAL/FIBER)  356 mL Per Tube QID  . free water  120 mL Per Tube QID  . gabapentin  300 mg Per Tube Q8H  . mouth rinse  15 mL Mouth Rinse q12n4p  . oxyCODONE  20 mg Per Tube Q4H  . pantoprazole sodium  40 mg Per Tube QHS  . QUEtiapine  50 mg Oral QHS  . senna-docusate  1 tablet Per Tube Daily   Continuous Infusions: .  sodium chloride Stopped (11/20/18 0853)  . sodium chloride Stopped (11/22/18 1900)     LOS: 19 days       Hosie Poisson, MD [facility] Pager [phone]   If 7PM-7AM, please contact night-coverage [email or web address] Password [email or web address] 12/05/2018, 3:43 PM

## 2018-12-05 NOTE — Plan of Care (Signed)
  Problem: Activity: Goal: Risk for activity intolerance will decrease Outcome: Progressing   Problem: Elimination: Goal: Will not experience complications related to bowel motility Outcome: Progressing Goal: Will not experience complications related to urinary retention Outcome: Progressing   Problem: Pain Managment: Goal: General experience of comfort will improve Outcome: Progressing   

## 2018-12-06 ENCOUNTER — Inpatient Hospital Stay (HOSPITAL_COMMUNITY): Payer: Medicaid Other

## 2018-12-06 NOTE — Progress Notes (Signed)
Attempted to see patient, but bathing at that time.  I am going off service, but palliative will continue to follow peripherally.  Please call if there are specific needs with which our team can be of assistance.  Micheline Rough, MD [facility] Team [phone]  NO CHARGE NOTE

## 2018-12-06 NOTE — Progress Notes (Signed)
SLP Cancellation Note  Patient Details Name: Tony Berry MRN: [id number] DOB: [birth year]   Cancelled treatment:        Attempted to see pt and family for PMV education.  Wife not present at time of attempt.  If wife is available this morning, please page SLP.  SLP will reattempt as schedule and family availability permit.   Celedonio Savage, MA, [place] Office: [phone]; Pager [phone]): [phone] 12/06/2018, 10:02 AM

## 2018-12-06 NOTE — Progress Notes (Signed)
PROGRESS NOTE    Tony Berry  [id number] DOB: [birth year] DOA: 11/16/2018 PCP: Cher Nakai, MD    Brief Narrative:   58 year old gentleman with prior history of head and neck cancer status post radiation therapy, esophageal cancer, recurrent aspiration pneumonia, COPD, hypertension, noncompliant to aspiration precautions presented to ED on 8/25 unresponsive and hypoxemic requiring intubation.  And he was found to have left lower lobe opacification consistent with aspiration pneumonia and was started on broad-spectrum antibiotics.  He was extubated on 8/27  But re intubated and underwent tracheostomy on 8/30.  PT eval recommended SNF.  But patient is adamantly refusing to go to rehab and wants to go home.  Meanwhile social worker case management working to provide home health services on discharge. PCCM changed his trach to cuffless Shiley 4 on 11/30/2018.  Currently waiting for disposition. Meanwhile palliative care consulted for goals of care, discussions going on.  RN reports that pateint tried to take food by mouth, had choking episode and he was put on 8 lit of [place] oxygen and on trach collar. Currently he is on RA.  Educated him this am , that he is NPO and cannot take anything by mouth.  He is non compliant to dietary restrictions by mouth and adamant about going home soon and continues to refuse SNF.  He had [place] meeting with Dr Domingo Cocking and initially transitioned to hospice and wants to go ahead and eat without any restrictions and understands the risk of aspiration, leading to aspiration pneumonia and death. He had another meeting on 12/04/2018 with family and Dr Domingo Cocking and changed his mind and wanted to go home with home health, instead of home hospice. He will remain NPO and take only nutrition via the PEG tube.   Patient seen and examined at bedside as per the RN patient required up to 6 L of oxygen overnight.  Patient currently denies any chest pain or shortness of breath and appears  comfortable on 5 L  of oxygen.  Assessment & Plan:   Principal Problem:   Acute on chronic respiratory failure with hypoxia (HCC) Active Problems:   Protein-calorie malnutrition, severe   Endotracheally intubated   Head and neck cancer (HCC)   S/P percutaneous endoscopic gastrostomy (PEG) tube placement (HCC)   Cachexia (HCC)   Failure to thrive in adult   Hypotension due to drugs   Tracheostomy tube present ([place])   Tracheostomy status ([place])   Palliative care encounter    Acute on chronic respiratory failure with hypoxia status post multiple intubation/extubation followed by trach placement Probably secondary to recurrent aspiration pneumonias as patient is noncompliant to his diet. He is currently has cuffless #4 ,  SLP eval requested for [facility] valve use and is currently using it.  Suspect patient will go home and he will start taking oral intake despite n.p.o. status.  And he is at high risk for recurrent aspiration pneumonias, and worsening respiratory status and death. This was discussed in detail with the patient,but he occasionally requesting  food via oral despite cautionary advice.    He had [place] meeting with Dr Domingo Cocking and initially transitioned to hospice and wants to go ahead and eat via oral without any restrictions and understands the risk of aspiration, leading to aspiration pneumonia and death following which he had another meeting on 12/04/2018 with family and Dr Domingo Cocking and changed his mind and wanted to go home with home health, instead of home hospice. He will remain NPO and take only nutrition  via the PEG tube.   Overnight patient required about 6 L of oxygen to keep sats greater than 90%.  Patient denies any oral intake or choking episodes or shortness of breath.  Will get chest x-ray for further evaluation.   Status post PEG placement/dysphagia with severe protein calorie malnutrition Continue with tube feeds as per patient's request.    History of head  and neck cancer status post radiation Pain control with oxycodone and Neurontin. No new changes in medications.  Anemia of chronic disease Transfuse PRBC to keep hemoglobin greater than 7. H&H stable around 9.2/30. he is refusing any blood draws at this time.    Anxiety  continue with Klonopin and Seroquel.   Cachexia/failure to thrive Status post PEG placement and on tube feeds only.  N.p.o. except for ice chips.  Nutrition on board and assisting with management.   Stage II sacral pressure injury Wound care consulted and recommendations given.  Hypotension Patient asymptomatic and unclear etiology.  He does not appear to be toxic.  Repeat blood pressure this morning's were 111/60 mmHg   DVT prophylaxis: Lovenox Code Status: DNR Family Communication: None at bedside Disposition Plan: home with home health when arranged.   Consultants:   PCCM  Palliative care   Procedures: MULTIPLE ETT.  S/p tracheostomy  On 8/30  s/p cuffless shiley  on 11/29/2018.    Antimicrobials: completed rocephin for 7 days.   Subjective: Patient seen and examined at bedside as per the RN patient required up to 6 L of oxygen overnight.  Patient currently denies any chest pain or shortness of breath and appears comfortable on 5 L  of oxygen.  Objective: Vitals:   12/05/18 2341 12/06/18 0356 12/06/18 0720 12/06/18 0941  BP: (!) 93/51 (!) 94/59 (!) 89/64 111/60  Pulse: 93 88 87 82  Resp: (!) 21 14 17 16   Temp: 98.5 F (36.9 C) 98.1 F (36.7 C) 97.6 F (36.4 C)   TempSrc: Oral Oral Oral   SpO2: 90% 93% 94% 93%  Weight:      Height:       No intake or output data in the 24 hours ending 12/06/18 0948 Filed Weights   11/30/18 0304 12/01/18 0305 12/02/18 0800  Weight: 45.8 kg 46.1 kg 46 kg    Examination:  General exam: Cachectic looking gentleman status post trach on 5 L of oxygen, alert and appears comfortable. Respiratory system: Diminished breath sounds at bases no wheezing or  rhonchi air entry fair in the upper lobes Cardiovascular system: S1-S2 heard, regular rate rhythm, no pedal edema Gastrointestinal system: Abdomen is soft, nontender, nondistended with good bowel sounds Central nervous system: Alert and oriented to place person and time.  Grossly nonfocal Extremities: No pedal edema Skin: Stage I pressure ulcer on bilateral heels, stage II pressure injury over the sacrum Psychiatry: Mood is appropriate   Data Reviewed: I have personally reviewed following labs and imaging studies  CBC: No results for input(s): WBC, NEUTROABS, HGB, HCT, MCV, PLT in the last 168 hours. Basic Metabolic Panel: No results for input(s): NA, K, CL, CO2, GLUCOSE, BUN, CREATININE, CALCIUM, MG, PHOS in the last 168 hours. GFR: Estimated Creatinine Clearance: 66.3 mL/min (A) (by C-G formula based on SCr of 0.31 mg/dL (L)). Liver Function Tests: No results for input(s): AST, ALT, ALKPHOS, BILITOT, PROT, ALBUMIN in the last 168 hours. No results for input(s): LIPASE, AMYLASE in the last 168 hours. No results for input(s): AMMONIA in the last 168 hours. Coagulation Profile: No  results for input(s): INR, PROTIME in the last 168 hours. Cardiac Enzymes: No results for input(s): CKTOTAL, CKMB, CKMBINDEX, TROPONINI in the last 168 hours. BNP (last 3 results) No results for input(s): PROBNP in the last 8760 hours. HbA1C: No results for input(s): HGBA1C in the last 72 hours. CBG: Recent Labs  Lab 11/29/18 1728 11/29/18 1800 12/02/18 1207  GLUCAP 59* 120* 72   Lipid Profile: No results for input(s): CHOL, HDL, LDLCALC, TRIG, CHOLHDL, LDLDIRECT in the last 72 hours. Thyroid Function Tests: No results for input(s): TSH, T4TOTAL, FREET4, T3FREE, THYROIDAB in the last 72 hours. Anemia Panel: No results for input(s): VITAMINB12, FOLATE, FERRITIN, TIBC, IRON, RETICCTPCT in the last 72 hours. Sepsis Labs: No results for input(s): PROCALCITON, LATICACIDVEN in the last 168 hours.  No  results found for this or any previous visit (from the past 240 hour(s)).       Radiology Studies: No results found.      Scheduled Meds: . acetaminophen (TYLENOL) oral liquid 160 mg/5 mL  650 mg Per Tube Q6H  . aspirin  81 mg Per Tube Daily  . chlorhexidine  15 mL Mouth Rinse BID  . enoxaparin (LOVENOX) injection  30 mg Subcutaneous Q24H  . feeding supplement (JEVITY 1.5 CAL/FIBER)  356 mL Per Tube QID  . free water  120 mL Per Tube QID  . gabapentin  300 mg Per Tube Q8H  . mouth rinse  15 mL Mouth Rinse q12n4p  . oxyCODONE  20 mg Per Tube Q4H  . pantoprazole sodium  40 mg Per Tube QHS  . QUEtiapine  50 mg Oral QHS  . senna-docusate  1 tablet Per Tube Daily   Continuous Infusions: . sodium chloride Stopped (11/20/18 0853)  . sodium chloride Stopped (11/22/18 1900)     LOS: 20 days       Hosie Poisson, MD [facility] Pager [phone]   If 7PM-7AM, please contact night-coverage [email or web address] Password [facility] 12/06/2018, 9:48 AM

## 2018-12-07 ENCOUNTER — Telehealth: Payer: Self-pay | Admitting: Internal Medicine

## 2018-12-07 MED ORDER — PHENOL 1.4 % MT LIQD
1.0000 | OROMUCOSAL | Status: DC | PRN
  Administered 2018-12-07: 1 via OROMUCOSAL
  Filled 2018-12-07: qty 177

## 2018-12-07 MED ORDER — PRO-STAT SUGAR FREE PO LIQD
60.0000 mL | Freq: Two times a day (BID) | ORAL | Status: DC
  Administered 2018-12-07 – 2018-12-13 (×12): 60 mL
  Filled 2018-12-07 (×12): qty 60

## 2018-12-07 MED ORDER — JEVITY 1.5 CAL/FIBER PO LIQD
356.0000 mL | Freq: Four times a day (QID) | ORAL | Status: DC
  Filled 2018-12-07 (×3): qty 474

## 2018-12-07 MED ORDER — PRO-STAT SUGAR FREE PO LIQD
60.0000 mL | Freq: Two times a day (BID) | ORAL | Status: DC
  Administered 2018-12-07: 60 mL via ORAL
  Filled 2018-12-07: qty 60

## 2018-12-07 MED ORDER — JEVITY 1.5 CAL/FIBER PO LIQD
237.0000 mL | Freq: Four times a day (QID) | ORAL | Status: DC
  Administered 2018-12-07 – 2018-12-13 (×12): 237 mL
  Filled 2018-12-07 (×2): qty 1000
  Filled 2018-12-07 (×2): qty 237
  Filled 2018-12-07 (×3): qty 1000
  Filled 2018-12-07: qty 237
  Filled 2018-12-07: qty 1000
  Filled 2018-12-07: qty 237
  Filled 2018-12-07: qty 1000
  Filled 2018-12-07: qty 237
  Filled 2018-12-07: qty 1000
  Filled 2018-12-07: qty 237
  Filled 2018-12-07 (×4): qty 1000
  Filled 2018-12-07: qty 237
  Filled 2018-12-07 (×2): qty 1000
  Filled 2018-12-07 (×2): qty 237
  Filled 2018-12-07: qty 1000
  Filled 2018-12-07: qty 237
  Filled 2018-12-07 (×2): qty 1000
  Filled 2018-12-07: qty 237

## 2018-12-07 NOTE — Progress Notes (Addendum)
Daily Nursing Note  Received report from [place], [place]. Introduced self to patient who was in no distress at the time of assessment. Noted to be on tracheostomy collar at 5LPM, 28% FiO2.  Tracheostomy care performed. Chronic pain has persisted today requiring oxycodone and dilaudid. (+) Nausea after G-tube feedings, asked nutrition to evaluate. Patient remains to place large quantities of vanilla ice cream through his G-Tube which has been recommended against though he remains non-compliant. Patient mobilized in hall x3. All needs met throughout the day.

## 2018-12-07 NOTE — Progress Notes (Signed)
Physical Therapy Treatment Patient Details Name: Tony Berry MRN: [id number] DOB: January 18, 1961 Today's Date: 12/07/2018    History of Present Illness 58 year old male with PMH head and neck cancer s/p radiation therapy, COPD, and recurrent aspiration. He has PEG in place. He has recent admissions to [facility] and [facility] for recurrent aspiration most recently 8/4 when he required intubation.8/25 he became unresponsive at home and his wife called EMS. Found to haveAcute on chronic hypoxemic/hypercabric respiratory failure likely secondary to chronic aspiration, high dose Methadone and underlying emphysema andAcute encephalopathy secondary to acute on chronic respiratory failure. Intubated 8/25, extubated 8/27 and reintubated same day. Trached 8/30 and now on trach collar    PT Comments    Patient progressing with mobility and now eager to participate.  Seems much more stable for d/c home at this point (he had already refused SNF).  Feel patient appropriate for home with HHPT at d/c. Updated goals this session.  PT to follow.   Follow Up Recommendations  Home health PT;Supervision/Assistance - 24 hour     Equipment Recommendations  None recommended by PT    Recommendations for Other Services       Precautions / Restrictions Precautions Precautions: Fall Precaution Comments: PEG tube baseline; new tracheostomy, PMSV    Mobility  Bed Mobility Overal bed mobility: Modified Independent                Transfers Overall transfer level: Needs assistance Equipment used: None   Sit to Stand: Supervision         General transfer comment: assist for safety with lines  Ambulation/Gait Ambulation/Gait assistance: Supervision;Min guard Gait Distance (Feet): 250 Feet Assistive device: None Gait Pattern/deviations: Step-to pattern;Step-through pattern;Decreased dorsiflexion - right;Decreased stance time - right     General Gait Details: still walks on R toes with  ankle rigidity, some veering but no LOB ambulating without device, minguard about 60-70% of walk for safety/balance   Stairs Stairs: Yes Stairs assistance: Min guard Stair Management: One rail Right;Forwards;Step to pattern;Alternating pattern Number of Stairs: 3 General stair comments: assist for safety   Wheelchair Mobility    Modified Rankin (Stroke Patients Only)       Balance           Standing balance support: No upper extremity supported Standing balance-Leahy Scale: Good Standing balance comment: ambulating without device this session, backing up and turning around with minguard A for safety                            Cognition Arousal/Alertness: Awake/alert Behavior During Therapy: WFL for tasks assessed/performed Overall Cognitive Status: Within Functional Limits for tasks assessed                                 General Comments: baseline non-compliant      Exercises      General Comments General comments (skin integrity, edema, etc.): ambulating on RA throughout without noted SOB also able to talk during ambulation      Pertinent Vitals/Pain Pain Assessment: No/denies pain    Home Living                      Prior Function            PT Goals (current goals can now be found in the care plan section) Acute Rehab PT Goals  Patient Stated Goal: to go home PT Goal Formulation: With patient Time For Goal Achievement: 12/21/18 Potential to Achieve Goals: Good Progress towards PT goals: Progressing toward goals    Frequency    Min 3X/week      PT Plan Discharge plan needs to be updated    Co-evaluation              AM-PAC PT "6 Clicks" Mobility   Outcome Measure  Help needed turning from your back to your side while in a flat bed without using bedrails?: None Help needed moving from lying on your back to sitting on the side of a flat bed without using bedrails?: None Help needed moving to and  from a bed to a chair (including a wheelchair)?: A Little Help needed standing up from a chair using your arms (e.g., wheelchair or bedside chair)?: A Little Help needed to walk in hospital room?: A Little Help needed climbing 3-5 steps with a railing? : A Little 6 Click Score: 20    End of Session   Activity Tolerance: Patient tolerated treatment well Patient left: in bed;with call bell/phone within reach   PT Visit Diagnosis: Other abnormalities of gait and mobility (R26.89);Muscle weakness (generalized) (M62.81);Adult, failure to thrive (R62.7)     Time: [id number] PT Time Calculation (min) (ACUTE ONLY): 11 min  Charges:  $Gait Training: 8-22 mins                     Tony Berry, [place] [phone] 12/07/2018    Reginia Naas 12/07/2018, 1:12 PM

## 2018-12-07 NOTE — Progress Notes (Signed)
Nutrition Follow-up  DOCUMENTATION CODES:   Severe malnutrition in context of chronic illness, Underweight  INTERVENTION:   Adjust tube feeding for less volume: -Jevity 1.5, 237 ml (1 carton) QID via G-tube -60 ml Prostat BID -Continue 60 ml free water flush before and after each TF bolus (120 QID) -Tube feeding regimen provides 1820 kcal, 120g protein and 1200 ml   To meet fluid needs, pt will need additional free water of 200 ml TID (600 ml).  MD has approved ice cream via tube, increases risk of bacterial growth or clogging of tube. Will need diligent flushing before and after administration.  NUTRITION DIAGNOSIS:   Severe Malnutrition related to chronic illness(head and neck cancer with dysphagia and aspiration, COPD) as evidenced by severe fat depletion, severe muscle depletion.  Ongoing.  GOAL:   Patient will meet greater than or equal to 90% of their needs  Meeting with TF.  MONITOR:   TF tolerance, Vent status, Labs, Weight trends, Skin  ASSESSMENT:   58 yo male admitted with acute on chronic respiratory failure secondary to aspiration requiring intubation, septic shock. PMH includes head and neck cancer with dysphagia and aspriation s/p PEG tube, COPD, malnutrition  **[place], reports that pt was having N/V with tube feeding administration. Reports giving patient 1/2 of Jevity 1.5 dose that was ordered for this morning's bolus feed. States pt was not as nauseous following this administration. Pt may not be able to tolerate volume of bolus at this time.   Adjusted tube feeding to 1 carton (229ml)  at each feed QID. Will meet needs with 60 ml Prostat dose BID. This meets pt's estimated needs.  RN also reports MD has approved pt to have ice cream in tube as he is likely to consume ice cream, popsicles, and other liquids PO. Per Palliative care note, pt has been encouraged pt to not have any PO at all. Tube will need diligent care and  flushing if ice cream is administered, typically not recommended d/t risk of clogging and bacterial growth in tube.   Will continue to monitor tolerance.  Admission weight: 99 lbs. Current weight: 111 lbs. UOP 9/14: 675 ml  Medications: IV Zofran Labs reviewed: CBGs: 72-120  Diet Order:   Diet Order            Diet NPO time specified Except for: Ice Chips  Diet effective now              EDUCATION NEEDS:   Not appropriate for education at this time  Skin:  Skin Integrity Issues:: Stage II, Stage I Stage I: b/l heels; iliac crest Stage II: sacrum  Last BM:  9/15  Height:   Ht Readings from Last 1 Encounters:  11/16/18 5\' 6"  (1.676 m)    Weight:   Wt Readings from Last 1 Encounters:  12/07/18 50.7 kg    Ideal Body Weight:  67.3 kg  BMI:  Body mass index is 18.04 kg/m.  Estimated Nutritional Needs:   Kcal:  1800-2000  Protein:  90-105 grams  Fluid:  > 1.8 L  Clayton Bibles, MS, RD, LDN Inpatient Clinical Dietitian Pager: [phone] After Hours Pager: [phone]

## 2018-12-07 NOTE — Progress Notes (Signed)
SLP Cancellation Note  Patient Details Name: Tony Berry MRN: [id number] DOB: Apr 29, 1960   Cancelled treatment:        Attempted to see pt and family for PMV education.  Wife not present at time of attempt.  RN will notify SLP if wife arrives to receive education.  SLP will reattempt as schedule and family availability permit.   Celedonio Savage, MA, [place] Office: [phone]; Pager [phone]): [phone] 12/07/2018, 9:55 AM

## 2018-12-07 NOTE — Progress Notes (Signed)
   Vital Signs MEWS/VS Documentation       12/07/2018 1948 12/07/2018 1958 12/07/2018 1959 12/07/2018 2007   MEWS Score:  0  0  1  2   MEWS Score Color:  Green  Green  Green  Yellow   Resp:  --  --  12  13   Pulse:  --  --  72  71   BP:  --  --  --  (!) 89/65   Temp:  --  --  --  98 F (36.7 C)   O2 Device:  --  --  Tracheostomy Collar  Tracheostomy Collar   O2 Flow Rate (L/min):  --  --  5 L/min  --   FiO2 (%):  --  --  28 %  --   Level of Consciousness:  Alert  Alert  --  --       Pt's BP runs softly. Had just given IV Dilaudid before BP reading. No acute distress. Will continue to monitor.      Dedra Skeens 12/07/2018,8:20 PM

## 2018-12-07 NOTE — Progress Notes (Signed)
PROGRESS NOTE    Tony Berry  [id number] DOB: 06-Aug-1960 DOA: 11/16/2018 PCP: Cher Nakai, MD    Brief Narrative:   58 year old gentleman with prior history of head and neck cancer status post radiation therapy, esophageal cancer, recurrent aspiration pneumonia, COPD, hypertension, noncompliant to aspiration precautions presented to ED on 8/25 unresponsive and hypoxemic requiring intubation.  And he was found to have left lower lobe opacification consistent with aspiration pneumonia and was started on broad-spectrum antibiotics.  He was extubated on 8/27  But re intubated and underwent tracheostomy on 8/30.  PT eval recommended SNF.  But patient is adamantly refusing to go to rehab and wants to go home.  Meanwhile Education officer, museum and case management working to provide home health services on discharge. PCCM changed his trach to cuffless Shiley 4 on 11/30/2018.  Currently waiting for disposition. Meanwhile palliative care consulted for goals of care, as patient tries to take food via oral route. He had [place] meeting with Dr Domingo Cocking and initially wanted to go home with hospice and wants to go ahead and eat without any restrictions and understands the risk of aspiration, leading to aspiration pneumonia and death.   The next day he had another meeting on 12/04/2018 with family and Dr Domingo Cocking and changed his mind and wanted to go home with home health, instead of home hospice. He will remain NPO and take only nutrition via the PEG tube.   Patient seen and examined at bedside.  Currently denies any complaints and is looking forward to going home.  Assessment & Plan:   Principal Problem:   Acute on chronic respiratory failure with hypoxia (HCC) Active Problems:   Protein-calorie malnutrition, severe   Endotracheally intubated   Head and neck cancer (HCC)   S/P percutaneous endoscopic gastrostomy (PEG) tube placement (HCC)   Cachexia (HCC)   Failure to thrive in adult   Hypotension due to drugs  Tracheostomy tube present ([place])   Tracheostomy status ([place])   Palliative care encounter    Acute on chronic respiratory failure with hypoxia status post multiple intubation/extubation followed by trach placement Probably secondary to recurrent aspiration pneumonias as patient is noncompliant to his diet. He is currently has cuffless #4 ,  SLP eval requested for [facility] valve use and is currently using it.  Suspect patient will go home and he will start taking oral intake despite n.p.o. status.  And he is at high risk for recurrent aspiration pneumonias, and worsening respiratory status and death. This was discussed in detail with the patient, but he occasionally requesting  food via oral despite cautionary advice.    He had [place] meeting with Dr Domingo Cocking and initially transitioned to hospice and wants to go ahead and eat via oral without any restrictions and understands the risk of aspiration, leading to aspiration pneumonia and death following which he had another meeting the next day on 12/04/2018 with family and Dr Domingo Cocking and changed his mind and wanted to go home with home health, instead of home hospice.   He will remain NPO and take only nutrition via the PEG tube.   Overnight his sats have remained well over 90% and repeat CXR shows No significant interval change in heterogeneous bilateral airspace opacity, most conspicuous in left lower lobe, consistent with multifocal infection. No new airspace opacity.   Patient completed the course of antibiotics and currently waiting for discharge home once home health is arranged.    Status post PEG placement/dysphagia with severe protein calorie malnutrition Continue  with tube feeds as per patient's request.    History of head and neck cancer status post radiation Pain control with oxycodone and Neurontin. No new changes in ameds.   Anemia of chronic disease Transfuse PRBC to keep hemoglobin greater than 7. Hemoglobin stable around 9. No  blood draws today.    Anxiety  continue with Klonopin and Seroquel.   Cachexia/failure to thrive Status post PEG placement and on tube feeds only.  N.p.o. except for ice chips.  Nutrition on board and assisting with management.   Stage II sacral pressure injury Wound care consulted and recommendations given.  Hypotension bp is 89/64 last night.  Patient asymptomatic and unclear etiology.  He does not appear to be toxic.  Repeat blood pressure this morning's were 110/80   DVT prophylaxis: Lovenox Code Status: DNR Family Communication: None at bedside Disposition Plan: home with home health when arranged.   Consultants:   PCCM  Palliative care   Procedures: MULTIPLE ETT.  S/p tracheostomy  On 8/30  s/p cuffless shiley  on 11/29/2018.    Antimicrobials: completed rocephin for 7 days.   Subjective: Patient seen and examined at bedside patient is on room air at this time denies any chest pain or shortness of breath.  Wants to know when he can go home. Objective: Vitals:   12/07/18 0320 12/07/18 0350 12/07/18 0412 12/07/18 0816  BP:  (!) 92/58 92/63 110/79  Pulse: 75 85    Resp: 20 14    Temp:  98.2 F (36.8 C)  97.7 F (36.5 C)  TempSrc:  Oral  Oral  SpO2: 98% 95%    Weight:  50.7 kg    Height:        Intake/Output Summary (Last 24 hours) at 12/07/2018 0943 Last data filed at 12/07/2018 [id number] Gross per 24 hour  Intake -  Output 675 ml  Net -675 ml   Filed Weights   12/02/18 0800 12/06/18 0800 12/07/18 0350  Weight: 46 kg 46.4 kg 50.7 kg    Examination:  General exam: Cachectic looking gentleman status post trach on room air he is alert and comfortable and walking in the room.  Respiratory system: Diminished air entry at bases but no wheezing or rhonchi.  Cardiovascular system: S1-S2 heard, regular rate rhythm, no pedal edema Gastrointestinal system: Abdomen is soft, nontender, nondistended with good bowel sounds. Central nervous system: Alert and oriented  to place person and time, grossly nonfocal. Extremities: No pedal edema cyanosis or clubbing. Skin: Stage I pressure ulcer on bilateral heels and stage II pressure injury over the sacrum.  Psychiatry: Mood is appropriate   Data Reviewed: I have personally reviewed following labs and imaging studies  CBC: No results for input(s): WBC, NEUTROABS, HGB, HCT, MCV, PLT in the last 168 hours. Basic Metabolic Panel: No results for input(s): NA, K, CL, CO2, GLUCOSE, BUN, CREATININE, CALCIUM, MG, PHOS in the last 168 hours. GFR: Estimated Creatinine Clearance: 73.1 mL/min (A) (by C-G formula based on SCr of 0.31 mg/dL (L)). Liver Function Tests: No results for input(s): AST, ALT, ALKPHOS, BILITOT, PROT, ALBUMIN in the last 168 hours. No results for input(s): LIPASE, AMYLASE in the last 168 hours. No results for input(s): AMMONIA in the last 168 hours. Coagulation Profile: No results for input(s): INR, PROTIME in the last 168 hours. Cardiac Enzymes: No results for input(s): CKTOTAL, CKMB, CKMBINDEX, TROPONINI in the last 168 hours. BNP (last 3 results) No results for input(s): PROBNP in the last 8760 hours. HbA1C: No results  for input(s): HGBA1C in the last 72 hours. CBG: Recent Labs  Lab 12/02/18 1207  GLUCAP 72   Lipid Profile: No results for input(s): CHOL, HDL, LDLCALC, TRIG, CHOLHDL, LDLDIRECT in the last 72 hours. Thyroid Function Tests: No results for input(s): TSH, T4TOTAL, FREET4, T3FREE, THYROIDAB in the last 72 hours. Anemia Panel: No results for input(s): VITAMINB12, FOLATE, FERRITIN, TIBC, IRON, RETICCTPCT in the last 72 hours. Sepsis Labs: No results for input(s): PROCALCITON, LATICACIDVEN in the last 168 hours.  No results found for this or any previous visit (from the past 240 hour(s)).       Radiology Studies: Dg Chest 2 View  Result Date: 12/06/2018 CLINICAL DATA:  Hypoxia EXAM: CHEST - 2 VIEW COMPARISON:  12/03/2018 FINDINGS: No significant interval change in  heterogeneous bilateral airspace opacity, most conspicuous in left lower lobe. No new airspace opacity. Tracheostomy. The heart and mediastinum are normal. Aortic atherosclerosis. Disc degenerative disease of the thoracic spine. IMPRESSION: No significant interval change in heterogeneous bilateral airspace opacity, most conspicuous in left lower lobe, consistent with multifocal infection. No new airspace opacity. Tracheostomy. Electronically Signed   By: Eddie Candle M.D.   On: 12/06/2018 10:49        Scheduled Meds: . acetaminophen (TYLENOL) oral liquid 160 mg/5 mL  650 mg Per Tube Q6H  . aspirin  81 mg Per Tube Daily  . chlorhexidine  15 mL Mouth Rinse BID  . enoxaparin (LOVENOX) injection  30 mg Subcutaneous Q24H  . feeding supplement (JEVITY 1.5 CAL/FIBER)  356 mL Per Tube QID  . free water  120 mL Per Tube QID  . gabapentin  300 mg Per Tube Q8H  . mouth rinse  15 mL Mouth Rinse q12n4p  . oxyCODONE  20 mg Per Tube Q4H  . pantoprazole sodium  40 mg Per Tube QHS  . QUEtiapine  50 mg Oral QHS  . senna-docusate  1 tablet Per Tube Daily   Continuous Infusions: . sodium chloride Stopped (11/20/18 0853)  . sodium chloride Stopped (11/22/18 1900)     LOS: 21 days       Hosie Poisson, MD [facility] Pager [phone]   If 7PM-7AM, please contact night-coverage [email or web address] Password [facility] 12/07/2018, 9:43 AM

## 2018-12-07 NOTE — Plan of Care (Signed)
  Problem: Education: Goal: Knowledge of General Education information will improve Description: Including pain rating scale, medication(s)/side effects and non-pharmacologic comfort measures Outcome: Progressing   Problem: Health Behavior/Discharge Planning: Goal: Ability to manage health-related needs will improve Outcome: Progressing   Problem: Clinical Measurements: Goal: Ability to maintain clinical measurements within normal limits will improve Outcome: Progressing Goal: Will remain free from infection Outcome: Progressing Goal: Diagnostic test results will improve Outcome: Progressing Goal: Respiratory complications will improve Outcome: Progressing Goal: Cardiovascular complication will be avoided Outcome: Progressing   Problem: Activity: Goal: Risk for activity intolerance will decrease Outcome: Progressing   Problem: Elimination: Goal: Will not experience complications related to bowel motility Outcome: Progressing Goal: Will not experience complications related to urinary retention Outcome: Progressing   Problem: Pain Managment: Goal: General experience of comfort will improve Outcome: Progressing   Problem: Skin Integrity: Goal: Risk for impaired skin integrity will decrease Outcome: Progressing   Problem: Activity: Goal: Ability to tolerate increased activity will improve Outcome: Progressing

## 2018-12-07 NOTE — Telephone Encounter (Signed)
I have never seen the patient. One of the hospital docs should sign esp [facility] primary

## 2018-12-07 NOTE — Telephone Encounter (Signed)
Called and spoke with Jeannetta Ellis and advised him of Dr. Golden Pop response. He stated he would have Heather call our office back, she is working remotely.  Message left in triage as FYI.

## 2018-12-07 NOTE — Telephone Encounter (Signed)
Spoke with Nira Conn at [place], states that pt has been prescribed 8-16 hrs of home nursing care from his current hospital admission.  Pt's PCP is refusing to sign the orders, and Medicaid is requires a MD signature for this order.  Pt has been seen by pulmonary in the hospital, is being sent home on a trach.  MR please advise if you're willing to sign these forms.  Thanks!

## 2018-12-07 NOTE — Telephone Encounter (Signed)
lmtcb for Tony Berry at [facility]- It looks like pt is still hospitalized and this home health process is being initiated in the hospital; was not ordered by our office.

## 2018-12-07 NOTE — Care Management (Signed)
CM [place] received call from CM KW.  Tony Berry has located an agency that will assist with needed repairs in the patients home however pt will have to go on a wait list which can take up to a month for initiation of repairs.  Also, Tony Berry also working to gain approval for needed [facility] via medicaid as required for home trach.  CSW will follow up with pt to determine if SNF will be accepted for discharge during the interim.

## 2018-12-08 MED ORDER — LORAZEPAM 0.5 MG PO TABS
0.5000 mg | ORAL_TABLET | Freq: Four times a day (QID) | ORAL | Status: DC | PRN
  Administered 2018-12-08 – 2018-12-12 (×10): 0.5 mg via ORAL
  Filled 2018-12-08 (×10): qty 1

## 2018-12-08 MED ORDER — SODIUM CHLORIDE 0.9 % IV BOLUS
500.0000 mL | Freq: Once | INTRAVENOUS | Status: AC
  Administered 2018-12-08: 500 mL via INTRAVENOUS

## 2018-12-08 NOTE — Plan of Care (Signed)
Patient given oxycodone/tylenol for throat pain as scheduled and ativan prn for anxiety. Patient ate 100% of dinner, no vomiting noted. Given zofran prior to dinner. Patient has suction set up at bedside.    Problem: Education: Goal: Knowledge of General Education information will improve Description: Including pain rating scale, medication(s)/side effects and non-pharmacologic comfort measures Outcome: Progressing   Problem: Clinical Measurements: Goal: Respiratory complications will improve Outcome: Progressing   Problem: Nutrition: Goal: Adequate nutrition will be maintained Outcome: Progressing   Problem: Coping: Goal: Level of anxiety will decrease Outcome: Progressing

## 2018-12-08 NOTE — Progress Notes (Signed)
PT Cancellation Note  Patient Details Name: Tony Berry MRN: [id number] DOB: 1961/02/19   Cancelled Treatment:    Reason Eval/Treat Not Completed: Patient declined, no reason specified. Pt declined PT reporting he does not feel like doing that right now, asked to speak to Dr. Domingo Cocking.  Message sent to MD, will retry therapy at another time.   Ramond Dial 12/08/2018, 11:55 AM   Mee Hives, PT MS Acute Rehab Dept. Number: [place] and [place]

## 2018-12-08 NOTE — Progress Notes (Signed)
PMT no charge note  Received notification from bedside RN and PMT colleague that the patient wished to talk with Dr Domingo Cocking who is now off service.   Call placed, discussed with patient and his bedside RN who was in his room. Patient has PMV valve and trach but was able to talk on phone clearly.  Patient is asking for something to drink/eat. He is also asking about his pain and non pain symptom management medications. He is now willing to consider SNF until arrangements can be made for him to safely be at home.   Plan: Add Ativan PO PRN, patient states Klonopin doesn't help. Continue current pain regimen, discussed with patient about current PO Oxycodone scheduled and PRN Would request SLP repeat evaluation at bedside, if patient is able to tolerate, he'd like to be able to have some ice chips and some bites of ice cream throughout the day. Both bedside RN and I encouraged him to consider resuming his tube feeds and he is now agreeable.   Loistine Chance MD [place] palliative [phone]

## 2018-12-08 NOTE — Progress Notes (Signed)
Palliative Medicine RN Note: Rec'd call from [place] requesting clarification on what Dr Rowe Pavy and patient set as goals/plans for eating. I spoke with Dr Rowe Pavy; she reports pt is aware of risk and would like sips/chips/ice cream if safe. Sent secure message to [place] to relay information.  Tony Berry Tony Karner, RN, BSN, [facility] Palliative Medicine Team 12/08/2018 1:11 PM Office [phone]

## 2018-12-08 NOTE — Progress Notes (Signed)
   Vital Signs MEWS/VS Documentation       12/07/2018 1959 12/07/2018 2007 12/07/2018 2327 12/07/2018 2359   MEWS Score:  1  2  2  1    MEWS Score Color:  Green  Yellow  Yellow  Green   Resp:  12  13  12  16    Pulse:  72  71  69  68   BP:  --  (!) 89/65  --  (!) 84/61   Temp:  --  98 F (36.7 C)  --  98.2 F (36.8 C)   O2 Device:  Tracheostomy Collar  Tracheostomy Collar  Tracheostomy Collar  Tracheostomy Collar   O2 Flow Rate (L/min):  5 L/min  --  5 L/min  5 L/min   FiO2 (%):  28 %  --  28 %  --       MD contacted about BP, awaiting callback. Pt in no distress. Resting in bed. Will continue to monitor.   Dedra Skeens 12/08/2018,12:06 AM

## 2018-12-08 NOTE — TOC Progression Note (Signed)
Transition of Care [facility]) - Progression Note    Patient Details  Name: Tony Berry MRN: [id number] Date of Birth: Nov 10, 1960  Transition of Care [facility] CM/SW [place], [place] Phone Number: 12/08/2018, 4:19 PM  Clinical Narrative:  CSW contacted by RN earlier that patient now agreeable to SNF, submitted FL2 and referred for SNF. Also, per RN, wife concerned about whether Medicaid will pay for equipment for patient to go home after SNF stay. CSW reached out to SNF Admissions to ensure that patient could still get home equipment with Medicaid, and patient can still get coverage for equipment through [facility] after SNF placement.  Reviewed chart after entering FL2, noting that plan has changed to hospice. CSW sent referral to [facility] with [place] for review. CSW to follow.     Expected Discharge Plan: Long Term Nursing Home Barriers to Discharge: Continued Medical Work up  Expected Discharge Plan and Services Expected Discharge Plan: [place]   Discharge Planning Services: CM Consult   Living arrangements for the past 2 months: Apartment                                       Social Determinants of Health (SDOH) Interventions    Readmission Risk Interventions Readmission Risk Prevention Plan 10/01/2018  Transportation Screening Complete  PCP or Specialist Appt within 3-5 Days Complete  HRI or [place] Complete  Social Work Consult for [place] Planning/Counseling Complete  Palliative Care Screening Complete  Medication Review Press photographer) Complete  Some recent data might be hidden

## 2018-12-08 NOTE — Evaluation (Signed)
Clinical/Bedside Swallow Evaluation Patient Details  Name: Tony Berry MRN: [id number] Date of Birth: 1961/01/28  Today's Date: 12/08/2018 Time: SLP Start Time (ACUTE ONLY): 1435 SLP Stop Time (ACUTE ONLY): 1455 SLP Time Calculation (min) (ACUTE ONLY): 20 min  Past Medical History:  Past Medical History:  Diagnosis Date  . Esophageal cancer ([place])   . Head and neck cancer ([place])   . Hypertension    Past Surgical History:  Past Surgical History:  Procedure Laterality Date  . BACK SURGERY  2008, 2011  . FOOT SURGERY Right   . IR GASTROSTOMY TUBE MOD SED  04/08/2018  . LEG SURGERY Right    HPI:  Patient is a 58 y.o. male with PMH: head and neck cancer s/p radiation, chronic respiratory failure on 3L O2 at baseline, HTN, hyponatremia, severe protient calorie malnutrition, PVD, dysphagia, recent admission last month for aspiration PNA but without intubation, has a PEG but continues to eat and drink and reportedly not following SLP's recommendations. Most recent MBS was completed 03/12/18 as inpatient with results: chronic pharyngeal dysphagia  with consistent penetration of all bolus consistencies into larynx; portion of penetrates passed through vocal folds and into trachea resulting in sensed aspiration. As nectar thick liquids were tolerated slightly better than thin liquids, recommendation was for Dysphagia 2 (fine chop), nectar thick liquids, with additional recommendation for ENT repeat laryngoscopy.   Assessment / Plan / Recommendation Clinical Impression  Pt seen after request for swallow eval from Palliative Medicine team. Pt desires to eat and drink, at times verbalizing awareness of dysphagia and risk, but at other times reporting that he can swallow well. He has had many mixed messages over the past few years and verbalizes a mix of opinions. He is clear in his desire to eat and drink, hamburgers, ice cream, ice, etc. Upon arrival he was sucking on a lollipop and red candy-tinged  secretions noted to be coming from trach. Though pt is capable of consuming ice and ice cream without visible difficulty, all evidence suggests high risk of aspiration with all PO. Pt at the time stated a desire to have another MBS to determine what is "safest,"  Since that discussion however MD has clarified plan of care to full comfort, which is appropriate. Pt will transition to hospice and consume foods and liquids of choice. Given this decision SLP will sign off. If plan of care changes or pt repeats desire to reassess his swallowing SLP can complete an MBS.   SLP Visit Diagnosis: Dysphagia, oropharyngeal phase (R13.12)    Aspiration Risk  Severe aspiration risk    Diet Recommendation Other (Comment)(comfort feeding. )        Other  Recommendations Other Recommendations: Have oral suction available   Follow up Recommendations        Frequency and Duration            Prognosis        Swallow Study   General HPI: Patient is a 58 y.o. male with PMH: head and neck cancer s/p radiation, chronic respiratory failure on 3L O2 at baseline, HTN, hyponatremia, severe protient calorie malnutrition, PVD, dysphagia, recent admission last month for aspiration PNA but without intubation, has a PEG but continues to eat and drink and reportedly not following SLP's recommendations. Most recent MBS was completed 03/12/18 as inpatient with results: chronic pharyngeal dysphagia  with consistent penetration of all bolus consistencies into larynx; portion of penetrates passed through vocal folds and into trachea resulting in sensed aspiration. As  nectar thick liquids were tolerated slightly better than thin liquids, recommendation was for Dysphagia 2 (fine chop), nectar thick liquids, with additional recommendation for ENT repeat laryngoscopy. Type of Study: Bedside Swallow Evaluation Previous Swallow Assessment: MBS 02/2018 revealed chronic dysphagai, recommending Dys 2, nectar liquids with aspiration  risk Diet Prior to this Study: NPO Respiratory Status: Room air History of Recent Intubation: No Behavior/Cognition: Alert;Cooperative Oral Care Completed by SLP: No Oral Cavity - Dentition: Poor condition Vision: Functional for self-feeding Self-Feeding Abilities: Able to feed self Patient Positioning: Upright in chair Baseline Vocal Quality: Hoarse;Low vocal intensity Volitional Cough: Weak Volitional Swallow: Able to elicit    Oral/Motor/Sensory Function Overall Oral Motor/Sensory Function: Within functional limits   Ice Chips Ice chips: Impaired Pharyngeal Phase Impairments: Throat Clearing - Immediate;Decreased hyoid-laryngeal movement   Thin Liquid Thin Liquid: Not tested    Nectar Thick Nectar Thick Liquid: Not tested   Honey Thick     Puree Puree: Impaired Pharyngeal Phase Impairments: Decreased hyoid-laryngeal movement   Solid     Solid: Not tested     Herbie Baltimore, MA CCC-SLP  Acute Rehabilitation Services Pager [phone] Office [phone]  Lynann Beaver 12/08/2018,3:36 PM

## 2018-12-08 NOTE — Progress Notes (Signed)
PMT progress note  12.40 PM on 12-08-2018  Received notification from bedside RN and PMT colleague that the patient wished to talk with Dr Domingo Cocking who is now off service.   Call placed, discussed with patient and his bedside RN who was in his room. Patient has PMV valve and trach but was able to talk on phone clearly.  Patient is asking for something to drink/eat. He is also asking about his pain and non pain symptom management medications. He is now willing to consider SNF until arrangements can be made for him to safely be at home.   Plan: Add Ativan PO PRN, patient states Klonopin doesn't help. Continue current pain regimen, discussed with patient about current PO Oxycodone scheduled and PRN Would request SLP repeat evaluation at bedside, if patient is able to tolerate, he'd like to be able to have some ice chips and some bites of ice cream throughout the day. Both bedside RN and I encouraged him to consider resuming his tube feeds and he is now agreeable.   Loistine Chance MD [facility] [phone]  Addendum: 12-08-2018 1500  Arrived at bedside, after discussing with the patient's wife on the phone.   Patient sitting up in bed, asking for something to eat/drink. States, "I'm going to die any ways. This is hard."  BP (!) 96/59 (BP Location: Right Arm)   Pulse 82   Temp 98.2 F (36.8 C) (Oral)   Resp 16   Ht 5\' 6"  (1.676 m)   Wt 47.6 kg   SpO2 94%   BMI 16.94 kg/m  Labs and imaging noted Chart reviewed in detail.  Awake alert Sitting up in bed Has trach Has access to suction Regular Has tattoos Abdomen: has PEG No edema Non focal  Goals of care and appropriate disposition discussions have been held with the patient as well as his wife as well as bedside nursing staff throughout the day today.  Patient's wife states that she understands that the patient feels aggravated.  She has discussed with the patient and both of them would now like to proceed with  residential hospice.  They are choosing to focus on comfort feeds and comfort measures at end-of-life.  I arrived at the bedside after having discussed with his wife.  Discussed with the patient about the type of care that is offered in residential hospice.  Discussed that goals are not for life prolongation rather focused on comfort inside residential hospice.  At times, this includes discontinuation of tube feeds and only focusing on what ever the patient can take by mouth and that the patient is at high risk for ongoing aspiration. Will request social worker assistance in requesting a consult from residential hospice in [place]. PMT will continue to follow along. 35 minutes spent Loistine Chance MD

## 2018-12-08 NOTE — Progress Notes (Signed)
PROGRESS NOTE    Tony Berry  [id number] DOB: Jan 30, 1961 DOA: 11/16/2018 PCP: Cher Nakai, MD    Brief Narrative:  58 year old gentleman with prior history of head and neck cancer status post radiation therapy, esophageal cancer, recurrent aspiration pneumonia, COPD, hypertension, noncompliant to aspiration precautions presented to ED on 8/25 unresponsive and hypoxemic requiring intubation.  And he was found to have left lower lobe opacification consistent with aspiration pneumonia and was started on broad-spectrum antibiotics.  He was extubated on 8/27  But re intubated and underwent tracheostomy on 8/30.  PT eval recommended SNF.  But patient is adamantly refusing to go to rehab and wants to go home.  Meanwhile Education officer, museum and case management working to provide home health services on discharge. PCCM changed his trach to cuffless Shiley 4 on 11/30/2018.  He will remain NPO and take only nutrition via the PEG tube.     Assessment & Plan:   Principal Problem:   Acute on chronic respiratory failure with hypoxia (HCC) Active Problems:   Protein-calorie malnutrition, severe   Endotracheally intubated   Head and neck cancer (HCC)   S/P percutaneous endoscopic gastrostomy (PEG) tube placement (HCC)   Cachexia (HCC)   Failure to thrive in adult   Hypotension due to drugs   Tracheostomy tube present ([place])   Tracheostomy status ([place])   Palliative care encounter  Acute on chronic respiratory failure with hypoxia status post multiple intubation, now with trach collar: Stabilized.  Cuffless #4.  Currently using Passy-Muir valve.  Home arrangements pending. Completed course of antibiotics.  High aspiration risk.  Compliance will be an issue.  Severe protein calorie malnutrition: Status post PEG tube placement.  Feeding as patient wishes.  He says he will only take tube feeding when he feels hungry.   Anemia of chronic disease: Hemoglobin is stable.  History of head and neck cancer  status post radiation: With chronic pain issues.  Was on methadone at home.  He is not on methadone program, will continue oral oxycodone.  Discontinue IV pain medications in anticipation of transition from the hospital.  Also uses Klonopin and Seroquel at home that he will continue.  Stage II sacral pressure injury: Wound care consulted.  Currently remains on barrier dressing.  Hypertension: Asymptomatic hypotension at nighttime.  No indication to treat.  Disposition: Pending.  Patient wants to go home.  His home is apparently not suitable for DME including hospital bed and trach supplies.    DVT prophylaxis: Lovenox subcu Code Status: DNR Family Communication: None Disposition Plan: Pending   Consultants:   PCCM  Palliative care  Procedures:   Tracheostomy, 8/30  Antimicrobials:   Completed Rocephin for 7 days   Subjective: Patient seen and examined at the bedside.  Denies any complaints.  Says that he has pain in the neck and he needs pain medication.  He is wanting to go home.  Objective: Vitals:   12/08/18 0329 12/08/18 0340 12/08/18 0716 12/08/18 0749  BP:  (!) 82/53 (!) 96/59   Pulse: 77 78 72 70  Resp: 14 13 14    Temp:  97.9 F (36.6 C) 98.2 F (36.8 C)   TempSrc:  Oral Oral   SpO2: 93% 93% 91% 94%  Weight:      Height:       No intake or output data in the 24 hours ending 12/08/18 1056 Filed Weights   12/06/18 0800 12/07/18 0350 12/07/18 0800  Weight: 46.4 kg 50.7 kg 47.6 kg  Examination:  General exam: Appears calm and comfortable, chronically sick looking, cachectic, Respiratory system: Clear to auscultation. Respiratory effort normal.  Occasional conducted airway sounds.  Tracheostomy with occasional cough, Passy-Muir valve present and able to talk. Cardiovascular system: S1 & S2 heard, RRR. No JVD, murmurs, rubs, gallops or clicks. No pedal edema. Gastrointestinal system: Abdomen is nondistended, soft and nontender. No organomegaly or masses  felt. Normal bowel sounds heard.  PEG tube in place. Central nervous system: Alert and oriented. No focal neurological deficits. Extremities: Symmetric 5 x 5 power. Skin: No rashes, lesions or ulcers Psychiatry: Judgement and insight appear normal. Mood & affect appropriate.     Data Reviewed: I have personally reviewed following labs and imaging studies  CBC: No results for input(s): WBC, NEUTROABS, HGB, HCT, MCV, PLT in the last 168 hours. Basic Metabolic Panel: No results for input(s): NA, K, CL, CO2, GLUCOSE, BUN, CREATININE, CALCIUM, MG, PHOS in the last 168 hours. GFR: Estimated Creatinine Clearance: 68.6 mL/min (A) (by C-G formula based on SCr of 0.31 mg/dL (L)). Liver Function Tests: No results for input(s): AST, ALT, ALKPHOS, BILITOT, PROT, ALBUMIN in the last 168 hours. No results for input(s): LIPASE, AMYLASE in the last 168 hours. No results for input(s): AMMONIA in the last 168 hours. Coagulation Profile: No results for input(s): INR, PROTIME in the last 168 hours. Cardiac Enzymes: No results for input(s): CKTOTAL, CKMB, CKMBINDEX, TROPONINI in the last 168 hours. BNP (last 3 results) No results for input(s): PROBNP in the last 8760 hours. HbA1C: No results for input(s): HGBA1C in the last 72 hours. CBG: Recent Labs  Lab 12/02/18 1207  GLUCAP 72   Lipid Profile: No results for input(s): CHOL, HDL, LDLCALC, TRIG, CHOLHDL, LDLDIRECT in the last 72 hours. Thyroid Function Tests: No results for input(s): TSH, T4TOTAL, FREET4, T3FREE, THYROIDAB in the last 72 hours. Anemia Panel: No results for input(s): VITAMINB12, FOLATE, FERRITIN, TIBC, IRON, RETICCTPCT in the last 72 hours. Sepsis Labs: No results for input(s): PROCALCITON, LATICACIDVEN in the last 168 hours.  No results found for this or any previous visit (from the past 240 hour(s)).       Radiology Studies: No results found.      Scheduled Meds: . acetaminophen (TYLENOL) oral liquid 160 mg/5 mL   650 mg Per Tube Q6H  . aspirin  81 mg Per Tube Daily  . chlorhexidine  15 mL Mouth Rinse BID  . enoxaparin (LOVENOX) injection  30 mg Subcutaneous Q24H  . feeding supplement (JEVITY 1.5 CAL/FIBER)  237 mL Per Tube QID  . feeding supplement (PRO-STAT SUGAR FREE 64)  60 mL Per Tube BID  . free water  120 mL Per Tube QID  . gabapentin  300 mg Per Tube Q8H  . mouth rinse  15 mL Mouth Rinse q12n4p  . oxyCODONE  20 mg Per Tube Q4H  . pantoprazole sodium  40 mg Per Tube QHS  . QUEtiapine  50 mg Oral QHS  . senna-docusate  1 tablet Per Tube Daily   Continuous Infusions: . sodium chloride Stopped (11/20/18 0853)  . sodium chloride Stopped (11/22/18 1900)     LOS: 22 days    Time spent: 25 minutes    Barb Merino, MD [facility] Pager [phone]  If 7PM-7AM, please contact night-coverage [email or web address] Password [facility] 12/08/2018, 10:56 AM

## 2018-12-08 NOTE — Plan of Care (Signed)

## 2018-12-08 NOTE — NC FL2 (Signed)
[place] LEVEL OF CARE SCREENING TOOL     IDENTIFICATION  Patient Name: Tony Berry Birthdate: 06/01/1960 Sex: male Admission Date (Current Location): 11/16/2018  [facility] and [place] Number:  [employer] and Address:  [facility] [place] [address], [place], [place] [address]      Provider Number: [id number]  Attending Physician Name and Address:  Barb Merino, MD  Relative Name and Phone Number:       Current Level of Care: Hospital Recommended Level of Care: [place] Prior Approval Number:    Date Approved/Denied:   PASRR Number: [id number]  Discharge Plan: SNF    Current Diagnoses: Patient Active Problem List   Diagnosis Date Noted  . Tracheostomy status ([place])   . Palliative care encounter   . Tracheostomy tube present ([place])   . Endotracheally intubated   . Head and neck cancer ([place])   . S/P percutaneous endoscopic gastrostomy (PEG) tube placement ([place])   . Cachexia ([place])   . Failure to thrive in adult   . Hypotension due to drugs   . Acute on chronic respiratory failure with hypoxia ([place]) 11/16/2018  . Encounter for imaging study to confirm orogastric (OG) tube placement   . History of ETT   . Palliative care by specialist   . Acute on chronic respiratory failure with hypoxia and hypercapnia ([place]) 10/26/2018  . Pressure injury of skin 10/26/2018  . Aspiration into airway 10/26/2018  . History of head and neck cancer 10/26/2018  . Fall at home, initial encounter 09/28/2018  . Goals of care, counseling/discussion   . DNR (do not resuscitate) discussion   . Chest discomfort 05/06/2018  . Dysphagia 04/08/2018  . Aspiration pneumonia ([place]) 04/04/2018  . Acute on chronic respiratory failure ([place]) 03/15/2018  . Carditis   . Troponin level elevated 03/11/2018  . Protein-calorie malnutrition, severe 03/11/2018  . Hyponatremia 03/11/2018  . Hypophosphatemia 03/11/2018  . Multifocal pneumonia  03/10/2018    Orientation RESPIRATION BLADDER Height & Weight     Self, Situation, Time, Place  Tracheostomy, O2(shiley 3mm uncuffed, 5L at 28%) Continent Weight: 104 lb 15 oz (47.6 kg) Height:  5\' 6"  (167.6 cm)  BEHAVIORAL SYMPTOMS/MOOD NEUROLOGICAL BOWEL NUTRITION STATUS      Continent Feeding tube(jevity 237 mL 4x/day)  AMBULATORY STATUS COMMUNICATION OF NEEDS Skin   Supervision Verbally PU Stage and Appropriate Care PU Stage 1 Dressing: (right hip, foam dressing changed PRN; right and left heel, no dressing; left ankle, no dressing) PU Stage 2 Dressing: (sacrum, foam dressing changed every 3 days)                   Personal Care Assistance Level of Assistance  Bathing, Dressing, Feeding Bathing Assistance: Limited assistance Feeding assistance: Maximum assistance Dressing Assistance: Limited assistance     Functional Limitations Info  Sight, Speech, Hearing Sight Info: Adequate Hearing Info: Adequate Speech Info: Impaired(passy muir valve)    SPECIAL CARE FACTORS FREQUENCY  PT (By licensed PT), OT (By licensed OT), Speech therapy     PT Frequency: 3x/wk OT Frequency: 3x/wk     Speech Therapy Frequency: 3x/wk      Contractures Contractures Info: Not present    Additional Factors Info  Code Status, Allergies, Psychotropic Code Status Info: DNR Allergies Info: NKA Psychotropic Info: Seroquel 50mg  daily at bed         Current Medications (12/08/2018):  This is the current hospital active medication list Current Facility-Administered Medications  Medication Dose Route Frequency Provider Last Rate Last Dose  . 0.9 %  sodium chloride infusion   Intravenous PRN Margaretha Seeds, MD   Stopped at 11/20/18 [phone]  . 0.9 %  sodium chloride infusion   Intravenous PRN Margaretha Seeds, MD   Stopped at 11/22/18 1900  . acetaminophen (TYLENOL) solution 650 mg  650 mg Per Tube Q6H Candee Furbish, MD   650 mg at 12/08/18 1308  . aspirin chewable tablet 81 mg  81 mg Per  Tube Daily Candee Furbish, MD   81 mg at 12/08/18 [id number]  . chlorhexidine (PERIDEX) 0.12 % solution 15 mL  15 mL Mouth Rinse BID Candee Furbish, MD   15 mL at 12/08/18 0937  . docusate (COLACE) 50 MG/5ML liquid 100 mg  100 mg Per Tube BID PRN Scatliffe, [name], MD   100 mg at 11/22/18 0808  . enoxaparin (LOVENOX) injection 30 mg  30 mg Subcutaneous Q24H Debbe Odea, MD   30 mg at 12/08/18 0938  . feeding supplement (JEVITY 1.5 CAL/FIBER) liquid 237 mL  237 mL Per Tube QID Hosie Poisson, MD   237 mL at 12/08/18 1311  . feeding supplement (PRO-STAT SUGAR FREE 64) liquid 60 mL  60 mL Per Tube BID Hosie Poisson, MD   60 mL at 12/08/18 0936  . free water 120 mL  120 mL Per Tube QID Debbe Odea, MD   120 mL at 12/08/18 1310  . gabapentin (NEURONTIN) 250 MG/5ML solution 300 mg  300 mg Per Tube Q8H Candee Furbish, MD   300 mg at 12/08/18 1310  . guaiFENesin (ROBITUSSIN) 100 MG/5ML solution 100 mg  5 mL Per Tube Q4H PRN Candee Furbish, MD   100 mg at 12/03/18 2213  . ipratropium-albuterol (DUONEB) 0.5-2.5 (3) MG/3ML nebulizer solution 3 mL  3 mL Nebulization Q4H PRN Candee Furbish, MD      . LORazepam (ATIVAN) tablet 0.5 mg  0.5 mg Oral Q6H PRN Loistine Chance, MD   0.5 mg at 12/08/18 1325  . MEDLINE mouth rinse  15 mL Mouth Rinse q12n4p Candee Furbish, MD   15 mL at 12/08/18 1506  . ondansetron (ZOFRAN) injection 4 mg  4 mg Intravenous Q6H PRN Scatliffe, [name], MD   4 mg at 12/08/18 1516  . oxyCODONE (ROXICODONE) 5 MG/5ML solution 20 mg  20 mg Per Tube Q4H Micheline Rough, MD   20 mg at 12/08/18 1506  . oxyCODONE (ROXICODONE) 5 MG/5ML solution 20 mg  20 mg Per Tube Q2H PRN Micheline Rough, MD   20 mg at 12/08/18 1308  . pantoprazole sodium (PROTONIX) 40 mg/20 mL oral suspension 40 mg  40 mg Per Tube QHS Margaretha Seeds, MD   40 mg at 12/07/18 2143  . phenol (CHLORASEPTIC) mouth spray 1 spray  1 spray Mouth/Throat PRN Gardiner Barefoot, NP   1 spray at 12/07/18 1948  . QUEtiapine (SEROQUEL)  tablet 50 mg  50 mg Oral QHS Candee Furbish, MD   50 mg at 12/07/18 2143  . Resource ThickenUp Clear   Oral PRN Hosie Poisson, MD      . senna-docusate (Senokot-S) tablet 1 tablet  1 tablet Per Tube Daily Margaretha Seeds, MD   1 tablet at 12/07/18 0804     Discharge Medications: Please see discharge summary for a list of discharge medications.  Relevant Imaging Results:  Relevant Lab Results:   Additional Information SS#: [id number]  Jarome Matin  [place],

## 2018-12-09 ENCOUNTER — Telehealth: Payer: Self-pay | Admitting: Internal Medicine

## 2018-12-09 DIAGNOSIS — R52 Pain, unspecified: Secondary | ICD-10-CM

## 2018-12-09 DIAGNOSIS — J690 Pneumonitis due to inhalation of food and vomit: Secondary | ICD-10-CM

## 2018-12-09 MED ORDER — HYDROMORPHONE HCL 1 MG/ML IJ SOLN
0.5000 mg | INTRAMUSCULAR | Status: DC | PRN
  Administered 2018-12-09 – 2018-12-13 (×22): 0.5 mg via INTRAVENOUS
  Filled 2018-12-09 (×23): qty 1

## 2018-12-09 MED ORDER — OXYCODONE-ACETAMINOPHEN 5-325 MG PO TABS
1.0000 | ORAL_TABLET | ORAL | Status: DC | PRN
  Administered 2018-12-09 – 2018-12-10 (×4): 1 via ORAL
  Filled 2018-12-09 (×4): qty 1

## 2018-12-09 NOTE — Telephone Encounter (Signed)
Nira Conn is returning call. CB is [phone].

## 2018-12-09 NOTE — Progress Notes (Signed)
Physical Therapy Treatment Patient Details Name: Tony Berry MRN: [id number] DOB: Jun 29, 1960 Today's Date: 12/09/2018    History of Present Illness 58 year old male with PMH head and neck cancer s/p radiation therapy, COPD, and recurrent aspiration. He has PEG in place. He has recent admissions to [facility] and [facility] for recurrent aspiration most recently 8/4 when he required intubation.8/25 he became unresponsive at home and his wife called EMS. Found to haveAcute on chronic hypoxemic/hypercabric respiratory failure likely secondary to chronic aspiration, high dose Methadone and underlying emphysema andAcute encephalopathy secondary to acute on chronic respiratory failure. Intubated 8/25, extubated 8/27 and reintubated same day. Trached 8/30 and now on trach collar    PT Comments    Patient observed walking in hallway to get himself a drink at the nurses station. No AD, no difficulty noted. Patient/RN reports he has been up walking in the hall independently this morning. Does not feel like he needs continued PT at this time.  PT will sign off at this time. If changes occur please re-consult PT.    Follow Up Recommendations  Supervision - Intermittent;No PT follow up     Equipment Recommendations  None recommended by PT    Recommendations for Other Services       Precautions / Restrictions Precautions Precautions: Fall Precaution Comments: PEG tube baseline; new tracheostomy, PMSV    Mobility  Bed Mobility Overal bed mobility: Modified Independent Bed Mobility: Supine to Sit     Supine to sit: Modified independent (Device/Increase time)     General bed mobility comments: RN reports patient is up walking in hallway by himself  Transfers Overall transfer level: Independent Equipment used: None Transfers: Sit to/from Stand Sit to Stand: Independent Stand pivot transfers: Independent          Ambulation/Gait Ambulation/Gait assistance: Supervision Gait  Distance (Feet): 150 Feet         General Gait Details: Observed patient walk out of his room down the hall to nurses desk to get drink. No difficulties noted.   Stairs             Wheelchair Mobility    Modified Rankin (Stroke Patients Only)       Balance Overall balance assessment: No apparent balance deficits (not formally assessed)   Sitting balance-Leahy Scale: Normal Sitting balance - Comments: no problems noted with sitting balance   Standing balance support: No upper extremity supported;During functional activity Standing balance-Leahy Scale: Good Standing balance comment: ambulating in hallway without AD                            Cognition Arousal/Alertness: Awake/alert Behavior During Therapy: WFL for tasks assessed/performed Overall Cognitive Status: Within Functional Limits for tasks assessed                                 General Comments: baseline non-compliant      Exercises      General Comments        Pertinent Vitals/Pain Pain Assessment: 0-10 Pain Score: 7  Pain Location: neck, throat Pain Descriptors / Indicators: Sore;Discomfort Pain Intervention(s): Monitored during session;Limited activity within patient's tolerance    Home Living                      Prior Function            PT  Goals (current goals can now be found in the care plan section) Acute Rehab PT Goals Patient Stated Goal: to go home PT Goal Formulation: With patient Time For Goal Achievement: 12/21/18 Potential to Achieve Goals: Good Progress towards PT goals: Goals met/education completed, patient discharged from PT    Frequency    Min 3X/week      PT Plan Discharge plan needs to be updated    Co-evaluation              AM-PAC PT "6 Clicks" Mobility   Outcome Measure  Help needed turning from your back to your side while in a flat bed without using bedrails?: None Help needed moving from lying on your  back to sitting on the side of a flat bed without using bedrails?: None Help needed moving to and from a bed to a chair (including a wheelchair)?: None Help needed standing up from a chair using your arms (e.g., wheelchair or bedside chair)?: None Help needed to walk in hospital room?: None Help needed climbing 3-5 steps with a railing? : A Little 6 Click Score: 23    End of Session   Activity Tolerance: Patient tolerated treatment well Patient left: in bed;with call bell/phone within reach Nurse Communication: Mobility status PT Visit Diagnosis: Muscle weakness (generalized) (M62.81)     Time: 0488-8916 PT Time Calculation (min) (ACUTE ONLY): 8 min  Charges:  $Gait Training: 8-22 mins                     Latitia Housewright, PT, GCS 12/09/18,12:51 PM

## 2018-12-09 NOTE — Progress Notes (Signed)
PROGRESS NOTE    Tony Berry  [id number] DOB: September 17, 1960 DOA: 11/16/2018 PCP: Cher Nakai, MD    Brief Narrative:  58 year old gentleman with prior history of head and neck cancer status post radiation therapy, esophageal cancer, recurrent aspiration pneumonia, COPD, hypertension, noncompliant to aspiration precautions presented to ED on 8/25 unresponsive and hypoxemic requiring intubation.  And he was found to have left lower lobe opacification consistent with aspiration pneumonia and was started on broad-spectrum antibiotics.  He was extubated on 8/27  But re intubated and underwent tracheostomy on 8/30.   PT eval recommended SNF.  But patient is adamantly refusing to go to rehab and wants to go home.  Meanwhile Education officer, museum and case management working to provide home health services on discharge. PCCM changed his trach to cuffless Shiley 4 on 11/30/2018.  12/08/2018: Patient first agreed for SNF then now agreed for hospice care.  He is allowed to eat after changing to DNR.  Waiting for hospice evaluation.    Assessment & Plan:   Principal Problem:   Acute on chronic respiratory failure with hypoxia (HCC) Active Problems:   Protein-calorie malnutrition, severe   Endotracheally intubated   Head and neck cancer (HCC)   S/P percutaneous endoscopic gastrostomy (PEG) tube placement (HCC)   Cachexia (HCC)   Failure to thrive in adult   Hypotension due to drugs   Tracheostomy tube present ([place])   Tracheostomy status ([place])   Palliative care encounter  Acute on chronic respiratory failure with hypoxia status post multiple intubation, now with trach collar: Stabilized.  Cuffless #4.  Currently using Passy-Muir valve.  Home arrangements pending. Completed course of antibiotics.  High aspiration risk.  Allowed to eat with patient's request to take risk.  Severe protein calorie malnutrition: Status post PEG tube placement.  Feeding as patient wishes.  He says he will only take tube  feeding when he feels hungry.   Anemia of chronic disease: Hemoglobin is stable.  History of head and neck cancer status post radiation: With chronic pain issues.  Was on methadone at home.  He is not on methadone program, will continue oral oxycodone.  Also uses Klonopin and Seroquel at home that he will continue.  Stage II sacral pressure injury: Wound care consulted.  Currently remains on barrier dressing.  Hypotension: Asymptomatic hypotension at nighttime.  No indication to treat.  Disposition: Skilled nursing facility versus hospice home.   DVT prophylaxis: Lovenox subcu Code Status: DNR Family Communication: None Disposition Plan: Pending, medically stable.  Waiting for hospice evaluation.   Consultants:   PCCM  Palliative care  Procedures:   Tracheostomy, 8/30  Antimicrobials:   Completed Rocephin for 7 days   Subjective: Patient seen and examined at the bedside.  No overnight events.  He wants Percocet instead of oxycodone. He is happy to be able to eat ice cream. He is looking forward to go to hospice and go home from hospice.  Objective: Vitals:   12/09/18 0007 12/09/18 0328 12/09/18 0802 12/09/18 0819  BP:   104/74   Pulse: 74 92 77 74  Resp: 18 20 19 18   Temp:   98.1 F (36.7 C)   TempSrc:   Oral   SpO2:  94% 100% 95%  Weight:      Height:        Intake/Output Summary (Last 24 hours) at 12/09/2018 1159 Last data filed at 12/08/2018 1829 Gross per 24 hour  Intake 240 ml  Output 0 ml  Net 240 ml  Filed Weights   12/07/18 0350 12/07/18 0800 12/08/18 0800  Weight: 50.7 kg 47.6 kg 47 kg    Examination:  General exam: Appears calm and comfortable, chronically sick looking, cachectic, Respiratory system: Clear to auscultation. Respiratory effort normal.  Occasional conducted airway sounds.  Tracheostomy with occasional cough, Passy-Muir valve present and able to talk. Cardiovascular system: S1 & S2 heard, RRR. No JVD, murmurs, rubs, gallops or  clicks. No pedal edema. Gastrointestinal system: Abdomen is nondistended, soft and nontender. No organomegaly or masses felt. Normal bowel sounds heard.  PEG tube in place. Central nervous system: Alert and oriented. No focal neurological deficits. Extremities: Symmetric 5 x 5 power. Skin: No rashes, lesions or ulcers Psychiatry: Judgement and insight appear normal. Mood & affect appropriate.     Data Reviewed: I have personally reviewed following labs and imaging studies  CBC: No results for input(s): WBC, NEUTROABS, HGB, HCT, MCV, PLT in the last 168 hours. Basic Metabolic Panel: No results for input(s): NA, K, CL, CO2, GLUCOSE, BUN, CREATININE, CALCIUM, MG, PHOS in the last 168 hours. GFR: Estimated Creatinine Clearance: 67.7 mL/min (A) (by C-G formula based on SCr of 0.31 mg/dL (L)). Liver Function Tests: No results for input(s): AST, ALT, ALKPHOS, BILITOT, PROT, ALBUMIN in the last 168 hours. No results for input(s): LIPASE, AMYLASE in the last 168 hours. No results for input(s): AMMONIA in the last 168 hours. Coagulation Profile: No results for input(s): INR, PROTIME in the last 168 hours. Cardiac Enzymes: No results for input(s): CKTOTAL, CKMB, CKMBINDEX, TROPONINI in the last 168 hours. BNP (last 3 results) No results for input(s): PROBNP in the last 8760 hours. HbA1C: No results for input(s): HGBA1C in the last 72 hours. CBG: Recent Labs  Lab 12/02/18 1207  GLUCAP 72   Lipid Profile: No results for input(s): CHOL, HDL, LDLCALC, TRIG, CHOLHDL, LDLDIRECT in the last 72 hours. Thyroid Function Tests: No results for input(s): TSH, T4TOTAL, FREET4, T3FREE, THYROIDAB in the last 72 hours. Anemia Panel: No results for input(s): VITAMINB12, FOLATE, FERRITIN, TIBC, IRON, RETICCTPCT in the last 72 hours. Sepsis Labs: No results for input(s): PROCALCITON, LATICACIDVEN in the last 168 hours.  No results found for this or any previous visit (from the past 240 hour(s)).        Radiology Studies: No results found.      Scheduled Meds: . acetaminophen (TYLENOL) oral liquid 160 mg/5 mL  650 mg Per Tube Q6H  . aspirin  81 mg Per Tube Daily  . chlorhexidine  15 mL Mouth Rinse BID  . enoxaparin (LOVENOX) injection  30 mg Subcutaneous Q24H  . feeding supplement (JEVITY 1.5 CAL/FIBER)  237 mL Per Tube QID  . feeding supplement (PRO-STAT SUGAR FREE 64)  60 mL Per Tube BID  . free water  120 mL Per Tube QID  . gabapentin  300 mg Per Tube Q8H  . mouth rinse  15 mL Mouth Rinse q12n4p  . pantoprazole sodium  40 mg Per Tube QHS  . QUEtiapine  50 mg Oral QHS  . senna-docusate  1 tablet Per Tube Daily   Continuous Infusions: . sodium chloride Stopped (11/20/18 0853)  . sodium chloride Stopped (11/22/18 1900)     LOS: 23 days    Time spent: 25 minutes    Barb Merino, MD [facility] Pager [phone]  If 7PM-7AM, please contact night-coverage [email or web address] Password [email or web address] 12/09/2018, 11:59 AM

## 2018-12-09 NOTE — Progress Notes (Signed)
Came into room, patient sitting in chair near door, seemed very tired, dozing in chair.  Got patient back into bed.  Continuous pulse ox had been turned off and was not on patient's finger.  Placed patient back on and turned on machine.  Patient sats were in upper 80s, placed patient back on and he slowly started coming up into 90s.  Patient at 91% at time of note and resting in bed.  Will encourage patient to try to keep oximetry on finger and rest with ATC on.

## 2018-12-09 NOTE — Progress Notes (Signed)
Occupational Therapy Treatment Patient Details Name: Tony Berry MRN: [id number] DOB: 04/22/1960 Today's Date: 12/09/2018    History of present illness 58 year old male with PMH head and neck cancer s/p radiation therapy, COPD, and recurrent aspiration. He has PEG in place. He has recent admissions to [facility] for recurrent aspiration most recently 8/4 when he required intubation.8/25 he became unresponsive at home and his wife called EMS. Found to haveAcute on chronic hypoxemic/hypercabric respiratory failure likely secondary to chronic aspiration, high dose Methadone and underlying emphysema andAcute encephalopathy secondary to acute on chronic respiratory failure. Intubated 8/25, extubated 8/27 and reintubated same day. Trached 8/30 and now on trach collar   OT comments  Pt. With limited participation secondary to fatigue. Pt. Refused to sit up in chair. Pt. Was Max A  To doff B shoes today. Pt. Was s with supine to sit. Pt. Was S with washing hands and face.  Pt. Refused further ADLs.   Follow Up Recommendations  SNF;Home health OT;Supervision/Assistance - 24 hour    Equipment Recommendations       Recommendations for Other Services      Precautions / Restrictions Precautions Precautions: Fall Precaution Comments: PEG tube baseline; new tracheostomy, PMSV       Mobility Bed Mobility Overal bed mobility: Needs Assistance Bed Mobility: Sit to Supine     Supine to sit: Supervision        Transfers                      Balance                                           ADL either performed or assessed with clinical judgement   ADL                   Upper Body Dressing : Maximal assistance Upper Body Dressing Details (indicate cue type and reason): (to doff shoes)                   General ADL Comments: Pt. refused further adl tasks.      Vision       Perception     Praxis      Cognition  Arousal/Alertness: Awake/alert Behavior During Therapy: WFL for tasks assessed/performed Overall Cognitive Status: Within Functional Limits for tasks assessed                                          Exercises     Shoulder Instructions       General Comments      Pertinent Vitals/ Pain       Pain Assessment: 0-10 Pain Score: 7  Pain Location: neck, throat Pain Descriptors / Indicators: Sore Pain Intervention(s): Limited activity within patient's tolerance  Home Living                                          Prior Functioning/Environment              Frequency           Progress Toward Goals  OT Goals(current goals can now  be found in the care plan section)  Progress towards OT goals: Progressing toward goals     Plan Discharge plan remains appropriate    Co-evaluation                 AM-PAC OT "6 Clicks" Daily Activity     Outcome Measure   Help from another person eating meals?: Total(liquid diet) Help from another person taking care of personal grooming?: A Little Help from another person toileting, which includes using toliet, bedpan, or urinal?: A Little Help from another person bathing (including washing, rinsing, drying)?: A Little Help from another person to put on and taking off regular upper body clothing?: None Help from another person to put on and taking off regular lower body clothing?: A Lot 6 Click Score: 16    End of Session    OT Visit Diagnosis: Other abnormalities of gait and mobility (R26.89);Muscle weakness (generalized) (M62.81)   Activity Tolerance     Patient Left     Nurse Communication          Time: [id number] OT Time Calculation (min): 21 min  Charges: OT General Charges $OT Visit: 1 Visit OT Treatments $Self Care/Home Management : [id number] mins  6 clicks   Tony Berry 12/09/2018, 12:08 PM

## 2018-12-09 NOTE — Progress Notes (Signed)
[facility]:  Goals of care with wife are not in line with Hospice care at the residential Hospice unit. She states that pt is a DNR and yes if does want to go to the hospice facility but she also states that if he aspirates he wants to return to the hospital and will be ok with short term vent option but not CPR. She reports that they also would like to go home if that is an option from residential hospice if they are able to get the house fixed.  Heather with [facility] reports that the home will be fixed within 2-4 weeks possiibly. There are 10 people in front of him on the list. If they opt to go home with [facility] the pt will not be able to have both the nurse at home (which has been approved for 8-16 hours a day due to new trach) and hopice care. The wife reports that she  would prefer to have the [facility] due to the amt of hours they can offer with help to her in home.   Webb Silversmith RN

## 2018-12-09 NOTE — Progress Notes (Signed)
Daily Progress Note   Patient Name: Tony Berry       Date: 12/09/2018 DOB: [birth year]  Age: 58 y.o. MRN#: [id number] Attending Physician: Tony Merino, MD Primary Care Physician: Tony Nakai, MD Admit Date: 11/16/2018  Reason for Consultation/Follow-up: Establishing goals of care  Subjective: Tony Berry is complaining of generalized pain, pain by his throat area near his trach site.  No family present at the bedside.  Discussed with hospice colleague.  See below.  Length of Stay: 23  Current Medications: Scheduled Meds:   acetaminophen (TYLENOL) oral liquid 160 mg/5 mL  650 mg Per Tube Q6H   aspirin  81 mg Per Tube Daily   chlorhexidine  15 mL Mouth Rinse BID   enoxaparin (LOVENOX) injection  30 mg Subcutaneous Q24H   feeding supplement (JEVITY 1.5 CAL/FIBER)  237 mL Per Tube QID   feeding supplement (PRO-STAT SUGAR FREE 64)  60 mL Per Tube BID   free water  120 mL Per Tube QID   gabapentin  300 mg Per Tube Q8H   mouth rinse  15 mL Mouth Rinse q12n4p   pantoprazole sodium  40 mg Per Tube QHS   QUEtiapine  50 mg Oral QHS   senna-docusate  1 tablet Per Tube Daily    Continuous Infusions:  sodium chloride Stopped (11/20/18 0853)   sodium chloride Stopped (11/22/18 1900)    PRN Meds: sodium chloride, sodium chloride, docusate, guaiFENesin, HYDROmorphone (DILAUDID) injection, ipratropium-albuterol, LORazepam, ondansetron (ZOFRAN) IV, oxyCODONE-acetaminophen, phenol, Resource ThickenUp Clear  Physical Exam         Appears more frail Generalized pain Pain in his trach site Diminished breath sounds S1-S2 No edema Has PEG  Vital Signs: BP 104/74    Pulse 83    Temp 98.1 F (36.7 C) (Oral)    Resp 20    Ht 5\' 6"  (1.676 m)    Wt 47 kg    SpO2 95%    BMI 16.72  kg/m  SpO2: SpO2: 95 % O2 Device: O2 Device: Tracheostomy Collar O2 Flow Rate: O2 Flow Rate (L/min): 5 L/min  Intake/output summary:   Intake/Output Summary (Last 24 hours) at 12/09/2018 1446 Last data filed at 12/08/2018 1829 Gross per 24 hour  Intake 240 ml  Output 0 ml  Net 240 ml   LBM: Last BM Date:  12/07/18 Baseline Weight: Weight: 45.3 kg Most recent weight: Weight: 47 kg       Palliative Assessment/Data:    Flowsheet Rows     Most Recent Value  Intake Tab  Referral Department  Critical care  Unit at Time of Referral  ICU  Palliative Care Primary Diagnosis  Cancer  Date Notified  11/17/18  Palliative Care Type  Return patient Palliative Care  Reason for referral  Clarify Goals of Care  Date of Admission  11/16/18  Date first seen by Palliative Care  11/19/18  # of days Palliative referral response time  2 Day(s)  # of days IP prior to Palliative referral  1  Clinical Assessment  Palliative Performance Scale Score  40%  Psychosocial & Spiritual Assessment  Palliative Care Outcomes  Patient/Family meeting held?  Yes  Who was at the meeting?  spouse  Palliative Care Outcomes  Clarified goals of care, Provided psychosocial or spiritual support      Patient Active Problem List   Diagnosis Date Noted   Tracheostomy status [facility])    Palliative care encounter    Tracheostomy tube present ([place])    Endotracheally intubated    Head and neck cancer ([place])    S/P percutaneous endoscopic gastrostomy (PEG) tube placement ([place])    Cachexia ([place])    Failure to thrive in adult    Hypotension due to drugs    Acute on chronic respiratory failure with hypoxia ([place]) 11/16/2018   Encounter for imaging study to confirm orogastric (OG) tube placement    History of ETT    Palliative care by specialist    Acute on chronic respiratory failure with hypoxia and hypercapnia ([place]) 10/26/2018   Pressure injury of skin 10/26/2018   Aspiration into airway 10/26/2018     History of head and neck cancer 10/26/2018   Fall at home, initial encounter 09/28/2018   Goals of care, counseling/discussion    DNR (do not resuscitate) discussion    Chest discomfort 05/06/2018   Dysphagia 04/08/2018   Aspiration pneumonia ([place]) 04/04/2018   Acute on chronic respiratory failure ([place]) 03/15/2018   Carditis    Troponin level elevated 03/11/2018   Protein-calorie malnutrition, severe 03/11/2018   Hyponatremia 03/11/2018   Hypophosphatemia 03/11/2018   Multifocal pneumonia 03/10/2018    Palliative Care Assessment & Plan   Patient Profile:   Assessment: 58 year old gentleman with a prior history of head and neck cancer status post radiation therapy, esophageal cancer, recurrent aspiration pneumonia and COPD.  Status post ventilator dependent respiratory failure, status post tracheostomy.  Palliative medicine team following for aggressive symptom management as well as disposition arrangements.  Recommendations/Plan: Ongoing goals of care discussions with the patient.  Hospice consult was requested however it is noted that the patient would still want to be transferred from the hospice facility to the hospital in the event of respiratory failure.  This could easily happen because of aspiration since the patient desires to eat p.o. and is aware of aspiration risk.  He is refusing tube feeds at times.  He continues to complain of uncontrolled pain.  Will add IV Dilaudid low-dose and monitor.  Staff undergoing extensive discussions with the patient and his wife regarding their overall goals of care.  We will continue to support the patient with symptom management while appropriate disposition plans are worked out.  Code Status:    Code Status Orders  (From admission, onward)         Start     Ordered  11/22/18 0839  Do not attempt resuscitation (DNR)  Continuous    Question Answer Comment  In the event of cardiac or respiratory ARREST Do not call a  code blue   In the event of cardiac or respiratory ARREST Do not perform Intubation, CPR, defibrillation or ACLS   In the event of cardiac or respiratory ARREST Use medication by any route, position, wound care, and other measures to relive pain and suffering. May use oxygen, suction and manual treatment of airway obstruction as needed for comfort.      11/22/18 0838        Code Status History    Date Active Date Inactive Code Status Order ID Comments User Context   11/17/2018 0016 11/22/2018 0838 Partial Code [id number]  Kandice Hams, MD Inpatient   11/16/2018 2342 11/17/2018 0016 Full Code [id number]  Kandice Hams, MD Inpatient   10/26/2018 1946 10/31/2018 1706 Full Code [id number]  Roxanne Mins, MD Inpatient   09/29/2018 0110 10/02/2018 1245 Partial Code [id number]  Toy Baker, MD Inpatient   04/04/2018 1449 04/12/2018 1448 Partial Code [id number]  Ina Homes, MD Inpatient   04/04/2018 1242 04/04/2018 1439 DNR [id number]  Ina Homes, MD ED   03/10/2018 1806 03/15/2018 2047 DNR [id number]  Katherine Roan, MD ED   Advance Care Planning Activity    Advance Directive Documentation     Most Recent Value  Type of Advance Directive  Living will  Pre-existing out of facility DNR order (yellow form or pink MOST form)  --  "MOST" Form in Place?  --       Prognosis:  Appears markedly limited  Discharge Planning:  To Be Determined  Care plan was discussed with patient, hospice  Thank you for allowing the Palliative Medicine Team to assist in the care of this patient.   Time In: 1400 Time Out: 1435 Total Time 35 Prolonged Time Billed  no       Greater than 50%  of this time was spent counseling and coordinating care related to the above assessment and plan.  Loistine Chance, MD [id number] Please contact Palliative Medicine Team phone at [phone] for questions and concerns.

## 2018-12-09 NOTE — Progress Notes (Signed)
Palliative Medicine RN Note: Spoke with Cheri with [facility]. She is aware of the referral. Unfortunately, due to [employer] issues, outside providers are unable to access Epic to leave notes to update hospital staff.   At this time, wife's goals remain unclear per her conversation with Cheri. Pain is uncontrolled, and Dr Rowe Pavy is going to adjust the pain regimen. PMT will continue to discuss [place]. Tyan Lasure, RN, BSN, [facility] Palliative Medicine Team 12/09/2018 3:03 PM Office [phone]

## 2018-12-09 NOTE — Telephone Encounter (Signed)
Called and spoke to [place] with [facility]. Informed her of the recs per MR. She is requesting we try another provider that has seen the pt. Pt previously established Dr. Valeta Harms in the office. Since admission pt now has a trach and is needing nursing care. She is re-faxing the form.   Dr. Valeta Harms please advise if you are willing to sign this.    From phone note 9/15: 12/07/18 12:29 PM Note   Spoke with Nira Conn at [facility], states that pt has been prescribed 8-16 hrs of home nursing care from his current hospital admission.  Pt's PCP is refusing to sign the orders, and Medicaid is requires a MD signature for this order.  Pt has been seen by pulmonary in the hospital, is being sent home on a trach.  MR please advise if you're willing to sign these forms.  Thanks!

## 2018-12-09 NOTE — TOC Progression Note (Signed)
Transition of Care [facility]) - Progression Note    Patient Details  Name: Tony Berry MRN: [id number] Date of Birth: 1960-04-23  Transition of Care [facility] CM/SW [place], [place] Phone Number: 12/09/2018, 3:37 PM  Clinical Narrative:   CSW following for discharge plans. CSW has spoken with hospice of [facility], and they will not be able to take the patient in to the hospice home at this time. Patient can only be at the hospice home for two weeks, and he would also want to be brought back to the hospital for ventilator support if he were to aspirate and need that, which would involve revoking hospice. Patient also can't have home hospice because the wife will need the nursing support through [facility] and [place] will not pay for both hospice and the nurse through [facility]. Patient's option again has become SNF while waiting on the repairs to the home, which patient has no SNF bed offers at this time. CSW reached out to [facility] to see if they could take the patient, and they are reviewing. CSW to follow.    Expected Discharge Plan: Long Term Nursing Home Barriers to Discharge: Continued Medical Work up  Expected Discharge Plan and Services Expected Discharge Plan: [place]   Discharge Planning Services: CM Consult   Living arrangements for the past 2 months: Apartment                                       Social Determinants of Health (SDOH) Interventions    Readmission Risk Interventions Readmission Risk Prevention Plan 10/01/2018  Transportation Screening Complete  PCP or Specialist Appt within 3-5 Days Complete  HRI or [place] Complete  Social Work Consult for [place] Planning/Counseling Complete  Palliative Care Screening Complete  Medication Review [employer]) Complete  Some recent data might be hidden

## 2018-12-09 NOTE — Telephone Encounter (Signed)
lmtcb for [employer]. See phone note from 9/15.

## 2018-12-10 MED ORDER — QUETIAPINE FUMARATE 25 MG PO TABS
50.0000 mg | ORAL_TABLET | Freq: Every day | ORAL | Status: DC
  Administered 2018-12-10 – 2018-12-12 (×3): 50 mg
  Filled 2018-12-10 (×3): qty 2

## 2018-12-10 MED ORDER — OXYCODONE HCL 5 MG/5ML PO SOLN
20.0000 mg | Freq: Four times a day (QID) | ORAL | Status: DC
  Administered 2018-12-10 – 2018-12-13 (×11): 20 mg
  Filled 2018-12-10 (×12): qty 20

## 2018-12-10 NOTE — Progress Notes (Signed)
Daily Progress Note   Patient Name: Tony Berry       Date: 12/10/2018 DOB: [birth year]  Age: 58 y.o. MRN#: [id number] Attending Physician: Barb Merino, MD Primary Care Physician: Cher Nakai, MD Admit Date: 11/16/2018  Reason for Consultation/Follow-up: Establishing goals of care  Subjective: Mr. Daher is frustrated at being in the hospital for such a long time. He has some pain in throat at times. He is eating ice cream and tolerating some POs as per his wishes, realizing the risk of aspiration.   Call placed and also discussed with wife, also briefly discussed with CSW as well as with TRH MD.    See below.  Length of Stay: 24  Current Medications: Scheduled Meds:  . acetaminophen (TYLENOL) oral liquid 160 mg/5 mL  650 mg Per Tube Q6H  . aspirin  81 mg Per Tube Daily  . chlorhexidine  15 mL Mouth Rinse BID  . enoxaparin (LOVENOX) injection  30 mg Subcutaneous Q24H  . feeding supplement (JEVITY 1.5 CAL/FIBER)  237 mL Per Tube QID  . feeding supplement (PRO-STAT SUGAR FREE 64)  60 mL Per Tube BID  . free water  120 mL Per Tube QID  . gabapentin  300 mg Per Tube Q8H  . mouth rinse  15 mL Mouth Rinse q12n4p  . pantoprazole sodium  40 mg Per Tube QHS  . QUEtiapine  50 mg Per Tube QHS  . senna-docusate  1 tablet Per Tube Daily    Continuous Infusions: . sodium chloride Stopped (11/20/18 0853)  . sodium chloride Stopped (11/22/18 1900)    PRN Meds: sodium chloride, sodium chloride, docusate, guaiFENesin, HYDROmorphone (DILAUDID) injection, ipratropium-albuterol, LORazepam, ondansetron (ZOFRAN) IV, oxyCODONE-acetaminophen, phenol, Resource ThickenUp Clear  Physical Exam         Appears more frail Generalized pain Pain in his trach site at times.  Diminished breath sounds, few  wheezes in posterior lung fields Extremely cachectic S1-S2 No edema Has PEG  Vital Signs: BP 118/80   Pulse 90   Temp 97.8 F (36.6 C)   Resp 20   Ht 5\' 6"  (1.676 m)   Wt 47 kg   SpO2 93%   BMI 16.72 kg/m  SpO2: SpO2: 93 % O2 Device: O2 Device: Tracheostomy Collar O2 Flow Rate: O2 Flow Rate (L/min): 5 L/min  Intake/output summary:  Intake/Output Summary (Last 24 hours) at 12/10/2018 1255 Last data filed at 12/10/2018 1100 Gross per 24 hour  Intake -  Output 500 ml  Net -500 ml   LBM: Last BM Date: 12/07/18 Baseline Weight: Weight: 45.3 kg Most recent weight: Weight: 47 kg       Palliative Assessment/Data:    Flowsheet Rows     Most Recent Value  Intake Tab  Referral Department  Critical care  Unit at Time of Referral  ICU  Palliative Care Primary Diagnosis  Cancer  Date Notified  11/17/18  Palliative Care Type  Return patient Palliative Care  Reason for referral  Clarify Goals of Care  Date of Admission  11/16/18  Date first seen by Palliative Care  11/19/18  # of days Palliative referral response time  2 Day(s)  # of days IP prior to Palliative referral  1  Clinical Assessment  Palliative Performance Scale Score  40%  Psychosocial & Spiritual Assessment  Palliative Care Outcomes  Patient/Family meeting held?  Yes  Who was at the meeting?  spouse  Palliative Care Outcomes  Clarified goals of care, Provided psychosocial or spiritual support      Patient Active Problem List   Diagnosis Date Noted  . Generalized pain   . Tracheostomy status ([place])   . Palliative care encounter   . Tracheostomy tube present ([place])   . Endotracheally intubated   . Head and neck cancer ([place])   . S/P percutaneous endoscopic gastrostomy (PEG) tube placement ([place])   . Cachexia ([place])   . Failure to thrive in adult   . Hypotension due to drugs   . Acute on chronic respiratory failure with hypoxia ([place]) 11/16/2018  . Encounter for imaging study to confirm orogastric (OG)  tube placement   . History of ETT   . Palliative care by specialist   . Acute on chronic respiratory failure with hypoxia and hypercapnia ([place]) 10/26/2018  . Pressure injury of skin 10/26/2018  . Aspiration into airway 10/26/2018  . History of head and neck cancer 10/26/2018  . Fall at home, initial encounter 09/28/2018  . Goals of care, counseling/discussion   . DNR (do not resuscitate) discussion   . Chest discomfort 05/06/2018  . Dysphagia 04/08/2018  . Aspiration pneumonia ([place]) 04/04/2018  . Acute on chronic respiratory failure ([place]) 03/15/2018  . Carditis   . Troponin level elevated 03/11/2018  . Protein-calorie malnutrition, severe 03/11/2018  . Hyponatremia 03/11/2018  . Hypophosphatemia 03/11/2018  . Multifocal pneumonia 03/10/2018    Palliative Care Assessment & Plan   Patient Profile:   Assessment: 58 year old gentleman with a prior history of head and neck cancer status post radiation therapy, esophageal cancer, recurrent aspiration pneumonia and COPD.  Status post ventilator dependent respiratory failure, status post tracheostomy.  Palliative medicine team following for aggressive symptom management as well as disposition arrangements.  Recommendations/Plan: Ongoing goals of care discussions with the patient and his wife on the phone as well as with CSW and TRH MD.  Wife states residential hospice is not consistent with their goals at this time, should the patient have acute aspiration or acute resp failure, she still wants the patient to be placed on vent short term or be brought to hospital.  Her home repairs are still ongoing.  She would possibly be accepting of SNF rehab, but does not want SNF long term. Her goal is for the patient to be with her at home, whenever it is safely possible.  Offered active listening and  supportive care to both patient and wife as they try to figure out safest disposition plan that also meets their goals.  Continue current pain and non  pain symptom management medications. Monitor closely for aspiration, continue comfort feeds for now, as per patient wishes. Wife also aware of aspiration risk.   pain medication history noted, patient required 4 doses of 0.5 mg IV Dilaudid in the past 24 hours.   Code Status:    Code Status Orders  (From admission, onward)         Start     Ordered   11/22/18 0839  Do not attempt resuscitation (DNR)  Continuous    Question Answer Comment  In the event of cardiac or respiratory ARREST Do not call a "code blue"   In the event of cardiac or respiratory ARREST Do not perform Intubation, CPR, defibrillation or ACLS   In the event of cardiac or respiratory ARREST Use medication by any route, position, wound care, and other measures to relive pain and suffering. May use oxygen, suction and manual treatment of airway obstruction as needed for comfort.      11/22/18 0838        Code Status History    Date Active Date Inactive Code Status Order ID Comments User Context   11/17/2018 0016 11/22/2018 0838 Partial Code [id number]  Kandice Hams, MD Inpatient   11/16/2018 2342 11/17/2018 0016 Full Code [id number]  Kandice Hams, MD Inpatient   10/26/2018 1946 10/31/2018 1706 Full Code [id number]  Roxanne Mins, MD Inpatient   09/29/2018 0110 10/02/2018 1245 Partial Code [id number]  Toy Baker, MD Inpatient   04/04/2018 1449 04/12/2018 1448 Partial Code [id number]  Ina Homes, MD Inpatient   04/04/2018 1242 04/04/2018 1439 DNR [id number]  Ina Homes, MD ED   03/10/2018 1806 03/15/2018 2047 DNR [id number]  Katherine Roan, MD ED   Advance Care Planning Activity    Advance Directive Documentation     Most Recent Value  Type of Advance Directive  Living will  Pre-existing out of facility DNR order (yellow form or pink MOST form)  -  "MOST" Form in Place?  -       Prognosis:  Appears markedly limited  Discharge Planning:  To Be Determined  Care plan was discussed with  patient, TRH MD and CSW  Thank you for allowing the Palliative Medicine Team to assist in the care of this patient.   Time In: 10 Time Out: 10.35 Total Time 35 Prolonged Time Billed  no       Greater than 50%  of this time was spent counseling and coordinating care related to the above assessment and plan.  Loistine Chance, MD [id number] Please contact Palliative Medicine Team phone at [phone] for questions and concerns.

## 2018-12-10 NOTE — Care Management (Signed)
CSW informed CM that pt is now again refusing SNF as recommended.  CSW will enter a separate note of detailed discussion.  CM informed Zack with [employer] that pt will need trach supplies and oxygen at discharge (pt is already on [place] oxygen in the home supplied by [employer]).  CM informed Zack of barriers with home - [employer] will require a home assessment for pt, and the earliest the assessment could be completed in on Monday 9/21.  CM also spoke with Sonia Baller with [employer] [facility] out of the [place] office and was informed;  Per the [employer] - their Publishing copy met with wife and had deemed the home unsafe for [facility] to enter - agency will however initiate [facility] services as soon as the home is repaired - agencies will inform [facility] directly.  [employer] is in the process of gaining authorization for 12 hours 7 days a week for HHRN/Aide; to provide [facility] support and to cover daytime hours when wife is away from home to care for a child out of the home.  Wife if out of the home M-F during the daytime hours.  CM also discussed case with TOC supervisior and she is in agreement that no other [facility] agencies should be given a referral until home is deemed safe.  CM discussed home discharge barriers with attending including inability to set up [facility] at this time and requirements listed above for [employer].  CSW will place trach supplies application form on shadow chart for attending to complete.  TOC will follow up with [employer] on Monday to get status of home assessment and equipment referral, however [place] can not be arranged until home is repaired.

## 2018-12-10 NOTE — Telephone Encounter (Signed)
PCCM: I sent you a staff message to request more details. thanks  Garner Nash, DO [facility] 12/10/2018 7:37 AM

## 2018-12-10 NOTE — Progress Notes (Signed)
PROGRESS NOTE    Tony Berry  [id number] DOB: [birth year] DOA: 11/16/2018 PCP: Cher Nakai, MD    Brief Narrative:  58 year old gentleman with prior history of head and neck cancer status post radiation therapy, esophageal cancer, recurrent aspiration pneumonia, COPD, hypertension, noncompliant to aspiration precautions presented to ED on 8/25 unresponsive and hypoxemic requiring intubation.  And he was found to have left lower lobe opacification consistent with aspiration pneumonia and was started on broad-spectrum antibiotics.  He was extubated on 8/27  But re intubated and underwent tracheostomy on 8/30.   PT eval recommended SNF.  But patient is adamantly refusing to go to rehab and wants to go home.  Meanwhile Education officer, museum and case management working to provide home health services on discharge. PCCM changed his trach to cuffless [place] 4 on 11/30/2018.  12/08/2018: Patient first agreed for SNF then now agreed for hospice care.  He is allowed to eat after changing to DNR.    12/10/2018: Total misunderstanding about hospice care and nursing home care by patient and patient's wife.  Patient and patient's wife were in an understanding that he goes to hospice for 2 weeks and comes back to hospital as needed and then goes home after his home is fixed.  I called patient's wife to clarify her that if that is the goal of care, he should go to a skilled nursing rehab with Medicaid and patient and patient's wife were not happy with this clarification.  Discussed with Education officer, museum.    Assessment & Plan:   Principal Problem:   Acute on chronic respiratory failure with hypoxia (HCC) Active Problems:   Protein-calorie malnutrition, severe   Endotracheally intubated   Head and neck cancer (HCC)   S/P percutaneous endoscopic gastrostomy (PEG) tube placement (HCC)   Cachexia (HCC)   Failure to thrive in adult   Hypotension due to drugs   Tracheostomy tube present ([place])   Tracheostomy status  ([place])   Palliative care encounter   Generalized pain  Acute on chronic respiratory failure with hypoxia status post multiple intubation, now with trach collar: Stabilized.  Cuffless #4.  Currently using Passy-Muir valve.  Discharge arrangements pending. Completed course of antibiotics.  High aspiration risk.  Allowed to eat with patient's request to take risk.  Severe protein calorie malnutrition: Status post PEG tube placement.  Feeding as patient wishes.  He says he will only take tube feeding when he feels hungry.  He is allowed to carefully eat some ice cream that he is doing.  Anemia of chronic disease: Hemoglobin is stable.  History of head and neck cancer status post radiation: With chronic pain issues.  Was on methadone at home.  He is not on methadone program, will continue oral oxycodone.  Likes Percocet.  On IV Dilaudid in the hospital. Also uses Klonopin and Seroquel at home that he will continue.  Stage II sacral pressure injury present on admission: Wound care consulted.  Currently remains on barrier dressing.  Hypotension: Asymptomatic hypotension at nighttime.  No indication to treat.  Disposition: Skilled nursing facility versus home.  DVT prophylaxis: Lovenox subcu Code Status: DNR Family Communication: Patient's wife. Disposition Plan: Pending, medically stable.    Consultants:   PCCM  Palliative care  Procedures:   Tracheostomy, 8/30  Antimicrobials:   Completed Rocephin for 7 days   Subjective: Patient seen and examined at the bedside.  He was eating some ice cream, sitting in couch, on room air and able to communicate. He even was  able to walk in the hallway with minimal help. He was a still in impression that he can go to hospice home for 2 weeks and go home.  Objective: Vitals:   12/10/18 0418 12/10/18 0723 12/10/18 0816 12/10/18 0828  BP:   118/80   Pulse: 86 90 95   Resp: 20 20 (!) 30 16  Temp:   97.8 F (36.6 C)   TempSrc:      SpO2:  93% 93% 91%   Weight:      Height:        Intake/Output Summary (Last 24 hours) at 12/10/2018 1041 Last data filed at 12/09/2018 2231 Gross per 24 hour  Intake -  Output 0 ml  Net 0 ml   Filed Weights   12/07/18 0350 12/07/18 0800 12/08/18 0800  Weight: 50.7 kg 47.6 kg 47 kg    Examination:  General exam: Appears calm and comfortable, chronically sick looking, cachectic, Respiratory system: Clear to auscultation. Respiratory effort normal.  Lurline Idol is intact.  Passy-Muir valve present and able to talk. Cardiovascular system: S1 & S2 heard, RRR. No JVD, murmurs, rubs, gallops or clicks. No pedal edema. Gastrointestinal system: Abdomen is nondistended, soft and nontender. No organomegaly or masses felt. Normal bowel sounds heard.  PEG tube in place. Central nervous system: Alert and oriented. No focal neurological deficits. Extremities: Symmetric 5 x 5 power. Skin: No rashes, lesions or ulcers Psychiatry: Judgement and insight appear normal. Mood & affect appropriate.     Data Reviewed: I have personally reviewed following labs and imaging studies  CBC: No results for input(s): WBC, NEUTROABS, HGB, HCT, MCV, PLT in the last 168 hours. Basic Metabolic Panel: No results for input(s): NA, K, CL, CO2, GLUCOSE, BUN, CREATININE, CALCIUM, MG, PHOS in the last 168 hours. GFR: Estimated Creatinine Clearance: 67.7 mL/min (A) (by C-G formula based on SCr of 0.31 mg/dL (L)). Liver Function Tests: No results for input(s): AST, ALT, ALKPHOS, BILITOT, PROT, ALBUMIN in the last 168 hours. No results for input(s): LIPASE, AMYLASE in the last 168 hours. No results for input(s): AMMONIA in the last 168 hours. Coagulation Profile: No results for input(s): INR, PROTIME in the last 168 hours. Cardiac Enzymes: No results for input(s): CKTOTAL, CKMB, CKMBINDEX, TROPONINI in the last 168 hours. BNP (last 3 results) No results for input(s): PROBNP in the last 8760 hours. HbA1C: No results for  input(s): HGBA1C in the last 72 hours. CBG: No results for input(s): GLUCAP in the last 168 hours. Lipid Profile: No results for input(s): CHOL, HDL, LDLCALC, TRIG, CHOLHDL, LDLDIRECT in the last 72 hours. Thyroid Function Tests: No results for input(s): TSH, T4TOTAL, FREET4, T3FREE, THYROIDAB in the last 72 hours. Anemia Panel: No results for input(s): VITAMINB12, FOLATE, FERRITIN, TIBC, IRON, RETICCTPCT in the last 72 hours. Sepsis Labs: No results for input(s): PROCALCITON, LATICACIDVEN in the last 168 hours.  No results found for this or any previous visit (from the past 240 hour(s)).       Radiology Studies: No results found.      Scheduled Meds: . acetaminophen (TYLENOL) oral liquid 160 mg/5 mL  650 mg Per Tube Q6H  . aspirin  81 mg Per Tube Daily  . chlorhexidine  15 mL Mouth Rinse BID  . enoxaparin (LOVENOX) injection  30 mg Subcutaneous Q24H  . feeding supplement (JEVITY 1.5 CAL/FIBER)  237 mL Per Tube QID  . feeding supplement (PRO-STAT SUGAR FREE 64)  60 mL Per Tube BID  . free water  120 mL  Per Tube QID  . gabapentin  300 mg Per Tube Q8H  . mouth rinse  15 mL Mouth Rinse q12n4p  . pantoprazole sodium  40 mg Per Tube QHS  . QUEtiapine  50 mg Per Tube QHS  . senna-docusate  1 tablet Per Tube Daily   Continuous Infusions: . sodium chloride Stopped (11/20/18 0853)  . sodium chloride Stopped (11/22/18 1900)     LOS: 24 days    Time spent: 25 minutes    Barb Merino, MD [facility] Pager [phone]  If 7PM-7AM, please contact night-coverage [email or web address] Password [facility] 12/10/2018, 10:41 AM

## 2018-12-11 NOTE — Progress Notes (Signed)
Patient continues to take gauze out from behind trach, No distress noted.

## 2018-12-11 NOTE — TOC Progression Note (Signed)
Transition of Care [facility]) - Progression Note    Patient Details  Name: Tony Berry MRN: [id number] Date of Birth: [birth year]  Transition [facility]) CM/SW [place], [place] Phone Number: 12/11/2018, 10:07 AM  Clinical Narrative:   CSW following. CSW received update from [facility] that they are unable to take the patient due to not having nursing ability to take on another trach patient. CSW updated by MD that patient and wife now refusing SNF and upset that hospice can't take the patient. CSW spoke with patient and wife this afternoon to discuss discharge plans. Wife upset during conversation, yelling at [place] as CSW explained discharge options. Wife and patient are refusing to wait for the house to get fixed to get nursing set up, they both want the patient to go home as soon as possible no matter what. CSW reiterated to both patient and wife that a safe discharge would be after the house is fixed and a nurse can be with the patient to ensure proper trach care, as [facility] will not send a nurse to the house in its current condition, so patient would be going home without nursing support for 2 weeks to a month. Patient and wife continue to refuse to wait. Wife asking about trach supplies, and CSW contacted RNCM for assistance in ordering trach supplies for home. CSW updated MD about patient's refusing to wait for a nurse, asked if MD was comfortable discharging him home without a nurse to assist with trach care. If patient is discharged home, it will be without home health and there will be periods of time where the patient will be home alone because the wife will be out of the house working.     Expected Discharge Plan: Long Term Nursing Home Barriers to Discharge: Continued Medical Work up  Expected Discharge Plan and Services Expected Discharge Plan: [place]   Discharge Planning Services: CM Consult   Living arrangements for the past 2 months: Apartment                               Social Determinants of Health (SDOH) Interventions    Readmission Risk Interventions Readmission Risk Prevention Plan 10/01/2018  Transportation Screening Complete  PCP or Specialist Appt within 3-5 Days Complete  HRI or [place] Complete  Social Work Consult for [place] Planning/Counseling Complete  Palliative Care Screening Complete  Medication Review Press photographer) Complete  Some recent data might be hidden

## 2018-12-11 NOTE — Progress Notes (Signed)
PROGRESS NOTE    Tony Berry  [id number] DOB: Jun 06, 1960 DOA: 11/16/2018 PCP: Cher Nakai, MD    Brief Narrative:  58 year old gentleman with prior history of head and neck cancer status post radiation therapy, esophageal cancer, recurrent aspiration pneumonia, COPD, hypertension, noncompliant to aspiration precautions presented to ED on 8/25 unresponsive and hypoxemic requiring intubation.  And he was found to have left lower lobe opacification consistent with aspiration pneumonia and was started on broad-spectrum antibiotics.  He was extubated on 8/27  But re intubated and underwent tracheostomy on 8/30.   PT eval recommended SNF.  But patient is adamantly refusing to go to rehab and wants to go home.  Meanwhile Education officer, museum and case management working to provide home health services on discharge. PCCM changed his trach to cuffless Shiley 4 on 11/30/2018.  12/08/2018: It has been difficult disposition for this patient.  Initially agreed for SNF, then decided hospice and now wants to go home.  He is mobility is not a problem but tracheostomy care is the problem.   Assessment & Plan:   Principal Problem:   Acute on chronic respiratory failure with hypoxia (HCC) Active Problems:   Protein-calorie malnutrition, severe   Endotracheally intubated   Head and neck cancer (HCC)   S/P percutaneous endoscopic gastrostomy (PEG) tube placement (HCC)   Cachexia (HCC)   Failure to thrive in adult   Hypotension due to drugs   Tracheostomy tube present ([place])   Tracheostomy status ([place])   Palliative care encounter   Generalized pain  Acute on chronic respiratory failure with hypoxia status post multiple intubation, now with trach collar: Stabilized.  Cuffless #4.  Currently using Passy-Muir valve.  Discharge arrangements pending. Completed course of antibiotics.  High aspiration risk.  Allowed to eat with patient's request to take risk.  Severe protein calorie malnutrition: Status post PEG tube  placement.  Feeding as patient wishes.  He says he will only take tube feeding when he feels hungry.  He is allowed to carefully eat some ice cream that he is doing.  Anemia of chronic disease: Hemoglobin is stable.  History of head and neck cancer status post radiation: With chronic pain issues.  Was on methadone at home.  He is not on methadone program, will continue oral oxycodone.  Likes Percocet.  On IV Dilaudid in the hospital. Also uses Klonopin and Seroquel at home that he will continue.  Stage II sacral pressure injury present on admission: Wound care consulted.  Currently remains on barrier dressing.  Hypotension: Asymptomatic hypotension.  No indication to treat.  Disposition: Skilled nursing facility versus home with home care when arrangements are made.   DVT prophylaxis: Lovenox subcu Code Status: DNR Family Communication: Patient's wife. Disposition Plan: Pending, medically stable.    Consultants:   PCCM  Palliative care  Procedures:   Tracheostomy, 8/30  Antimicrobials:   Completed Rocephin for 7 days   Subjective: Patient seen and examined.  No overnight events.  Denies any complaints.  He is looking forward to go home by Monday if arrangements are made.  Objective: Vitals:   12/10/18 2300 12/11/18 0316 12/11/18 0707 12/11/18 0735  BP:    119/76  Pulse:  72 78 83  Resp:  18 20   Temp:    98 F (36.7 C)  TempSrc:    Oral  SpO2: 94% 96% 94% 95%  Weight:      Height:        Intake/Output Summary (Last 24 hours) at 12/11/2018  [place] filed at 12/10/2018 2100 Gross per 24 hour  Intake 236 ml  Output 500 ml  Net -264 ml   Filed Weights   12/07/18 0350 12/07/18 0800 12/08/18 0800  Weight: 50.7 kg 47.6 kg 47 kg    Examination:  General exam: Appears calm and comfortable, chronically sick looking, cachectic, Respiratory system: Clear to auscultation. Respiratory effort normal.  Lurline Idol is intact.  Passy-Muir valve present and able to talk.  Cardiovascular system: S1 & S2 heard, RRR. No JVD, murmurs, rubs, gallops or clicks. No pedal edema. Gastrointestinal system: Abdomen is nondistended, soft and nontender. No organomegaly or masses felt. Normal bowel sounds heard.  PEG tube in place. Central nervous system: Alert and oriented. No focal neurological deficits. Extremities: Symmetric 5 x 5 power. Skin: No rashes, lesions or ulcers Psychiatry: Judgement and insight appear normal. Mood & affect appropriate.     Data Reviewed: I have personally reviewed following labs and imaging studies  CBC: No results for input(s): WBC, NEUTROABS, HGB, HCT, MCV, PLT in the last 168 hours. Basic Metabolic Panel: No results for input(s): NA, K, CL, CO2, GLUCOSE, BUN, CREATININE, CALCIUM, MG, PHOS in the last 168 hours. GFR: Estimated Creatinine Clearance: 67.7 mL/min (A) (by C-G formula based on SCr of 0.31 mg/dL (L)). Liver Function Tests: No results for input(s): AST, ALT, ALKPHOS, BILITOT, PROT, ALBUMIN in the last 168 hours. No results for input(s): LIPASE, AMYLASE in the last 168 hours. No results for input(s): AMMONIA in the last 168 hours. Coagulation Profile: No results for input(s): INR, PROTIME in the last 168 hours. Cardiac Enzymes: No results for input(s): CKTOTAL, CKMB, CKMBINDEX, TROPONINI in the last 168 hours. BNP (last 3 results) No results for input(s): PROBNP in the last 8760 hours. HbA1C: No results for input(s): HGBA1C in the last 72 hours. CBG: No results for input(s): GLUCAP in the last 168 hours. Lipid Profile: No results for input(s): CHOL, HDL, LDLCALC, TRIG, CHOLHDL, LDLDIRECT in the last 72 hours. Thyroid Function Tests: No results for input(s): TSH, T4TOTAL, FREET4, T3FREE, THYROIDAB in the last 72 hours. Anemia Panel: No results for input(s): VITAMINB12, FOLATE, FERRITIN, TIBC, IRON, RETICCTPCT in the last 72 hours. Sepsis Labs: No results for input(s): PROCALCITON, LATICACIDVEN in the last 168 hours.   No results found for this or any previous visit (from the past 240 hour(s)).       Radiology Studies: No results found.      Scheduled Meds: . acetaminophen (TYLENOL) oral liquid 160 mg/5 mL  650 mg Per Tube Q6H  . aspirin  81 mg Per Tube Daily  . chlorhexidine  15 mL Mouth Rinse BID  . enoxaparin (LOVENOX) injection  30 mg Subcutaneous Q24H  . feeding supplement (JEVITY 1.5 CAL/FIBER)  237 mL Per Tube QID  . feeding supplement (PRO-STAT SUGAR FREE 64)  60 mL Per Tube BID  . free water  120 mL Per Tube QID  . gabapentin  300 mg Per Tube Q8H  . mouth rinse  15 mL Mouth Rinse q12n4p  . oxyCODONE  20 mg Per Tube Q6H  . pantoprazole sodium  40 mg Per Tube QHS  . QUEtiapine  50 mg Per Tube QHS  . senna-docusate  1 tablet Per Tube Daily   Continuous Infusions: . sodium chloride Stopped (11/20/18 0853)  . sodium chloride Stopped (11/22/18 1900)     LOS: 25 days    Time spent: 25 minutes    Barb Merino, MD [facility] Pager [phone]  If  7PM-7AM, please contact night-coverage [email or web address] Password [facility] 12/11/2018, 10:28 AM

## 2018-12-12 MED ORDER — PROMETHAZINE HCL 25 MG/ML IJ SOLN
12.5000 mg | Freq: Four times a day (QID) | INTRAMUSCULAR | Status: DC | PRN
  Administered 2018-12-12: 12.5 mg via INTRAVENOUS
  Filled 2018-12-12: qty 1

## 2018-12-12 NOTE — Progress Notes (Signed)
PROGRESS NOTE    Tony Berry  [id number] DOB: 1960/06/28 DOA: 11/16/2018 PCP: Cher Nakai, MD    Brief Narrative:  58 year old gentleman with prior history of head and neck cancer status post radiation therapy, esophageal cancer, recurrent aspiration pneumonia, COPD, hypertension, noncompliant to aspiration precautions presented to ED on 8/25 unresponsive and hypoxemic requiring intubation.  And he was found to have left lower lobe opacification consistent with aspiration pneumonia and was started on broad-spectrum antibiotics.  He was extubated on 8/27  But re intubated and underwent tracheostomy on 8/30.   PT eval recommended SNF.  But patient is adamantly refusing to go to rehab and wants to go home.  Meanwhile Education officer, museum and case management working to provide home health services on discharge. PCCM changed his trach to cuffless Shiley 4 on 11/30/2018.  12/08/2018: It has been difficult disposition for this patient.  Initially agreed for SNF, then decided hospice and now wants to go home.  He is mobility is not a problem but tracheostomy care is the problem.   Assessment & Plan:   Principal Problem:   Acute on chronic respiratory failure with hypoxia (HCC) Active Problems:   Protein-calorie malnutrition, severe   Endotracheally intubated   Head and neck cancer (HCC)   S/P percutaneous endoscopic gastrostomy (PEG) tube placement (HCC)   Cachexia (HCC)   Failure to thrive in adult   Hypotension due to drugs   Tracheostomy tube present ([place])   Tracheostomy status ([place])   Palliative care encounter   Generalized pain  Acute on chronic respiratory failure with hypoxia status post multiple intubation, now with trach collar: Stabilized.  Cuffless #4.  Currently using Passy-Muir valve.  Discharge arrangements pending. Completed course of antibiotics.  High aspiration risk.  Allowed to eat with patient's request to take risk.  Severe protein calorie malnutrition: Status post PEG tube  placement.  Feeding as patient wishes.  He says he will only take tube feeding when he feels hungry.  He is allowed to carefully eat some ice cream that he is doing.  Anemia of chronic disease: Hemoglobin is stable.  History of head and neck cancer status post radiation: With chronic pain issues.  Was on methadone at home.  He is not on methadone program, will continue oral oxycodone.  Likes Percocet.  On IV Dilaudid in the hospital. Also uses Klonopin and Seroquel at home that he will continue.  Stage II sacral pressure injury present on admission: Wound care consulted.  Currently remains on barrier dressing.  Hypotension: Asymptomatic hypotension.  No indication to treat.  Disposition: Skilled nursing facility versus home with home care when arrangements are made.   DVT prophylaxis: Lovenox subcu Code Status: DNR Family Communication: none  Disposition Plan: Pending home arrangements.  Medically stable.    Consultants:   PCCM  Palliative care  Procedures:   Tracheostomy, 8/30  Antimicrobials:   Completed Rocephin for 7 days   Subjective: Patient seen and examined.  No overnight events.  Denies any complaints.  He is looking forward to go home  if arrangements are made by tomorrow.  Objective: Vitals:   12/12/18 0418 12/12/18 0441 12/12/18 0728 12/12/18 0811  BP:   113/70   Pulse: 76  97 100  Resp: 18   19  Temp:   98.1 F (36.7 C)   TempSrc:   Oral   SpO2: 95%  94% 94%  Weight:  47 kg    Height:       No intake or  output data in the 24 hours ending 12/12/18 1035 Filed Weights   12/07/18 0800 12/08/18 0800 12/12/18 0441  Weight: 47.6 kg 47 kg 47 kg    Examination:  General exam: Appears calm and comfortable, chronically sick looking, cachectic, Respiratory system: Clear to auscultation. Respiratory effort normal.  Lurline Idol is intact.  Passy-Muir valve present and able to talk. Cardiovascular system: S1 & S2 heard, RRR. No JVD, murmurs, rubs, gallops or  clicks. No pedal edema. Gastrointestinal system: Abdomen is nondistended, soft and nontender. No organomegaly or masses felt. Normal bowel sounds heard.  PEG tube in place. Central nervous system: Alert and oriented. No focal neurological deficits. Extremities: Symmetric 5 x 5 power. Skin: No rashes, lesions or ulcers Psychiatry: Judgement and insight appear normal. Mood & affect appropriate.     Data Reviewed: I have personally reviewed following labs and imaging studies  CBC: No results for input(s): WBC, NEUTROABS, HGB, HCT, MCV, PLT in the last 168 hours. Basic Metabolic Panel: No results for input(s): NA, K, CL, CO2, GLUCOSE, BUN, CREATININE, CALCIUM, MG, PHOS in the last 168 hours. GFR: Estimated Creatinine Clearance: 67.7 mL/min (A) (by C-G formula based on SCr of 0.31 mg/dL (L)). Liver Function Tests: No results for input(s): AST, ALT, ALKPHOS, BILITOT, PROT, ALBUMIN in the last 168 hours. No results for input(s): LIPASE, AMYLASE in the last 168 hours. No results for input(s): AMMONIA in the last 168 hours. Coagulation Profile: No results for input(s): INR, PROTIME in the last 168 hours. Cardiac Enzymes: No results for input(s): CKTOTAL, CKMB, CKMBINDEX, TROPONINI in the last 168 hours. BNP (last 3 results) No results for input(s): PROBNP in the last 8760 hours. HbA1C: No results for input(s): HGBA1C in the last 72 hours. CBG: No results for input(s): GLUCAP in the last 168 hours. Lipid Profile: No results for input(s): CHOL, HDL, LDLCALC, TRIG, CHOLHDL, LDLDIRECT in the last 72 hours. Thyroid Function Tests: No results for input(s): TSH, T4TOTAL, FREET4, T3FREE, THYROIDAB in the last 72 hours. Anemia Panel: No results for input(s): VITAMINB12, FOLATE, FERRITIN, TIBC, IRON, RETICCTPCT in the last 72 hours. Sepsis Labs: No results for input(s): PROCALCITON, LATICACIDVEN in the last 168 hours.  No results found for this or any previous visit (from the past 240 hour(s)).        Radiology Studies: No results found.      Scheduled Meds: . acetaminophen (TYLENOL) oral liquid 160 mg/5 mL  650 mg Per Tube Q6H  . aspirin  81 mg Per Tube Daily  . chlorhexidine  15 mL Mouth Rinse BID  . enoxaparin (LOVENOX) injection  30 mg Subcutaneous Q24H  . feeding supplement (JEVITY 1.5 CAL/FIBER)  237 mL Per Tube QID  . feeding supplement (PRO-STAT SUGAR FREE 64)  60 mL Per Tube BID  . free water  120 mL Per Tube QID  . gabapentin  300 mg Per Tube Q8H  . mouth rinse  15 mL Mouth Rinse q12n4p  . oxyCODONE  20 mg Per Tube Q6H  . pantoprazole sodium  40 mg Per Tube QHS  . QUEtiapine  50 mg Per Tube QHS  . senna-docusate  1 tablet Per Tube Daily   Continuous Infusions: . sodium chloride Stopped (11/20/18 0853)  . sodium chloride Stopped (11/22/18 1900)     LOS: 26 days    Time spent: 15 minutes    Barb Merino, MD [facility] Pager [phone]  If 7PM-7AM, please contact night-coverage [email or web address] Password [email or web address] 12/12/2018, 10:35 AM

## 2018-12-12 NOTE — Consult Note (Signed)
[facility] Nurse wound consult note Reason for Consult: right buttock healing full thickness wound.  Wound type: Stage 3 Pressure Injury POA: Yes Measurement:2.5cm x 0.4cm x 0.2cm Wound [id number], moist Drainage (amount, consistency, odor) none Periwound:intat with evidence of wound healing Dressing procedure/placement/frequency: Wound is clean and healing.  Twice daily application of our house moisture barrier ointment will provide protection and hydration of wound needed to complete tissue repair and regeneration. Patient is now ambulatory.    [facility] nursing team will not follow, but will remain available to this patient, the nursing and medical teams.  Please re-consult if needed. Thanks, Maudie Flakes, MSN, RN, [place], [facility]  Pager# [phone]

## 2018-12-13 MED ORDER — OXYCODONE-ACETAMINOPHEN 5-325 MG PO TABS: 1.0000 | ORAL_TABLET | ORAL | 0 refills | Status: AC | PRN

## 2018-12-13 MED ORDER — QUETIAPINE FUMARATE 50 MG PO TABS: 50.0000 mg | ORAL_TABLET | Freq: Every day | ORAL | 0 refills | Status: DC

## 2018-12-13 MED ORDER — GABAPENTIN 250 MG/5ML PO SOLN: 300.0000 mg | Freq: Three times a day (TID) | ORAL | 0 refills | Status: DC

## 2018-12-13 MED ORDER — FREE WATER: 120.0000 mL | Freq: Four times a day (QID) | Status: AC

## 2018-12-13 NOTE — Progress Notes (Signed)
Daily Progress Note   Patient Name: Tony Berry       Date: 12/13/2018 DOB: April 02, 1960  Age: 58 y.o. MRN#: [id number] Attending Physician: Barb Merino, MD Primary Care Physician: Cher Nakai, MD Admit Date: 11/16/2018  Reason for Consultation/Follow-up: Establishing goals of care  Subjective: [name] he is eager to get out of the hospital, still has some pain around his trach site, but is manageable on current regimen. His main concern is discharge. Reasonable PO intake.     See below.  Length of Stay: 27  Current Medications: Scheduled Meds:  . acetaminophen (TYLENOL) oral liquid 160 mg/5 mL  650 mg Per Tube Q6H  . aspirin  81 mg Per Tube Daily  . chlorhexidine  15 mL Mouth Rinse BID  . enoxaparin (LOVENOX) injection  30 mg Subcutaneous Q24H  . feeding supplement (JEVITY 1.5 CAL/FIBER)  237 mL Per Tube QID  . feeding supplement (PRO-STAT SUGAR FREE 64)  60 mL Per Tube BID  . free water  120 mL Per Tube QID  . gabapentin  300 mg Per Tube Q8H  . mouth rinse  15 mL Mouth Rinse q12n4p  . oxyCODONE  20 mg Per Tube Q6H  . pantoprazole sodium  40 mg Per Tube QHS  . QUEtiapine  50 mg Per Tube QHS  . senna-docusate  1 tablet Per Tube Daily    Continuous Infusions: . sodium chloride Stopped (11/20/18 0853)  . sodium chloride Stopped (11/22/18 1900)    PRN Meds: sodium chloride, sodium chloride, docusate, guaiFENesin, HYDROmorphone (DILAUDID) injection, ipratropium-albuterol, LORazepam, ondansetron (ZOFRAN) IV, phenol, promethazine, Resource ThickenUp Clear  Physical Exam         Appears  frail Generalized pain Pain in his trach site at times.  Diminished breath sounds, few wheezes in posterior lung fields Extremely cachectic S1-S2 No edema Has PEG  Vital Signs: BP  116/60 (BP Location: Left Arm)   Pulse (!) 101   Temp 98.2 F (36.8 C) (Oral)   Resp 18   Ht 5\' 6"  (1.676 m)   Wt 47 kg   SpO2 93%   BMI 16.72 kg/m  SpO2: SpO2: 93 % O2 Device: O2 Device: Room Air(patient did not have AIR ATC on at this time) O2 Flow Rate: O2 Flow Rate (L/min): 5 L/min  Intake/output summary:  Intake/Output Summary (Last 24 hours) at 12/13/2018 1011 Last data filed at 12/13/2018 0809 Gross per 24 hour  Intake 237 ml  Output -  Net 237 ml   LBM: Last BM Date: 12/10/18 Baseline Weight: Weight: 45.3 kg Most recent weight: Weight: 47 kg       Palliative Assessment/Data:    Flowsheet Rows     Most Recent Value  Intake Tab  Referral Department  Critical care  Unit at Time of Referral  ICU  Palliative Care Primary Diagnosis  Cancer  Date Notified  11/17/18  Palliative Care Type  Return patient Palliative Care  Reason for referral  Clarify Goals of Care  Date of Admission  11/16/18  Date first seen by Palliative Care  11/19/18  # of days Palliative referral response time  2 Day(s)  # of days IP prior to Palliative referral  1  Clinical Assessment  Palliative Performance Scale Score  40%  Psychosocial & Spiritual Assessment  Palliative Care Outcomes  Patient/Family meeting held?  Yes  Who was at the meeting?  spouse  Palliative Care Outcomes  Clarified goals of care, Provided psychosocial or spiritual support      Patient Active Problem List   Diagnosis Date Noted  . Generalized pain   . Tracheostomy status ([place])   . Palliative care encounter   . Tracheostomy tube present ([place])   . Endotracheally intubated   . Head and neck cancer ([place])   . S/P percutaneous endoscopic gastrostomy (PEG) tube placement ([place])   . Cachexia ([place])   . Failure to thrive in adult   . Hypotension due to drugs   . Acute on chronic respiratory failure with hypoxia ([place]) 11/16/2018  . Encounter for imaging study to confirm orogastric (OG) tube placement   . History of  ETT   . Palliative care by specialist   . Acute on chronic respiratory failure with hypoxia and hypercapnia ([place]) 10/26/2018  . Pressure injury of skin 10/26/2018  . Aspiration into airway 10/26/2018  . History of head and neck cancer 10/26/2018  . Fall at home, initial encounter 09/28/2018  . Goals of care, counseling/discussion   . DNR (do not resuscitate) discussion   . Chest discomfort 05/06/2018  . Dysphagia 04/08/2018  . Aspiration pneumonia ([place]) 04/04/2018  . Acute on chronic respiratory failure ([place]) 03/15/2018  . Carditis   . Troponin level elevated 03/11/2018  . Protein-calorie malnutrition, severe 03/11/2018  . Hyponatremia 03/11/2018  . Hypophosphatemia 03/11/2018  . Multifocal pneumonia 03/10/2018    Palliative Care Assessment & Plan   Patient Profile:   Assessment: 58 year old gentleman with a prior history of head and neck cancer status post radiation therapy, esophageal cancer, recurrent aspiration pneumonia and COPD.  Status post ventilator dependent respiratory failure, status post tracheostomy.  Palliative medicine team following for aggressive symptom management as well as disposition arrangements.  Recommendations/Plan: Continue current pain regimen.  Patient likely to be d/c once a safe reasonable disposition is worked out, would like to be home with family.    Code Status:    Code Status Orders  (From admission, onward)         Start     Ordered   11/22/18 0839  Do not attempt resuscitation (DNR)  Continuous    Question Answer Comment  In the event of cardiac or respiratory ARREST Do not call a "code blue"   In the event of cardiac or respiratory ARREST Do not perform Intubation, CPR, defibrillation or ACLS   In  the event of cardiac or respiratory ARREST Use medication by any route, position, wound care, and other measures to relive pain and suffering. May use oxygen, suction and manual treatment of airway obstruction as needed for comfort.       11/22/18 0838        Code Status History    Date Active Date Inactive Code Status Order ID Comments User Context   11/17/2018 0016 11/22/2018 0838 Partial Code [id number]  Kandice Hams, MD Inpatient   11/16/2018 2342 11/17/2018 0016 Full Code [id number]  Kandice Hams, MD Inpatient   10/26/2018 1946 10/31/2018 1706 Full Code [id number]  Roxanne Mins, MD Inpatient   09/29/2018 0110 10/02/2018 1245 Partial Code [id number]  Toy Baker, MD Inpatient   04/04/2018 1449 04/12/2018 1448 Partial Code [id number]  Ina Homes, MD Inpatient   04/04/2018 1242 04/04/2018 1439 DNR [id number]  Ina Homes, MD ED   03/10/2018 1806 03/15/2018 2047 DNR [id number]  Katherine Roan, MD ED   Advance Care Planning Activity    Advance Directive Documentation     Most Recent Value  Type of Advance Directive  Living will  Pre-existing out of facility DNR order (yellow form or pink MOST form)  -  "MOST" Form in Place?  -       Prognosis:  Appears markedly limited  Discharge Planning:  To Be Determined  Care plan was discussed with patient and bedside RN.   Thank you for allowing the Palliative Medicine Team to assist in the care of this patient.   Time In: 9 Time Out: 9.15 Total Time 15 Prolonged Time Billed  no       Greater than 50%  of this time was spent counseling and coordinating care related to the above assessment and plan.  Loistine Chance, MD [id number] Please contact Palliative Medicine Team phone at [phone] for questions and concerns.

## 2018-12-13 NOTE — Progress Notes (Signed)
Patient stated he is "ready to go home". Patient stated him and his wife would be the ones suctioning and taking care of trach needs. RT stressed and explained the importance of regular cleaning of the trach and inner cannula. RT also stated to keep dressing dry and clean around trach. Patient stated he understood the importance. RT will continue to monitor until discharge.

## 2018-12-13 NOTE — Progress Notes (Signed)
Spoke with RN about trach education for patient. Patient needs to have a family member at bedside to go over trach education/care as well as patient. Once family member is at bedside RT will continue with trach education and care.

## 2018-12-13 NOTE — Care Management (Addendum)
CM spoke with pt directly this am with the assistance of bedside nurse.  Pt remains adamant regarding discharging home without safety measures of both HH and needed equipment.  CM again explained the barriers with both HH and DME and that [employer] will require a home assessment prior to accepting referral for home trach supplies and necessary equipment.   CM followed up with [employer] this am and home assessment has not yet been scheduled - CSW to get respiratory therapist to complete trach supply application form and then provide to [employer] today.    Pt stated he wants to discharge home regardless and supplies can be delivered once [facility] completes assessment post discharge.  CM again reiterated the safely concerns with pts plans to discharge home.  Bedside nurse to follow up with attending; attending aware of barriers with discharge plan.   Update:  Pt has been cleared for discharge home today with above barriers.   Per pts wife his brother will pick him up from hosptial and transport home - brother will bring portable oxygen tank from [employer] for transport home.  Pt is on 5 liters baseline and wife confrims the oxygen equipment is working in the home. [facility] aware pt will discharge home today, agency working on trach supplies delivery.  [facility] will no longer perform assessment of pts home tomorrow as per attending pt no longer needs any additional oxygen/equipment other than trach care cleaning supplies.  [facility] informed that pt will discharge home today and will continue to follow - again agency will not provide [place] until house is repaired.  [facility] confirms they provided tube feedings TPA, wife confirms that she has ample supply,neither formula nor dose has changed from PTA. Wife in agreement for CM to make PCP follow up appt and inform that pt now has a trach.   CM made earliest appt available with PCP - placed on AVS.  [place] bedside nurse to ensure pt has 5 day supply of trach supplies to take home with him from hospital -  this will allow the delivery time from [employer].  [facility] will provide sutioning equipment to bedside prior to pt leaving [facility].

## 2018-12-13 NOTE — Progress Notes (Signed)
Spoke with patients wife, she states she and son had been to hospital 9/12 at 1400 and received trach care training by respiratory therapist.  Wife was able to walk through the steps for cleaning and inner canula cleaning and or change out.  Wife was also able to state actions to take in case of respiratory distress or trach occlusion.

## 2018-12-13 NOTE — Discharge Summary (Signed)
Physician Discharge Summary  Tony Berry [id number] DOB: 1960/09/23 DOA: 11/16/2018  PCP: Cher Nakai, MD  Admit date: 11/16/2018 Discharge date: 12/13/2018  Admitted From: home  Disposition:  Home   Recommendations for Outpatient Follow-up:  1. Follow up with PCP within one week.    Home Health:NA  Equipment/Devices:trach supplies and suction.   Discharge Condition:fair   CODE STATUS: DNR Diet recommendation: Thickened liquids, all-time aspiration precautions and tube feeding.  Discharge summary: 58 year old gentleman with history of head and neck cancer status post radiation therapy, esophageal cancer, recurrent aspiration pneumonia, COPD, hypertension and chronic PEG tube, noncompliance to aspiration precautions presented to [facility] emergency room on 8/25, unresponsive and hypoxemic requiring intubation.  He was transferred to [facility], ICU.  Also found to have left lower lobe opacification consistent with aspiration pneumonia and was treated with broad-spectrum antibiotics.  He was extubated on 8/27, reintubated and underwent tracheostomy on 8/30.  Acute on chronic respiratory failure with hypoxia, status post multiple intubation and extubation, now on trach collar: Stabilized.  Cuffless size 4.  Currently using Passy-Muir valve.  Patient on room air most of the time.  Very high aspiration risk.  Patient will eat anyway.  Patient has been waiting to go to skilled nursing facility, then decided to go home and now with no mobility issues. Patient and family has been trained to take care of tracheostomy, they are comfortable, patient is walking along the hallway on room air without distress.  Patient has a PEG tube and uses it for nutrition as he wishes. He does have pain problems including throat and neck pain, PEG site pain.  He does have chronic cancer.  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  Patient is adequately stabilized.  At one point they also were interested to talk with hospice program, however they were not ready to be treated for end-of-life care.  Patient was suggested to go to a skilled nursing facility as he did not have enough support at home in the daytime, however, patient and family insisted that they are safe at home.  Discharge Diagnoses:  Principal Problem:   Acute on chronic respiratory failure with hypoxia (HCC) Active Problems:   Protein-calorie malnutrition, severe   Endotracheally intubated   Head and neck cancer (HCC)   S/P percutaneous endoscopic gastrostomy (PEG) tube placement (HCC)   Cachexia (HCC)   Failure to thrive in adult   Hypotension due to drugs   Tracheostomy tube present ([place])   Tracheostomy status ([place])   Palliative care encounter   Generalized pain    Discharge Instructions  Discharge Instructions    Call MD for:  difficulty breathing, headache or visual disturbances   Complete by: As directed    Call MD for:  persistant nausea and vomiting   Complete by: As directed    Call MD for:  temperature >100.4   Complete by: As directed    Diet - low sodium heart healthy   Complete by: As directed    Discharge instructions   Complete by: As directed    Tracheostomy care as instructed. Keep it clean and dry.   Increase activity slowly   Complete by: As directed      Allergies as of 12/13/2018   No Known Allergies     Medication List  STOP taking these medications   amoxicillin-clavulanate 875-125 MG tablet Commonly known as: Augmentin   clopidogrel 75 MG tablet Commonly known as: PLAVIX   METHADONE HCL PO   Resource ThickenUp Clear Powd   Stiolto Respimat 2.5-2.5 MCG/ACT Aers Generic drug: Tiotropium Bromide-Olodaterol     TAKE these medications   albuterol (2.5 MG/3ML) 0.083% nebulizer solution Commonly  known as: PROVENTIL Take 2.5 mg by nebulization every 6 (six) hours as needed for wheezing or shortness of breath.   albuterol 108 (90 Base) MCG/ACT inhaler Commonly known as: VENTOLIN HFA Inhale 2 puffs into the lungs every 6 (six) hours as needed for wheezing or shortness of breath.   Anoro Ellipta 62.5-25 MCG/INH Aepb Generic drug: umeclidinium-vilanterol Inhale 1 puff into the lungs daily.   aspirin 81 MG chewable tablet Chew 1 tablet (81 mg total) by mouth daily. Please do not take for five days before your gastrostomy tube placement procedure What changed:   how to take this  additional instructions   feeding supplement (JEVITY 1.2 CAL) Liqd Place 474-711 mLs into feeding tube 2 (two) times daily. 2-3 cans twice daily   free water Soln Place 120 mLs into feeding tube 4 (four) times daily.   furosemide 20 MG tablet Commonly known as: LASIX Take 1 tablet (20 mg total) by mouth 2 (two) times daily. What changed:   how to take this  when to take this  reasons to take this   gabapentin 250 MG/5ML solution Commonly known as: NEURONTIN Place 6 mLs (300 mg total) into feeding tube every 8 (eight) hours.   ondansetron 4 MG tablet Commonly known as: ZOFRAN Place 4 mg into feeding tube every 8 (eight) hours as needed for nausea or vomiting.   oxyCODONE-acetaminophen 5-325 MG tablet Commonly known as: Percocet Take 1 tablet by mouth every 4 (four) hours as needed for up to 5 days for severe pain.   OXYGEN Inhale 5 L into the lungs continuous.   Perforomist 20 MCG/2ML nebulizer solution Generic drug: formoterol Take 20 mcg by nebulization 2 (two) times daily.   polyethylene glycol 17 g packet Commonly known as: MIRALAX / GLYCOLAX Take 17 g by mouth daily.   QUEtiapine 50 MG tablet Commonly known as: SEROQUEL Place 1 tablet (50 mg total) into feeding tube at bedtime.   senna-docusate 8.6-50 MG tablet Commonly known as: Senokot-S Place 1 tablet into feeding  tube daily.   sodium chloride 0.65 % Soln nasal spray Commonly known as: OCEAN Place 1 spray into both nostrils as needed for congestion.   sodium chloride 1 g tablet Take 1 tablet (1 g total) by mouth 2 (two) times daily with a meal. What changed: how to take this      Follow-up Information    [facility]. Call.   Specialty: Pulmonology Why: call prior to discharge for f/u appt with trach clinic 1 week following d/c  Contact information: [place] SSN-[id number] [phone]         No Known Allergies  Consultations:  PCCM   Procedures/Studies: Dg Chest 1 View  Result Date: 11/21/2018 CLINICAL DATA:  Respiratory failure. Head and neck cancer, prior radiation therapy and recurrent aspiration. EXAM: CHEST  1 VIEW COMPARISON:  11/20/2018 FINDINGS: Endotracheal tube tip 7.4 cm above the carina, just below the thoracic inlet. Atherosclerotic calcification of the aortic arch. Heart size within normal limits. Continued indistinct airspace opacity obscuring the left hemidiaphragm with hazy density in the left mid  lung and left lung base. There is some volume loss on the left with shift of cardiac structures to the left. Mild improvement in the perihilar airspace opacity shown on the prior exam. Thoracic spondylosis.  Emphysema noted. IMPRESSION: 1. Mild improvement in the perihilar airspace opacity on the left, a continued left lower lobe airspace opacity with continued obscuration of left hemidiaphragm. 2. Aortic Atherosclerosis (ICD10-I70.0) and Emphysema (ICD10-J43.9). 3. The endotracheal tube is 7.4 cm above the carina. Electronically Signed   By: Van Clines M.D.   On: 11/21/2018 12:31   Dg Chest 1 View  Result Date: 11/20/2018 CLINICAL DATA:  Patient unable to answer questions.  History of ETT. EXAM: CHEST  1 VIEW COMPARISON:  None. FINDINGS: The ETT terminates 13 mm above the carina. Recommend withdrawing 2 cm. No  pneumothorax. The right lung is clear. Opacity in the left mid lower lung is increased in the interval. No change in the cardiomediastinal silhouette. IMPRESSION: 1. The ETT terminates 13 mm above the carina. Recommend withdrawing 2 cm. 2. Worsening infiltrate in the left mid and lower lung. These results will be called to the ordering clinician or representative by the Radiologist Assistant, and communication documented in the PACS or zVision Dashboard. Electronically Signed   By: Dorise Bullion III M.D   On: 11/20/2018 16:36   Dg Chest 2 View  Result Date: 12/06/2018 CLINICAL DATA:  Hypoxia EXAM: CHEST - 2 VIEW COMPARISON:  12/03/2018 FINDINGS: No significant interval change in heterogeneous bilateral airspace opacity, most conspicuous in left lower lobe. No new airspace opacity. Tracheostomy. The heart and mediastinum are normal. Aortic atherosclerosis. Disc degenerative disease of the thoracic spine. IMPRESSION: No significant interval change in heterogeneous bilateral airspace opacity, most conspicuous in left lower lobe, consistent with multifocal infection. No new airspace opacity. Tracheostomy. Electronically Signed   By: Eddie Candle M.D.   On: 12/06/2018 10:49   Dg Chest 2 View  Result Date: 12/03/2018 CLINICAL DATA:  Choking, tracheostomy, recurrent aspiration EXAM: CHEST - 2 VIEW COMPARISON:  11/21/2018 FINDINGS: The heart size and mediastinal contours are within normal limits. No significant change heterogeneous, somewhat nodular airspace opacity of the left mid lung and right lung base, generally in keeping with nodular airspace disease seen on CT dated 09/18/2018. Tracheostomy. Disc degenerative disease of the thoracic spine. IMPRESSION: No significant change heterogeneous, somewhat nodular airspace opacity of the left mid lung and right lung base, generally in keeping with nodular airspace disease seen on CT dated 09/18/2018. Tracheostomy. Electronically Signed   By: Eddie Candle M.D.   On:  12/03/2018 11:17   Dg Chest Port 1 View  Result Date: 11/20/2018 CLINICAL DATA:  Ventilator dependence. EXAM: PORTABLE CHEST 1 VIEW COMPARISON:  One day prior FINDINGS: Endotracheal tube terminates 2.0 cm above carina. Midline trachea. Normal heart size. No pleural effusion or pneumothorax. Diffuse interstitial thickening. Left mid and lower lung airspace disease is similar. IMPRESSION: No significant change in left mid and lower lung airspace disease, suspicious for infection or aspiration. Diffuse interstitial prominence is unchanged, likely the sequelae of smoking/chronic bronchitis. Endotracheal tube borderline low in position. This could be retracted 1-2 cm or readdressed on follow-up radiographs. Electronically Signed   By: Abigail Miyamoto M.D.   On: 11/20/2018 06:53   Dg Chest Port 1 View  Result Date: 11/19/2018 CLINICAL DATA:  Ventilator dependence. EXAM: PORTABLE CHEST 1 VIEW COMPARISON:  Radiographs of November 18, 2018. FINDINGS: Stable cardiomediastinal silhouette. Endotracheal tube is in good position. No pneumothorax is noted. Right  lung is clear. Stable left lower lobe opacity is noted concerning for pneumonia. Small left pleural effusion cannot be excluded. Bony thorax is unremarkable. IMPRESSION: Stable support apparatus. Stable left lower lobe opacity is noted concerning for pneumonia. Aortic Atherosclerosis (ICD10-I70.0). Electronically Signed   By: Marijo Conception M.D.   On: 11/19/2018 08:20   Portable Chest X-ray  Result Date: 11/18/2018 CLINICAL DATA:  Endotracheal tube. EXAM: PORTABLE CHEST 1 VIEW COMPARISON:  Radiograph of November 17, 2018. FINDINGS: Stable cardiomediastinal silhouette. Endotracheal tube is in grossly good position with distal tip 4 cm above the carina. No pneumothorax is noted. Right lung is clear. Stable left lower lobe opacity is noted concerning for pneumonia. No significant pleural effusion is noted. Bony thorax is unremarkable. IMPRESSION: Endotracheal tube in  grossly good position. Stable left lower lobe opacity is noted concerning for pneumonia. Aortic Atherosclerosis (ICD10-I70.0). Electronically Signed   By: Marijo Conception M.D.   On: 11/18/2018 14:53   Dg Chest Port 1 View  Result Date: 11/17/2018 CLINICAL DATA:  58 year old male transferred from [facility] with acute hypoxic respiratory failure, presumed pneumonia. EXAM: PORTABLE CHEST 1 VIEW COMPARISON:  11/16/2018 portable chest and earlier. FINDINGS: Portable AP semi upright view at 0836 hours. Endotracheal tube tip in good position between the level the clavicles and carina. Large lung volumes. Increasing Patchy and confluent opacity throughout the lower left lung. No superimposed pneumothorax, pulmonary edema or pleural effusion. Right lung parenchyma appears stable. Mediastinal contours remain normal. Negative visible bowel gas pattern. No acute osseous abnormality identified. IMPRESSION: 1. Increasing left lower lung opacity most suspicious for pneumonia. No pleural effusion. 2. Underlying hyperinflation and emphysema. 3. ETT in good position. Electronically Signed   By: Genevie Ann M.D.   On: 11/17/2018 10:31   Dg Chest Port 1 View  Result Date: 11/16/2018 CLINICAL DATA:  Intubated EXAM: PORTABLE CHEST 1 VIEW COMPARISON:  11/16/2018 FINDINGS: Endotracheal to is 5 cm above the carina. Patchy airspace disease in the left mid and lower lung, similar prior study. Right lung clear. Underlying COPD with hyperinflation. Heart is normal size. No effusions. Distal left clavicle fracture, likely chronic. IMPRESSION: Endotracheal tube 5 cm above the carina. Patchy left mid and lower lung airspace disease, similar to prior study. COPD. Distal left clavicle fracture, likely chronic. Electronically Signed   By: Rolm Baptise M.D.   On: 11/16/2018 23:59   Dg Chest Port 1 View  Result Date: 11/13/2018 CLINICAL DATA:  Shortness of breath, cough EXAM: PORTABLE CHEST 1 VIEW COMPARISON:  10/27/2018 FINDINGS: The  heart size and mediastinal contours are within normal limits. Pulmonary hyperinflation and emphysema. There is extensive heterogeneous opacity of the lower left lung, which is not significantly changed compared to prior examination. Opacity of the lower right lung is improved compared to prior examination. The visualized skeletal structures are unremarkable. IMPRESSION: Pulmonary hyperinflation and emphysema. There is extensive heterogeneous opacity of the lower left lung, which is not significantly changed compared to prior examination. Opacity of the lower right lung is improved compared to prior examination. Findings are consistent with persistent airspace disease. Electronically Signed   By: Eddie Candle M.D.   On: 11/13/2018 20:45     Subjective: Patient seen and examined.  No overnight events.  He is dressed up since morning, he is getting up and coming to the door asking for discharge.  Patient and his wife say that they are ready to go home.    Discharge Exam: Vitals:   12/13/18 1153  12/13/18 1500  BP:    Pulse: 98   Resp: 18   Temp:    SpO2: 95% 96%   Vitals:   12/13/18 0835 12/13/18 0844 12/13/18 1153 12/13/18 1500  BP: 116/60     Pulse: 92 (!) 101 98   Resp:  18 18   Temp: 98.2 F (36.8 C)     TempSrc: Oral     SpO2: 96% 93% 95% 96%  Weight:      Height:        General: Pt is alert, awake, not in acute distress, patient on room air.  Talking with trach with Passy-Muir valve. Cardiovascular: RRR, S1/S2 +, no rubs, no gallops Respiratory: CTA bilaterally, no wheezing, no rhonchi, some conducted upper airway sounds from tracheostomy. Abdominal: Soft, NT, ND, bowel sounds +, PEG tube that is clean and dry. Extremities: no edema, no cyanosis    The results of significant diagnostics from this hospitalization (including imaging, microbiology, ancillary and laboratory) are listed below for reference.     Microbiology: No results found for this or any previous visit  (from the past 240 hour(s)).   Labs: BNP (last 3 results) Recent Labs    03/10/18 1442  BNP [id number]*   Basic Metabolic Panel: No results for input(s): NA, K, CL, CO2, GLUCOSE, BUN, CREATININE, CALCIUM, MG, PHOS in the last 168 hours. Liver Function Tests: No results for input(s): AST, ALT, ALKPHOS, BILITOT, PROT, ALBUMIN in the last 168 hours. No results for input(s): LIPASE, AMYLASE in the last 168 hours. No results for input(s): AMMONIA in the last 168 hours. CBC: No results for input(s): WBC, NEUTROABS, HGB, HCT, MCV, PLT in the last 168 hours. Cardiac Enzymes: No results for input(s): CKTOTAL, CKMB, CKMBINDEX, TROPONINI in the last 168 hours. BNP: Invalid input(s): POCBNP CBG: No results for input(s): GLUCAP in the last 168 hours. D-Dimer No results for input(s): DDIMER in the last 72 hours. Hgb A1c No results for input(s): HGBA1C in the last 72 hours. Lipid Profile No results for input(s): CHOL, HDL, LDLCALC, TRIG, CHOLHDL, LDLDIRECT in the last 72 hours. Thyroid function studies No results for input(s): TSH, T4TOTAL, T3FREE, THYROIDAB in the last 72 hours.  Invalid input(s): FREET3 Anemia work up No results for input(s): VITAMINB12, FOLATE, FERRITIN, TIBC, IRON, RETICCTPCT in the last 72 hours. Urinalysis    Component Value Date/Time   COLORURINE YELLOW 11/17/2018 0100   APPEARANCEUR CLOUDY (A) 11/17/2018 0100   LABSPEC 1.011 11/17/2018 0100   PHURINE 8.0 11/17/2018 0100   GLUCOSEU NEGATIVE 11/17/2018 0100   HGBUR NEGATIVE 11/17/2018 0100   BILIRUBINUR NEGATIVE 11/17/2018 0100   KETONESUR NEGATIVE 11/17/2018 0100   PROTEINUR NEGATIVE 11/17/2018 0100   UROBILINOGEN 0.2 05/20/2007 0952   NITRITE NEGATIVE 11/17/2018 0100   LEUKOCYTESUR TRACE (A) 11/17/2018 0100   Sepsis Labs Invalid input(s): PROCALCITONIN,  WBC,  LACTICIDVEN Microbiology No results found for this or any previous visit (from the past 240 hour(s)).   Time coordinating discharge:  35  minutes  SIGNED:   Barb Merino, MD  [facility] 12/13/2018, 3:19 PM

## 2018-12-14 NOTE — Telephone Encounter (Signed)
Called and spoke to  [place] with [facility]. She states the nursing care is primarily for trach care and PEG tube care. Nira Conn states she would go to the home and do an assessment and then send the plan of care to the provider to sign off on. Nira Conn states at this time it is just a request as they have to submit this information to Medicaid to see if pt qualifies for services. Pt's PCP refused to sign off on request as he has not seen the pt after he had the trach and peg tube. Per Nira Conn the pt was denied Hospice but is unsure of Palliative care. Per pt's chart I do not see a referral in the notes about a palliative care referral.   Will forward to Dr. Valeta Harms to advise further. Thanks.

## 2018-12-14 NOTE — Telephone Encounter (Signed)
LMTCB for Tony Berry.  Order placed for palliative care.

## 2018-12-14 NOTE — Telephone Encounter (Signed)
Tony Berry, please place referral for home health needs regarding trach. Also, please place referral to [facility] palliative medicine.   Thanks  BLI

## 2018-12-15 ENCOUNTER — Telehealth: Payer: Self-pay | Admitting: Internal Medicine

## 2018-12-15 NOTE — Telephone Encounter (Signed)
Spoke with [employer]. She is aware that these orders have been taken care of. Nothing further was needed.

## 2018-12-15 NOTE — Telephone Encounter (Signed)
Spoke with patient's wife [name], regarding Palliative services and she was in agreement with this.  I have scheduled a Telephone Consult for 12/20/18 @ 11:30 AM.

## 2018-12-15 NOTE — Telephone Encounter (Signed)
Called patient to schedule Palliative Consult, no answer - left message with [facility] for call along with my contact information. °

## 2018-12-16 DIAGNOSIS — J189 Pneumonia, unspecified organism: Secondary | ICD-10-CM

## 2018-12-16 DIAGNOSIS — J440 Chronic obstructive pulmonary disease with acute lower respiratory infection: Secondary | ICD-10-CM | POA: Diagnosis not present

## 2018-12-17 DIAGNOSIS — J440 Chronic obstructive pulmonary disease with acute lower respiratory infection: Secondary | ICD-10-CM | POA: Diagnosis not present

## 2018-12-17 DIAGNOSIS — J189 Pneumonia, unspecified organism: Secondary | ICD-10-CM | POA: Diagnosis not present

## 2018-12-18 DIAGNOSIS — J440 Chronic obstructive pulmonary disease with acute lower respiratory infection: Secondary | ICD-10-CM | POA: Diagnosis not present

## 2018-12-18 DIAGNOSIS — J189 Pneumonia, unspecified organism: Secondary | ICD-10-CM | POA: Diagnosis not present

## 2018-12-20 ENCOUNTER — Other Ambulatory Visit: Payer: Medicaid Other | Admitting: Internal Medicine

## 2018-12-20 ENCOUNTER — Telehealth: Payer: Self-pay

## 2018-12-20 ENCOUNTER — Telehealth: Payer: Self-pay | Admitting: Pulmonary Disease

## 2018-12-20 ENCOUNTER — Other Ambulatory Visit: Payer: Self-pay

## 2018-12-20 DIAGNOSIS — Z515 Encounter for palliative care: Secondary | ICD-10-CM

## 2018-12-20 DIAGNOSIS — J9621 Acute and chronic respiratory failure with hypoxia: Secondary | ICD-10-CM

## 2018-12-20 NOTE — Telephone Encounter (Signed)
Spoke with Enid Derry, [facility] NP, states that she had a telephone meeting with pt today, was hospitalized from 8/25-9/21 at [facility], and 9/23-9/26 at [facility].  Tracheostomy on 8/30.  Pt is home alone.  Pt is unable to suction his trach and has nobody else available to help him with his trach care.  Pt suggested to [place] today that he felt that he needed to "be somewhere where they can take care of his trach".  Shirley directly asked pt if he felt like he needed to be in a nursing home/assisted care facility to help with his trach and general healthcare needs at this time.  Enid Derry tried to have this retroactively ordered through [facility], but since pt was discharged they can no longer order this for pt.    Home health nursing is not an option at this time as the pt's home needs significant repairs before being safe for HHN's to enter home and deliver care.  Dr Valeta Harms saw pt once on 05/11/2018, has not followed up in our office since.  Enid Derry is wanting to know if Dr. Valeta Harms is willing to place orders to have pt placed in a nursing/assisted living facility.    Routing to [employer] (app of the day) to see if this can be addressed by her or if this needs to wait for Dr. Valeta Harms.  Thanks!

## 2018-12-20 NOTE — Telephone Encounter (Signed)
Gonzella Lex NP is returning phone call.  Enid Derry phone number is [phone].

## 2018-12-20 NOTE — Telephone Encounter (Signed)
LMTCB

## 2018-12-20 NOTE — Telephone Encounter (Signed)
PCCM:  Yes, ok to place orders. But I am not sure that we can get him in a SNF/ALF in the outpatient setting.  Palliative care or hospice care would be most appropriate as discussed in our last office visit.   Garner Nash, DO [facility] 12/20/2018 5:11 PM

## 2018-12-20 NOTE — Telephone Encounter (Signed)
Patient called to report he ran out of pain meds this morning (Percocet) that was ordered from the hospital. NP Cates did a telephone visit this morning. RN instructed patient to contact PCP and gave phone number. Relayed information to [employer] as well (NP for palliative care)

## 2018-12-20 NOTE — Progress Notes (Signed)
[place] Consult Note Telephone: [phone]  Fax: [phone]  PATIENT NAME: Tony Berry DOB: 12/26/1960 MRN: [id number]  PRIMARY CARE PROVIDER:   Cher Nakai, MD  REFERRING PROVIDER:  Dr. June Leap  RESPONSIBLE PARTY:   Self and wife      RECOMMENDATIONS and PLAN:   Palliative Care Encounter  Z51.5  1.  Advance Care Planning: Discussed palliative and hospice care services to patient.  Goals of care reviewed which are to improve strength, appetite and to continue all treatments necessary for same.  He desires to have more in home assistance. MOST form discussion with choices of full scope of treatment inclusive of ventilator use prn.    2.  Pulmonary congestion:  Related to chronic respiratory failure, aspirations and recent PNA.  Newly established tracheostomy.  Encouraged endotracheal suctioning however assistance with completing task is not available at home.  Home health nursing would be very beneficial but is unable to visit pt at this time.  Facility placement would also be a good alternative in light of pt's skilled nursing needs and unavailability of assistance at home by family members.  Pt and wife are desiring this alternative arrangement. Notified pulmonary provider's nurse [place] of pt status and recommendations/desires for admission to SNF.  He is at high risk of re-admission to hospital. Seek emergency treatment prn.  3.  Dysphagia:  Reviewed aspiration precautions.  Continue tube feedings via PEG as previously directed.  4. Debilitation:  Chronic and multifactorial.  Poor long-term expectations without increased assistance with personal health needs.  Reconsider SNF placement for rehab and reconditioning while home repairs are occurring.  Home health care will not be able to visit patient until repairs are completed.   Due to the COVID-19 crisis, this visit was performed telephonically and was initiated and  consented by the patient and or family.  I spent 60 minutes providing this consultation,  from 1130 to 1230. More than 50% of the time in this consultation was spent coordinating communication with patient and wife. Communication with Pulmonary provider office nurse.   HISTORY OF PRESENT ILLNESS:  Tony Berry is a 58 y.o. year old male with multiple medical problems including COPD, protein calorie malnutrition head/neck cancer.  He was hospitalized from 8/25-9/21/20 due to acute respiratory failure, aspiration PNA and creation of a tracheostomy and PEG tube. He was hospitalized again from 9/25-9/28/20 due to recurrent respiratory distress.  Recommendations for SNF placement were declined by pt. Family.  Brother is no longer able to provide care for patient and wife works for numerous hours and is unavailable to assist with his care.  Palliative Care was asked to help address goals of care.   CODE STATUS: FULL CODE  PPS: 40% HOSPICE ELIGIBILITY/DIAGNOSIS: TBD  PAST MEDICAL HISTORY:  Past Medical History:  Diagnosis Date  . Esophageal cancer ([place])   . Head and neck cancer ([place])   . Hypertension      PERTINENT MEDICATIONS:  Outpatient Encounter Medications as of 12/20/2018  Medication Sig  . albuterol (PROVENTIL) (2.5 MG/3ML) 0.083% nebulizer solution Take 2.5 mg by nebulization every 6 (six) hours as needed for wheezing or shortness of breath.   Marland Kitchen albuterol (VENTOLIN HFA) 108 (90 Base) MCG/ACT inhaler Inhale 2 puffs into the lungs every 6 (six) hours as needed for wheezing or shortness of breath.  Marland Kitchen aspirin 81 MG chewable tablet Chew 1 tablet (81 mg total) by mouth daily. Please do not take for five days  before your gastrostomy tube placement procedure (Patient taking differently: Place 81 mg into feeding tube daily. )  . formoterol (PERFOROMIST) 20 MCG/2ML nebulizer solution Take 20 mcg by nebulization 2 (two) times daily.  . furosemide (LASIX) 20 MG tablet Take 1 tablet (20 mg total) by  mouth 2 (two) times daily. (Patient taking differently: Place 20 mg into feeding tube 2 (two) times daily as needed for fluid or edema. )  . gabapentin (NEURONTIN) 250 MG/5ML solution Place 6 mLs (300 mg total) into feeding tube every 8 (eight) hours.  . Nutritional Supplements (FEEDING SUPPLEMENT, JEVITY 1.2 CAL,) LIQD Place 474-711 mLs into feeding tube 2 (two) times daily. 2-3 cans twice daily  . ondansetron (ZOFRAN) 4 MG tablet Place 4 mg into feeding tube every 8 (eight) hours as needed for nausea or vomiting.   . OXYGEN Inhale 5 L into the lungs continuous.   . polyethylene glycol (MIRALAX / GLYCOLAX) packet Take 17 g by mouth daily.  . QUEtiapine (SEROQUEL) 50 MG tablet Place 1 tablet (50 mg total) into feeding tube at bedtime.  . senna-docusate (SENOKOT-S) 8.6-50 MG tablet Place 1 tablet into feeding tube daily.  . sodium chloride (OCEAN) 0.65 % SOLN nasal spray Place 1 spray into both nostrils as needed for congestion.  . sodium chloride 1 g tablet Take 1 tablet (1 g total) by mouth 2 (two) times daily with a meal. (Patient taking differently: Place 1 g into feeding tube 2 (two) times daily with a meal. )  . umeclidinium-vilanterol (ANORO ELLIPTA) 62.5-25 MCG/INH AEPB Inhale 1 puff into the lungs daily.  . Water For Irrigation, Sterile (FREE WATER) SOLN Place 120 mLs into feeding tube 4 (four) times daily.   No facility-administered encounter medications on file as of 12/20/2018.     PHYSICAL EXAM:   General: Sounds weak Cardiovascular: unable to assess Pulmonary: Congested cough. Interruption of speech Neurological: Alert and communicative  Gonzella Lex, NP-C

## 2018-12-20 NOTE — Telephone Encounter (Signed)
I assume so but I think we should send this to Dr. Valeta Harms for approval. Routing to him

## 2018-12-21 NOTE — Telephone Encounter (Signed)
Order placed for urgent referral to social work for facility placement.   Call returned to NP Gonzella Lex, I made her aware that we have placed a urgent referral to social work to have facility placement. I made her aware that if this process does not move along quickly we may have to contact DSS to assist. States she was thankful for the call and to please keep her updated.   Call made to patient wife, she reports he is not able to stay in the home because a tree fell on their mobile home. She reports that they got the tree removed but because of the damage from the water and rain the foundation has been weakened. She reports the floors have holes, their is bugs, the foundation is weak. I inquired as to whether they would be able to come to "trach clinic" if we were able to make it possible. She reports they do not have transportation. They are depending on other people. She reports she has to work in order to pay the bills and she is unable to take any more days off. She reports she does not know what they are going to do. I made her aware we will keep her updated. Voiced understanding.   Will route message to [facility] as FYI. BI home is unsafe and neither HH nor palliative are able to go into home. Patient had a tree fall on mobile home and patient is on list for repairs however it will be a while before home can be fixed.

## 2018-12-21 NOTE — Telephone Encounter (Signed)
PCCM:  Recommendations: Adult protective services should be called from our office please no matter the willingness of the wife.   Additionally, the patient should probable be taken to the closest ED for evaluation and consideration for admission to a SNF or [place], DO [facility] 12/21/2018 2:51 PM

## 2018-12-21 NOTE — Telephone Encounter (Signed)
LMTCB with patient.   LMTCB with Adult Protective Services Supervisor: Harless Nakayama  [phone].   Instructed on voicemail to call [phone].   LMTCB.    Will route message to myself to try back tomorrow.

## 2018-12-21 NOTE — Telephone Encounter (Signed)
Called Adult Protective Services of [place] at [phone] and had to leave a VM.   lmtcb for pt.

## 2018-12-22 NOTE — Telephone Encounter (Signed)
PCCM:  Thank you for helping coordinate. This is definitely the most appropriate avenue.  If we do not get through to APS we can call the local sheriff office for a health check or wellness check.   Garner Nash, DO [facility] 12/22/2018 8:24 AM

## 2018-12-22 NOTE — Telephone Encounter (Signed)
Call returned to [place], spoke with Tony Berry, she states normally they will have the patient to call and have his PCP fill out the [facility]. She states once the PCP fills out the [facility] he will need a TB test and a covid test. She reports that typically with situations like this they try not get DSS involved due to the ramifications that will come along with it. She states being that he has medicaid he will get placed quickly. I inquired as to how long this process takes due to the patient needing trach care in the meantime, she reports it can take weeks. She reports in the past they recommend sending the patient to ER during the meantime to address his trach.

## 2018-12-22 NOTE — Telephone Encounter (Signed)
Will route message to [facility] for his recommendations.   Please advise, should I call 911, have patient go to ER?

## 2018-12-23 NOTE — Telephone Encounter (Signed)
Call made to patient,  and he will need to either have his pcp arrange facility placement which his wife will have to help coordinate or he will need to present to the ER for trach care and be sure he makes them aware he will need facility placement. He states his PCP would not help get him home health assistance so he does not think he will help him with this. Patient states he feels more comfortable going back to the ER. Patient able to speak in full sentences, denies any distress. He states he does not need anything at the moment but he would call if anything changes. Nothing further needed at this time.

## 2018-12-27 ENCOUNTER — Encounter: Payer: Self-pay | Admitting: Internal Medicine

## 2018-12-27 ENCOUNTER — Ambulatory Visit: Payer: Medicaid Other | Admitting: Cardiology

## 2019-01-06 ENCOUNTER — Inpatient Hospital Stay (HOSPITAL_COMMUNITY): Admit: 2019-01-06 | Payer: Medicaid Other

## 2019-01-12 ENCOUNTER — Other Ambulatory Visit: Payer: Medicaid Other | Admitting: Internal Medicine

## 2019-01-12 ENCOUNTER — Other Ambulatory Visit: Payer: Self-pay

## 2019-01-12 ENCOUNTER — Telehealth: Payer: Self-pay | Admitting: Pulmonary Disease

## 2019-01-12 NOTE — Telephone Encounter (Signed)
Tony Berry from Palliative care is returning call.  [phone].

## 2019-01-12 NOTE — Telephone Encounter (Signed)
Left message for Shirley to call back.  

## 2019-01-12 NOTE — Telephone Encounter (Signed)
I called the number listed x 2 and it is the wrong phone number   Looked up the number for Gonzella Lex, NP online and [place] [phone])

## 2019-01-13 NOTE — Telephone Encounter (Signed)
Call made to [facility] on cell, patient will need to be evaluated at trach clinic for potential removal. RT scheduling closes at 4 will hold message and try in the AM.

## 2019-01-13 NOTE — Telephone Encounter (Signed)
Spoke with Gonzella Lex, NP with Palliative Care  She reports seen the pt on 01/12/19 and he told her that he was having some pain from the trach and wants it out  Pt reported to her that a wk ago his trach fell out and he had to go to ED in [place] and they put it back in  During the hour or so that he was without the trach in he had no breathing difficulties and so he feels should be okay to remove it  She did note some fungal infection around the trach site and so she prescribed him some nystatin cream, topical lidocaine and applied clean dressing   She is asking what are Dr Juline Patch thoughts on the trach being removed  Please advise thanks!

## 2019-01-13 NOTE — Telephone Encounter (Signed)
LMTCB x2 for [place].

## 2019-01-13 NOTE — Telephone Encounter (Signed)
Tony Berry from [facility] is returning the call. CB is [phone].

## 2019-01-14 NOTE — Telephone Encounter (Signed)
LMTCB with patient.   I need to know what dates are good for him to set his appt up with the trach clinic in order to be evaluated for the removal of his trach.

## 2019-01-17 NOTE — Telephone Encounter (Signed)
ATC pt, call went straight to voicemail. LMTCB x2 for pt.  

## 2019-01-19 NOTE — Telephone Encounter (Signed)
ATC pt, call went straight to voicemail. LMTCB x3 for pt. We have attempted to contact pt several times with no success or call back from pt. Per triage protocol, message will be closed.

## 2019-01-26 ENCOUNTER — Ambulatory Visit: Payer: Medicaid Other | Admitting: Cardiology

## 2019-02-13 DIAGNOSIS — R0789 Other chest pain: Secondary | ICD-10-CM | POA: Diagnosis not present

## 2019-02-13 DIAGNOSIS — I509 Heart failure, unspecified: Secondary | ICD-10-CM | POA: Diagnosis not present

## 2019-02-13 DIAGNOSIS — D72829 Elevated white blood cell count, unspecified: Secondary | ICD-10-CM

## 2019-02-22 ENCOUNTER — Ambulatory Visit: Payer: Medicaid Other | Admitting: Cardiology

## 2019-02-28 ENCOUNTER — Ambulatory Visit (INDEPENDENT_AMBULATORY_CARE_PROVIDER_SITE_OTHER): Payer: Medicaid Other | Admitting: Cardiology

## 2019-02-28 ENCOUNTER — Other Ambulatory Visit: Payer: Self-pay

## 2019-02-28 ENCOUNTER — Encounter: Payer: Self-pay | Admitting: Cardiology

## 2019-02-28 VITALS — BP 124/60 | HR 86 | Ht 66.0 in | Wt 125.4 lb

## 2019-02-28 DIAGNOSIS — R0789 Other chest pain: Secondary | ICD-10-CM

## 2019-02-28 NOTE — Progress Notes (Signed)
Cardiology Office Note:    Date:  02/28/2019   ID:  Tony Berry, DOB 11-16-1960, MRN [id number]  PCP:  Cher Nakai, MD  Cardiologist:  Jenean Lindau, MD   Referring MD: Cher Nakai, MD    ASSESSMENT:    1. Chest discomfort    PLAN:    In order of problems listed above:  1. Chest discomfort: As mentioned in my previous visit this is a gentleman who is very frail and has multiple comorbidities.  Overall prognosis appears to be poor.  He is asymptomatic at this time and continue medical management would be ideal in this gentleman and he understands.  His family members agree.Patient will be seen in follow-up appointment in 6 months or earlier if the patient has any concerns    Medication Adjustments/Labs and Tests Ordered: Current medicines are reviewed at length with the patient today.  Concerns regarding medicines are outlined above.  No orders of the defined types were placed in this encounter.  No orders of the defined types were placed in this encounter.    Chief Complaint  Patient presents with  . Follow-up     History of Present Illness:    Tony Berry is a 58 y.o. male.  Patient has past medical history of esophageal and head and neck cancer.  He was evaluated for chest pain.  Is a very frail gentleman.  He has tracheostomy.  He denies any chest pain at this time and takes care of of activities of daily living.  His family member accompanies him.  At the time of my evaluation, the patient is alert awake oriented and in no distress.  Past Medical History:  Diagnosis Date  . Esophageal cancer ([place])   . Head and neck cancer ([place])   . Hypertension     Past Surgical History:  Procedure Laterality Date  . BACK SURGERY  2008, 2011  . FOOT SURGERY Right   . IR GASTROSTOMY TUBE MOD SED  04/08/2018  . LEG SURGERY Right     Current Medications: Current Meds  Medication Sig  . albuterol (PROVENTIL) (2.5 MG/3ML) 0.083% nebulizer solution Take 2.5 mg by  nebulization every 6 (six) hours as needed for wheezing or shortness of breath.   Marland Kitchen albuterol (VENTOLIN HFA) 108 (90 Base) MCG/ACT inhaler Inhale 2 puffs into the lungs every 6 (six) hours as needed for wheezing or shortness of breath.  Marland Kitchen aspirin 81 MG chewable tablet Chew 1 tablet (81 mg total) by mouth daily. Please do not take for five days before your gastrostomy tube placement procedure (Patient taking differently: Place 81 mg into feeding tube daily. )  . formoterol (PERFOROMIST) 20 MCG/2ML nebulizer solution Take 20 mcg by nebulization 2 (two) times daily.  . Lidocaine 3 % CREA as needed.  . nystatin cream (MYCOSTATIN) 2 (two) times daily.  . ondansetron (ZOFRAN) 4 MG tablet Place 4 mg into feeding tube every 8 (eight) hours as needed for nausea or vomiting.   . Oxycodone HCl 10 MG TABS Take 10 mg by mouth every 6 (six) hours as needed.  . OXYGEN Inhale 5 L into the lungs continuous.   Marland Kitchen senna-docusate (SENOKOT-S) 8.6-50 MG tablet Place 1 tablet into feeding tube daily.  . sodium chloride (OCEAN) 0.65 % SOLN nasal spray Place 1 spray into both nostrils as needed for congestion.  . Water For Irrigation, Sterile (FREE WATER) SOLN Place 120 mLs into feeding tube 4 (four) times daily.     Allergies:  Patient has no known allergies.   Social History   Socioeconomic History  . Marital status: Married    Spouse name: Not on file  . Number of children: Not on file  . Years of education: Not on file  . Highest education level: Not on file  Occupational History  . Occupation: Curator    Comment: Retired   Scientific laboratory technician  . Financial resource strain: Not on file  . Food insecurity    Worry: Not on file    Inability: Not on file  . Transportation needs    Medical: Not on file    Non-medical: Not on file  Tobacco Use  . Smoking status: Former Smoker    Packs/day: 1.00    Years: 30.00    Pack years: 30.00    Types: Cigarettes    Start date: 03/11/1988    Quit date: 02/25/2018     Years since quitting: 1.0  . Smokeless tobacco: Former Network engineer and Sexual Activity  . Alcohol use: Yes    Comment: 30 years ago   . Drug use: No  . Sexual activity: Not on file  Lifestyle  . Physical activity    Days per week: Not on file    Minutes per session: Not on file  . Stress: Not on file  Relationships  . Social Herbalist on phone: Not on file    Gets together: Not on file    Attends religious service: Not on file    Active member of club or organization: Not on file    Attends meetings of clubs or organizations: Not on file    Relationship status: Not on file  Other Topics Concern  . Not on file  Social History Narrative  . Not on file     Family History: The patient's family history includes CAD in his father; COPD in his father, mother, and sister; Lung cancer in his mother; Pneumonia in his father. There is no history of Colon cancer.  ROS:   Please see the history of present illness.    All other systems reviewed and are negative.  EKGs/Labs/Other Studies Reviewed:    The following studies were reviewed today: I reviewed previous str echo report with the patient at length.   Recent Labs: 03/10/2018: B Natriuretic Peptide 693.7 09/29/2018: TSH 0.612 11/17/2018: ALT 10 11/19/2018: Magnesium 1.7 11/27/2018: BUN 15; Creatinine, Ser 0.31; Hemoglobin 9.2; Platelets 396; Potassium 3.9; Sodium 136  Recent Lipid Panel No results found for: CHOL, TRIG, HDL, CHOLHDL, VLDL, LDLCALC, LDLDIRECT  Physical Exam:    VS:  BP 124/60   Pulse 86   Ht 5\' 6"  (1.676 m)   Wt 125 lb 6.4 oz (56.9 kg)   SpO2 90%   BMI 20.24 kg/m     Wt Readings from Last 3 Encounters:  02/28/19 125 lb 6.4 oz (56.9 kg)  12/12/18 103 lb 9.9 oz (47 kg)  10/31/18 117 lb 1 oz (53.1 kg)     GEN: Patient is in no acute distress HEENT: Normal NECK: No JVD; No carotid bruits LYMPHATICS: No lymphadenopathy CARDIAC: Hear sounds regular, 2/6 systolic murmur at the apex.  RESPIRATORY:  Clear to auscultation without rales, wheezing or rhonchi  ABDOMEN: Soft, non-tender, non-distended MUSCULOSKELETAL:  No edema; No deformity  SKIN: Warm and dry NEUROLOGIC:  Alert and oriented x 3 PSYCHIATRIC:  Normal affect   Signed, Jenean Lindau, MD  02/28/2019 3:58 PM    [facility]

## 2019-02-28 NOTE — Patient Instructions (Signed)

## 2019-03-21 ENCOUNTER — Telehealth: Payer: Self-pay | Admitting: Pulmonary Disease

## 2019-03-21 NOTE — Telephone Encounter (Signed)
Called and spoke with pt's wife Georgie. [name] is wanting to know if pt can have an Rx for magic mouthwash called in for pt. Dr. Valeta Harms, please advise on this.

## 2019-03-21 NOTE — Telephone Encounter (Signed)
PCCM: Yes ok for magic mouthwash rx Garner Nash, DO [facility] 03/21/2019 6:51 PM

## 2019-03-22 MED ORDER — MAGIC MOUTHWASH: 5.0000 mL | Freq: Two times a day (BID) | ORAL | 3 refills | Status: DC

## 2019-03-22 MED ORDER — MAGIC MOUTHWASH: 5.0000 mL | Freq: Two times a day (BID) | ORAL | 3 refills | Status: AC

## 2019-03-22 NOTE — Telephone Encounter (Signed)
BI which sig and how many days to prescribe to pt?

## 2019-03-22 NOTE — Telephone Encounter (Signed)
PCCM:  6ml per mouth, swish do not swallow, twice daily 90 day supply   Garner Nash, DO [facility] 03/22/2019 1:06 PM

## 2019-03-22 NOTE — Telephone Encounter (Signed)
Order placed. Called and spoke with patient wife and advised the prescription was sent to [facility]. She stated that was fine, he will go to the pharmacy to pick it up. Nothing further needed.

## 2019-03-22 NOTE — Telephone Encounter (Signed)
Received a message from [place], [facility] that pt was currently at the pharmacy waiting on Rx but for some reason the Rx had status of print instead of normal to have it sent electronically sent to pharmacy. I attempted to send Rx to pharmacy but the Rx also printed for me.  Called pt's pharmacy and spoke with Minette Brine giving her a verbal of pt's Rx. Nothing further needed.

## 2019-03-28 ENCOUNTER — Other Ambulatory Visit: Payer: Medicaid Other

## 2019-03-28 ENCOUNTER — Other Ambulatory Visit (HOSPITAL_COMMUNITY)
Admission: RE | Admit: 2019-03-28 | Discharge: 2019-03-28 | Disposition: A | Discharge status: Home / Self Care | Payer: Medicaid Other | Source: Ambulatory Visit | Attending: Pulmonary Disease | Admitting: Pulmonary Disease

## 2019-03-28 DIAGNOSIS — Z20822 Contact with and (suspected) exposure to covid-19: Secondary | ICD-10-CM | POA: Insufficient documentation

## 2019-03-28 DIAGNOSIS — Z01812 Encounter for preprocedural laboratory examination: Secondary | ICD-10-CM | POA: Insufficient documentation

## 2019-03-28 LAB — SARS CORONAVIRUS 2 (TAT 6-24 HRS): SARS Coronavirus 2: NEGATIVE

## 2019-03-30 ENCOUNTER — Inpatient Hospital Stay (HOSPITAL_COMMUNITY): Admission: RE | Admit: 2019-03-30 | Payer: Medicaid Other | Source: Ambulatory Visit

## 2019-04-18 ENCOUNTER — Other Ambulatory Visit (HOSPITAL_COMMUNITY)
Admission: RE | Admit: 2019-04-18 | Discharge: 2019-04-18 | Disposition: A | Discharge status: Home / Self Care | Payer: Medicaid Other | Source: Ambulatory Visit | Attending: Acute Care | Admitting: Acute Care

## 2019-04-18 DIAGNOSIS — Z01812 Encounter for preprocedural laboratory examination: Secondary | ICD-10-CM | POA: Insufficient documentation

## 2019-04-18 DIAGNOSIS — Z20822 Contact with and (suspected) exposure to covid-19: Secondary | ICD-10-CM | POA: Insufficient documentation

## 2019-04-18 LAB — SARS CORONAVIRUS 2 (TAT 6-24 HRS): SARS Coronavirus 2: NEGATIVE

## 2019-04-21 ENCOUNTER — Ambulatory Visit (HOSPITAL_COMMUNITY)
Admission: RE | Admit: 2019-04-21 | Discharge: 2019-04-21 | Disposition: A | Discharge status: Home / Self Care | Payer: Medicaid Other | Source: Ambulatory Visit | Attending: Acute Care | Admitting: Acute Care

## 2019-04-21 ENCOUNTER — Other Ambulatory Visit: Payer: Self-pay | Admitting: Acute Care

## 2019-04-21 ENCOUNTER — Other Ambulatory Visit: Payer: Self-pay

## 2019-04-21 DIAGNOSIS — Z8589 Personal history of malignant neoplasm of other organs and systems: Secondary | ICD-10-CM | POA: Diagnosis not present

## 2019-04-21 DIAGNOSIS — B3789 Other sites of candidiasis: Secondary | ICD-10-CM | POA: Insufficient documentation

## 2019-04-21 DIAGNOSIS — B370 Candidal stomatitis: Secondary | ICD-10-CM

## 2019-04-21 DIAGNOSIS — Z930 Tracheostomy status: Secondary | ICD-10-CM | POA: Diagnosis not present

## 2019-04-21 DIAGNOSIS — Z430 Encounter for attention to tracheostomy: Secondary | ICD-10-CM | POA: Diagnosis not present

## 2019-04-21 DIAGNOSIS — Z923 Personal history of irradiation: Secondary | ICD-10-CM | POA: Insufficient documentation

## 2019-04-21 MED ORDER — FLUCONAZOLE 100 MG PO TABS: 100.0000 mg | ORAL_TABLET | Freq: Every day | ORAL | 0 refills | Status: AC

## 2019-04-21 NOTE — Progress Notes (Signed)
[facility]   Reason for visit:  tracheostomy change HPI:  This is a frail 59 year old male patient with extensive head neck cancer, status post radiation therapy with resultant severe dysphagia and recurrent aspiration.  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  He presents today for tracheostomy change, he had had previous cancellations due to Covid infection and closed tracheostomy clinic From a tracheostomy standpoint he has a size 4 cuffless tracheostomy.  He has chronic pain as a complication of his underlying malignancy and chronic back pain.  He also reports some subacute tracheostomy site tenderness which is been new over the last few days, and he is particularly anxious about tracheostomy change.  Presents today for planned tracheostomy change ROS  Review of Systems - History obtained from spouse General ROS: negative for - chills, fatigue, fever or night sweats Psychological ROS: positive for - anxiety ENT ROS: positive for - Fairly significant neck tenderness particularly around the tracheostomy Endocrine ROS: negative Respiratory ROS: no cough, shortness of breath, or wheezing Cardiovascular ROS: no chest pain or dyspnea on exertion Gastrointestinal ROS: no abdominal pain, change in bowel habits, or black or bloody stools Musculoskeletal ROS: negative Neurological ROS: no TIA or stroke symptoms  Vital signs:  Heart rate 80, respirations 16, pulse oximetry 99% on room air Exam:  Physical Exam Constitutional:      General: He is not in acute distress.    Appearance: He is not toxic-appearing.  HENT:     Head: Normocephalic.     Nose: Nose normal. No congestion.     Mouth/Throat:     Mouth: Mucous membranes are moist.     Pharynx: Oropharynx is clear.  Neck:     Comments: He has very limited neck mobility due to pain and guarding.  He reports marked  discomfort at the tracheostomy site and hyper sensitivity.  He has white exudative discharge at the bottom of the tracheostomy stoma located approximately 6:00.  This appears to be a candidal infection Pulmonary:     Effort: Pulmonary effort is normal.     Breath sounds: Normal breath sounds.  Abdominal:     General: Abdomen is flat.     Palpations: Abdomen is soft.  Musculoskeletal:        General: Normal range of motion.  Skin:    General: Skin is warm.     Capillary Refill: Capillary refill takes less than 2 seconds.  Neurological:     General: No focal deficit present.     Mental Status: He is alert.     Trach change/procedure: A size 4 tracheostomy was removed without difficulty, the tracheostomy site was examined, and notable for the white discoloration and exudative discharge at the bottom of the tracheostomy stoma.  Seem to be affixed to the skin and not regular mucous.  More consistent with a candidal type infection.  I localized the site extensively with topical lidocaine, and was able to change the tracheostomy with an identical size 4 cuffless without difficulty      Impression/dx  Candida infection involving the tracheostomy stoma Trach dependence  History of head neck cancer Discussion  Is a frail and anxious gentleman with history of head neck cancer, dysphagia, weight loss, chronic pain, and now tracheostomy dependence is a consequence of his underlying prior malignancy.  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  He does appear to have a candidal infection involving his tracheostomy stoma site Plan  We will place him on Diflucan 100 mg a day x14 days, I will call his spouse [name]  [phone] On February 1 to  see how he is doing Change trach q 12 week    Visit time: 32 minutes.   Erick Colace ACNP-BC [place]

## 2019-04-21 NOTE — Progress Notes (Addendum)
Tracheostomy Procedure Note  Tony Berry [id number] 10/29/1960  Pre Procedure Tracheostomy Information  Trach Brand: Shiley Size: 4.0 Style: Uncuffed Secured by: Sutures   Procedure: trach change and trach cleaning    Post Procedure Tracheostomy Information  Trach Brand: Shiley Size: 4.0 Style: Uncuffed Secured by: Velcro   Post Procedure Evaluation:  ETCO2 positive color change from yellow to purple : Yes.   Vital signs:VSS, pulse 80, respirations 16 and pulse oximetry 99 % on RA Patients current condition: stable Complications: No apparent complications Trach site exam: clean Wound care done: dry Patient did tolerate procedure well.   Education: none  Prescription needs: none    Additional needs:6 wee f/u trach clinic appt Pt give new PMV

## 2019-04-22 DIAGNOSIS — B370 Candidal stomatitis: Secondary | ICD-10-CM | POA: Insufficient documentation

## 2019-04-25 ENCOUNTER — Other Ambulatory Visit: Payer: Self-pay | Admitting: Acute Care

## 2019-04-25 MED ORDER — LIDOCAINE 5 % EX OINT: 1.0000 "application " | TOPICAL_OINTMENT | CUTANEOUS | 0 refills | Status: AC | PRN

## 2019-04-25 NOTE — Progress Notes (Signed)
Still having sig amount of trach stoma tenderness.   Will tray Lidocaine ointment.   Erick Colace ACNP-BC [place] Pager # [phone] OR # [phone] if no answer

## 2019-05-04 ENCOUNTER — Inpatient Hospital Stay (HOSPITAL_COMMUNITY): Payer: Medicaid Other

## 2019-05-04 ENCOUNTER — Inpatient Hospital Stay (HOSPITAL_COMMUNITY)
Admission: AD | Admit: 2019-05-04 | Discharge: 2019-05-23 | DRG: 871 | Disposition: E | Payer: Medicaid Other | Source: Other Acute Inpatient Hospital | Attending: Critical Care Medicine | Admitting: Critical Care Medicine

## 2019-05-04 DIAGNOSIS — Z930 Tracheostomy status: Secondary | ICD-10-CM | POA: Diagnosis not present

## 2019-05-04 DIAGNOSIS — Z87891 Personal history of nicotine dependence: Secondary | ICD-10-CM | POA: Diagnosis not present

## 2019-05-04 DIAGNOSIS — K7200 Acute and subacute hepatic failure without coma: Secondary | ICD-10-CM | POA: Diagnosis present

## 2019-05-04 DIAGNOSIS — R6521 Severe sepsis with septic shock: Secondary | ICD-10-CM | POA: Diagnosis present

## 2019-05-04 DIAGNOSIS — J9622 Acute and chronic respiratory failure with hypercapnia: Secondary | ICD-10-CM | POA: Diagnosis present

## 2019-05-04 DIAGNOSIS — Z515 Encounter for palliative care: Secondary | ICD-10-CM | POA: Diagnosis present

## 2019-05-04 DIAGNOSIS — J9690 Respiratory failure, unspecified, unspecified whether with hypoxia or hypercapnia: Secondary | ICD-10-CM | POA: Diagnosis not present

## 2019-05-04 DIAGNOSIS — R64 Cachexia: Secondary | ICD-10-CM | POA: Diagnosis not present

## 2019-05-04 DIAGNOSIS — I110 Hypertensive heart disease with heart failure: Secondary | ICD-10-CM | POA: Diagnosis not present

## 2019-05-04 DIAGNOSIS — Z7982 Long term (current) use of aspirin: Secondary | ICD-10-CM

## 2019-05-04 DIAGNOSIS — Z20822 Contact with and (suspected) exposure to covid-19: Secondary | ICD-10-CM | POA: Diagnosis present

## 2019-05-04 DIAGNOSIS — C159 Malignant neoplasm of esophagus, unspecified: Secondary | ICD-10-CM | POA: Diagnosis present

## 2019-05-04 DIAGNOSIS — N179 Acute kidney failure, unspecified: Secondary | ICD-10-CM

## 2019-05-04 DIAGNOSIS — Z9889 Other specified postprocedural states: Secondary | ICD-10-CM | POA: Diagnosis not present

## 2019-05-04 DIAGNOSIS — I50811 Acute right heart failure: Secondary | ICD-10-CM | POA: Diagnosis present

## 2019-05-04 DIAGNOSIS — J189 Pneumonia, unspecified organism: Secondary | ICD-10-CM | POA: Diagnosis present

## 2019-05-04 DIAGNOSIS — A419 Sepsis, unspecified organism: Secondary | ICD-10-CM | POA: Diagnosis present

## 2019-05-04 DIAGNOSIS — K9421 Gastrostomy hemorrhage: Secondary | ICD-10-CM | POA: Diagnosis present

## 2019-05-04 DIAGNOSIS — Z66 Do not resuscitate: Secondary | ICD-10-CM | POA: Diagnosis present

## 2019-05-04 DIAGNOSIS — J9601 Acute respiratory failure with hypoxia: Secondary | ICD-10-CM | POA: Diagnosis present

## 2019-05-04 DIAGNOSIS — Z801 Family history of malignant neoplasm of trachea, bronchus and lung: Secondary | ICD-10-CM

## 2019-05-04 DIAGNOSIS — G9341 Metabolic encephalopathy: Secondary | ICD-10-CM | POA: Diagnosis present

## 2019-05-04 DIAGNOSIS — J440 Chronic obstructive pulmonary disease with acute lower respiratory infection: Secondary | ICD-10-CM | POA: Diagnosis not present

## 2019-05-04 DIAGNOSIS — E875 Hyperkalemia: Secondary | ICD-10-CM | POA: Diagnosis not present

## 2019-05-04 DIAGNOSIS — Z923 Personal history of irradiation: Secondary | ICD-10-CM

## 2019-05-04 DIAGNOSIS — G8929 Other chronic pain: Secondary | ICD-10-CM | POA: Diagnosis not present

## 2019-05-04 DIAGNOSIS — Y848 Other medical procedures as the cause of abnormal reaction of the patient, or of later complication, without mention of misadventure at the time of the procedure: Secondary | ICD-10-CM | POA: Diagnosis present

## 2019-05-04 DIAGNOSIS — K922 Gastrointestinal hemorrhage, unspecified: Secondary | ICD-10-CM | POA: Diagnosis present

## 2019-05-04 DIAGNOSIS — Z681 Body mass index (BMI) 19 or less, adult: Secondary | ICD-10-CM | POA: Diagnosis not present

## 2019-05-04 DIAGNOSIS — R579 Shock, unspecified: Secondary | ICD-10-CM

## 2019-05-04 DIAGNOSIS — J9620 Acute and chronic respiratory failure, unspecified whether with hypoxia or hypercapnia: Secondary | ICD-10-CM | POA: Diagnosis present

## 2019-05-04 DIAGNOSIS — Z9981 Dependence on supplemental oxygen: Secondary | ICD-10-CM

## 2019-05-04 DIAGNOSIS — J9621 Acute and chronic respiratory failure with hypoxia: Secondary | ICD-10-CM

## 2019-05-04 DIAGNOSIS — Z79891 Long term (current) use of opiate analgesic: Secondary | ICD-10-CM

## 2019-05-04 DIAGNOSIS — R570 Cardiogenic shock: Secondary | ICD-10-CM | POA: Diagnosis present

## 2019-05-04 DIAGNOSIS — Z79899 Other long term (current) drug therapy: Secondary | ICD-10-CM

## 2019-05-04 LAB — RESPIRATORY PANEL BY PCR

## 2019-05-04 LAB — DIC (DISSEMINATED INTRAVASCULAR COAGULATION)PANEL
D-Dimer, Quant: 12.54 ug/mL-FEU — ABNORMAL HIGH (ref 0.00–0.50)
Fibrinogen: 459 mg/dL (ref 210–475)
INR: 3 — ABNORMAL HIGH (ref 0.8–1.2)
Platelets: 170 10*3/uL (ref 150–400)
Prothrombin Time: 31.2 seconds — ABNORMAL HIGH (ref 11.4–15.2)
Smear Review: NONE SEEN
aPTT: 37 seconds — ABNORMAL HIGH (ref 24–36)

## 2019-05-04 LAB — CBC WITH DIFFERENTIAL/PLATELET
Abs Immature Granulocytes: 1.92 10*3/uL — ABNORMAL HIGH (ref 0.00–0.07)
Basophils Absolute: 0 10*3/uL (ref 0.0–0.1)
Basophils Relative: 1 %
Eosinophils Absolute: 0 10*3/uL (ref 0.0–0.5)
Eosinophils Relative: 0 %
HCT: 37.1 % — ABNORMAL LOW (ref 39.0–52.0)
Hemoglobin: 11.6 g/dL — ABNORMAL LOW (ref 13.0–17.0)
Immature Granulocytes: 61 %
Lymphocytes Relative: 6 %
Lymphs Abs: 0.2 10*3/uL — ABNORMAL LOW (ref 0.7–4.0)
MCH: 28.2 pg (ref 26.0–34.0)
MCHC: 31.3 g/dL (ref 30.0–36.0)
MCV: 90 fL (ref 80.0–100.0)
Monocytes Absolute: 0.1 10*3/uL (ref 0.1–1.0)
Monocytes Relative: 3 %
Neutro Abs: 0.9 10*3/uL — ABNORMAL LOW (ref 1.7–7.7)
Neutrophils Relative %: 29 %
Platelets: 168 10*3/uL (ref 150–400)
RBC: 4.12 MIL/uL — ABNORMAL LOW (ref 4.22–5.81)
RDW: 13.3 % (ref 11.5–15.5)
WBC: 3.1 10*3/uL — ABNORMAL LOW (ref 4.0–10.5)
nRBC: 2.9 % — ABNORMAL HIGH (ref 0.0–0.2)

## 2019-05-04 LAB — POCT I-STAT 7, (LYTES, BLD GAS, ICA,H+H)
Acid-base deficit: 3 mmol/L — ABNORMAL HIGH (ref 0.0–2.0)
Bicarbonate: 24 mmol/L (ref 20.0–28.0)
Calcium, Ion: 0.89 mmol/L — CL (ref 1.15–1.40)
HCT: 35 % — ABNORMAL LOW (ref 39.0–52.0)
Hemoglobin: 11.9 g/dL — ABNORMAL LOW (ref 13.0–17.0)
O2 Saturation: 92 %
Patient temperature: 36.4
Potassium: 5.2 mmol/L — ABNORMAL HIGH (ref 3.5–5.1)
Sodium: 125 mmol/L — ABNORMAL LOW (ref 135–145)
TCO2: 26 mmol/L (ref 22–32)
pCO2 arterial: 50.9 mmHg — ABNORMAL HIGH (ref 32.0–48.0)
pH, Arterial: 7.277 — ABNORMAL LOW (ref 7.350–7.450)
pO2, Arterial: 71 mmHg — ABNORMAL LOW (ref 83.0–108.0)

## 2019-05-04 LAB — GLUCOSE, CAPILLARY: Glucose-Capillary: 139 mg/dL — ABNORMAL HIGH (ref 70–99)

## 2019-05-04 LAB — COMPREHENSIVE METABOLIC PANEL
ALT: 2758 U/L — ABNORMAL HIGH (ref 0–44)
AST: 5246 U/L — ABNORMAL HIGH (ref 15–41)
Albumin: 2.6 g/dL — ABNORMAL LOW (ref 3.5–5.0)
Alkaline Phosphatase: 110 U/L (ref 38–126)
Anion gap: 13 (ref 5–15)
BUN: 79 mg/dL — ABNORMAL HIGH (ref 6–20)
CO2: 25 mmol/L (ref 22–32)
Calcium: 6.5 mg/dL — ABNORMAL LOW (ref 8.9–10.3)
Chloride: 87 mmol/L — ABNORMAL LOW (ref 98–111)
Creatinine, Ser: 4.33 mg/dL — ABNORMAL HIGH (ref 0.61–1.24)
GFR calc Af Amer: 16 mL/min — ABNORMAL LOW (ref >60)
GFR calc non Af Amer: 14 mL/min — ABNORMAL LOW (ref >60)
Glucose, Bld: 131 mg/dL — ABNORMAL HIGH (ref 70–99)
Potassium: 5.7 mmol/L — ABNORMAL HIGH (ref 3.5–5.1)
Sodium: 125 mmol/L — ABNORMAL LOW (ref 135–145)
Total Bilirubin: 1.5 mg/dL — ABNORMAL HIGH (ref 0.3–1.2)
Total Protein: 5.6 g/dL — ABNORMAL LOW (ref 6.5–8.1)

## 2019-05-04 LAB — MAGNESIUM: Magnesium: 2 mg/dL (ref 1.7–2.4)

## 2019-05-04 LAB — PROCALCITONIN: Procalcitonin: 63.81 ng/mL

## 2019-05-04 LAB — PHOSPHORUS: Phosphorus: 7.1 mg/dL — ABNORMAL HIGH (ref 2.5–4.6)

## 2019-05-04 LAB — ECHOCARDIOGRAM COMPLETE
Height: 68 in
Weight: 2000.01 oz

## 2019-05-04 LAB — LACTIC ACID, PLASMA
Lactic Acid, Venous: 2.8 mmol/L (ref 0.5–1.9)
Lactic Acid, Venous: 2.4 mmol/L (ref 0.5–1.9)

## 2019-05-04 LAB — BRAIN NATRIURETIC PEPTIDE: B Natriuretic Peptide: 2983.7 pg/mL — ABNORMAL HIGH (ref 0.0–100.0)

## 2019-05-04 LAB — MRSA PCR SCREENING: MRSA by PCR: NEGATIVE

## 2019-05-04 LAB — TROPONIN I (HIGH SENSITIVITY)
Troponin I (High Sensitivity): 781 ng/L (ref <18)
Troponin I (High Sensitivity): 707 ng/L (ref <18)

## 2019-05-04 LAB — POTASSIUM: Potassium: 5.5 mmol/L — ABNORMAL HIGH (ref 3.5–5.1)

## 2019-05-04 LAB — AMMONIA: Ammonia: 25 umol/L (ref 9–35)

## 2019-05-04 LAB — CK TOTAL AND CKMB (NOT AT ARMC)
CK, MB: 82.6 ng/mL — ABNORMAL HIGH (ref 0.5–5.0)
Total CK: 6829 U/L — ABNORMAL HIGH (ref 49–397)

## 2019-05-04 LAB — PROTIME-INR
INR: 3 — ABNORMAL HIGH (ref 0.8–1.2)
Prothrombin Time: 31.3 seconds — ABNORMAL HIGH (ref 11.4–15.2)

## 2019-05-04 MED ORDER — FENTANYL BOLUS VIA INFUSION
100.0000 ug | INTRAVENOUS | Status: DC | PRN
  Administered 2019-05-04 (×2): 50 ug via INTRAVENOUS
  Filled 2019-05-04: qty 100

## 2019-05-04 MED ORDER — CALCIUM GLUCONATE-NACL 2-0.675 GM/100ML-% IV SOLN
2.0000 g | Freq: Once | INTRAVENOUS | Status: AC
  Administered 2019-05-04: 2000 mg via INTRAVENOUS
  Filled 2019-05-04: qty 100

## 2019-05-04 MED ORDER — SODIUM ZIRCONIUM CYCLOSILICATE 10 G PO PACK
10.0000 g | PACK | Freq: Once | ORAL | Status: AC
  Administered 2019-05-04: 10 g via ORAL
  Filled 2019-05-04: qty 1

## 2019-05-04 MED ORDER — POLYVINYL ALCOHOL 1.4 % OP SOLN
1.0000 [drp] | Freq: Four times a day (QID) | OPHTHALMIC | Status: DC | PRN
  Filled 2019-05-04: qty 15

## 2019-05-04 MED ORDER — NYSTATIN 100000 UNIT/GM EX CREA
TOPICAL_CREAM | Freq: Two times a day (BID) | CUTANEOUS | Status: DC
  Filled 2019-05-04: qty 15

## 2019-05-04 MED ORDER — PANTOPRAZOLE SODIUM 40 MG IV SOLR: 40.0000 mg | Freq: Every day | INTRAVENOUS | Status: DC

## 2019-05-04 MED ORDER — CHLORHEXIDINE GLUCONATE CLOTH 2 % EX PADS
6.0000 | MEDICATED_PAD | Freq: Every day | CUTANEOUS | Status: DC
  Administered 2019-05-04: 6 via TOPICAL

## 2019-05-04 MED ORDER — ONDANSETRON HCL 4 MG/2ML IJ SOLN: 4.0000 mg | Freq: Four times a day (QID) | INTRAMUSCULAR | Status: DC | PRN

## 2019-05-04 MED ORDER — SODIUM CHLORIDE 0.9 % IV SOLN: INTRAVENOUS | Status: DC | PRN

## 2019-05-04 MED ORDER — FENTANYL CITRATE (PF) 100 MCG/2ML IJ SOLN: 50.0000 ug | INTRAMUSCULAR | Status: DC | PRN

## 2019-05-04 MED ORDER — FENTANYL CITRATE (PF) 100 MCG/2ML IJ SOLN
50.0000 ug | INTRAMUSCULAR | Status: AC
  Administered 2019-05-04: 50 ug via INTRAVENOUS

## 2019-05-04 MED ORDER — SODIUM CHLORIDE 0.9 % IV SOLN: INTRAVENOUS | Status: DC

## 2019-05-04 MED ORDER — PANTOPRAZOLE SODIUM 40 MG IV SOLR
40.0000 mg | Freq: Two times a day (BID) | INTRAVENOUS | Status: DC
  Administered 2019-05-04: 40 mg via INTRAVENOUS
  Filled 2019-05-04: qty 40

## 2019-05-04 MED ORDER — SODIUM CHLORIDE 0.9 % IV BOLUS
500.0000 mL | Freq: Once | INTRAVENOUS | Status: AC
  Administered 2019-05-04: 500 mL via INTRAVENOUS

## 2019-05-04 MED ORDER — GLYCOPYRROLATE 0.2 MG/ML IJ SOLN: 0.2000 mg | INTRAMUSCULAR | Status: DC | PRN

## 2019-05-04 MED ORDER — ORAL CARE MOUTH RINSE
15.0000 mL | OROMUCOSAL | Status: DC
  Administered 2019-05-04 (×7): 15 mL via OROMUCOSAL

## 2019-05-04 MED ORDER — VANCOMYCIN VARIABLE DOSE PER UNSTABLE RENAL FUNCTION (PHARMACIST DOSING): Status: DC

## 2019-05-04 MED ORDER — VASOPRESSIN 20 UNIT/ML IV SOLN
0.0400 [IU]/min | INTRAVENOUS | Status: DC
  Administered 2019-05-04: 0.04 [IU]/min via INTRAVENOUS
  Filled 2019-05-04: qty 2

## 2019-05-04 MED ORDER — DIPHENHYDRAMINE HCL 50 MG/ML IJ SOLN: 25.0000 mg | INTRAMUSCULAR | Status: DC | PRN

## 2019-05-04 MED ORDER — FENTANYL CITRATE (PF) 100 MCG/2ML IJ SOLN
50.0000 ug | INTRAMUSCULAR | Status: DC | PRN
  Filled 2019-05-04: qty 2

## 2019-05-04 MED ORDER — PIPERACILLIN-TAZOBACTAM IN DEX 2-0.25 GM/50ML IV SOLN
2.2500 g | Freq: Three times a day (TID) | INTRAVENOUS | Status: DC
  Administered 2019-05-04 (×2): 2.25 g via INTRAVENOUS
  Filled 2019-05-04 (×3): qty 50

## 2019-05-04 MED ORDER — NOREPINEPHRINE 4 MG/250ML-% IV SOLN
0.0000 ug/min | INTRAVENOUS | Status: DC
  Administered 2019-05-04: 25 ug/min via INTRAVENOUS
  Administered 2019-05-04: 30 ug/min via INTRAVENOUS
  Administered 2019-05-04: 22 ug/min via INTRAVENOUS
  Filled 2019-05-04 (×4): qty 250

## 2019-05-04 MED ORDER — ACETAMINOPHEN 160 MG/5ML PO SOLN
650.0000 mg | ORAL | Status: DC | PRN
  Administered 2019-05-04: 650 mg via ORAL
  Filled 2019-05-04: qty 20.3

## 2019-05-04 MED ORDER — GLYCOPYRROLATE 1 MG PO TABS: 1.0000 mg | ORAL_TABLET | ORAL | Status: DC | PRN

## 2019-05-04 MED ORDER — NOREPINEPHRINE 16 MG/250ML-% IV SOLN
0.0000 ug/min | INTRAVENOUS | Status: DC
  Administered 2019-05-04: 40 ug/min via INTRAVENOUS
  Filled 2019-05-04: qty 250

## 2019-05-04 MED ORDER — LACTATED RINGERS IV BOLUS
1000.0000 mL | Freq: Once | INTRAVENOUS | Status: AC
  Administered 2019-05-04: 1000 mL via INTRAVENOUS

## 2019-05-04 MED ORDER — CHLORHEXIDINE GLUCONATE 0.12% ORAL RINSE (MEDLINE KIT)
15.0000 mL | Freq: Two times a day (BID) | OROMUCOSAL | Status: DC
  Administered 2019-05-04: 15 mL via OROMUCOSAL

## 2019-05-04 MED ORDER — SODIUM BICARBONATE 8.4 % IV SOLN
50.0000 meq | Freq: Once | INTRAVENOUS | Status: AC
  Administered 2019-05-04: 50 meq via INTRAVENOUS
  Filled 2019-05-04: qty 50

## 2019-05-04 MED ORDER — DEXTROSE 5 % IV SOLN: INTRAVENOUS | Status: DC

## 2019-05-04 MED ORDER — HEPARIN SODIUM (PORCINE) 5000 UNIT/ML IJ SOLN: 5000.0000 [IU] | Freq: Three times a day (TID) | INTRAMUSCULAR | Status: DC

## 2019-05-04 MED ORDER — FENTANYL 2500MCG IN NS 250ML (10MCG/ML) PREMIX INFUSION: 0.0000 ug/h | INTRAVENOUS | Status: DC

## 2019-05-04 MED ORDER — BISACODYL 10 MG RE SUPP: 10.0000 mg | Freq: Every day | RECTAL | Status: DC | PRN

## 2019-05-04 MED ORDER — MIDAZOLAM HCL 2 MG/2ML IJ SOLN
INTRAMUSCULAR | Status: AC
  Filled 2019-05-04: qty 2

## 2019-05-04 MED ORDER — ACETAMINOPHEN 325 MG PO TABS
650.0000 mg | ORAL_TABLET | ORAL | Status: DC | PRN
  Administered 2019-05-04: 650 mg via ORAL
  Filled 2019-05-04: qty 2

## 2019-05-04 MED ORDER — MIDAZOLAM HCL 2 MG/2ML IJ SOLN: 2.0000 mg | INTRAMUSCULAR | Status: DC | PRN

## 2019-05-04 MED ORDER — MIDAZOLAM HCL 2 MG/2ML IJ SOLN
2.0000 mg | INTRAMUSCULAR | Status: AC
  Administered 2019-05-04: 2 mg via INTRAVENOUS

## 2019-05-04 MED ORDER — PIPERACILLIN-TAZOBACTAM 3.375 G IVPB
3.3750 g | Freq: Two times a day (BID) | INTRAVENOUS | Status: DC
  Filled 2019-05-04: qty 50

## 2019-05-06 LAB — CULTURE, RESPIRATORY W GRAM STAIN

## 2019-05-09 LAB — CULTURE, BLOOD (ROUTINE X 2)
Culture: NO GROWTH
Culture: NO GROWTH
Special Requests: ADEQUATE
Special Requests: ADEQUATE

## 2019-05-23 NOTE — Progress Notes (Signed)
CRITICAL VALUE ALERT  Critical Value:  Lactic Acid 2.8; Troponin 781  Date & Time Notied:  2019/05/27 @0513   Provider Notified: Dr. Gilford Raid, CCM MD  Jackalyn Lombard

## 2019-05-23 NOTE — Progress Notes (Signed)
Was notified by nurse of status change to comfort care.  Some family members had come to see/visit patient and were allowed 2 at a time.  Prayers for comfort and love to be known by patient and thanks given for the medical team and their care and for the patient and family during this time.  Conard Novak, Chaplain   06-May-2019 1900  Clinical Encounter Type  Visited With Patient and family together  Visit Type Spiritual support  Referral From Nurse  Consult/Referral To Chaplain  Spiritual Encounters  Spiritual Needs Prayer  Stress Factors  Patient Stress Factors Health changes  Family Stress Factors Major life changes

## 2019-05-23 NOTE — Progress Notes (Signed)
[place] Progress Note Patient Name: Tony Berry DOB: March 20, 1961 MRN: [id number]   Date of Service  06/02/2019  HPI/Events of Note  Pt transferred from [facility] [place] ED with acute hypoxemic respiratory failure, septic shock, and multiple systems organ failure.  eICU Interventions  New Patient Evaluation completed, PCCM at the bedside asked to see the patient and admit him.        Kerry Kass Dacotah Cabello Jun 02, 2019, 3:29 AM

## 2019-05-23 NOTE — Procedures (Addendum)
Tracheostomy Change Note  Patient Details:   Name: GRAYLON AMIE DOB: [birth year] MRN: [id number]    Airway Documentation: Pt known to [facility] clinic previously had a size 4 cuffless which was exchanged at [facility] for a size 7 ETT Connected to mechanical vent PRVC FiO2 50% PEEP 5 TV 8cc/kg  Roll placed under patient's neck for extension ETT holder removed from neck and skin cleaned with clorrhexidine AMBU bag placed over mouth and nares used to provide passive oxygenation during exchange. Pt given versed 2 mg and on fentanyl gtt at 50% Bougie inserted into size 7 ETT and exchanged for a size 4 cuffed Shiley trach Inner cannula placed Pt connected to vent with ETCO2 capnography showing color change. Good VTi and VTe on vent  Evaluation  O2 sats: transiently fell during during procedure Complications: No apparent complications Patient did tolerate procedure well. Bilateral breath sounds appreciated, coarse in nature unchanged from previous    Kandice Hams 05/20/19, 5:42 AM

## 2019-05-23 NOTE — Progress Notes (Signed)
Pharmacy Antibiotic Note  Lennis JAMIS ENNEKING is a 59 y.o. male admitted on 06-01-19 with pneumonia and sepsis.  Pharmacy has been consulted for Vancocin and Zosyn dosing.  Pt w/ AKI, SCr 3.4 (down from 4.4 hours prior), baseline <0.8.  Rec'd azithro, vanc, and Zosyn at OSH.  Plan: Rec'd vanc 1500mg  IV at [facility] (2/10 0015); will monitor SCr +/- levels prior to redosing. Zosyn 3.375g IV Q8H (4-hour infusion).  Height: 5\' 6"  (167.6 cm) Weight: 125 lb (56.7 kg) IBW/kg (Calculated) : 63.8  Temp (24hrs), Avg:97.7 F (36.5 C), Min:97.5 F (36.4 C), Max:97.9 F (36.6 C)  No Known Allergies   Thank you for allowing pharmacy to be a part of this patient's care.  Wynona Neat, PharmD, BCPS  06-01-2019 4:11 AM

## 2019-05-23 NOTE — Death Summary Note (Signed)
DEATH SUMMARY   Patient Details  Name: Tony Berry MRN: [id number] DOB: 1960/12/06  Admission/Discharge Information   Admit Date:  05/15/19  Date of Death: Date of Death: May 15, 2019  Time of Death: Time of Death: June 26, 2205  Length of Stay: 1  Referring Physician: Cher Nakai, MD   Reason(s) for Hospitalization  septic shock  Diagnoses  Preliminary cause of death: Septic shock ([place]) Secondary Diagnoses (including complications and co-morbidities):  Active Problems:   Acute on chronic respiratory failure (HCC)   History of tracheostomy   Septic shock (HCC)   Acute kidney injury ([place])   Shock ([place])   Acute liver failure without hepatic coma   Brief Hospital Course (including significant findings, care, treatment, and services provided and events leading to death)  Shavar BRYNE WIESSNER is a 59 y.o. year old male who has a PMH esophageal  Cancer, COPD, s/p tracheostomy, HTN, chronic pain who presents from OSH with AMS. Per [place] HPI, "presented to emergency department via private vehicle. The vehicle pulled up outside the emergency department honking the horn. Patient was unresponsive in the care. He was pulled from the car with no respirations however a pulse intact. After patient was removed from the care the car sped away without giving any information." The patient was given narcan at [facility] ED with return of spontaneous respirations. He was placed on a vent and sedated with propofol, at which time it was noted that patient's trach is cuffless, and a 7.0 ETT was exchanged in its place. At [facility] noted to have acute hypercarbic respiratory failure, acute liver injury, AKI with hyperkalemia, mildly elevated troponin and shock. He was transferred to [facility] ICU for further workup and care.   Upon arrival to our ICU pt was intubated and sedated. He was on levo gtt to maintain pressors. His ett was exchanged for trach thru his stoma. He was appropriate resuscitated but despite our best efforts pt  cont to decline ultimately req multiple pressors.  Progressive anuric renal failure with hyperkalemia. Wife was able to come be with pt and decision was made to transition to comfort care.   Pt expired later that evening.    Pertinent Labs and Studies  Significant Diagnostic Studies ECHOCARDIOGRAM COMPLETE  Result Date: 05/15/2019    ECHOCARDIOGRAM REPORT   Patient Name:   [name] Date of Exam: 05/15/2019 Medical Rec #:  [id number]      Height:       68.0 in Accession #:    [id number]     Weight:       125.0 lb Date of Birth:  07-15-1960      BSA:          1.67 m Patient Age:    59 years       BP:           113/66 mmHg Patient Gender: M              HR:           87 bpm. Exam Location:  Inpatient Procedure: 2D Echo, Cardiac Doppler and Color Doppler Indications:    Shock  History:        Patient has prior history of Echocardiogram examinations, most                 recent 03/11/2018. Risk Factors:Former Smoker. Elevated                 troponin. Sepsis.  Sonographer:    Vickie Epley  RDCS Referring Phys: [id number] [place]  1. Left ventricular ejection fraction, by estimation, is 65 to 70%. The left ventricle has normal function. The left ventrical has no regional wall motion abnormalities. Left ventricular diastolic parameters are indeterminate.  2. Right ventricular systolic function is normal. The right ventricular size is normal.  3. There is a mobile density in the RA cavity. 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  4. Trivial mitral valve regurgitation.  5. The aortic valve is normal in structure and function. Aortic valve regurgitation is not visualized. No aortic stenosis is present.  6. The inferior vena cava is normal in size with greater than 50% respiratory variability, suggesting right atrial pressure of 3 mmHg. FINDINGS  Left Ventricle: Left ventricular ejection fraction, by estimation, is 65 to 70%. The left ventricle has normal  function. The left ventricle has no regional wall motion abnormalities. The left ventricular internal cavity size was normal in size. There is  no left ventricular hypertrophy. Left ventricular diastolic parameters are indeterminate. Right Ventricle: The right ventricular size is normal. No increase in right ventricular wall thickness. Right ventricular systolic function is normal. Left Atrium: Left atrial size was normal in size. Right Atrium: There is a mobile density in the RA cavity. 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 Right atrial size was normal in size. Prominent Chiari network. Pericardium: There is no evidence of pericardial effusion. Mitral Valve: The mitral valve is normal in structure and function. Normal mobility of the mitral valve leaflets. Trivial mitral valve regurgitation. No evidence of mitral valve stenosis. Tricuspid Valve: The tricuspid valve is normal in structure. Tricuspid valve regurgitation is not demonstrated. No evidence of tricuspid stenosis. Aortic Valve: The aortic valve is normal in structure and function.. There is moderate thickening and moderate calcification of the aortic valve. Aortic valve regurgitation is not visualized. No aortic stenosis is present. There is moderate thickening of  the aortic valve. There is moderate calcification of the aortic valve. Pulmonic Valve: The pulmonic valve was normal in structure. Pulmonic valve regurgitation is trivial. No evidence of pulmonic stenosis. Aorta: The aortic root is normal in size and structure. Venous: The inferior vena cava is normal in size with greater than 50% respiratory variability, suggesting right atrial pressure of 3 mmHg. The inferior vena cava and the hepatic vein show a normal flow pattern. IAS/Shunts: No atrial level shunt detected by color flow Doppler.  LEFT VENTRICLE PLAX 2D LVIDd:         3.70 cm  Diastology LVIDs:         2.70 cm  LV e' lateral:   5.22 cm/s LV PW:          0.70 cm  LV E/e' lateral: 14.1 LV IVS:        0.70 cm  LV e' medial:    6.85 cm/s LVOT diam:     1.80 cm  LV E/e' medial:  10.7 LV SV:         43.77 ml LV SV Index:   18.97 LVOT Area:     2.54 cm  RIGHT VENTRICLE RV S prime:     6.53 cm/s TAPSE (M-mode): 1.4 cm LEFT ATRIUM             Index       RIGHT ATRIUM           Index LA diam:  2.20 cm 1.31 cm/m  RA Area:     10.40 cm LA Vol (A2C):   20.7 ml 12.37 ml/m RA Volume:   20.10 ml  12.01 ml/m LA Vol (A4C):   37.5 ml 22.41 ml/m LA Biplane Vol: 28.1 ml 16.79 ml/m  AORTIC VALVE LVOT Vmax:   117.00 cm/s LVOT Vmean:  83.300 cm/s LVOT VTI:    0.172 m  AORTA Ao Root diam: 2.60 cm MITRAL VALVE MV Area (PHT): 3.46 cm             SHUNTS MV Decel Time: 219 msec             Systemic VTI:  0.17 m MV E velocity: 73.50 cm/s 103 cm/s  Systemic Diam: 1.80 cm MV A velocity: 64.30 cm/s 70.3 cm/s MV E/A ratio:  1.14       1.5 Fransico Him MD Electronically signed by Fransico Him MD Signature Date/Time: May 08, 2019/10:02:51 AM    Final     Microbiology Recent Results (from the past 240 hour(s))  MRSA PCR Screening     Status: None   Collection Time: May 08, 2019  3:08 AM   Specimen: Nasopharyngeal  Result Value Ref Range Status   MRSA by PCR NEGATIVE NEGATIVE Final    Comment:        The GeneXpert MRSA Assay (FDA approved for NASAL specimens only), is one component of a comprehensive MRSA colonization surveillance program. It is not intended to diagnose MRSA infection nor to guide or monitor treatment for MRSA infections. Performed at [facility], [place] [address] [place], [place] [address]   Culture, respiratory (non-expectorated)     Status: None (Preliminary result)   Collection Time: 05-08-19  4:03 AM   Specimen: Tracheal Aspirate; Respiratory  Result Value Ref Range Status   Specimen Description TRACHEAL ASPIRATE  Final   Special Requests NONE  Final   Gram Stain   Final    ABUNDANT WBC PRESENT, PREDOMINANTLY PMN ABUNDANT GRAM NEGATIVE  RODS RARE GRAM POSITIVE COCCI    Culture   Final    ABUNDANT GRAM NEGATIVE RODS CULTURE REINCUBATED FOR BETTER GROWTH Performed at [facility], [place] [address], [place], [place] [address]    Report Status PENDING  Incomplete  Respiratory Panel by PCR     Status: Abnormal   Collection Time: May 08, 2019  5:32 AM   Specimen: Nasopharyngeal Swab; Respiratory  Result Value Ref Range Status   Adenovirus NOT DETECTED NOT DETECTED Final   Coronavirus 229E NOT DETECTED NOT DETECTED Final    Comment: (NOTE) The Coronavirus on the Respiratory Panel, DOES NOT test for the novel  Coronavirus (2019 nCoV)    Coronavirus HKU1 NOT DETECTED NOT DETECTED Final   Coronavirus NL63 NOT DETECTED NOT DETECTED Final   Coronavirus OC43 NOT DETECTED NOT DETECTED Final   Metapneumovirus NOT DETECTED NOT DETECTED Final   Rhinovirus / Enterovirus DETECTED (A) NOT DETECTED Final   Influenza A NOT DETECTED NOT DETECTED Final   Influenza B NOT DETECTED NOT DETECTED Final   Parainfluenza Virus 1 NOT DETECTED NOT DETECTED Final   Parainfluenza Virus 2 NOT DETECTED NOT DETECTED Final   Parainfluenza Virus 3 NOT DETECTED NOT DETECTED Final   Parainfluenza Virus 4 NOT DETECTED NOT DETECTED Final   Respiratory Syncytial Virus NOT DETECTED NOT DETECTED Final   Bordetella pertussis NOT DETECTED NOT DETECTED Final   Chlamydophila pneumoniae NOT DETECTED NOT DETECTED Final   Mycoplasma pneumoniae NOT DETECTED NOT DETECTED Final    Comment:  Performed at [facility], [place] [address], [place], [place] [address]  Culture, blood (Routine X 2) w Reflex to ID Panel     Status: None (Preliminary result)   Collection Time: 2019/05/23  6:06 AM   Specimen: BLOOD  Result Value Ref Range Status   Specimen Description BLOOD LEFT UPPER ARM  Final   Special Requests   Final    BOTTLES DRAWN AEROBIC AND ANAEROBIC Blood Culture adequate volume   Culture   Final    NO GROWTH 1 DAY Performed at [facility], [place]  [address], [place], [place] [address]    Report Status PENDING  Incomplete  Culture, blood (Routine X 2) w Reflex to ID Panel     Status: None (Preliminary result)   Collection Time: May 23, 2019  6:06 AM   Specimen: BLOOD  Result Value Ref Range Status   Specimen Description BLOOD LEFT ANTECUBITAL  Final   Special Requests   Final    BOTTLES DRAWN AEROBIC AND ANAEROBIC Blood Culture adequate volume   Culture   Final    NO GROWTH 1 DAY Performed at [facility], [place] [address], [place], [place] [address]    Report Status PENDING  Incomplete    Lab Basic Metabolic Panel: Recent Labs  Lab May 23, 2019 0333 May 23, 2019 0411 05-23-2019 1400  NA 125* 125*  --   K 5.7* 5.2* 5.5*  CL 87*  --   --   CO2 25  --   --   GLUCOSE 131*  --   --   BUN 79*  --   --   CREATININE 4.33*  --   --   CALCIUM 6.5*  --   --   MG 2.0  --   --   PHOS 7.1*  --   --    Liver Function Tests: Recent Labs  Lab 05/23/19 0333  AST 5,246*  ALT 2,758*  ALKPHOS 110  BILITOT 1.5*  PROT 5.6*  ALBUMIN 2.6*   No results for input(s): LIPASE, AMYLASE in the last 168 hours. Recent Labs  Lab 05/23/19 0423  AMMONIA 25   CBC: Recent Labs  Lab May 23, 2019 0333 2019/05/23 0411 05-23-2019 0606  WBC 3.1*  --   --   NEUTROABS 0.9*  --   --   HGB 11.6* 11.9*  --   HCT 37.1* 35.0*  --   MCV 90.0  --   --   PLT 168  --  170   Cardiac Enzymes: Recent Labs  Lab 2019/05/23 0333  CKTOTAL 6,829*  CKMB 82.6*   Sepsis Labs: Recent Labs  Lab 05-23-19 0333 05-23-2019 0423 05-23-19 0855  PROCALCITON 63.81  --   --   WBC 3.1*  --   --   LATICACIDVEN  --  2.8* 2.4*    Procedures/Operations  See chart    Audria Nine 05/05/2019, 12:18 PM

## 2019-05-23 NOTE — Progress Notes (Addendum)
Pt cont to decline. On max dose pressors. Escalating oxygen requirement.   Despite attempts at medical therapies to lower K we have been unsuccessful. Pt remains anuric and suspect renal function will cont to decline.   Adding vasopressin Will give 1 amp bicarb  Updated wife via phone. She states the pt is "tired" and "ready to go". She has struggled with the acceptance of that but knows that he is dying.   At this time she is trying to make it to the hospital. She would like to be with him when he transitions to comfort care.  I explained we will do what we can to maintain life until she arrives but he may expire prior to that, to which she acknowledged.   Pt remains DNR RN updated.   Critical care time: The patient is critically ill with multiple organ systems failure and requires high complexity decision making for assessment and support, frequent evaluation and titration of therapies, application of advanced monitoring technologies and extensive interpretation of multiple databases.  Critical care time 42 additional mins. This represents my time independent of the NPs time taking care of the pt. This is excluding procedures.    [facility] May 11, 2019, 4:57 PM

## 2019-05-23 NOTE — Progress Notes (Signed)
  Echocardiogram 2D Echocardiogram has been performed.  Michiel Cowboy 05/10/19, 8:36 AM

## 2019-05-23 NOTE — Progress Notes (Signed)
Wasted 3mL/50 mcg of fentanyl in sink with Raynald Kemp, RN.

## 2019-05-23 NOTE — H&P (Addendum)
NAME:  Tony Berry, MRN:  [id number], DOB:  Dec 15, 1960, LOS: 0 ADMISSION DATE:  05-22-2019, CONSULTATION DATE:  22-May-2019 REFERRING MD:  Calla Kicks EM, CHIEF COMPLAINT:  Shock   Brief History   59 yo hx head/neck cancer, s.p trach from [place] with shock   History of present illness   59 yo M PMH esophageal  Cancer, COPD, s/p tracheostomy, HTN, chronic pain who presents from OSH with AMS. Per [place] HPI, "presented to emergency department via private vehicle. The vehicle pulled up outside the emergency department honking the horn. Patient was unresponsive in the care. He was pulled from the car with no respirations however a pulse intact. After patient was removed from the care the car sped away without giving any information." The patient was given narcan at [facility] ED with return of spontaneous respirations. He was placed on a vent and sedated with propofol, at which time it was noted that patient's trach is cuffless, and a 7.0 ETT was exchanged in its place. At [facility] noted to have acute hypercarbic respiratory failure, acute liver injury, AKI with hyperkalemia, mildly elevated troponin and shock. He was transferred to [facility] ICU for further workup and care.   CRP 319, LDH 51001, Ferritin 19600  PCT 38 Lactic Acid 11.5 Na 126 K 6.1 Cr 3.4 BUN 79 UDS negative SARS Cov2 negative  proBNP 31800   Past Medical History  Esophageal cancer  HTN Chronic pain  COPD S/p tracheostomy  Significant Hospital Events   2/9 Presented to [facility] ED, started on pressors  2/10 transfer to [facility] ICU   Consults:    Procedures:  2/9 R fem CVC ([place])>   Significant Diagnostic Tests:  RUQ [place] 2/9> minimal gallbladder sludge no acute cholecystitis 2/9 CT H> no acute intracranial abnormality 2/9 CT c/a/p> patchy ground glass consolidations, dense bibasilar consolidations. Emphysematous changes. Mild prominence of biliary tree.  Micro Data:  2/9 SARS Cov2> neg 2/9 Flu A/B>  neg  Antimicrobials:  2/9 Vanc  2/9 Azithrymycin 2/9 pip tazo Interim history/subjective:  Propofol   Objective   There were no vitals taken for this visit.       No intake or output data in the 24 hours ending 05/22/19 0322 There were no vitals filed for this visit.  Examination: General: Frail, chronically and critically ill appearing male, apperas older than stated age  HENT: NCAT. 7.0 ETT in tracheostomy site, secure.  Lungs: Scattered rhonchi. Symmetrical chest expansion. No accessory muscle use  Cardiovascular: RRR s1s2 no rgm  Abdomen: + PEG. Soft flat ndnt  Extremities: symmetrical bulk and tone, no cyanosis or clubbing.  Neuro: Does not follow commands, withdrawals to pain. PERRL  GU: foley with yellow urine  Resolved Hospital Problem list     Assessment & Plan:   Acute on chronic respiratory failure with hypercarbia, S/p Tracheostomy -COPD hx P -Continue MV support -Will need ETT exchanged for trach  -AM CXR -empiric abx (vanc, zosyn per pharmacy) -RVP  Shock -suspected septic shock, possible component of hypovolemic, possible R sided heart failure with  P -ECHO -1L IVF  -pressors for MAP gaol > 65 via CVC  -follow up culture data  -empiric abx   Acute Encephalopathy -suspect toxic metabolic, possible HE, elevated BUN could have uremic component  P -Ammonia -Correct metabolic abnormalities -Delirium precautions -repeat metabolic panel pending   Possible R sided heart failure -elevated LDH, proBNP Elevated troponin, mild -"grey zone" troponin of 0.39 at [place] with elevated CKMB 44.8 P -ECHO -trend  troponin for completeness -recheck CK, CKMB  Esophageal cancer s/p radiation Recurrent aspiration P -Supportive care -EN per PEG   AKI Hyperkalemia P -recheck now  -Strict I/O   Acute liver injury -AST 13896, ALT 5464, alk phos 115 -suspect early shock liver vs possible R sided heart failure -RUQ [place] at outside hospital without  evidence of cholecystitis  P -recheck LFTs -dc propofol  -trend coags   PEG site bleeding Foley catheter bleeding -possible traumatic bleeding related to insertion, movement during transport P -DIC panel in setting of multisystem organ dysfunction  -will dc SQ heparin and use SCD only  -CBC pending   Hx HTN P -ICU monitoring -holding antihypertensives in setting of shock   Candida infection P -Topical nystatin to trach and peg sites Best practice:  Diet: EN per PEG Pain/Anxiety/Delirium protocol (if indicated): PRN fent VAP protocol (if indicated): yes DVT prophylaxis: SCD GI prophylaxis: protonix Glucose control: monitor Mobility: BR Code Status: DNR  Family Communication: pending Disposition: ICU  Labs   CBC: No results for input(s): WBC, NEUTROABS, HGB, HCT, MCV, PLT in the last 168 hours.  Basic Metabolic Panel: No results for input(s): NA, K, CL, CO2, GLUCOSE, BUN, CREATININE, CALCIUM, MG, PHOS in the last 168 hours. GFR: CrCl cannot be calculated (Patient's most recent lab result is older than the maximum 21 days allowed.). No results for input(s): PROCALCITON, WBC, LATICACIDVEN in the last 168 hours.  Liver Function Tests: No results for input(s): AST, ALT, ALKPHOS, BILITOT, PROT, ALBUMIN in the last 168 hours. No results for input(s): LIPASE, AMYLASE in the last 168 hours. No results for input(s): AMMONIA in the last 168 hours.  ABG    Component Value Date/Time   PHART 7.537 (H) 11/20/2018 1716   PCO2ART 48.0 11/20/2018 1716   PO2ART 100.0 11/20/2018 1716   HCO3 40.8 (H) 11/20/2018 1716   TCO2 42 (H) 11/20/2018 1716   O2SAT 98.0 11/20/2018 1716     Coagulation Profile: No results for input(s): INR, PROTIME in the last 168 hours.  Cardiac Enzymes: No results for input(s): CKTOTAL, CKMB, CKMBINDEX, TROPONINI in the last 168 hours.  HbA1C: Hgb A1c MFr Bld  Date/Time Value Ref Range Status  10/27/2018 09:58 AM 5.5 4.8 - 5.6 % Final    Comment:     (NOTE) Pre diabetes:          5.7%-6.4% Diabetes:              >6.4% Glycemic control for   <7.0% adults with diabetes     CBG: No results for input(s): GLUCAP in the last 168 hours.  Review of Systems:   Unable to obtain; patient arrives sedated on proprofol   Past Medical History  He,  has a past medical history of Esophageal cancer ([place]), Head and neck cancer ([place]), and Hypertension.   Surgical History    Past Surgical History:  Procedure Laterality Date  . BACK SURGERY  2008, 2011  . FOOT SURGERY Right   . IR GASTROSTOMY TUBE MOD SED  04/08/2018  . LEG SURGERY Right      Social History   reports that he quit smoking about 14 months ago. His smoking use included cigarettes. He started smoking about 31 years ago. He has a 30.00 pack-year smoking history. He has quit using smokeless tobacco. He reports current alcohol use. He reports that he does not use drugs.   Family History   His family history includes CAD in his father; COPD in his father, mother,  and sister; Lung cancer in his mother; Pneumonia in his father. There is no history of Colon cancer.   Allergies No Known Allergies   Home Medications  Prior to Admission medications   Medication Sig Start Date End Date Taking? Authorizing Provider  albuterol (PROVENTIL) (2.5 MG/3ML) 0.083% nebulizer solution Take 2.5 mg by nebulization every 6 (six) hours as needed for wheezing or shortness of breath.     [provider]  albuterol (VENTOLIN HFA) 108 (90 Base) MCG/ACT inhaler Inhale 2 puffs into the lungs every 6 (six) hours as needed for wheezing or shortness of breath. 10/02/18   Mercy Riding, MD  aspirin 81 MG chewable tablet Chew 1 tablet (81 mg total) by mouth daily. Please do not take for five days before your gastrostomy tube placement procedure Patient taking differently: Place 81 mg into feeding tube daily.  03/16/18   Asencion Noble, MD  fluconazole (DIFLUCAN) 100 MG tablet Take 1 tablet (100 mg  total) by mouth daily for 14 days. 04/21/19 05/05/19  Erick Colace, NP  formoterol (PERFOROMIST) 20 MCG/2ML nebulizer solution Take 20 mcg by nebulization 2 (two) times daily.    [provider]  furosemide (LASIX) 20 MG tablet Take 1 tablet (20 mg total) by mouth 2 (two) times daily. Patient not taking: Reported on 02/28/2019 10/01/18   Mercy Riding, MD  lidocaine (XYLOCAINE) 5 % ointment Apply 1 application topically as needed. 04/25/19   Erick Colace, NP  magic mouthwash SOLN Take 5 mLs by mouth 2 (two) times daily. Swish do not swallow 03/22/19   [name], Bradley L, DO  nystatin cream (MYCOSTATIN) 2 (two) times daily. 01/12/19   [provider]  ondansetron (ZOFRAN) 4 MG tablet Place 4 mg into feeding tube every 8 (eight) hours as needed for nausea or vomiting.     [provider]  Oxycodone HCl 10 MG TABS Take 10 mg by mouth every 6 (six) hours as needed. 12/18/18   [provider]  OXYGEN Inhale 5 L into the lungs continuous.     [provider]  senna-docusate (SENOKOT-S) 8.6-50 MG tablet Place 1 tablet into feeding tube daily.    [provider]  sodium chloride (OCEAN) 0.65 % SOLN nasal spray Place 1 spray into both nostrils as needed for congestion.    [provider]  Water For Irrigation, Sterile (FREE WATER) SOLN Place 120 mLs into feeding tube 4 (four) times daily. 12/13/18   Barb Merino, MD     Critical care time: 78 minutes        Eliseo Gum MSN, AGACNP-BC [place] [phone] If no answer, [phone] 2019/05/16, 3:22 AM

## 2019-05-23 NOTE — Procedures (Signed)
Bronchoscopy Procedure Note Tony Berry [id number] 07/15/1960  Procedure: Bronchoscopy Indications: Diagnostic evaluation of the airways and Remove secretions  Procedure Details Consent: Unable to obtain consent because of emergent medical necessity. Time Out: Verified patient identification, verified procedure, site/side was marked, verified correct patient position, special equipment/implants available, medications/allergies/relevent history reviewed, required imaging and test results available.  Performed  In preparation for procedure, patient was given 100% FiO2 and bronchoscope lubricated. Sedation: Benzodiazepines and fentanyl gtt  Airway entered and the following bronchi were examined: RML, RLL, LUL and LLL.   Procedures performed: Brushings performed Bronchoscope removed.  , Patient placed back on 100% FiO2 at conclusion of procedure.    Evaluation Hemodynamic Status: BP stable throughout; O2 sats: stable throughout Patient's Current Condition: stable Specimens:  None  ( tracheal aspirate collected earlier by RT) Complications: No apparent complications Patient did tolerate procedure well.   Tony Berry 06/02/19

## 2019-05-23 NOTE — Progress Notes (Signed)
Wasted 150 mL of fentanyl with Theadora Rama, Therapist, sports.   Jackalyn Lombard

## 2019-05-23 DEATH — deceased

## 2020-11-22 IMAGING — CT CT CHEST W/ CM
2 of 3 series · 15 of 36 positions shown, 18 images · IV contrast (iopamidol)
Comparison: Chest CT 03/18/2018

CLINICAL DATA: History esophageal cancer status post radiation and
history COPD. History of aspiration pneumonia.

EXAM:
CT CHEST WITH CONTRAST
TECHNIQUE: Multidetector CT imaging of the chest was performed during
intravenous contrast administration.
CONTRAST:  75mL NERQ4T-B22 IOPAMIDOL (NERQ4T-B22) INJECTION 61%

[Series 3: chest with 2mm st · axial · 0.73mm/px · z∈[+1052,+1392]mm · 12 of 200 slices shown, 15 images]
[im 15/200  mediastinal]
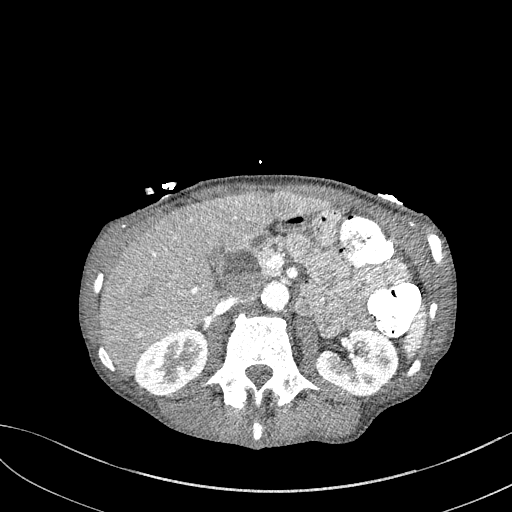
[im 15/200  lung]
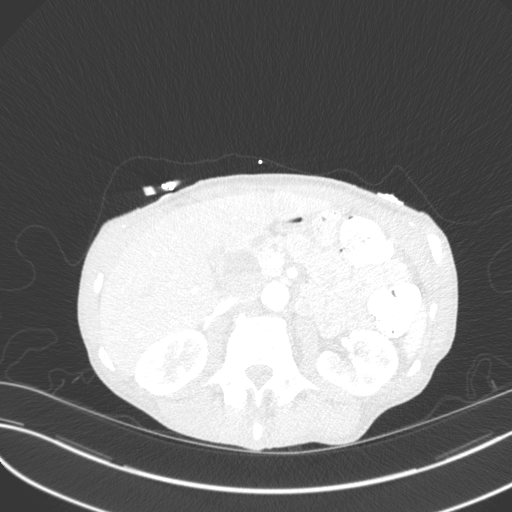
[im 30/200  lung]
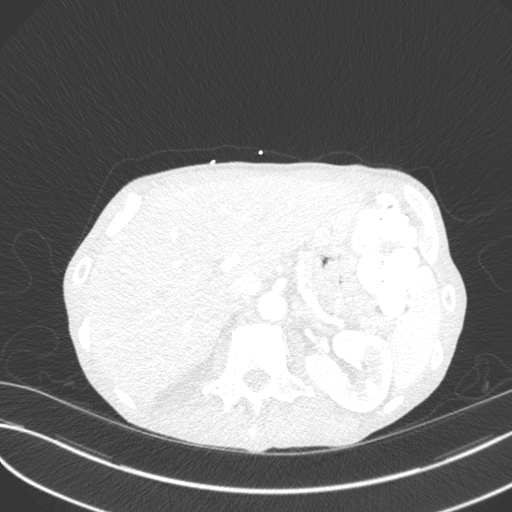
[im 45/200  lung]
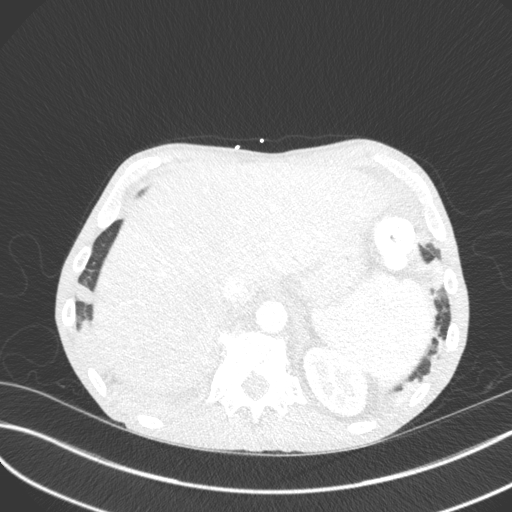
[im 59/200  lung]
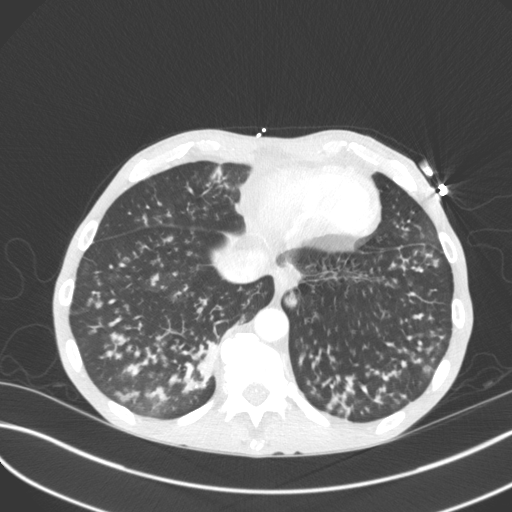
[im 74/200  mediastinal]
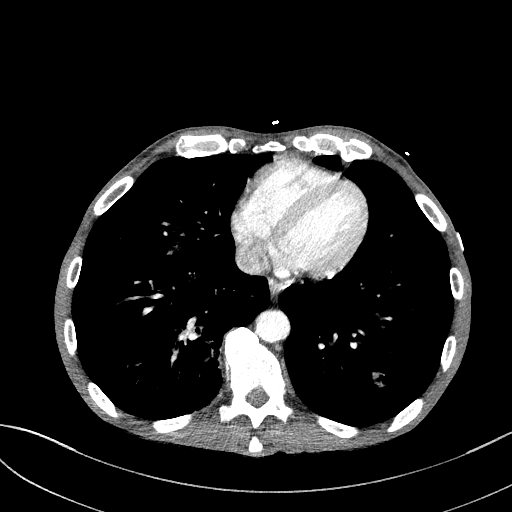
[im 74/200  lung]
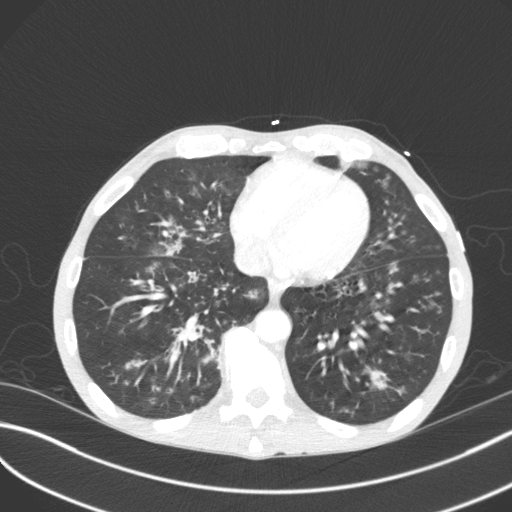
[im 89/200  lung]
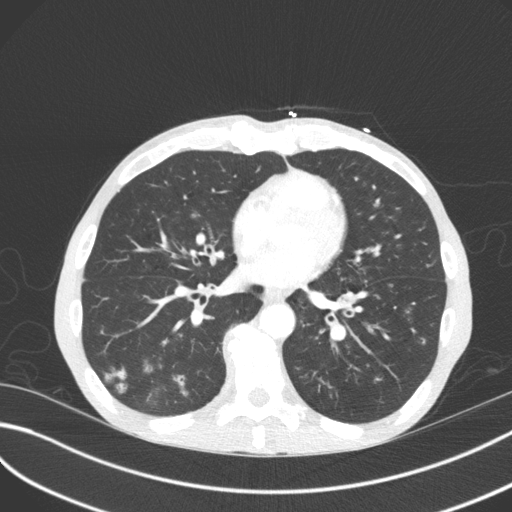
[im 111/200  lung]
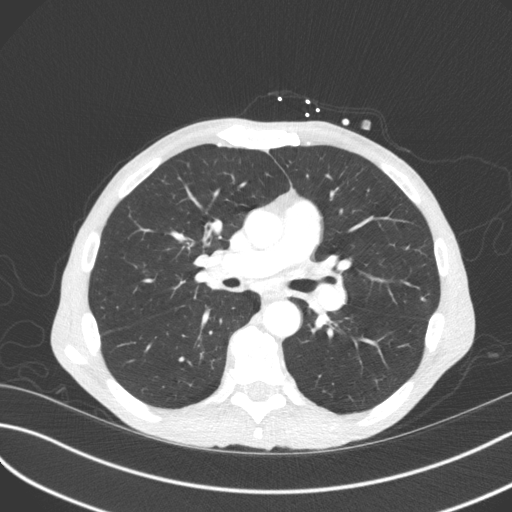
[im 126/200  lung]
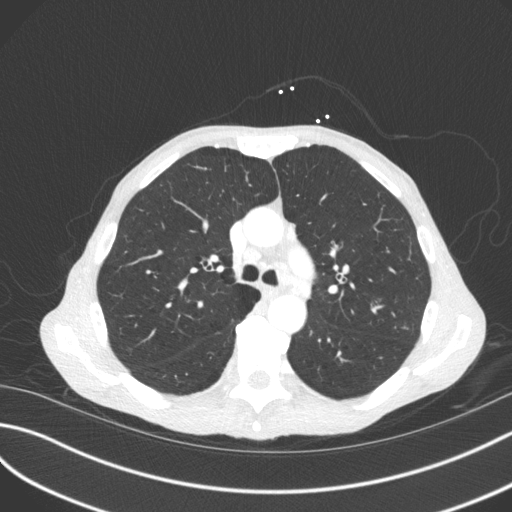
[im 141/200  mediastinal]
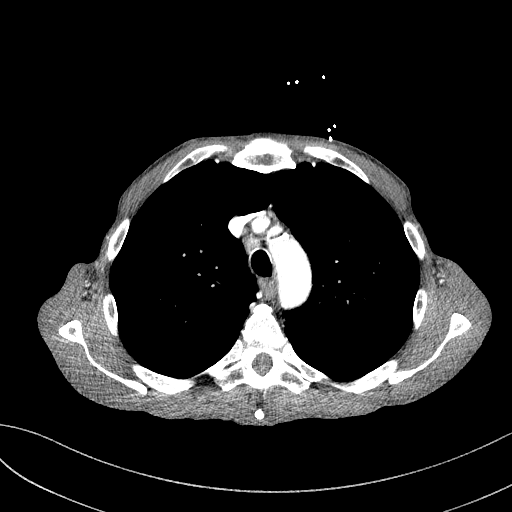
[im 141/200  lung]
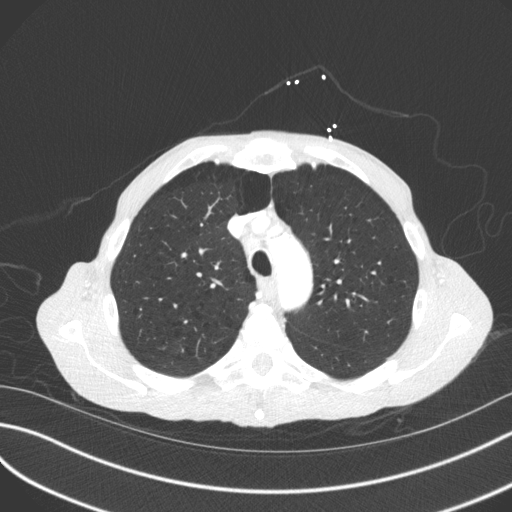
[im 155/200  lung]
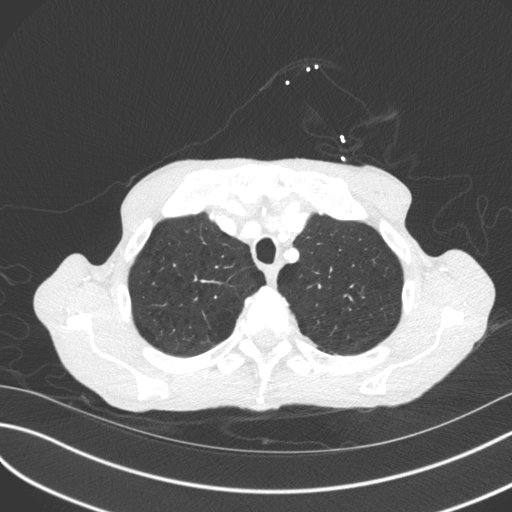
[im 170/200  lung]
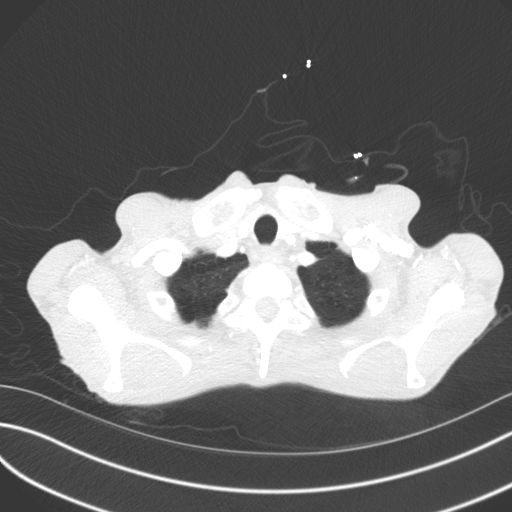
[im 185/200  lung]
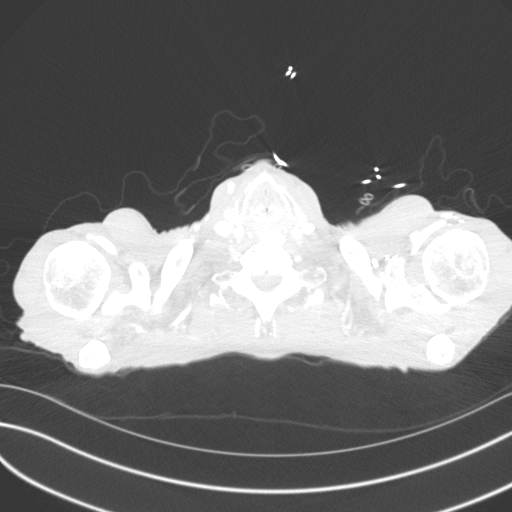

[Series 5: chest with 3mm st cor · coronal · 0.84mm/px · 3 of 100 slices shown]
[im 20/100  lung]
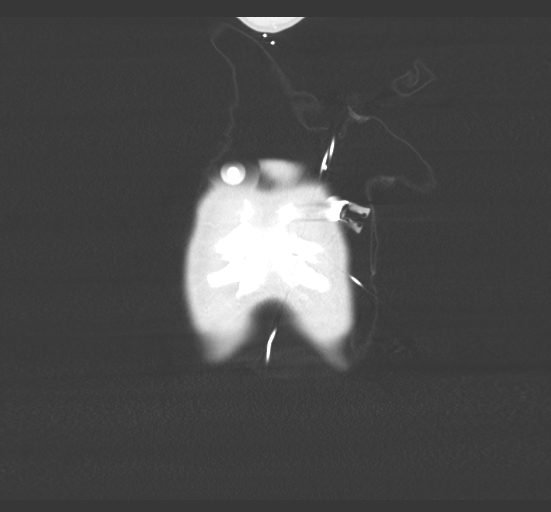
[im 40/100  lung]
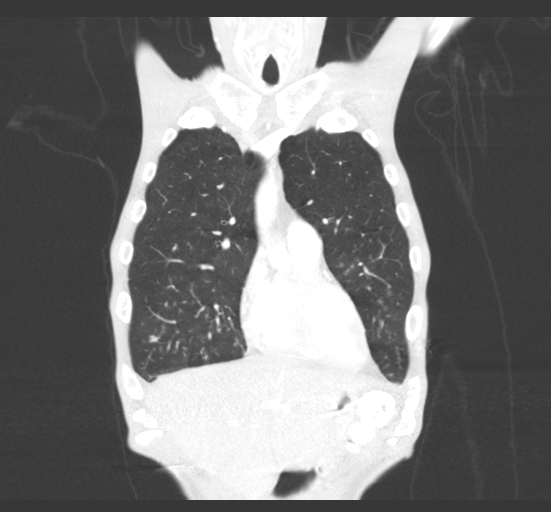
[im 60/100  lung]
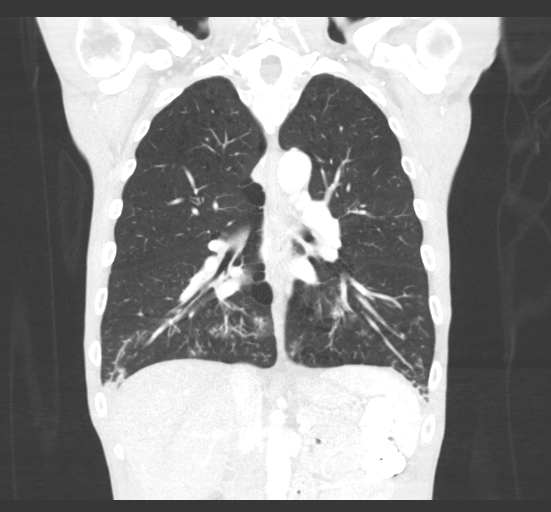

[15 of 36 positions shown; findings below may reference images not displayed]

FINDINGS: Cardiovascular: Normal heart size. No pericardial effusion. Thoracic
aortic vascular calcifications.

Mediastinum/Nodes: Similar-appearing prominent and mildly enlarged
mediastinal and hilar adenopathy including a 0.7 cm superior
mediastinal node (image 49; series 3), a 0.9 cm subcarinal node
(image 93; series 3), a 1.1 cm right hilar node (image 90; series 3)
and a 0.9 cm left hilar node. Stable appearance of the esophagus.

Lungs/Pleura: Central airways are patent. Centrilobular and
paraseptal emphysematous changes. Persistent bronchial wall
thickening predominately involving the right middle lobe and
bilateral lower lobes. Endobronchial material demonstrated within
the right middle lobe, lingula, left lower lobe and right lower
lobe. When compared to prior exam there has been interval increase
in tree-in-bud nodularity within the lingula and right lower lobe
with improvement in the left lower lobe. Reference worsened
tree-in-bud nodularity right lower lobe (image 108; series 4).
Similar findings within the right middle lobe. No pleural effusion
or pneumothorax.

Upper Abdomen: Unremarkable.

Musculoskeletal: Thoracic spine degenerative changes. No aggressive
or acute appearing osseous lesions.
IMPRESSION: 1. Persistent waxing and waning of the basilar predominant
tree-in-bud nodular opacities and associated consolidation with
interval worsening involving the lingula and right lower lobe. Mild
improvement within the left lower lobe. Worsening findings may be
secondary to aspiration or multifocal pneumonia.
2. Mild mediastinal and hilar adenopathy likely reactive.
3. Aortic Atherosclerosis (O8LUJ-2TX.X) and Emphysema (O8LUJ-NP1.6).

## 2021-06-13 IMAGING — DX PORTABLE ABDOMEN - 1 VIEW
1 series · 1 of 1 positions shown · non-contrast
Comparison: [REDACTED] 1414

CLINICAL DATA: OG tube placement

EXAM:
PORTABLE ABDOMEN - 1 VIEW

[abdomen kub]
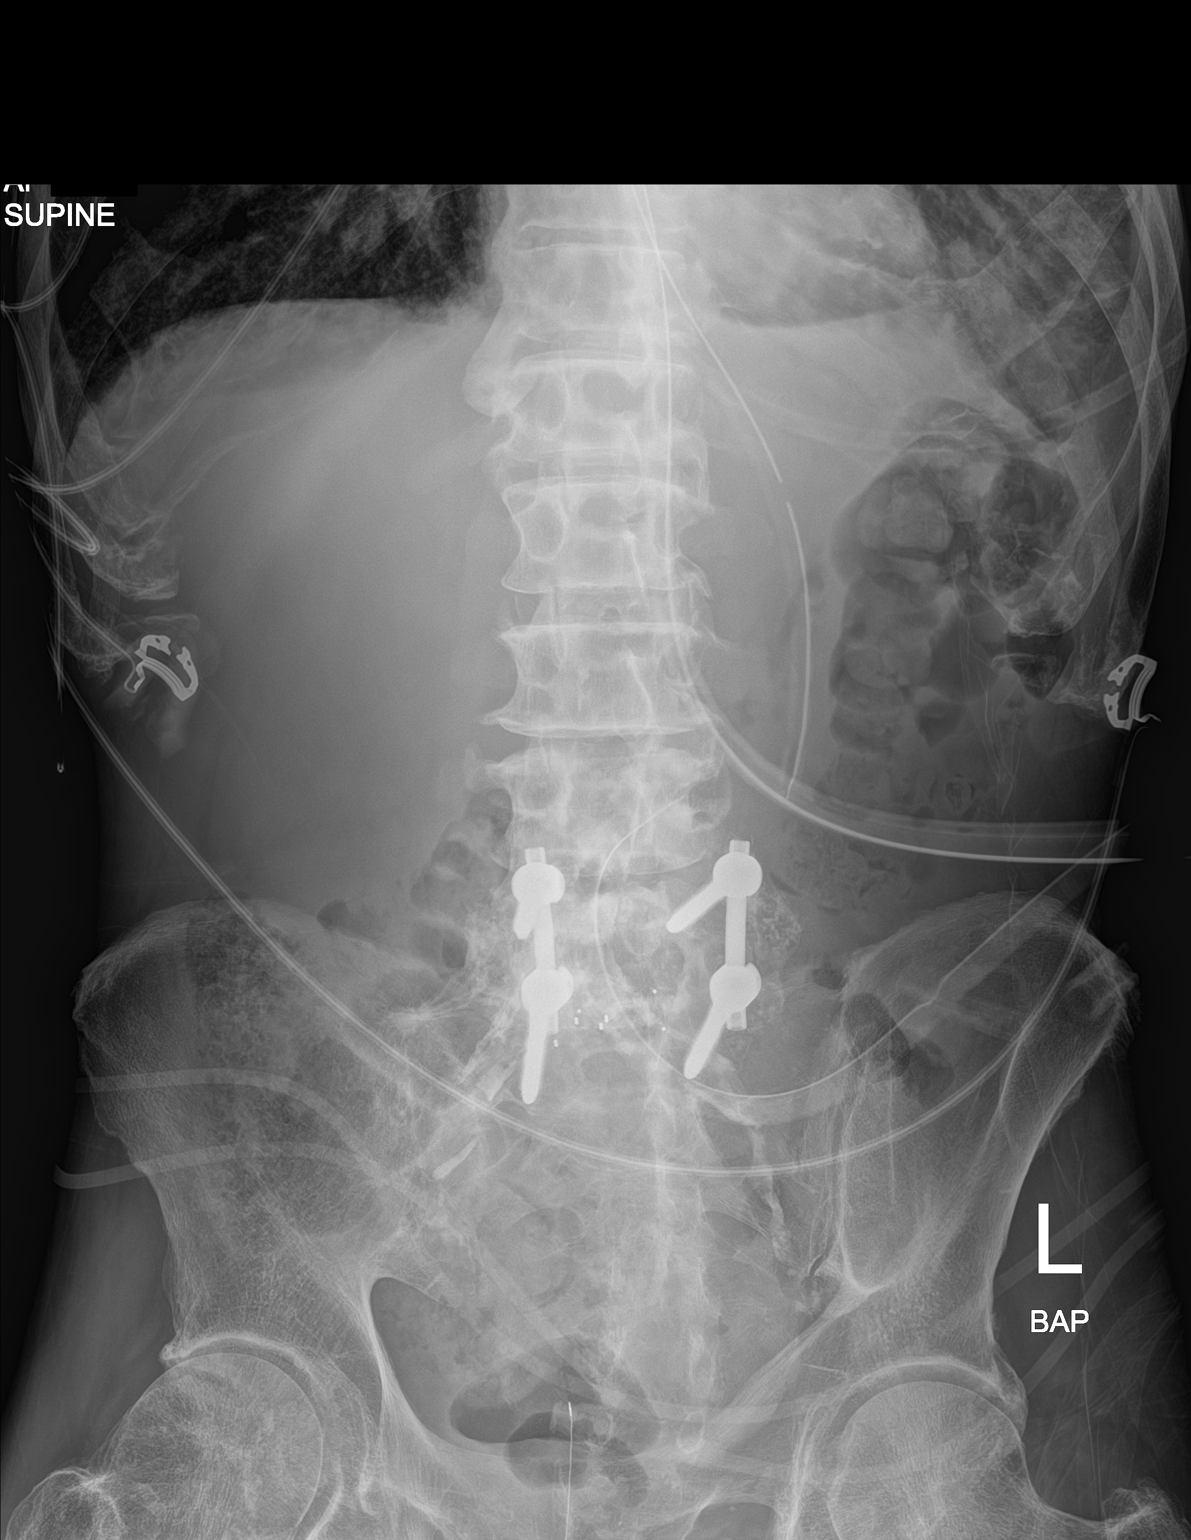

[1 of 1 positions shown; findings below may reference images not displayed]

FINDINGS: The enteric tube projects over the gastric body. A gastrostomy tube
projects over the mid abdomen. There is a large amount of stool
throughout the colon. The bowel gas pattern is nonspecific and
nonobstructive. The liver appears enlarged.
IMPRESSION: Enteric tube projects over the gastric body. A PEG tube projects
over the gastric body as well.
